# Patient Record
Sex: Female | Born: 1950 | Race: White | Marital: Single
Health system: Northeastern US, Academic
[De-identification: ages and names within clinical notes are randomized; demographics above are authoritative.]

## PROBLEM LIST (undated history)

## (undated) DIAGNOSIS — M419 Scoliosis, unspecified: Secondary | ICD-10-CM

## (undated) DIAGNOSIS — G629 Polyneuropathy, unspecified: Secondary | ICD-10-CM

## (undated) DIAGNOSIS — J45909 Unspecified asthma, uncomplicated: Secondary | ICD-10-CM

## (undated) DIAGNOSIS — J439 Emphysema, unspecified: Secondary | ICD-10-CM

## (undated) DIAGNOSIS — J449 Chronic obstructive pulmonary disease, unspecified: Secondary | ICD-10-CM

## (undated) DIAGNOSIS — F319 Bipolar disorder, unspecified: Secondary | ICD-10-CM

## (undated) HISTORY — DX: Chronic obstructive pulmonary disease, unspecified: J44.9

## (undated) HISTORY — PX: GALLBLADDER SURGERY: SHX652

## (undated) HISTORY — DX: Scoliosis, unspecified: M41.9

## (undated) HISTORY — PX: ABDOMINAL HYSTERECTOMY: SHX81

## (undated) HISTORY — PX: BACK SURGERY: SHX140

## (undated) HISTORY — DX: Polyneuropathy, unspecified: G62.9

## (undated) HISTORY — DX: Unspecified asthma, uncomplicated: J45.909

## (undated) HISTORY — DX: Bipolar disorder, unspecified: F31.9

## (undated) HISTORY — PX: GASTRIC BYPASS: SHX52

## (undated) HISTORY — PX: OTHER SURGICAL HISTORY: SHX169

## (undated) HISTORY — PX: BREAST REDUCTION SURGERY: SHX8

## (undated) HISTORY — DX: Emphysema, unspecified: J43.9

---

## 2015-10-16 NOTE — Progress Notes (Signed)
* * *        **Leah [name]**    ------    82 Y old Female, DOB: 11-30-1950    Account Number: [id number]    [address] [place], [id number]    Home: [phone]    Guarantor: Leah [name] Insurance: [facility] Complete-[employer]    Appointment Facility: Nolon Bussing DO        * * *    10/16/2015    Progress Notes: Cherre Robins, D.O.     ------    ---        Reason for Appointment     ---      1. Rash Upper and Lowerr extremities    ---      History of Present Illness    ---    [facility] Pre-Visit Prep_ :    Questions for Pre-visit Prep:    Hospitalized or ER visit since last appt? _No_    Other physicians seen since last appt? _No_    _Constitutional_ :    Leah [name] presents today for a follow up. She had blood work done for today's  visit. Patient complains of itching all over body. She believes that it has  been occurring since she has been on Jordan. She states that the itching is  all over her body, but mostly her arms though. Patient has no other concerns  or complaints today.      Current Medications    ---    Taking     * Abilify 15 MG Tablet 1 TAB Orally daily     ---    * Furosemide 80 MG Tablet 1 tablet Orally on Monday and Friday     ---    * Vitamin D 1000 UNIT Tablet TAKE 1 TABLET BY MOUTH EVERY DAY     ---    * Atenolol 50 MG Tablet TAKE 1 TABLET BY MOUTH TWICE A DAY     ---    * Amlodipine Besylate 10 MG Tablet TAKE 1 TABLET BY MOUTH DAILY     ---    * Tizanidine HCl 4 MG Tablet TAKE 1 TABLET BY MOUTH 3-4 TIMES DAILY     ---    * Klonopin 1 MG Tablet 2.5 tablet Orally Once a day     ---    * Ferrous Gluconate 325 (36 Fe) MG Tablet 1 tablet Orally Twice a day     ---    * Amitriptyline HCl 100 MG Tablet TAKE 2 TABLETS BY MOUTH AT BEDTIME     ---    * Clobetasol Propionate 0.05 % Cream 1 application to affected area Externally Twice a day, stop date 12/04/2015     ---    * Tizanidine HCl 4 MG Tablet 1 tablet as needed Orally Three to four  times a day, stop date 01/01/2016     ---    * Clonazepam 1 MG Tablet TAKE 1 TABLET BY MOUTH THREE TIMES A DAY FOR 14 DAYS     ---    * Lisinopril 40 MG Tablet TAKE 1 TABLET DAILY ORAL     ---    * Furosemide 40 MG Tablet 1 tablet Orally five days per week     ---    * Cymbalta 60 MG Capsule Delayed Release Particles TAKE ONE CAPSULE BY MOUTH EVERY DAY     ---    * HydrOXYzine HCl 25 MG Tablet 1  tablet as needed Orally every 8 hrs     ---    * Breo Ellipta 100-25 MCG/INH Aerosol Powder Breath Activated 1 puff Inhalation Once a day, stop date 01/30/2016     ---    * Oxycodone HCl 10 MG Tablet 1 tablet Orally Twice a day, stop date 11/05/2015     ---    * Latuda 40 MG Tablet 1 tablet with food Orally Once a day     ---    Not-Taking    * Pravastatin Sodium 40MG  1 TAB ORAL daily     ---    * Omeprazole 20MG  Tablet Delayed Release 1 TAB ORAL twice daily     ---    * Albuterol Sulfate (2.5 MG/3ML) 0.083% Nebulization Solution 3 ml Inhalation Three times a day     ---    * Advair Diskus 500-50 MCG/DOSE Aerosol Powder Breath Activated INHALE 1 PUFF TWICE A DAY     ---    * Abilify 10mg  tablet TAKE 1 TABLET BY MOUTH ONCE A DAY     ---    * Spiriva HandiHaler 18 MCG 1 CAP Inhalation daily     ---    * Aripiprazole 15 MG Tablet TAKE 1 TABLET BY MOUTH EVERY DAY     ---    * Zanaflex 4MG  1 TAB ORAL 2-3 times daily     ---    Discontinued    * Combivent 103-18 1INH INHALE four times daily     ---    Unknown    * Klonopin 1 MG Tablet 1 tablet Orally Three a day     ---    * Medication List reviewed and reconciled with the patient     ---      Past Medical History    ---      COPD.        ---    Bipolar.        ---    Substance abuse.        ---    Encephalopthy.        ---    Nondependent tobacco use disorder.        ---    Other and unspecified hyperlipidemia.        ---    Essential hypertension, benign.        ---    Lumbago.        ---    Esophageal reflux.        ---    Dietary surveillance and counseling.        ---     Exercise counseling.        ---    Other counseling, not elsewhere classified.        ---    Overweight.        ---    Hypertension.        ---    Hyperlipidemia.        ---    Other abnormal glucose.        ---    Fatigue.        ---    Vitamin D deficiency.        ---    Personal history of tobacco use, presenting hazards to health.        ---    COPD-Stable.        ---    Cough.        ---  Cough.        ---      Surgical History    ---      No Surgical History documented.    ---      Family History    ---      Father: deceased, lung cancer    ---    Mother: deceased, bone cancer    ---    1 brother(s) , 1 sister(s) - healthy.    ---    husband passed 10/02/14-heart attack 53yo.    ---      Social History    ---    _Quality 2017:_    Tobacco Use/Smoking Are you a: current smoker , How often do you smoke  cigarettes?: some days, but not every day, How many cigarettes a day do you  smoke?: 11-20, How soon after you wake up do you smoke your first cigarette?:  within 5 minutes, Are you interested in quitting?: Thinking about quitting.    Depression Screening (PHQ-2) Little interest or pleasure in doing things: Yes,  Feeling down, depressed, or hopeless: Yes.    Depression Evaluation (PHQ-9) Little interest or pleasure in doing things:  Nearly every day , Feeling down, depressed, or hopeless: Nearly every day,  Trouble falling or staying asleep, or sleeping too much: Nearly every day,  Feeling tired or having little energy: Nearly every day, Poor appetite or  overeating: Several days, Feeling bad about yourself, or that you are a  failure, or have let yourself or your family down: Not at all, Trouble  concentrating on things, such as reading the newspaper or watching television:  Nearly every day, Moving or speaking so slowly that other people could have  noticed. Or the opposite, being so fidgety or restless that you are moving  around a lot more than usual : Not at all, Thoughts that you would be  better  off dead, or of hurting yourself in some way: Not at all, Total Score:: 16,  Interpretation:: Moderately severe depression.    _Miscellaneous:_    Caffeine: yes, frequency:, more than 4 cups per day.    no Do you smoke Marijuana?.    Children: yes.    Community involvements: yes.    no Exercise.    Housing: renting.    Natural support system: relies on family.    Occupational exposure: none.    Others at home: none.    Pets: none.    no Sexually active.    no Travel outside of the [place].      Allergies    ---      N.K.D.A.    ---      Hospitalization/Major Diagnostic Procedure    ---      Lurlean Nanny - fall, altered mental status injury of head 09/17/2014    ---      Review of Systems    ---    _General/Constitutional_ :    Patient denies  change in appetite  ,  chills  ,  fever  ,  headache  ,  lightheadedness  ,  weight loss  . Patient complaining of  fatigue  ,  sleep  disturbance  ,  weight gain  .    _Ophthalmologic_ :    Patient denies  blurred vision  ,  diminished visual acuity  ,  discharge  ,  dry eye  ,  eye pain  ,  flashes of light in the visual field  ,  floaters in  the visual field  ,  itching and redness  ,  red eye  .    _ENT_ :    Patient denies  blocked ear(s)  ,  decreased hearing  ,  decreased sense of  smell  ,  difficulty swallowing  ,  dry mouth  ,  ear pain, snoring,  sore  throat  ,  swollen glands  .    _Endocrine_ :    Patient denies  acne  ,  cold intolerance  ,  difficulty sleeping  ,  dizziness  ,  excessive sweating  ,  excessive thirst  ,  frequent urination  ,  hair loss  ,  heat intolerance  ,  weakness  ,  weight loss  .    _Respiratory_ :    Patient denies  asthma, breathing problems  ,  chest pain  ,  cough  ,  hemoptysis  ,  pain with inspiration  ,  shortness of breath  ,  shortness of  breath at rest  ,  shortness of breath with exertion  ,  sputum production  ,  wheezing  .    _Cardiovascular_ :    Patient denies  chest pain at rest  ,  chest pain  with exertion  ,  claudication  ,  cyanosis  ,  difficulty laying flat  ,  dizziness  ,  dyspnea  on exertion  ,  fluid accumulation in the legs  ,  irregular heartbeat  ,  orthopnea  ,  palpitations  ,  rheumatic fever  ,  swelling in hands/feet  ,  weakness  ,  weight gain  .    _Gastrointestinal_ :    Patient denies  abdominal pain  ,  blood in stool  ,  change in bowel habits  ,  constipation  ,  decreased appetite  ,  diarrhea  ,  difficulty swallowing  ,  heartburn  ,  nausea  ,  rectal bleeding  ,  vomiting  ,  weight loss  .    _Genitourinary_ :    Patient denies  abdominal pain/swelling  ,  blood in the urine  ,  difficulty  urinating  ,  frequent urination  ,  pain in lower back  ,  painful urination  .    _Skin_ :    Patient denies  acne  ,  eczema  ,  excessive sun exposure  ,  hair changes  ,  masses  ,  mole(s)  ,  nail changes  ,  nodule(s)  ,  photosensitivity  ,  rash  ,  scaly lesions of skin/scalp  ,  skin lesion(s)  ,  skin oozing  .  Patient complaining of  blistering of skin  ,  discoloration  ,  dry skin  ,  hives  ,  itching  .    _Neurologic_ :    Patient denies  difficulty speaking  ,  dizziness  ,  fainting  ,  gait  abnormality  ,  headache  ,  irritability  ,  loss of strength  ,  loss of use  of extremity  ,  low back pain  ,  memory loss  ,  pain  ,  paralysis  ,  seizures  ,  stroke  ,  tics  ,  transient loss of vision  ,  tremor  .  Patient complaining  of  balance difficulty  ,  tingling/numbness  .    _Psychiatric_ :    Patient denies  difficulty sleeping  ,  loss of appetite  ,  stressors  ,  substance abuse  ,  suicidal thoughts  . Patient complaining of  anxiety  ,  depressed mood  .          Vital Signs    ---    Ht 61 in, Temp  100.4 F  , BP 102/58 mm Hg, HR  59 /min  .      Examination    ---    Denton Meek Examination_ :    NECK/THYROID:  normal  ,  carotid pulse normal  ,  neck supple, full range of  motion  ,  no carotid bruit  ,  no cervical lymphadenopathy  ,  no  jugular  venous distention  ,  no lymphadenopathy  ,  no thyroid nodules  ,  no  thyromegaly  ,  normal carotid upstroke  ,  normal jugular venous distention  ,  thyroid nontender  ,  thyroid normal  ,  trachea midline  .    SKIN:  Itchiness over body, but cellulitis occured on left arm, laterally,  from scratching  .    HEART:  normal  ,  no clicks  ,  no jugular venous distention  ,  no murmurs,  rubs, gallops  ,  point of maximal impulse normal  ,  regular rate and rhythm  .    LUNGS:  normal  ,  clear anteriorly and posteriorly  ,  clear to auscultation  bilaterally  ,  good air movement  ,  no wheezes, rales, rhonchi  .    CHEST:  normal  ,  axillary nodes normal  ,  clear to auscultation and  percussion  ,  no costochondral tenderness  ,  no gross rib deformity  ,  normal anteroposterior (AP) diameter  ,  normal shape and expansion  .    EXTREMITIES:  no edema  ,  normal. right upper lateral leg pain and numbness  in the back. pain down bilateral legs to the ankles posteriorly.  .    On exam, the blood pressure was 94/60 on the right arm.          Assessments    ---    1. Hypertension - I10 (Primary)    ---    2. Hyperlipidemia - E78.5    ---    3. Fe deficiency anemia - D50.9    ---    4. B12 deficiency - E53.8    ---    5. Hyperglycemia - R73.9    ---    6. Cellulitis, unspecified cellulitis site - L03.90    ---    7. Lumbago with sciatica, left side - M54.42    ---    8. Lumbago with sciatica, right side - M54.41    ---    9. Pain in right lower leg - M79.661    ---    10. Pain in left lower leg - M79.662    ---    11. History of tobacco use - Z87.891    ---    12. Screening for colon cancer - Z12.11    ---    13. History of back surgery - Z98.890    ---    14. Hx: bad fall - Z91.81    ---  15. Mood disorder - F39    ---      Treatment    ---      **1. Hypertension**    _LAB: B12_    _LAB: CBC_    _LAB: COMPREHENSIVE METABOLIC PANEL_    _LAB: FE_    _LAB: HEMOGLOBIN A1C_    _LAB: LIPID PROFILE_     _LAB: TSH_    _LAB: VITAMIN D (25 HYDROXY)_    Notes: Blood pressure is low today and has been on the lower end for several  visits. I have advised patient to cut her Lisinopril dose in half. We will  recheck blood pressure in one month.    The CBC is stable. The WBC is good. The platelets are good. No anemia.    The Kidney functions, Electrolytes, and Liver functions are good.    ---        **2. Hyperlipidemia**    _LAB: B12_    _LAB: CBC_    _LAB: COMPREHENSIVE METABOLIC PANEL_    _LAB: FE_    _LAB: HEMOGLOBIN A1C_    _LAB: LIPID PROFILE_    _LAB: TSH_    _LAB: VITAMIN D (25 HYDROXY)_    Notes: The Total Cholesterol is high at 211 (should be &lt;200), the  Triglycerides are high at 284 (should be &lt;150), the HDL is good at 48  (should be &gt;40), and the LDL is good at 106 (should be &lt;130).  Patient had stopped taking the Pravastatin, which is possibly why the  cholesterol levels are out of range. She will restart taking the Pravastatin.  She has been advised to have low fat/cholesterol in diet and do regular  exercise in order to improve the cholesterol.        **3. Fe deficiency anemia**    _LAB: B12_    _LAB: CBC_    _LAB: COMPREHENSIVE METABOLIC PANEL_    _LAB: FE_    _LAB: HEMOGLOBIN A1C_    _LAB: LIPID PROFILE_    _LAB: TSH_    _LAB: VITAMIN D (25 HYDROXY)_    Notes: The Fe is good at 108 (should be 50-170). She is taking Ferrous  Gluconate to help with the Fe deficiency anemia.        **4. B12 deficiency**    _LAB: B12_    _LAB: CBC_    _LAB: COMPREHENSIVE METABOLIC PANEL_    _LAB: FE_    _LAB: HEMOGLOBIN A1C_    _LAB: LIPID PROFILE_    _LAB: TSH_    _LAB: VITAMIN D (25 HYDROXY)_    Notes: The B12 level is a good at 707 (should be 213-816).        **5. Hyperglycemia**    _LAB: B12_    _LAB: CBC_    _LAB: COMPREHENSIVE METABOLIC PANEL_    _LAB: FE_    _LAB: HEMOGLOBIN A1C_    _LAB: LIPID PROFILE_    _LAB: TSH_    _LAB: VITAMIN D (25 HYDROXY)_    Notes: The fasting glucose is 113 and the A1c is 5.1%.  Patient has been  advised to continue watching sugar/carbohydrate in diet and to do regular  exercise to keep the blood glucose levels under control in order to prevent  the onset of Diabetes.        **6. Cellulitis, unspecified cellulitis site**    Start Keflex Capsule, 250 MG, 1 capsule, Orally, four times a day, 07 days,  28, Refills 0    Start Bactroban Ointment, 2 %, 1 application to affected  area, Externally,  Three times a day, 30 days, 30, Refills 1    Notes: Due to the itching all over her body, especially on her arms, since  taking the Latuda, patient seems to have some cellulitis on her left arm. She  will start taking Keflex 250mg  QID for 7 days to clear the infection. She has  also been given the bactroban ointment to apply externally to the area.        **7. Lumbago with sciatica, left side**    _IMAGING: MRI -LUMBAR SPINE [facility]_    _IMAGING: EMG Bilateral Legs_    Notes: Patient will get an MRI of Lumbar Spine and EMG of bilateral legs in  order to assess the low back pain radiating in to both legs.        **8. Lumbago with sciatica, right side**    _IMAGING: MRI -LUMBAR SPINE [facility]_    _IMAGING: EMG Bilateral Legs_    Notes: Patient will get an MRI of Lumbar Spine and EMG of bilateral legs in  order to assess the low back pain radiating in to both legs.        **9. Pain in right lower leg**    _IMAGING: MRI -LUMBAR SPINE [facility]_    _IMAGING: EMG Bilateral Legs_        **10. Pain in left lower leg**    _IMAGING: MRI -LUMBAR SPINE [facility]_    _IMAGING: EMG Bilateral Legs_        **11. History of tobacco use**    _IMAGING: CT -LUNG SCREENING LOW DOSE_    Notes: Patient will get a screening CT of Lungs due to history of smoking.        **12. Screening for colon cancer**    _IMAGING: Colonoscopy_    Notes: Patient is due to get a colonoscopy.        **13. History of back surgery**    _IMAGING: MRI -LUMBAR SPINE [facility]_    _IMAGING: EMG Bilateral Legs_        **14. Hx: bad fall**    _IMAGING: MRI -LUMBAR SPINE [facility]_     _IMAGING: EMG Bilateral Legs_        **15. Mood disorder**    Notes: If it is in fact the Latuda causing the itchiness, we will consider  switching her to Lamictal or Trileptal.        **16. Others**    Notes: The TSH is good at 2.9 (should be 0.35-4.94)    The Vitamin D level is low at 23.8 (should be 32-100). Patient is already  taking 1000IU of Vitamin D, but she has been advised to increase it to 3000IU  daily.    No changes have been made to patients current medications    Patient has been given a lab slip to get blood work done before her next visit  in one month.      Follow Up    ---    4 Weeks    Electronically signed by Nolon Bussing , DO on 02/20/2016 at 09:08 PM  CDT    Sign off status: Completed        * * *        Nolon Bussing DO    [address]    [place], [place] [id number]    Tel: [phone]    Fax: [phone]              * * *  Patient: Leah [name], Leah [name] DOB: 03/20/1951 Progress Note: Cherre Robins, D.O. 10/16/2015    ---    Note generated by eClinicalWorks EMR/PM Software ([email or web address])

## 2015-12-11 ENCOUNTER — Ambulatory Visit

## 2015-12-11 LAB — HX PSC EKG LAB: CASE NUMBER: 2017159000852

## 2016-01-02 NOTE — Progress Notes (Signed)
* * *        **Leah [name]**    ------    65 Y old Female, DOB: 04-05-1951    Account Number: [id number]    [address] [place], [id number]    Home: [phone]    Guarantor: Leah [name] Insurance: [facility] Complete-[employer]    Appointment Facility: Nolon Bussing DO        * * *    01/02/2016    Progress Notes: Cherre Robins, D.O.     ------    ---        Reason for Appointment     ---      1. 1 month f/u    ---      History of Present Illness    ---    _Constitutional_ :    Leah [name] presents today for a follow up. She states that her mood has been  better. She is on Latuda 60mg  QD and on 50mg  of Amitriptyline, and is off the  Abilify. She also mentions that she has no vision in the right eye still from  her fall. Patient has no other concerns or complaints today.      Current Medications    ---    Taking     * Furosemide 80 MG Tablet 1 tablet Orally on Monday and Friday     ---    * Vitamin D 1000 UNIT Tablet TAKE 1 TABLET BY MOUTH EVERY DAY     ---    * Amlodipine Besylate 10 MG Tablet TAKE 1 TABLET BY MOUTH DAILY     ---    * Klonopin 1 MG Tablet 2.5 tablet Orally Once a day     ---    * Clonazepam 1 MG Tablet TAKE 1 TABLET BY MOUTH THREE TIMES A DAY FOR 14 DAYS     ---    * Lisinopril 40 MG Tablet TAKE 1 TABLET DAILY ORAL     ---    * Cymbalta 60 MG Capsule Delayed Release Particles TAKE ONE CAPSULE BY MOUTH EVERY DAY     ---    * Breo Ellipta 100-25 MCG/INH Aerosol Powder Breath Activated 1 puff Inhalation Once a day, stop date 01/30/2016     ---    * Latuda 60 MG Tablet 2 tablets with food 1 40 mg and 1 20 mg = 60 mg Orally Once a day     ---    * Ferrous Gluconate 325 (36 Fe) MG Tablet 1 tablet Orally Twice a day     ---    * Amitriptyline HCl 50 MG Tablet TAKE 2 TABLETS BY MOUTH AT BEDTIME Orally     ---    * Atenolol 50 MG Tablet TAKE 1 TABLET BY MOUTH TWICE A DAY     ---    * HydrOXYzine HCl 25 MG Tablet 1 tablet as needed Orally every 8  hrs     ---    * Furosemide 40 MG Tablet TAKE 1 TABLET BY MOUTH DAILY     ---    * Omeprazole 20 MG Capsule Delayed Release TAKE ONE CAPSULE BY MOUTH TWICE A DAY     ---    * Oxycodone HCl 10 MG Tablet 1 tablet Orally Twice a day, stop date 01/03/2016     ---    * Tizanidine HCl 4 MG Tablet 1 tablet as needed Orally Three to four times a  day     ---    Not-Taking    * Abilify 15 MG Tablet 1 TAB Orally daily     ---    * Pravastatin Sodium 40MG  1 TAB ORAL daily     ---    * Omeprazole 20MG  Tablet Delayed Release 1 TAB ORAL twice daily     ---    * Albuterol Sulfate (2.5 MG/3ML) 0.083% Nebulization Solution 3 ml Inhalation Three times a day     ---    * Advair Diskus 500-50 MCG/DOSE Aerosol Powder Breath Activated INHALE 1 PUFF TWICE A DAY     ---    * Abilify 10mg  tablet TAKE 1 TABLET BY MOUTH ONCE A DAY     ---    * Spiriva HandiHaler 18 MCG 1 CAP Inhalation daily     ---    * Aripiprazole 15 MG Tablet TAKE 1 TABLET BY MOUTH EVERY DAY     ---    * Zanaflex 4MG  1 TAB ORAL 2-3 times daily     ---    Unknown    * Klonopin 1 MG Tablet 1 tablet Orally Three a day     ---    * Medication List reviewed and reconciled with the patient     ---      Past Medical History    ---      COPD.        ---    Bipolar.        ---    Substance abuse.        ---    Encephalopthy.        ---    Nondependent tobacco use disorder.        ---    Other and unspecified hyperlipidemia.        ---    Essential hypertension, benign.        ---    Lumbago.        ---    Esophageal reflux.        ---    Dietary surveillance and counseling.        ---    Exercise counseling.        ---    Other counseling, not elsewhere classified.        ---    Overweight.        ---    Hypertension.        ---    Hyperlipidemia.        ---    Other abnormal glucose.        ---    Fatigue.        ---    Vitamin D deficiency.        ---    Personal history of tobacco use, presenting hazards to health.        ---    COPD-Stable.        ---    Cough.        ---     Cough.        ---      Surgical History    ---      No Surgical History documented.    ---      Family History    ---      Father: deceased, lung cancer    ---    Mother: deceased, bone cancer    ---    1 brother(s) , 1 sister(s) - healthy.    ---  husband passed 10/02/14-heart attack 53yo.    ---      Social History    ---    _Quality 2017:_    Tobacco Use/Smoking Are you a: current smoker , How often do you smoke  cigarettes?: some days, but not every day, How many cigarettes a day do you  smoke?: 11-20, How soon after you wake up do you smoke your first cigarette?:  within 5 minutes, Are you interested in quitting?: Thinking about quitting.    Depression Screening (PHQ-2) Little interest or pleasure in doing things: Yes,  Feeling down, depressed, or hopeless: Yes.    Depression Evaluation (PHQ-9) Little interest or pleasure in doing things:  Nearly every day , Feeling down, depressed, or hopeless: Nearly every day,  Trouble falling or staying asleep, or sleeping too much: Nearly every day,  Feeling tired or having little energy: Nearly every day, Poor appetite or  overeating: Several days, Feeling bad about yourself, or that you are a  failure, or have let yourself or your family down: Not at all, Trouble  concentrating on things, such as reading the newspaper or watching television:  Nearly every day, Moving or speaking so slowly that other people could have  noticed. Or the opposite, being so fidgety or restless that you are moving  around a lot more than usual : Not at all, Thoughts that you would be better  off dead, or of hurting yourself in some way: Not at all, Total Score:: 16,  Interpretation:: Moderately severe depression.      Allergies    ---      N.K.D.A.    ---      Hospitalization/Major Diagnostic Procedure    ---      Lurlean Nanny - fall, altered mental status injury of head 09/17/2014    ---      Review of Systems    ---    _General/Constitutional_ :    Patient denies  change in  appetite  ,  chills  ,  fatigue  ,  fever  ,  headache  ,  lightheadedness  ,  sleep disturbance  ,  weight gain  ,  weight  loss  .    _Ophthalmologic_ :    Patient denies  blurred vision  ,  diminished visual acuity  ,  discharge  ,  dry eye  ,  eye pain  ,  flashes of light in the visual field  ,  floaters in  the visual field  ,  itching and redness  ,  red eye  .    _ENT_ :    Patient denies  blocked ear(s)  ,  decreased hearing  ,  decreased sense of  smell  ,  difficulty swallowing  ,  dry mouth  ,  ear pain, snoring,  sore  throat  ,  swollen glands  .    _Endocrine_ :    Patient denies  acne  ,  cold intolerance  ,  difficulty sleeping  ,  dizziness  ,  excessive sweating  ,  excessive thirst  ,  frequent urination  ,  hair loss  ,  heat intolerance  ,  weakness  ,  weight loss  .    _Respiratory_ :    Patient denies  asthma, breathing problems  ,  chest pain  ,  cough  ,  hemoptysis  ,  pain with inspiration  ,  shortness of breath  ,  shortness of  breath at rest  ,  shortness of breath with exertion  ,  sputum production  ,  wheezing  .    _Cardiovascular_ :    Patient denies  chest pain at rest  ,  chest pain with exertion  ,  claudication  ,  cyanosis  ,  difficulty laying flat  ,  dizziness  ,  dyspnea  on exertion  ,  fluid accumulation in the legs  ,  irregular heartbeat  ,  orthopnea  ,  palpitations  ,  rheumatic fever  ,  swelling in hands/feet  ,  weakness  ,  weight gain  .    _Gastrointestinal_ :    Patient denies  abdominal pain  ,  blood in stool  ,  change in bowel habits  ,  constipation  ,  decreased appetite  ,  diarrhea  ,  difficulty swallowing  ,  heartburn  ,  nausea  ,  rectal bleeding  ,  vomiting  ,  weight loss  .    _Genitourinary_ :    Patient denies  abdominal pain/swelling  ,  blood in the urine  ,  difficulty  urinating  ,  frequent urination  ,  pain in lower back  ,  painful urination  .    _Skin_ :    Patient denies  acne  ,  blistering of skin  ,  discoloration  ,  dry  skin  ,  eczema  ,  excessive sun exposure  ,  hair changes  ,  hives  ,  itching  ,  masses  ,  mole(s)  ,  nail changes  ,  nodule(s)  ,  photosensitivity  ,  rash  ,  scaly lesions of skin/scalp  ,  skin lesion(s)  ,  skin oozing  .    _Neurologic_ :    Patient denies  balance difficulty  ,  difficulty speaking  ,  dizziness  ,  fainting  ,  gait abnormality  ,  headache  ,  irritability  ,  loss of  strength  ,  loss of use of extremity  ,  low back pain  ,  memory loss  ,  pain  ,  paralysis  ,  seizures  ,  stroke  ,  tics  ,  tingling/numbness  ,  transient loss of vision  ,  tremor  .    _Psychiatric_ :    Patient denies  anxiety  ,  depressed mood  ,  difficulty sleeping  ,  loss of  appetite  ,  stressors  ,  substance abuse  ,  suicidal thoughts  .          Vital Signs    ---    Ht 61 in, Temp 97.9 F, BP 104/58 mm Hg, HR 67 /min, Oxygen sat % 98 %.      Examination    ---    _General Examination_ :    NECK/THYROID:  normal  ,  carotid pulse normal  ,  neck supple, full range of  motion  ,  no carotid bruit  ,  no cervical lymphadenopathy  ,  no jugular  venous distention  ,  no lymphadenopathy  ,  no thyroid nodules  ,  no  thyromegaly  ,  normal carotid upstroke  ,  normal jugular venous distention  ,  thyroid nontender  ,  thyroid normal  ,  trachea midline  .    HEART:  normal other than 1-2/6 murmur  ,  no clicks  ,  no jugular venous  distention  ,  no rubs, gallops  ,  point of maximal impulse normal  ,  regular rate and rhythm  .    LUNGS:  normal  ,  clear anteriorly and posteriorly  ,  clear to auscultation  bilaterally  ,  good air movement  ,  no wheezes, rales, rhonchi  .    CHEST:  normal  ,  axillary nodes normal  ,  clear to auscultation and  percussion  ,  no costochondral tenderness  ,  no gross rib deformity  ,  normal anteroposterior (AP) diameter  ,  normal shape and expansion  .    EXTREMITIES:  no edema  ,  normal  .    On exam, the blood pressure was 118/66 on the right arm.           Assessments    ---    1. Hypertension - I10 (Primary)    ---    2. Vision loss, right eye - H54.61    ---    3. Mood disorder - F39    ---      Treatment    ---      **1. Hypertension**    Notes: Blood pressure is good and within normal range.    ---        **2. Vision loss, right eye**    _IMAGING: Echocardiogram_    _IMAGING: VASCULAR -BILATERAL CAROTID WITH S/A_    _IMAGING: EKG_    Notes: Patient had an appointment with ophthalmology. They believe it could be  her retinal nerve has severed when she fell. Patient will get a carotid  ultrasound, echocardiogram, and EKG to assess the vision loss.        **3. Mood disorder**    Notes: Patients mood has been good on the Latuda and Amitriptyline. She is off  of the Abilify.        **4. Others**    Start Oxycodone HCl Tablet, 10 MG, 1 tablet, Orally, Twice a day, 30 days, 60,  Refills 0      Follow Up    ---    4 Weeks    Electronically signed by Nolon Bussing , DO on 02/20/2016 at 09:18 PM  CDT    Sign off status: Completed        * * *        Nolon Bussing DO    [address]    [place], [place] [id number]    Tel: [phone]    Fax: [phone]              * * *          Patient: Leah [name], Leah [name] DOB: [birth year] Progress Note: Cherre Robins, D.O. 01/02/2016    ---    Note generated by eClinicalWorks EMR/PM Software ([email or web address])

## 2016-02-13 NOTE — Progress Notes (Signed)
* * *        **Leah [name]**    ------    65 Y old Female, DOB: 10-Oct-1950    Account Number: [id number]    [address] [place], [id number]    Home: [phone]    Guarantor: Leah [name] Insurance: Susa Simmonds Medicare Complete-[employer]    Appointment Facility: Nolon Bussing DO        * * *    02/13/2016    Progress Notes: Cherre Robins, D.O.     ------    ---        **Reason for Appointment**    ---      1. 1 month f/u    ---      **History of Present Illness**    ---    _Constitutional_ :    Leah [name] presents today for a one-month follow up. She was not able to have any  blood work done for today's visit. Patient states that her mood has been  better on the [place]. She would like to slightly increase the dose though.  Other than that, she complains of feeling fatigued. Patient also complains of  having bilateral foot pain. She has no other concerns or complaints today.      **Current Medications**    ---    Taking     * Furosemide 80 MG Tablet 1 tablet Orally on Monday and Friday     ---    * Vitamin D 1000 UNIT Tablet TAKE 1 TABLET BY MOUTH EVERY DAY     ---    * Amlodipine Besylate 10 MG Tablet TAKE 1 TABLET BY MOUTH DAILY     ---    * Klonopin 1 MG Tablet 1 tab in AM, 0.5 tab in Noon, and 1 tab in PM Orally daily     ---    * Latuda 80 MG Tablet 1 tablet Orally Once a day     ---    * Ferrous Gluconate 325 (36 Fe) MG Tablet 1 tablet Orally Twice a day     ---    * Amitriptyline HCl 50 MG Tablet 1 tablet Orally     ---    * Omeprazole 20 MG Capsule Delayed Release TAKE ONE CAPSULE BY MOUTH TWICE A DAY     ---    * Tizanidine HCl 4 MG Tablet 1 tablet as needed Orally Three to four times a day     ---    * HydrOXYzine HCl 25 MG Tablet 1 tablet as needed Orally every 8 hrs     ---    * Atenolol 50 MG Tablet 1 tablet Orally Twice a day     ---    * Lisinopril 40 MG Tablet TAKE 1 TABLET DAILY ORAL     ---    * Cymbalta 60 MG Capsule Delayed Release Particles  TAKE ONE CAPSULE BY MOUTH EVERY DAY     ---    * Oxycodone HCl 10 MG Tablet 1 tablet Orally Twice a day, stop date 02/28/2016     ---    Not-Taking    * Abilify 15 MG Tablet 1 TAB Orally daily     ---    * Pravastatin Sodium 40MG  1 TAB ORAL daily     ---    * Omeprazole 20MG  Tablet Delayed Release 1 TAB ORAL twice daily     ---    * Albuterol Sulfate (2.5 MG/3ML)  0.083% Nebulization Solution 3 ml Inhalation Three times a day     ---    * Advair Diskus 500-50 MCG/DOSE Aerosol Powder Breath Activated INHALE 1 PUFF TWICE A DAY     ---    * Abilify 10mg  tablet TAKE 1 TABLET BY MOUTH ONCE A DAY     ---    * Spiriva HandiHaler 18 MCG 1 CAP Inhalation daily     ---    * Aripiprazole 15 MG Tablet TAKE 1 TABLET BY MOUTH EVERY DAY     ---    * Zanaflex 4MG  1 TAB ORAL 2-3 times daily     ---    Discontinued    * Breo Ellipta 100-25 MCG/INH Aerosol Powder Breath Activated 1 puff Inhalation Once a day     ---    Unknown    * Klonopin 1 MG Tablet 1 tablet Orally Three a day     ---    * Medication List reviewed and reconciled with the patient     ---      **Past Medical History**    ---      COPD.        ---    Bipolar.        ---    Substance abuse.        ---    Encephalopthy.        ---    Nondependent tobacco use disorder.        ---    Other and unspecified hyperlipidemia.        ---    Essential hypertension, benign.        ---    Lumbago.        ---    Esophageal reflux.        ---    Dietary surveillance and counseling.        ---    Exercise counseling.        ---    Other counseling, not elsewhere classified.        ---    Overweight.        ---    Hypertension.        ---    Hyperlipidemia.        ---    Other abnormal glucose.        ---    Fatigue.        ---    Vitamin D deficiency.        ---    Personal history of tobacco use, presenting hazards to health.        ---    COPD-Stable.        ---    Cough.        ---    Cough.        ---      **Surgical History**    ---      No Surgical History documented.    ---       **Family History**    ---      Father: deceased, lung cancer    ---    Mother: deceased, bone cancer    ---    1 brother(s) , 1 sister(s) - healthy.    ---    husband passed 10/02/14-heart attack 53yo.    ---      **Social History**    ---    _Quality 2017:_    Tobacco Use/Smoking    Are you a  _current smoker_    How often do you smoke cigarettes? _some days, but not every day_    How many cigarettes a day do you smoke? _11-20_    How soon after you wake up do you smoke your first cigarette? _within 5  minutes_    Are you interested in quitting? _Thinking about quitting_    Depression Evaluation (PHQ-9)    Little interest or pleasure in doing things _Nearly every day_    Feeling down, depressed, or hopeless _Nearly every day_    Trouble falling or staying asleep, or sleeping too much _Nearly every day_    Feeling tired or having little energy _Nearly every day_    Poor appetite or overeating _Several days_    Feeling bad about yourself, or that you are a failure, or have let yourself or  your family down _Not at all_    Trouble concentrating on things, such as reading the newspaper or watching  television _Nearly every day_    Moving or speaking so slowly that other people could have noticed. Or the  opposite, being so fidgety or restless that you are moving around a lot more  than usual _Not at all_    Thoughts that you would be better off dead, or of hurting yourself in some way  _Not at all_    Total Score: _16_    Interpretation: _Moderately severe depression_    Depression Screening (PHQ-2)    Little interest or pleasure in doing things _Yes_    Feeling down, depressed, or hopeless _Yes_      **Allergies**    ---      N.K.D.A.    ---      **Hospitalization/Major Diagnostic Procedure**    ---      [facility] - fall, altered mental status injury of head 09/17/2014    ---      **Review of Systems**    ---    _General/Constitutional_ :    Patient denies  change in appetite  ,  chills  ,  fatigue  ,   fever  ,  headache  ,  lightheadedness  ,  sleep disturbance  ,  weight gain  ,  weight  loss  .    _Ophthalmologic_ :    Patient denies  blurred vision  ,  diminished visual acuity  ,  discharge  ,  dry eye  ,  eye pain  ,  flashes of light in the visual field  ,  floaters in  the visual field  ,  itching and redness  ,  red eye  .    _ENT_ :    Patient denies  blocked ear(s)  ,  decreased hearing  ,  decreased sense of  smell  ,  difficulty swallowing  ,  dry mouth  ,  ear pain, snoring,  sore  throat  ,  swollen glands  .    _Endocrine_ :    Patient denies  acne  ,  cold intolerance  ,  difficulty sleeping  ,  dizziness  ,  excessive sweating  ,  excessive thirst  ,  frequent urination  ,  hair loss  ,  heat intolerance  ,  weakness  ,  weight loss  .    _Respiratory_ :    Patient denies  asthma, breathing problems  ,  chest pain  ,  cough  ,  hemoptysis  ,  pain with inspiration  ,  shortness of breath  ,  shortness of  breath at rest  ,  shortness of breath with exertion  ,  sputum production  ,  wheezing  .    _Cardiovascular_ :    Patient denies  chest pain at rest  ,  chest pain with exertion  ,  claudication  ,  cyanosis  ,  difficulty laying flat  ,  dizziness  ,  dyspnea  on exertion  ,  fluid accumulation in the legs  ,  irregular heartbeat  ,  orthopnea  ,  palpitations  ,  rheumatic fever  ,  swelling in hands/feet  ,  weakness  ,  weight gain  .    _Gastrointestinal_ :    Patient denies  abdominal pain  ,  blood in stool  ,  change in bowel habits  ,  constipation  ,  decreased appetite  ,  diarrhea  ,  difficulty swallowing  ,  heartburn  ,  nausea  ,  rectal bleeding  ,  vomiting  ,  weight loss  .    _Genitourinary_ :    Patient denies  abdominal pain/swelling  ,  blood in the urine  ,  difficulty  urinating  ,  frequent urination  ,  pain in lower back  ,  painful urination  .    _Skin_ :    Patient denies  acne  ,  blistering of skin  ,  discoloration  ,  dry skin  ,  eczema  ,  excessive sun  exposure  ,  hair changes  ,  hives  ,  itching  ,  masses  ,  mole(s)  ,  nail changes  ,  nodule(s)  ,  photosensitivity  ,  rash  ,  scaly lesions of skin/scalp  ,  skin lesion(s)  ,  skin oozing  .    _Neurologic_ :    Patient denies  balance difficulty  ,  difficulty speaking  ,  dizziness  ,  fainting  ,  gait abnormality  ,  headache  ,  irritability  ,  loss of  strength  ,  loss of use of extremity  ,  low back pain  ,  memory loss  ,  pain  ,  paralysis  ,  seizures  ,  stroke  ,  tics  ,  tingling/numbness  ,  transient loss of vision  ,  tremor  .    _Psychiatric_ :    Patient denies  anxiety  ,  depressed mood  ,  difficulty sleeping  ,  loss of  appetite  ,  stressors  ,  substance abuse  ,  suicidal thoughts  .          **Vital Signs**    ---    Ht 61 in, BP 128/82 mm Hg, HR 73 /min, Oxygen sat % 98 %, Ht-cm 154.94 cm.      **Examination**    ---    _General Examination_ :    NECK/THYROID:  normal  ,  carotid pulse normal  ,  neck supple, full range of  motion  ,  no carotid bruit  ,  no cervical lymphadenopathy  ,  no jugular  venous distention  ,  no lymphadenopathy  ,  no thyroid nodules  ,  no  thyromegaly  ,  normal carotid upstroke  ,  normal jugular venous distention  ,  thyroid nontender  ,  thyroid normal  ,  trachea midline  .    SKIN:  black and blue areas from bruising all over arm  .    HEART:  normal other than 1-2/6 murmur  ,  no clicks  ,  no jugular venous  distention  ,  no rubs, gallops  ,  point of maximal impulse normal  ,  regular rate and rhythm  .    LUNGS:  normal  ,  clear anteriorly and posteriorly  ,  clear to auscultation  bilaterally  ,  good air movement  ,  no wheezes, rales, rhonchi  .    CHEST:  normal  ,  axillary nodes normal  ,  clear to auscultation and  percussion  ,  no costochondral tenderness  ,  no gross rib deformity  ,  normal anteroposterior (AP) diameter  ,  normal shape and expansion  .    EXTREMITIES:  no edema  ,  normal  .          **Assessments**     ---    1. Hypertension - I10 (Primary)    ---    2. Hyperlipidemia - E78.5    ---    3. Fe deficiency anemia - D50.9    ---    4. B12 deficiency - E53.8    ---    5. Hyperglycemia - R73.9    ---    6. Vitamin D deficiency, unspecified - E55.9    ---    7. Fatigue, unspecified type - R53.83    ---    8. Snoring - R06.83    ---    9. Easy bruising - R23.8    ---    10. Transaminasemia - R74.0    ---    11. Mood disorder - F39    ---      **Treatment**    ---      **1. Hypertension**    _LAB: Prothrombin Time with INR (PT/INR)_    _LAB: B12_    _LAB: CBC_    _LAB: COMPREHENSIVE METABOLIC PANEL_    _LAB: FE_    _LAB: HEPATITIS B SURF ANTIGEN_    _LAB: HEPATITIS C_    _LAB: HEMOGLOBIN A1C_    _LAB: LIPID PROFILE_    _LAB: TSH_    _LAB: VITAMIN D (25 HYDROXY)_    Notes:  Blood pressure is good and within normal range.    ---        **2. Hyperlipidemia**    _LAB: Prothrombin Time with INR (PT/INR)_    _LAB: B12_    _LAB: CBC_    _LAB: COMPREHENSIVE METABOLIC PANEL_    _LAB: FE_    _LAB: HEPATITIS B SURF ANTIGEN_    _LAB: HEPATITIS C_    _LAB: HEMOGLOBIN A1C_    _LAB: LIPID PROFILE_    _LAB: TSH_    _LAB: VITAMIN D (25 HYDROXY)_        **3. Fe deficiency anemia**    _LAB: Prothrombin Time with INR (PT/INR)_    _LAB: B12_    _LAB: CBC_    _LAB: COMPREHENSIVE METABOLIC PANEL_    _LAB: FE_    _LAB: HEPATITIS B SURF ANTIGEN_    _LAB: HEPATITIS C_    _LAB: HEMOGLOBIN A1C_    _LAB: LIPID PROFILE_    _LAB: TSH_    _LAB: VITAMIN D (25 HYDROXY)_        **4. B12 deficiency**    _LAB:  Prothrombin Time with INR (PT/INR)_    _LAB: B12_    _LAB: CBC_    _LAB: COMPREHENSIVE METABOLIC PANEL_    _LAB: FE_    _LAB: HEPATITIS B SURF ANTIGEN_    _LAB: HEPATITIS C_    _LAB: HEMOGLOBIN A1C_    _LAB: LIPID PROFILE_    _LAB: TSH_    _LAB: VITAMIN D (25 HYDROXY)_        **5. Hyperglycemia**    _LAB: Prothrombin Time with INR (PT/INR)_    _LAB: B12_    _LAB: CBC_    _LAB: COMPREHENSIVE METABOLIC PANEL_    _LAB: FE_    _LAB: HEPATITIS B SURF  ANTIGEN_    _LAB: HEPATITIS C_    _LAB: HEMOGLOBIN A1C_    _LAB: LIPID PROFILE_    _LAB: TSH_    _LAB: VITAMIN D (25 HYDROXY)_    Notes:  Patient has been advised to continue watching sugar/carbohydrate in  diet and to do regular exercise to keep the blood glucose levels under  control.  .        **6. Vitamin D deficiency, unspecified**    _LAB: Prothrombin Time with INR (PT/INR)_    _LAB: B12_    _LAB: CBC_    _LAB: COMPREHENSIVE METABOLIC PANEL_    _LAB: FE_    _LAB: HEPATITIS B SURF ANTIGEN_    _LAB: HEPATITIS C_    _LAB: HEMOGLOBIN A1C_    _LAB: LIPID PROFILE_    _LAB: TSH_    _LAB: VITAMIN D (25 HYDROXY)_        **7. Fatigue, unspecified type**    _LAB: Prothrombin Time with INR (PT/INR)_    _LAB: B12_    _LAB: CBC_    _LAB: COMPREHENSIVE METABOLIC PANEL_    _LAB: FE_    _LAB: HEPATITIS B SURF ANTIGEN_    _LAB: HEPATITIS C_    _LAB: HEMOGLOBIN A1C_    _LAB: LIPID PROFILE_    _LAB: TSH_    _LAB: VITAMIN D (25 HYDROXY)_    _IMAGING: Sleep Study_    Notes:  Patient has been given a lab slip to get some blood work done to  assess her fatigue. She will also get a sleep study to assess this too.  .        **8. Snoring**    _IMAGING: Sleep Study_    Notes:  Patient will get a sleep study to assess her snoring and r/o possible  sleep apnea  .        **9. Easy bruising**    _LAB: Prothrombin Time with INR (PT/INR)_    _LAB: B12_    _LAB: CBC_    _LAB: COMPREHENSIVE METABOLIC PANEL_    _LAB: FE_    _LAB: HEPATITIS B SURF ANTIGEN_    _LAB: HEPATITIS C_    _LAB: HEMOGLOBIN A1C_    _LAB: LIPID PROFILE_    _LAB: TSH_    _LAB: VITAMIN D (25 HYDROXY)_    _IMAGING: Ultrasound : Abdomen_    Notes:  Patient has a lot of bruising on both of her arms. She has been given  a lab slip to get some blood work done to assess this. In addition, she will  get an abdominal ultrasound to assess this.  .        **10. Transaminasemia**    _IMAGING: Ultrasound : Abdomen_    Notes:  Patient will be getting an abdominal ultrasound to assess her  easy  bruising. This will also look at her liver to assess her  transaminasema.  .        **11. Mood disorder**    Notes:  Patient's mood has been good. The Kasandra Knudsen is working for her. However,  she would like to increase the dose slightly for a better mood. Therefore, she  will increase the dose from 80mg  to 100mg .  .        **12. 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 We  will continue to monitor this.        Patient has been given a lab slip to get routine blood work done    .      **Follow Up**    ---    4 Weeks    Electronically signed by Nolon Bussing , DO on 01/08/2018 at 12:39 PM  CDT    Sign off status: Completed        * * *        Nolon Bussing DO    [address]    [place], [place] [id number]    Tel: [phone]    Fax: [phone]              * * *          Patient: [place], [place] DOB: December 16, 1950 Progress Note: Cherre Robins, D.O. 02/13/2016    ---    Note generated by eClinicalWorks EMR/PM Software ([email or web address])

## 2016-06-15 ENCOUNTER — Ambulatory Visit: Admitting: Emergency Medicine

## 2016-06-15 ENCOUNTER — Inpatient Hospital Stay
Admit: 2016-06-15 | Disposition: A | Discharge status: Home Health | Source: Home / Self Care | Attending: Internal Medicine | Admitting: Internal Medicine

## 2016-06-15 LAB — HX COMPREHENSIVE METABOLIC PANEL
CASE NUMBER: 2017346001301
HX ALBUMIN LVL: 2.2 g/dL — ABNORMAL LOW (ref 3.5–5.0)
HX ALKALINE PHOSPHATASE: 234 U/L — ABNORMAL HIGH (ref 45.0–117.0)
HX ALT: 41 U/L — NL (ref 6.0–78.0)
HX ANION GAP: 10 — NL (ref 3.0–11.0)
HX AST: 34 U/L — NL (ref 6.0–40.0)
HX BILIRUBIN TOTAL: 1.1 mg/dL — NL (ref 0.2–1.2)
HX BUN: 32 mg/dL — ABNORMAL HIGH (ref 7.0–18.0)
HX CALCIUM LVL: 7.8 mg/dL — ABNORMAL LOW (ref 8.5–10.1)
HX CHLORIDE: 96 mmol/L — ABNORMAL LOW (ref 98.0–110.0)
HX CO2: 32 mmol/L — NL (ref 21.0–32.0)
HX CREATININE: 2.32 mg/dL — ABNORMAL HIGH (ref 0.55–1.3)
HX GLUCOSE LVL: 123 mg/dL — ABNORMAL HIGH (ref 65.0–110.0)
HX POTASSIUM LVL: 4.3 mmol/L — NL (ref 3.6–5.2)
HX SODIUM LVL: 138 mmol/L — NL (ref 136.0–145.0)
HX TOTAL PROTEIN: 5.5 g/dL — ABNORMAL LOW (ref 6.0–8.0)

## 2016-06-15 LAB — HX CBC W/ DIFF
CASE NUMBER: 2017346001301
HX HCT: 22.8 % — ABNORMAL LOW (ref 36.0–47.0)
HX HGB: 7.6 g/dL — ABNORMAL LOW (ref 11.8–15.8)
HX MCH: 37 pg — ABNORMAL HIGH (ref 27.0–31.0)
HX MCHC: 33.3 g/dL — NL (ref 32.0–36.0)
HX MCV: 111 fL — ABNORMAL HIGH (ref 81.0–99.0)
HX MPV: 9.2 fL — NL (ref 7.4–11.5)
HX NRBC PERCENT: 0 % — NL
HX PLATELET: 263 10*3/uL — NL (ref 150.0–400.0)
HX RBC: 2.06 10*6/uL — ABNORMAL LOW (ref 3.6–5.0)
HX RDW: 14.9 % — ABNORMAL HIGH (ref 11.5–14.5)
HX WBC: 6.8 10*3/uL — NL (ref 3.7–11.2)

## 2016-06-15 LAB — HX GLOMERULAR FILTRATION RATE (ESTIMATED)
CASE NUMBER: 2017346001301
HX AFN AMER GLOMERULAR FILTRATION RATE: 25 mL/min/{1.73_m2}
HX NON-AFN AMER GLOMERULAR FILTRATION RATE: 22 mL/min/{1.73_m2}

## 2016-06-15 LAB — HX C-REACTIVE PROTEIN (CRP)
CASE NUMBER: 2017346001301
HX C-REACTIVE PROTEIN: 3.86 mg/dL — ABNORMAL HIGH (ref 0.0–0.8)

## 2016-06-15 LAB — HX ZZANTIBODY SCREEN
CASE NUMBER: 2017346002789
HX ANTIBODY SCREEN: NEGATIVE — NL
HX G1: 0 — NL
HX G2: 0 — NL
HX G3: 0 — NL

## 2016-06-15 LAB — HX MAGNESIUM LEVEL
CASE NUMBER: 2017346001301
HX MAGNESIUM LVL: 2.6 mg/dL — ABNORMAL HIGH (ref 1.8–2.4)

## 2016-06-15 LAB — HX .AUTOMATED DIFF
CASE NUMBER: 2017346001301
HX ABSOLUTE NEUTRO COUNT: 4500 /mm3
HX BASOPHILS: 0 % — NL (ref 0.0–1.0)
HX EOSINOPHILS: 1 % — NL (ref 0.0–3.0)
HX IMMATURE GRANULOCYTES: 1 % — NL (ref 0.0–2.0)
HX LYMPHOCYTES: 19 % — ABNORMAL LOW (ref 22.0–40.0)
HX MONOCYTES: 13 % — ABNORMAL HIGH (ref 0.0–11.0)
HX NEU CT MEASURED: 4.5
HX NEUTROPHILS: 66 % — NL (ref 40.0–71.0)

## 2016-06-15 LAB — HX URINE DIPSTICK W/REFLEX
CASE NUMBER: 2017346001302
HX UA BILIRUBIN: NEGATIVE — NL
HX UA BLOOD: NEGATIVE — NL
HX UA GLUCOSE: NEGATIVE — NL
HX UA KETONES: NEGATIVE — NL
HX UA LEUKOCYTE ESTERASE: NEGATIVE — NL
HX UA NITRITE: NEGATIVE — NL
HX UA PH: 8 — NL (ref 5.0–8.0)
HX UA PROTEIN: NEGATIVE — NL
HX UA RBC: <1 — NL (ref 0.0–3.0)
HX UA SPECIFIC GRAVITY: 1.005 — NL (ref 1.005–1.03)
HX UA SQUAMOUS EPITHELIAL: 2 /HPF — NL (ref 0.0–5.0)
HX UA UROBILINOGEN: 4 — AB
HX UA WBC: <1 — NL (ref 0.0–5.0)

## 2016-06-15 LAB — HX .ABO/RH TYPE TUBE
CASE NUMBER: 2017346002789
HX A1: 0 — NL
HX ABORH: A POS — NL
HX ANTI-B: 0 — NL

## 2016-06-15 LAB — HX IRON PROFILE
CASE NUMBER: 2017346001301
HX % IRON BOUND: 92 % — ABNORMAL HIGH (ref 10.0–50.0)
HX FERRITIN LVL: 673 ng/mL — ABNORMAL HIGH (ref 8.0–252.0)
HX IRON: 129 ug/dL — NL (ref 50.0–170.0)
HX TIBC: 140 ug/dL — ABNORMAL LOW (ref 250.0–450.0)

## 2016-06-15 LAB — HX LACTIC ACID
CASE NUMBER: 2017346001301
CASE NUMBER: 2017346001545
HX LACTIC ACID LVL: 3.4 mmol/L — ABNORMAL HIGH (ref 0.4–2.0)
HX LACTIC ACID LVL: 2.1 mmol/L — ABNORMAL HIGH (ref 0.4–2.0)

## 2016-06-15 LAB — HX PT
CASE NUMBER: 2017346001301
HX INR: 1
HX PT: 11.1 s — NL (ref 9.3–11.6)

## 2016-06-15 NOTE — Progress Notes (Signed)
Progress Note        Patient seen and examined at bedside.    Labs , imaging, and notes reviewed.    Discussed case with RN        vomited after eating food yesterday. does not vomit with liquids.        12 systems examined. Pertinent positives per history of present illness. All  rest are negative            Temperature Oral: 98.8 DegF (06/17/16 08:00:00)    Temperature Oral: 98.8 DegF (06/17/16 04:13:00)    Temperature Oral: 98.2 DegF (06/16/16 23:35:00)    Temperature Oral: 99.2 DegF (06/16/16 19:55:00)    Peripheral Pulse Rate: 84 bpm (06/16/16 08:04:00)    Peripheral Pulse Rate: 76 bpm (06/15/16 19:33:00)    Peripheral Pulse Rate: 63 bpm (06/15/16 11:23:00)    Heart Rate Monitored: 86 bpm (06/17/16 10:00:00)    Heart Rate Monitored: 81 bpm (06/17/16 09:00:00)    Heart Rate Monitored: 82 bpm (06/17/16 08:00:00)    Heart Rate Monitored: 80 bpm (06/17/16 07:00:00)    Respiratory Rate: 20 br/min (06/17/16 10:00:00)    Respiratory Rate: 17 br/min (06/17/16 09:12:00)    Respiratory Rate: 14 br/min (06/17/16 09:10:00)    Respiratory Rate: 23 br/min (06/17/16 08:00:00)    Systolic Blood Pressure: 139 mmHg (06/17/16 09:00:00)    Systolic Blood Pressure: 115 mmHg (06/17/16 04:00:00)    Systolic Blood Pressure: 105 mmHg (06/16/16 23:35:00)    Systolic Blood Pressure: 126 mmHg (06/16/16 20:00:00)    Diastolic Blood Pressure: 88 mmHg (06/17/16 09:00:00)    Diastolic Blood Pressure: 80 mmHg (06/17/16 04:00:00)    Diastolic Blood Pressure: 69 mmHg (06/16/16 23:35:00)    Diastolic Blood Pressure: 59 mmHg (06/16/16 20:00:00)                    Gen: AOx3, in no distress    HEENT: PERLA, EOMI    Resp: normal breath sounds bilaterally    CV: normal S1, S2, no M/R/G    Abd: soft, ND, +BS, no tenderness, no guarding, no rebound tenderness    Extr: no edema    Neuro exam: non focal exam        Diagnostic Data    Labs    Labs (Last four charted values)    WBC 5.0 (DEC 14) 6.6 (DEC 13) 6.8 (DEC 12)    Hgb L 7.3 (DEC 14) L 7.9 (DEC  13) L 7.6 (DEC 12)    Hct L 22.3 (DEC 14) L 24.3 (DEC 13) L 22.8 (DEC 12)    Plt 219 (DEC 14) 244 (DEC 13) 263 (DEC 12)    Na 145 (DEC 14) 144 (DEC 13) 138 (DEC 12)    K 3.7 (DEC 14) 3.6 (DEC 13) 4.3 (DEC 12)    CO2 24 (DEC 14) 27 (DEC 13) 32 (DEC 12)    Cl H 112 (DEC 14) 108 (DEC 13) L 96 (DEC 12)    Cr 1.010 (DEC 14) H 1.620 (DEC 13) H 2.320 (DEC 12)    BUN 12 (DEC 14) H 24 (DEC 13) H 32 (DEC 12)    Glucose Random 97 (DEC 14) 103 (DEC 13) H 123 (DEC 12)    Mg 2.2 (DEC 14) H 2.5 (DEC 13) H 2.6 (DEC 12)    Phos 2.5 (DEC 14) 3.1 (DEC 13)    Ca L 7.6 (DEC 14) L 7.7 (DEC 13) L 7.8 (DEC 12)    PT 11.1 (  DEC 14) 10.7 (DEC 13) 11.1 (DEC 12)    INR 1.0 (DEC 14) 1.0 (DEC 13) 1.0 (DEC 12)    PTT 25.7 (DEC 14) 23.8 (DEC 13)            Creatinine 2.3    Magnesium 2.6    Lactic acid 3.4 down to 2.1    CRP 3.8        Hematology panel no white count    Hemoglobin 7.9        Imaging        CT head negative    Chest x-ray negative    Blood cultures pending        Microbiology        Vital Signs Trend        Afebrile, low-grade temperature noted MAXIMUM TEMPERATURE 99.7    Heartrate 83    121/95% on room air                Home Medications (17) Active    amitriptyline 50 mg, PO, HS    amLODIPine 10 mg, PO, Daily    atenolol 50 mg, PO, BID    Breo Ellipta 100 mcg-25 mcg/inh inhalation powder 1 puff(s), INHAL, Daily    clonazePAM 1 mg oral tablet 1 mg, PO, BID    Cymbalta 60 mg oral delayed release capsule 60 mg = 1 cap(s), PO, Daily    ferrous gluconate 325 mg (36 mg elemental iron) oral tablet 325 mg = 1 tab(s),  PO, Daily    furosemide 40 mg oral tablet See Instructions    furosemide 40 mg oral tablet 40 mg = 1 tab(s), PO, Daily    hydrOXYzine hydrochloride 25 mg oral tablet 25 mg = 1 tab(s), PRN, PO, TID    Latuda 120 mg, PO, Daily    lisinopril 40 mg, PO, Daily    omeprazole 20 mg, PO, BID    oxyCODONE 10 mg oral tablet 10 mg = 1 tab(s), PO, BID    Spiriva 18 mcg inhalation capsule 18 mcg = 1 cap(s), INHAL, Daily    Vitamin B12  250 mcg oral tablet 250 mcg = 1 tab(s), PO, Daily    Vitamin D3 1,000 Unit(s), PO, Daily            Medications (23) Active    Scheduled: (16)    amitriptyline 50 mg tab 50 mg 1 tab(s), PO, HS    budesonide 0.5 mg/2 ml neb 0.5 mg 2 mL, NEB, BID    clonazePAM 1 mg tab 1 mg 1 tab(s), PO, BID    cyanocobalamin 100 mcg tab 250 mcg 2.5 tab(s), PO, Daily    doxycycline 100 mg tab 100 mg 1 tab(s), PO, BIDAC    DULoxetine 30 mg cap 60 mg 2 cap(s), PO, Daily    ferrous sulfate 325mg  ec tab 325 mg 1 tab(s), PO, Daily    formoterol 20 mcg/2 mL neb 20 mcg 2 mL, NEB, BID    lactobacillus rhamnosus cap 2 cap(s), PO, Daily    lurasidone 20 mg Tab 120 mg 6 tab(s), PO, Daily    metoprolol tartrate 50 mg tab 50 mg 1 tab(s), PO, BID    oxyCODONE IR 5 mg tab 10 mg 2 tab(s), PO, BID    pantoprazole 40 mg tablet 40 mg 1 tab(s), PO, BID    sennosides 8.6 mg tab 17.2 mg 2 tab(s), PO, HS    tiotropium 18 mcg cap 18 mcg 1 cap(s), INHAL, Daily  vitamin D 1000 unit(s) 1,000 Unit(s) 1 tab(s), PO, Daily    Continuous: (1)    sodium chloride 0.9% 1,000 mL 1,000 mL, IV, 125 ml/hr    PRN: (6)    acetaminophen 325 mg tab 650 mg 2 tab(s), PO, q4hr    bisacodyl 10 mg suppository 10 mg 1 supp, PR, Daily    docusate 100 mg cap 100 mg 1 cap(s), PO, BID    hydrOXYzine hcl 25 mg tab 25 mg 1 tab(s), PO, TID    ondansetron 2mg /ml vial 2ml 4 mg 2 mL, IV Push, q8hr    traZODone 50 mg tab 25 mg 0.5 tab(s), PO, HS                                Impression :    This is a 65 year old female with history of bypass surgery and bipolar  disorder who presents with nausea vomiting found to be in acute renal failure  in the setting of low blood pressure.                Vomiting    Patient came in with a blood pressure in the systolic of 60s    In the setting of vomiting only with solid foods    This does not apply to liquid foods    Suspicion for gastroparesis or gastric outlet obstruction    Will call for GI consultation    Nuclear medicine gastric emptying study has  been ordered            Acute renal failure without anuria    Likely secondary to prerenal azotemia    Resolved        Electrolites imbalance    Secondary to acute renal failure        Anemia:    will need outpatient f/u    no sign of active bleed    will guaic stool.        Hypotension    and lactic acidosis    concern for sepisis cxr, ua contaminated. bcx pending.    will repeat lactate to resolution, improving overall.    Will check a urine toxicology in light of her medical history    patient on cefepime.    will repeat ua, concern for poss. gram neg. sepsis.            Fall-    - unclear etiology (blind right eye)    - polypharmacy mentioned in h&P    - falls have been more frequent over the last few years.    - pt /ot no sign sof focal weakness on exam.        hepatic steatosis    - incidental finding on kidney ultrasound- f/u outpatient.            pmhx:        1\. Bipolar disorder.    2\. COPD.    3\. Right visual field defect.    4\. History of falls.    5\. 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    6\. Recent admission with acute systolic congestive heart failure.    7\. Acute renal failure.    8\. Toxic metabolic encephalopathy.    9\. Acute-on-chronic anemia status post red blood cell transfusion.  Anemia    4\. Likely chronic disease. Will check stool for OB and iron profile. Will  keep on BIB PPI. no active GIB suspected. She has elevated MCV.                Cardiac Solid Diet (CCU Solid Diet) - Ordered    -- 06/15/16 21:00:00 EST, Room Service, Scheduled / PRN            Code Status - Ordered    -- 06/15/16 20:02:00 EST, Do Not Resuscitate, NO Intubation/Ventilation,  Constant Order        unable to discuss advance directives at this time.    SIGNATURE LINE Electronically signed by Hoy Register MD, Teyonna Plaisted on 06/17/2016 at  13:20:20 EST

## 2016-06-15 NOTE — Progress Notes (Signed)
Progress Note        Patient seen and examined at bedside.    Labs , imaging, and notes reviewed.    Discussed case with RN        doing well. eating her lunch at bedside.        12 systems examined. Pertinent positives per history of present illness. All  rest are negative            Temperature Oral: 98.5 DegF (06/16/16 08:00:00)    Temperature Oral: 99 DegF (06/16/16 04:35:00)    Temperature Oral: 98.8 DegF (06/16/16 00:00:00)    Temperature Oral: 99.7 DegF High (06/15/16 20:17:00)    Peripheral Pulse Rate: 76 bpm (06/15/16 19:33:00)    Peripheral Pulse Rate: 63 bpm (06/15/16 11:23:00)    Heart Rate Monitored: 79 bpm (06/16/16 06:00:00)    Heart Rate Monitored: 83 bpm (06/16/16 04:00:00)    Heart Rate Monitored: 78 bpm (06/16/16 02:00:00)    Heart Rate Monitored: 78 bpm (06/16/16 00:00:00)    Respiratory Rate: 22 br/min (06/16/16 04:00:00)    Respiratory Rate: 20 br/min (06/16/16 00:00:00)    Respiratory Rate: 16 br/min (06/15/16 20:40:00)    Respiratory Rate: 20 br/min (06/15/16 18:00:00)    Systolic Blood Pressure: 121 mmHg (06/16/16 04:00:00)    Systolic Blood Pressure: 115 mmHg (06/16/16 00:00:00)    Systolic Blood Pressure: 131 mmHg (06/15/16 19:33:00)    Systolic Blood Pressure: 136 mmHg (06/15/16 18:00:00)    Diastolic Blood Pressure: 71 mmHg (06/16/16 04:00:00)    Diastolic Blood Pressure: 80 mmHg (06/16/16 00:00:00)    Diastolic Blood Pressure: 95 mmHg High (06/15/16 19:33:00)    Diastolic Blood Pressure: 103 mmHg High (06/15/16 18:00:00)            Gen: AOx3, in no distress    HEENT: PERLA, EOMI    Resp: normal breath sounds bilaterally    CV: normal S1, S2, no M/R/G    Abd: soft, ND, +BS, no tenderness, no guarding, no rebound tenderness    Extr: no edema    Neuro exam: non focal exam        Diagnostic Data    Labs    Labs (Last four charted values)    WBC 6.6 (DEC 13) 6.8 (DEC 12)    Hgb L 7.9 (DEC 13) L 7.6 (DEC 12)    Hct L 24.3 (DEC 13) L 22.8 (DEC 12)    Plt 263 (DEC 12)    Na 138 (DEC 12)     K 4.3 (DEC 12)    CO2 32 (DEC 12)    Cl L 96 (DEC 12)    Cr H 2.320 (DEC 12)    BUN H 32 (DEC 12)    Glucose Random H 123 (DEC 12)    Mg H 2.6 (DEC 12)    Ca L 7.8 (DEC 12)    PT 10.7 (DEC 13) 11.1 (DEC 12)    INR 1.0 (DEC 13) 1.0 (DEC 12)    PTT 23.8 (DEC 13)            Creatinine 2.3    Magnesium 2.6    Lactic acid 3.4 down to 2.1    CRP 3.8        Hematology panel no white count    Hemoglobin 7.9        Imaging        CT head negative    Chest x-ray negative    Blood cultures pending  Microbiology        Vital Signs Trend        Afebrile, low-grade temperature noted MAXIMUM TEMPERATURE 99.7    Heartrate 83    121/95% on room air                Home Medications (17) Active    amitriptyline 50 mg, PO, HS    amLODIPine 10 mg, PO, Daily    atenolol 50 mg, PO, BID    Breo Ellipta 100 mcg-25 mcg/inh inhalation powder 1 puff(s), INHAL, Daily    clonazePAM 1 mg oral tablet 1 mg, PO, BID    Cymbalta 60 mg oral delayed release capsule 60 mg = 1 cap(s), PO, Daily    ferrous gluconate 325 mg (36 mg elemental iron) oral tablet 325 mg = 1 tab(s),  PO, Daily    furosemide 40 mg oral tablet See Instructions    furosemide 40 mg oral tablet 40 mg = 1 tab(s), PO, Daily    hydrOXYzine hydrochloride 25 mg oral tablet 25 mg = 1 tab(s), PRN, PO, TID    Latuda 120 mg, PO, Daily    lisinopril 40 mg, PO, Daily    omeprazole 20 mg, PO, BID    oxyCODONE 10 mg oral tablet 10 mg = 1 tab(s), PO, BID    Spiriva 18 mcg inhalation capsule 18 mcg = 1 cap(s), INHAL, Daily    Vitamin B12 250 mcg oral tablet 250 mcg = 1 tab(s), PO, Daily    Vitamin D3 1,000 Unit(s), PO, Daily            Medications (24) Active    Scheduled: (17)    amitriptyline 50 mg tab 50 mg 1 tab(s), PO, HS    budesonide 0.5 mg/2 ml neb 0.5 mg 2 mL, NEB, BID    cefepime in D5W duplex 1 gm/50 ml 1 Gm 50 mL, IV Piggyback, q12hr    clonazePAM 1 mg tab 1 mg 1 tab(s), PO, BID    cyanocobalamin 100 mcg tab 250 mcg 2.5 tab(s), PO, Daily    doxycycline 100 mg tab 100 mg 1 tab(s),  PO, BIDAC    DULoxetine 30 mg cap 60 mg 2 cap(s), PO, Daily    ferrous sulfate 325mg  ec tab 325 mg 1 tab(s), PO, Daily    formoterol 20 mcg/2 mL neb 20 mcg 2 mL, NEB, BID    lactobacillus rhamnosus cap 2 cap(s), PO, Daily    lurasidone 20 mg Tab 120 mg 6 tab(s), PO, Daily    metoprolol tartrate 50 mg tab 50 mg 1 tab(s), PO, BID    oxyCODONE IR 5 mg tab 10 mg 2 tab(s), PO, BID    pantoprazole 40 mg tablet 40 mg 1 tab(s), PO, BID    sennosides 8.6 mg tab 17.2 mg 2 tab(s), PO, HS    tiotropium 18 mcg cap 18 mcg 1 cap(s), INHAL, Daily    vitamin D 1000 unit(s) 1,000 Unit(s) 1 tab(s), PO, Daily    Continuous: (1)    sodium chloride 0.9% 1,000 mL 1,000 mL, IV, 125 ml/hr    PRN: (6)    acetaminophen 325 mg tab 650 mg 2 tab(s), PO, q4hr    bisacodyl 10 mg suppository 10 mg 1 supp, PR, Daily    docusate 100 mg cap 100 mg 1 cap(s), PO, BID    hydrOXYzine hcl 25 mg tab 25 mg 1 tab(s), PO, TID    ondansetron 2mg /ml vial 2ml 4 mg 2 mL, IV  Push, q8hr    traZODone 50 mg tab 25 mg 0.5 tab(s), PO, HS             This visit (24 hour periods starting at 07:00)    ------     06/16/16 *  06/15/16  06/14/16    ------------    Total Summary     ------    Intake mL  \--  4,020  \--    ------------    Output mL  \--  500  \--    ------------    Fluid Balance  \--  3,520  \--    ------------    Intake (6)     ------    Dextrose 5% in Water, piperacillin-tazobactam mL  \--  100  \--    ------------    Oral Intake mL  \--  120  \--    ------------    Pre-Arrival Fluid mL  \--  250  \--    ------------    Sodium Chloride 0.9% 1,000 mL mL  \--  3,000  \--    ------------    Sodium Chloride 0.9%, vancomycin mL  \--  500  \--    ------------    cefepime mL  \--  50  \--    ------------    Total  \--  4,020  \--    ------------    Output (1)     ------    Urine Voided mL  \--  500  \--    ------------    Total  \--  500  \--    ------------    Counts (4)      ------    Oral Intake mL  \--  120  \--    ------------    Pre-Arrival Fluid mL  \--  250  \--    ------------    Urine Count  \--  1  \--    ------------    Urine Voided mL  \--  500  \--    ------------        * This column has not completed the indicated time period.                 Impression :                Acute renal failure without anuria    Strict I's and O's    Patient is without anuria    Will check urine sodium and electrolytes    Will check hepatitis C    LDH    RPR    UA    urine protein and creatinine ratio    In addition will rule out obstruction with renal ultrasound    Hold lisinopril    Triage at this point unclear will attempt to rule out renal and postrenal and  prerenal causes    Than that the patient had relatively low blood pressures, prerenal etiology is  most likely        Please discontinue all medications that may be renal cleared        Hold nephrology consult if creatinine improving        Electrolites imbalance    Secondary to acute renal failure            Hypotension    and lactic acidosis    concern for sepisis cxr, ua contaminated. bcx pending.    will repeat lactate to resolution, improving overall.    Will check a  urine toxicology in light of her medical history    patient on cefepime.    will repeat ua, concern for poss. gram neg. sepsis.            Fall-    - unclear etiology,    - polypharmacy mentioned in h&P    - falls have been more frequent over the last few years.    - pt /ot no sign sof focal weakness on exam.        hepatic steatosis    - incidental finding on kidney ultrasound- f/u outpatient.            pmhx:        1\. Bipolar disorder.    2\. COPD.    3\. Right visual field defect.    4\. History of falls.    5\. 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    6\. Recent admission with acute systolic congestive heart failure.    7\. Acute renal failure.    8\. Toxic metabolic encephalopathy.    9\. Acute-on-chronic anemia  status post red blood cell transfusion.                                Anemia    4\. Likely chronic disease. Will check stool for OB and iron profile. Will  keep on BIB PPI. no active GIB suspected. She has elevated MCV.                Cardiac Solid Diet (CCU Solid Diet) - Ordered    -- 06/15/16 21:00:00 EST, Room Service, Scheduled / PRN            Code Status - Ordered    -- 06/15/16 20:02:00 EST, Do Not Resuscitate, NO Intubation/Ventilation,  Constant Order            declines discussion of advance directives at this time.      SIGNATURE LINE Electronically signed by Hoy Register MD, Mayer Vondrak on 06/16/2016 at  13:16:41 EST

## 2016-06-15 NOTE — Discharge Summary (Signed)
Date of Admission    06/15/2016    Date of Discharge    06/18/2016    Admission History    please see H&P for details:    65 year old with history of falls who reports falling at home yesterday. She  thinks her falls have been due to medications. She was able to call 911 using  her alert system.    She claims she does not walk with a walker or cane. Her falls have been going  on for the past 2 years. She did hit her head but without any noted fracture.  She had a negative CT head.    She does not have a fever and denies chills. She has a chronic cough and this  has not changed in character. [1]    Code Status    Code Status - Ordered    -- 06/15/16 20:02:00 EST, Do Not Resuscitate, NO Intubation/Ventilation,  Constant Order          Allergies    NKA    Social History    _Alcohol_    Current, Liquor, 1-2 times per week    _Tobacco_    Current every day smoker, Cigarettes    Hospital Course    65 year old female with history of bypass surgery and bipolar disorder who  presents with nausea vomiting found to be in acute renal failure in the  setting of low blood pressure and lactic acidosis. no acute infection found.  likely related with dehydration while on BP meds. BP improving, renal function  back to normal. lactic acidosis resolved. GI consulted. NM gastric emptying  study: Normal gastric emptying. pt felt abd pain improving, able to tolerated  PO. wanted to go home. suggested small meals and zofran prn for symptoms.      Procedures and Treatment Provided    CXR:    No focal airspace disease.    [2]        CT head:    No evidence of acute intracranial injury.    [3]        renal u/s:    No evidence of hydronephrosis. Elevated PVR.        Incidentally noted echogenic hepatic parenchyma suggesting steatosis.    [4]        NM gastric emptying study:    Normal gastric emptying.    [5]    Physical Exam    Vitals & Measurements    **T:** 98.8 F (Oral) **TMIN:** 98.6 F (Oral) **TMAX:** 99.5 F (Oral)  **HR:** 99  **RR:** 18 **BP:** 126/70 **SpO2:** 95% **O2 Method (L/min):** Room  air        Respiratory: Lungs are clear to auscultation, Breath sounds are equal.    Cardiovascular: Regular rate and rhythm.    Gastrointestinal: Soft, Non-tender, Non-distended    Neurologic: awake    Psychiatric: Cooperative      Lab Results    Glucose Lvl: 102 mg/dL (16/10/96 04:54:09)    BUN: 7 mg/dL (81/19/14 78:29:56)    Creatinine: 0.809 mg/dL (21/30/86 57:84:69)    Afn Amer Glomerular Filtration Rate: 89 ml/min/1.58m2 (06/18/16 10:04:12)    Non-Afn Amer Glomerular Filtration Rate: 77 ml/min/1.28m2 (06/18/16 10:04:14)    Sodium Lvl: 142 mmol/L (06/18/16 10:04:09)    Potassium Lvl: 3.5 mmol/L Low (06/18/16 10:04:09)    Chloride: 109 mmol/L (06/18/16 10:04:09)    CO2: 24 mmol/L (06/18/16 10:04:09)    Anion Gap: 9 (06/18/16 10:04:09)    Total Protein: 6.4 Gm/dL (62/95/28 41:32:44)  Albumin Lvl: 2.4 Gm/dL Low (16/10/96 04:54:09)    Calcium Lvl: 8 mg/dL Low (81/19/14 78:29:56)    Phosphorus: 1.8 mg/dL Low (21/30/86 57:84:69)    Magnesium Lvl: 2 mg/dL (62/95/28 41:32:44)    Bilirubin Total: 0.7 mg/dL (07/06/70 53:66:44)    Bilirubin Direct: 0.4 mg/dL High (03/47/42 59:56:38)    Alkaline Phosphatase: 198 Units/L High (06/18/16 10:04:11)    AST: 27 Units/L (06/18/16 10:04:11)    ALT: 31 Units/L (06/18/16 10:04:11)    NT-ProBNP: 20826 pGm/ml High (06/18/16 10:04:16)    WBC: 6.6 thous/mm3 (06/18/16 09:17:45)    RBC: 2.1 Mil/mm3 Low (06/18/16 09:17:45)    Hgb: 7.6 Gm/dL Low (75/64/33 29:51:88)    Hct: 23.6 % Low (06/18/16 09:17:45)    Platelet: 258 thous/mm3 (06/18/16 09:17:45)    MCV: 112 fL High (06/18/16 09:17:45)    MCH: 36 pGm High (06/18/16 09:17:45)    MCHC: 32.2 Gm/dL (41/66/06 30:16:01)    RDW: 15.1 % High (06/18/16 09:17:45)    MPV: 9 fL (06/18/16 09:17:45)    NRBC Percent: 0 % (06/18/16 09:17:45)    PT: 10.9 sec (06/18/16 09:25:09)    INR: 1 (06/18/16 09:25:09)    PTT: 25.5 sec (06/18/16 09:25:10)          Discharge  Diagnoses    Acidosis    Acute on chronic renal failure    Anemia    Hypotension    Ordered:    nausea and vomit    Discharge Medications    _Discharge_    amitriptyline, 50 mg, PO, HS    amLODIPine, 10 mg, PO, Daily    atenolol, 50 mg, PO, BID    Breo Ellipta 100 mcg-25 mcg/inh inhalation powder, 1 puff(s), INHAL, Daily    clonazePAM 1 mg oral tablet, 1 mg, PO, BID    Cymbalta 60 mg oral delayed release capsule, 60 mg= 1 cap(s), PO, Daily    ferrous gluconate 325 mg (36 mg elemental iron) oral tablet, 325 mg= 1 tab(s),  PO, Daily    furosemide 40 mg oral tablet, See Instructions    furosemide 40 mg oral tablet, 40 mg= 1 tab(s), PO, Daily    hydrOXYzine hydrochloride 25 mg oral tablet, 25 mg= 1 tab(s), PO, TID, PRN    Latuda, 120 mg, PO, Daily    lisinopril, 40 mg, PO, Daily    omeprazole, 20 mg, PO, BID    oxyCODONE 10 mg oral tablet, 10 mg= 1 tab(s), PO, BID    Spiriva 18 mcg inhalation capsule, 18 mcg= 1 cap(s), INHAL, Daily    Vitamin B12 250 mcg oral tablet, 250 mcg= 1 tab(s), PO, Daily    Vitamin D3, 1000 Unit(s), PO, Daily    Zofran 4 mg oral tablet, 4 mg= 1 tab(s), PO, q8hr, PRN    Discharge Instructions    DISCHARGE CONDITION: hemodynamically stable        DISPOSITION: Home        FOLLOW UP:    1\. f/u with PCP in 1-2 weeks    2\. f/u with GI as needed        DISCHARGE INSTRUCTION:        diet as tolerated, keep small meals. keep oral hydration        updated patient about hospital course and medication adjustment. showed  understanding        Total face to face contact during this discharge is more than 30 minutes        Please cc the discharge summary to  PCP        Face to Face Encounter: Based on my findings, the following services are  medically necessary home health services.    I had a face-to-face encounter with this patient during which a medical  condition was addressed which is the primary reason for home health care on:  06/18/2016    Based on my findings, the following services are medically  necessary home  health services: Nursing, Physical Therapy, Occupational Therapy, Speech  Therapy.        Skilled nursing service which includes assessment, teaching and intervention:  Wound Care, IV Therapy, Medication Management and Education, Disease  Management, Symptom Control, Ostomy Care and Teaching, Post-Surgical Care,  Catheter Care, Enteral Therapy Teaching, Injectable Medication.        Skilled Soil scientist which includes assessment, teaching and  intervention -PT/OT/ST: Therapeutic Exercise, Gait Training, ROM, Home  Exercise Program, Home Safety Evaluation, Assessment of DME and Adaptive  Equipment, Modalities [e.g., E Stim, Ultrasound], Pain and Symptom Management,  Orthopedic Rehabilitation Following Surgery, Speech and Swallowing Disorders,  Dysphagia Treatment, Fall Risk, Restore Patient Function.        Homebound (i.e. absences from home require considerable and taxing effort and  are for medical reason or religious services or infrequently or of short  duration when for other reasons): Unsafe Ambulation, Gait Disorder, Dyspnea  with Minimal Exertion, Low Vision, Uncontrolled Pain Interfering with  Activity, Frequent Falls, Unable to [employer], Use of Supportive Device  [Cane, Walker], Complex Wound, Psychiatric Symptoms, Cognitive Deficits,  Memory Impairments.        Based on the above findings, I certify that this patient is confined to the  home and needs intermittent skilled nursing care, physical therapy and /or  speech therapy or continues to need occupational therapy: The patient is under  my care, and I have initiated the establishment of the plan of care. This  patient will be followed by a physician who will periodically review the plan  of care.      [1] Admission H & P; Ngomba MD-HOSP, Evaristo Bury 06/15/2016 19:45 EST    [2] XR Chest 1 View Frontal; Routhier MD, Jill Alexanders 06/15/2016 11:32 EST    [3] CT Head or Brain C-; Hildred Laser MD, Joni Reining 06/15/2016 14:00 EST    [4] [place]  Retroperitoneal Complete; Kathaleen Bury MD, Kristopher 06/16/2016 10:42 EST    [5] NM Gastric Emptying Study; Margo Aye MD, Bruce 06/17/2016 14:25 EST    SIGNATURE LINE Electronically signed by Loel Dubonnet MD, Charlene Brooke on 06/18/2016 at  12:34:23 EST

## 2016-06-15 NOTE — H&P (Signed)
Chief Complaint    Fall, weakness    History of Present Illness    65 year old with history of falls who reports falling at home yesterday. She  thinks her falls have been due to medications. She was able to call 911 using  her alert system.    She claims she does not walk with a walker or cane. Her falls have been going  on for the past 2 years. She did hit her head but without any noted fracture.  She had a negative CT head.    She does not have a fever and denies chills. She has a chronic cough and this  has not changed in character.    Review of Systems    12 point review of system negative, except as in history of present illness  and past medical history.    Code Status    DNR/DNI    Physical Exam    Vitals & Measurements    **T:** 98.8 F (Oral) **TMIN:** 98.1 F (Oral) **TMAX:** 98.8 F (Oral)  **HR:** 80 **RR:** 20 **BP:** 131/95 **SpO2:** 97% **O2 Method (L/min):** Room  air **WT:** 90.71 Kg    General: Alert and oriented, no acute distress    Eye: PERRL, Normal Conjunctiva    HEENT: Normocephalic, Atraumatic, PERRLA, CN 2 to 12 Normal, ear canals  patent, no sinus tenderness    Neck: supple, nontender, carotid pulse WNL, no carotid bruit, no JVD, no  lymphadenopathy, no thyromegaly, full ROM    Respiratory: non-labored, BS equal and regular, symmetrical chest expansion,  no chest wall tenderness    Cardiovascular: Normal Rate, normal rhythm,  beats/minute, no gallop, good  pulses equal in all extremities, normal peripheral perfusion    Gastrointestinal: Abdomen soft, non tender, non-distended, normal bowel sounds  all four quadrants, no organomegaly    Genitourinary: No CVA tenderness    Musculoskeletal: normal ROM, normal strength, no tenderness, no swelling, no  deformity, normal gait    Integumentary: Skin intact, warm, pink, dry. No pallor, no rash.    Neurologic: Alert, Oriented,al balance    Psychiatric: Cooperative, appropriate mood & affect, normal judgment, non-  suicidal    Impression and  Plan    Acute on chronic renal failure    1\. She has hypotension and hypovolemia and took antihypertensive meds  including lisinopril which will be held.    fall    2\. may be medication SE. She has polypharmacy.    Hypotension    3\. resolved with fluid. We cannot rule out infection. BCx were sent and  emperic Antibiotic given. Her lactic acidosis is a concern, but this may be  due to hypovolemia. Will Give NS overnight. Hold Lasix and all BP meds except  B-Blocker which will be given with parameters. I doubt sepsis. No fever or  leukocytosis. will send a UA    Anemia    4\. Likely chronic disease. Will check stool for OB and iron profile. Will  keep on BIB PPI. no active GIB suspected. She has elevated MCV.    CMS Two-Midnight Rule    I certify that hospital inpatient services are reasonable and medically  necessary. They are appropriately provided as inpatient services in accordance  with the two midnight benchmark under 42CFR 412 3(e), or the services are  specified as inpatient only procedure under 42 CFR 419 22(n).          Problem List/Past Medical History    Falls    Anxiety disorder.  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    Social History    _Alcohol_    Current, Liquor, 1-2 times per week    _Tobacco_    Current every day smoker, Cigarettes    Family History    Family history is negative    Allergies    NKA    Medications    _Inpatient_    bolus NS 1,000 mL, 1000 mL, IV    bolus NS 1,000 mL, 1000 mL, IV    _Home_    amitriptyline, 50 mg, PO, HS    amLODIPine, 10 mg, PO, Daily    atenolol, 50 mg, PO, BID    Breo Ellipta 100 mcg-25 mcg/inh inhalation powder, 1 puff(s), INHAL, Daily    clonazePAM 1 mg oral tablet, 1 mg, PO, BID    Cymbalta 60 mg oral delayed release capsule, 60 mg= 1 cap(s), PO, Daily    ferrous gluconate 325 mg (36 mg elemental iron) oral tablet, 325 mg= 1 tab(s),  PO, Daily    furosemide 40 mg oral tablet, See Instructions    furosemide 40 mg  oral tablet, 40 mg= 1 tab(s), PO, Daily    hydrOXYzine hydrochloride 25 mg oral tablet, 25 mg= 1 tab(s), PO, TID, PRN    Latuda, 120 mg, PO, Daily    lisinopril, 40 mg, PO, Daily    omeprazole, 20 mg, PO, BID    oxyCODONE 10 mg oral tablet, 10 mg= 1 tab(s), PO, BID    Spiriva 18 mcg inhalation capsule, 18 mcg= 1 cap(s), INHAL, Daily    Vitamin B12 250 mcg oral tablet, 250 mcg= 1 tab(s), PO, Daily    Vitamin D3, 1000 Unit(s), PO, Daily    Diet    No qualifying data available.      Lab Results    Glucose Lvl: 123 mg/dL High (16/10/96 04:54:09)    BUN: 32 mg/dL High (81/19/14 78:29:56)    Creatinine: 2.32 mg/dL High (21/30/86 57:84:69)    Afn Amer Glomerular Filtration Rate: 25 ml/min/1.15m2 (06/15/16 12:20:22)    Non-Afn Amer Glomerular Filtration Rate: 22 ml/min/1.28m2 (06/15/16 12:20:24)    Sodium Lvl: 138 mmol/L (06/15/16 12:20:20)    Potassium Lvl: 4.3 mmol/L (06/15/16 12:20:20)    Chloride: 96 mmol/L Low (06/15/16 12:20:20)    CO2: 32 mmol/L (06/15/16 12:20:20)    Anion Gap: 10 (06/15/16 12:20:20)    Total Protein: 5.5 Gm/dL Low (62/95/28 41:32:44)    Albumin Lvl: 2.2 Gm/dL Low (07/06/70 53:66:44)    Calcium Lvl: 7.8 mg/dL Low (03/47/42 59:56:38)    Magnesium Lvl: 2.6 mg/dL High (75/64/33 29:51:88)    Bilirubin Total: 1.1 mg/dL (41/66/06 30:16:01)    Alkaline Phosphatase: 234 Units/L High (06/15/16 12:20:20)    AST: 34 Units/L (06/15/16 12:20:20)    ALT: 41 Units/L (06/15/16 12:20:20)    Lactic Acid Lvl: 2.1 mmol/L High (06/15/16 17:33:14)    C-Reactive Protein: 3.86 mg/dL High (09/32/35 57:32:20)    WBC: 6.8 thous/mm3 (06/15/16 11:49:21)    RBC: 2.06 Mil/mm3 Low (06/15/16 11:49:21)    Hgb: 7.6 Gm/dL Low (25/42/70 62:37:62)    Hct: 22.8 % Low (06/15/16 11:49:21)    Platelet: 263 thous/mm3 (06/15/16 11:49:21)    MCV: 111 fL High (06/15/16 11:49:21)    MCH: 37 pGm High (06/15/16 11:49:21)    MCHC: 33.3 Gm/dL (83/15/17 61:60:73)    RDW: 14.9 % High (06/15/16 11:49:21)    MPV: 9.2 fL (06/15/16 11:49:21)     Neutrophils: 66 % (  06/15/16 11:49:22)    Lymphocytes: 19 % Low (06/15/16 11:49:22)    Monocytes: 13 % High (06/15/16 11:49:22)    Eosinophils: 1 % (06/15/16 11:49:22)    Basophils: 0 % (06/15/16 11:49:22)    Immature Granulocytes: 1 % (06/15/16 11:49:22)    NRBC Percent: 0 % (06/15/16 11:49:21)    Absolute Neutro Count: 4500 /mm3 (06/15/16 11:49:22)    PT: 11.1 sec (06/15/16 11:52:51)    INR: 1 (06/15/16 11:52:51)    UA Color: Yellow1 (06/15/16 14:29:26)    UA Clarity: Clear1 (06/15/16 14:29:26)    UA Specific Gravity: 1.005 (06/15/16 14:29:26)    UA pH: 8 (06/15/16 14:29:26)    UA Protein: Negative1 (06/15/16 14:29:26)    UA Glucose: Negative1 (06/15/16 14:29:26)    UA Ketones: Negative1 (06/15/16 14:29:26)    UA Bilirubin: Negative1 (06/15/16 14:29:26)    UA Blood: Negative1 (06/15/16 14:29:26)    UA Urobilinogen: 4.0 Abnormal (06/15/16 14:29:26)    UA Nitrite: Negative1 (06/15/16 14:29:26)    UA Leukocyte Esterase: Negative1 (06/15/16 14:29:26)    UA RBC: <1 (06/15/16 14:29:26)    UA WBC: <1 (06/15/16 14:29:26)    UA Squamous Epithelial: 2 /HPF (06/15/16 14:29:26)    UA Bacteria: 3+ Abnormal (06/15/16 14:29:26)          Diagnostic Results    CXR:No infiltrate seen.        ------        SIGNATURE LINE Electronically signed by Sandrea Matte MD-HOSP, Evaristo Bury on 06/15/2016  at 20:46:31 EST

## 2016-06-16 LAB — HX URINE DIPSTICK W/REFLEX
CASE NUMBER: 2017347000604
HX UA BILIRUBIN: NEGATIVE — NL
HX UA BLOOD: NEGATIVE — NL
HX UA KETONES: NEGATIVE — NL
HX UA LEUKOCYTE ESTERASE: NEGATIVE — NL
HX UA NITRITE: NEGATIVE — NL
HX UA PH: 6 — NL (ref 5.0–8.0)
HX UA PROTEIN: NEGATIVE — NL
HX UA RBC: <1 — NL (ref 0.0–3.0)
HX UA SPECIFIC GRAVITY: 1.014 — NL (ref 1.005–1.03)
HX UA SQUAMOUS EPITHELIAL: 2 /HPF — NL (ref 0.0–5.0)
HX UA UROBILINOGEN: 4 — AB
HX UA WBC: <1 — NL (ref 0.0–5.0)

## 2016-06-16 LAB — HX DRUGS OF ABUSE URINE 9
CASE NUMBER: 2017347000593
HX U AMPHETAMINES SCRN: NEGATIVE — NL
HX U BARBITURATE SCRN: NEGATIVE — NL
HX U BENZODIAZEPINE SCRN: NEGATIVE — NL
HX U CANNABINOID SCRN: NEGATIVE — NL
HX U COCAINE SCRN: NEGATIVE — NL
HX U ETHANOL INTERP: NEGATIVE — NL
HX U ETHANOL: <3 — NL (ref 0.0–49.0)
HX U METHADONE SCRN: NEGATIVE — NL
HX U OPIATE SCRN: NEGATIVE — NL
HX U PH FOR DAU: 6 — NL (ref 4.5–8.0)
HX U PHENCYCLIDINE SCRN: NEGATIVE — NL

## 2016-06-16 LAB — HX HEPATITIS B SURFACE ANTIGEN
CASE NUMBER: 2017347000616
HX HBSAG MEASURED: <0.1 — NL (ref 0.0–0.99)
HX HEP B SURFACE AG: NEGATIVE
HX HEP BS AG INST: NONREACTIVE

## 2016-06-16 LAB — HX C-REACTIVE PROTEIN (CRP)
CASE NUMBER: 15949128
HX C-REACTIVE PROTEIN: 3.87 mg/dL — ABNORMAL HIGH (ref 0.0–0.8)

## 2016-06-16 LAB — HX BASIC METABOLIC PANEL
CASE NUMBER: 2017347000201
HX ANION GAP: 9 — NL (ref 3.0–11.0)
HX BUN: 24 mg/dL — ABNORMAL HIGH (ref 7.0–18.0)
HX CALCIUM LVL: 7.7 mg/dL — ABNORMAL LOW (ref 8.5–10.1)
HX CHLORIDE: 108 mmol/L — NL (ref 98.0–110.0)
HX CO2: 27 mmol/L — NL (ref 21.0–32.0)
HX CREATININE: 1.62 mg/dL — ABNORMAL HIGH (ref 0.55–1.3)
HX GLUCOSE LVL: 103 mg/dL — NL (ref 65.0–110.0)
HX POTASSIUM LVL: 3.6 mmol/L — NL (ref 3.6–5.2)
HX SODIUM LVL: 144 mmol/L — NL (ref 136.0–145.0)

## 2016-06-16 LAB — HX .AUTOMATED DIFF
CASE NUMBER: 2017347000201
HX ABSOLUTE NEUTRO COUNT: 4660 /mm3
HX BASOPHILS: 0 % — NL (ref 0.0–1.0)
HX EOSINOPHILS: 1 % — NL (ref 0.0–3.0)
HX IMMATURE GRANULOCYTES: 0 % — NL (ref 0.0–2.0)
HX LYMPHOCYTES: 17 % — ABNORMAL LOW (ref 22.0–40.0)
HX MONOCYTES: 11 % — NL (ref 0.0–11.0)
HX NEU CT MEASURED: 4.66
HX NEUTROPHILS: 70 % — NL (ref 40.0–71.0)

## 2016-06-16 LAB — HX LACTIC ACID
CASE NUMBER: 2017347000594
HX LACTIC ACID LVL: 1.5 mmol/L — NL (ref 0.4–2.0)

## 2016-06-16 LAB — HX GLOMERULAR FILTRATION RATE (ESTIMATED)
CASE NUMBER: 2017347000201
HX AFN AMER GLOMERULAR FILTRATION RATE: 38 mL/min/{1.73_m2}
HX NON-AFN AMER GLOMERULAR FILTRATION RATE: 33 mL/min/{1.73_m2}

## 2016-06-16 LAB — HX MAGNESIUM LEVEL
CASE NUMBER: 2017347000201
HX MAGNESIUM LVL: 2.5 mg/dL — ABNORMAL HIGH (ref 1.8–2.4)

## 2016-06-16 LAB — HX SODIUM LEVEL URINE
CASE NUMBER: 2017347000574
HX U SODIUM: 41 mmol/L

## 2016-06-16 LAB — HX CBC W/ DIFF
CASE NUMBER: 2017347000201
HX HCT: 24.3 % — ABNORMAL LOW (ref 36.0–47.0)
HX HGB: 7.9 g/dL — ABNORMAL LOW (ref 11.8–15.8)
HX MCH: 37 pg — ABNORMAL HIGH (ref 27.0–31.0)
HX MCHC: 32.5 g/dL — NL (ref 32.0–36.0)
HX MCV: 113 fL — ABNORMAL HIGH (ref 81.0–99.0)
HX MPV: UNDETERMINED — NL (ref 7.4–11.5)
HX NRBC PERCENT: 0 % — NL
HX PLATELET: 244 10*3/uL — NL (ref 150.0–400.0)
HX RBC: 2.15 10*6/uL — ABNORMAL LOW (ref 3.6–5.0)
HX RDW: 15 % — ABNORMAL HIGH (ref 11.5–14.5)
HX WBC: 6.6 10*3/uL — NL (ref 3.7–11.2)

## 2016-06-16 LAB — HX PTT
CASE NUMBER: 2017347000201
HX PTT: 23.8 s — NL (ref 23.4–32.1)

## 2016-06-16 LAB — HX CREATININE URINE
CASE NUMBER: 2017347000574
HX U CREAT: 90 mg/dL

## 2016-06-16 LAB — HX HEPATIC FUNCTION PANEL
CASE NUMBER: 2017347000201
HX ALBUMIN LVL: 2.4 g/dL — ABNORMAL LOW (ref 3.5–5.0)
HX ALKALINE PHOSPHATASE: 232 U/L — ABNORMAL HIGH (ref 45.0–117.0)
HX ALT: 37 U/L — NL (ref 6.0–78.0)
HX AST: 32 U/L — NL (ref 6.0–40.0)
HX BILIRUBIN DIRECT: 0.5 mg/dL — ABNORMAL HIGH (ref 0.0–0.3)
HX BILIRUBIN TOTAL: 0.9 mg/dL — NL (ref 0.2–1.2)
HX TOTAL PROTEIN: 6.2 g/dL — NL (ref 6.0–8.0)

## 2016-06-16 LAB — HX NT-PROBNP
CASE NUMBER: 2017347000201
HX NT-PROBNP: 7432 pg/mL — ABNORMAL HIGH (ref 0.0–900.0)

## 2016-06-16 LAB — HX HEPATITIS C ANTIBODY
CASE NUMBER: 2017347000614
HX HEP C AB INST: NONREACTIVE
HX HEP C AB MEASURED: 0.1 — NL (ref 0.0–0.8)
HX HEP C AB: NONREACTIVE

## 2016-06-16 LAB — HX LACTATE DEHYDROGENASE
CASE NUMBER: 15949879
HX LDH: 241 U/L — NL (ref 84.0–246.0)

## 2016-06-16 LAB — HX HEPATITIS B SURFACE ANTIBODY
CASE NUMBER: 2017347000614
HX HEP B SURFACE AB CONCENTRATION: <3
HX HEP B SURFACE AB: NONREACTIVE

## 2016-06-16 LAB — HX CHLORIDE LEVEL URINE
CASE NUMBER: 2017347000574
HX U CHLORIDE: 23 mmol/L

## 2016-06-16 LAB — HX PT
CASE NUMBER: 2017347000201
HX INR: 1
HX PT: 10.7 s — NL (ref 9.3–11.6)

## 2016-06-16 LAB — HX HEPATITIS B CORE ANTIBODY
CASE NUMBER: 2017347000614
HX HEP B CORE AB: NONREACTIVE

## 2016-06-16 LAB — HX PHOSPHORUS LEVEL
CASE NUMBER: 2017347000201
HX PHOSPHORUS: 3.1 mg/dL — NL (ref 2.4–4.9)

## 2016-06-16 LAB — HX PROTEIN URINE
CASE NUMBER: 2017347000574
HX U PROTEIN: 28 mg/dL

## 2016-06-17 LAB — HX BASIC METABOLIC PANEL
CASE NUMBER: 2017348000167
HX ANION GAP: 9 — NL (ref 3.0–11.0)
HX BUN: 12 mg/dL — NL (ref 7.0–18.0)
HX CALCIUM LVL: 7.6 mg/dL — ABNORMAL LOW (ref 8.5–10.1)
HX CHLORIDE: 112 mmol/L — ABNORMAL HIGH (ref 98.0–110.0)
HX CO2: 24 mmol/L — NL (ref 21.0–32.0)
HX CREATININE: 1.01 mg/dL — NL (ref 0.55–1.3)
HX GLUCOSE LVL: 97 mg/dL — NL (ref 65.0–110.0)
HX POTASSIUM LVL: 3.7 mmol/L — NL (ref 3.6–5.2)
HX SODIUM LVL: 145 mmol/L — NL (ref 136.0–145.0)

## 2016-06-17 LAB — HX CBC W/ INDICES
CASE NUMBER: 2017348000167
HX HCT: 22.3 % — ABNORMAL LOW (ref 36.0–47.0)
HX HGB: 7.3 g/dL — ABNORMAL LOW (ref 11.8–15.8)
HX MCH: 37 pg — ABNORMAL HIGH (ref 27.0–31.0)
HX MCHC: 32.7 g/dL — NL (ref 32.0–36.0)
HX MCV: 114 fL — ABNORMAL HIGH (ref 81.0–99.0)
HX MPV: 9 fL — NL (ref 7.4–11.5)
HX NRBC PERCENT: 0 % — NL
HX PLATELET: 219 10*3/uL — NL (ref 150.0–400.0)
HX RBC: 1.96 10*6/uL — ABNORMAL LOW (ref 3.6–5.0)
HX RDW: 14.9 % — ABNORMAL HIGH (ref 11.5–14.5)
HX WBC: 5 10*3/uL — NL (ref 3.7–11.2)

## 2016-06-17 LAB — HX NT-PROBNP
CASE NUMBER: 2017348000167
HX NT-PROBNP: 9527 pg/mL — ABNORMAL HIGH (ref 0.0–900.0)

## 2016-06-17 LAB — HX PT
CASE NUMBER: 2017348000167
HX INR: 1
HX PT: 11.1 s — NL (ref 9.3–11.6)

## 2016-06-17 LAB — HX HEPATIC FUNCTION PANEL
CASE NUMBER: 2017348000167
HX ALBUMIN LVL: 2.2 g/dL — ABNORMAL LOW (ref 3.5–5.0)
HX ALKALINE PHOSPHATASE: 211 U/L — ABNORMAL HIGH (ref 45.0–117.0)
HX ALT: 37 U/L — NL (ref 6.0–78.0)
HX AST: 38 U/L — NL (ref 6.0–40.0)
HX BILIRUBIN DIRECT: 0.4 mg/dL — ABNORMAL HIGH (ref 0.0–0.3)
HX BILIRUBIN TOTAL: 0.7 mg/dL — NL (ref 0.2–1.2)
HX TOTAL PROTEIN: 5.6 g/dL — ABNORMAL LOW (ref 6.0–8.0)

## 2016-06-17 LAB — HX PHOSPHORUS LEVEL
CASE NUMBER: 2017348000167
HX PHOSPHORUS: 2.5 mg/dL — NL (ref 2.4–4.9)

## 2016-06-17 LAB — HX RPR
CASE NUMBER: 2017347000614
HX RPR QUAL: NONREACTIVE — NL

## 2016-06-17 LAB — HX URINE CULTURE
CASE NUMBER: 2017346001303
HX F: 40000
HX P: 20000

## 2016-06-17 LAB — HX GLOMERULAR FILTRATION RATE (ESTIMATED)
CASE NUMBER: 2017348000167
HX AFN AMER GLOMERULAR FILTRATION RATE: 68 mL/min/{1.73_m2}
HX NON-AFN AMER GLOMERULAR FILTRATION RATE: 59 mL/min/{1.73_m2}

## 2016-06-17 LAB — HX MAGNESIUM LEVEL
CASE NUMBER: 2017348000167
HX MAGNESIUM LVL: 2.2 mg/dL — NL (ref 1.8–2.4)

## 2016-06-17 LAB — HX PTT
CASE NUMBER: 2017348000167
HX PTT: 25.7 s — NL (ref 23.4–32.1)

## 2016-06-18 LAB — HX BASIC METABOLIC PANEL
CASE NUMBER: 2017349000113
HX ANION GAP: 9 — NL (ref 3.0–11.0)
HX BUN: 7 mg/dL — NL (ref 7.0–18.0)
HX CALCIUM LVL: 8 mg/dL — ABNORMAL LOW (ref 8.5–10.1)
HX CHLORIDE: 109 mmol/L — NL (ref 98.0–110.0)
HX CO2: 24 mmol/L — NL (ref 21.0–32.0)
HX CREATININE: 0.809 mg/dL — NL (ref 0.55–1.3)
HX GLUCOSE LVL: 102 mg/dL — NL (ref 65.0–110.0)
HX POTASSIUM LVL: 3.5 mmol/L — ABNORMAL LOW (ref 3.6–5.2)
HX SODIUM LVL: 142 mmol/L — NL (ref 136.0–145.0)

## 2016-06-18 LAB — HX HEPATIC FUNCTION PANEL
CASE NUMBER: 2017349000113
HX ALBUMIN LVL: 2.4 g/dL — ABNORMAL LOW (ref 3.5–5.0)
HX ALKALINE PHOSPHATASE: 198 U/L — ABNORMAL HIGH (ref 45.0–117.0)
HX ALT: 31 U/L — NL (ref 6.0–78.0)
HX AST: 27 U/L — NL (ref 6.0–40.0)
HX BILIRUBIN DIRECT: 0.4 mg/dL — ABNORMAL HIGH (ref 0.0–0.3)
HX BILIRUBIN TOTAL: 0.7 mg/dL — NL (ref 0.2–1.2)
HX TOTAL PROTEIN: 6.4 g/dL — NL (ref 6.0–8.0)

## 2016-06-18 LAB — HX PT
CASE NUMBER: 2017349000113
HX INR: 1
HX PT: 10.9 s — NL (ref 9.3–11.6)

## 2016-06-18 LAB — HX PHOSPHORUS LEVEL
CASE NUMBER: 2017349000113
HX PHOSPHORUS: 1.8 mg/dL — ABNORMAL LOW (ref 2.4–4.9)

## 2016-06-18 LAB — HX CBC W/ INDICES
CASE NUMBER: 2017349000113
HX HCT: 23.6 % — ABNORMAL LOW (ref 36.0–47.0)
HX HGB: 7.6 g/dL — ABNORMAL LOW (ref 11.8–15.8)
HX MCH: 36 pg — ABNORMAL HIGH (ref 27.0–31.0)
HX MCHC: 32.2 g/dL — NL (ref 32.0–36.0)
HX MCV: 112 fL — ABNORMAL HIGH (ref 81.0–99.0)
HX MPV: 9 fL — NL (ref 7.4–11.5)
HX NRBC PERCENT: 0 % — NL
HX PLATELET: 258 10*3/uL — NL (ref 150.0–400.0)
HX RBC: 2.1 10*6/uL — ABNORMAL LOW (ref 3.6–5.0)
HX RDW: 15.1 % — ABNORMAL HIGH (ref 11.5–14.5)
HX WBC: 6.6 10*3/uL — NL (ref 3.7–11.2)

## 2016-06-18 LAB — HX GLOMERULAR FILTRATION RATE (ESTIMATED)
CASE NUMBER: 2017349000113
HX AFN AMER GLOMERULAR FILTRATION RATE: 89 mL/min/{1.73_m2}
HX NON-AFN AMER GLOMERULAR FILTRATION RATE: 77 mL/min/{1.73_m2}

## 2016-06-18 LAB — HX MAGNESIUM LEVEL
CASE NUMBER: 2017349000113
HX MAGNESIUM LVL: 2 mg/dL — NL (ref 1.8–2.4)

## 2016-06-18 LAB — HX PTT
CASE NUMBER: 2017349000113
HX PTT: 25.5 s — NL (ref 23.4–32.1)

## 2016-06-18 LAB — HX NT-PROBNP
CASE NUMBER: 2017349000113
HX NT-PROBNP: 20826 pg/mL — ABNORMAL HIGH (ref 0.0–900.0)

## 2016-06-19 LAB — HX BLOOD CULTURE
CASE NUMBER: 2017346001304
CASE NUMBER: 2017346001305
HX F: NO GROWTH
HX F: NO GROWTH

## 2016-07-21 ENCOUNTER — Inpatient Hospital Stay
Admit: 2016-07-21 | Disposition: A | Discharge status: Home Health | Source: Home / Self Care | Attending: Internal Medicine | Admitting: Internal Medicine

## 2016-07-21 LAB — HX COMPREHENSIVE METABOLIC PANEL
CASE NUMBER: 2018017002188
HX ALBUMIN LVL: 2.1 g/dL — ABNORMAL LOW (ref 3.5–5.0)
HX ALKALINE PHOSPHATASE: 514 U/L — ABNORMAL HIGH (ref 45.0–117.0)
HX ALT: 129 U/L — ABNORMAL HIGH (ref 6.0–78.0)
HX ANION GAP: 13 — ABNORMAL HIGH (ref 3.0–11.0)
HX AST: 116 U/L — ABNORMAL HIGH (ref 6.0–40.0)
HX BILIRUBIN TOTAL: 2.2 mg/dL — ABNORMAL HIGH (ref 0.2–1.2)
HX BUN: 22 mg/dL — ABNORMAL HIGH (ref 7.0–18.0)
HX CALCIUM LVL: 7.5 mg/dL — ABNORMAL LOW (ref 8.5–10.1)
HX CHLORIDE: 102 mmol/L — NL (ref 98.0–110.0)
HX CO2: 22 mmol/L — NL (ref 21.0–32.0)
HX CREATININE: 2.95 mg/dL — ABNORMAL HIGH (ref 0.55–1.3)
HX GLUCOSE LVL: 135 mg/dL — ABNORMAL HIGH (ref 65.0–110.0)
HX POTASSIUM LVL: 2.4 mmol/L — CR (ref 3.6–5.2)
HX SODIUM LVL: 137 mmol/L — NL (ref 136.0–145.0)
HX TOTAL PROTEIN: 6.1 g/dL — NL (ref 6.0–8.0)

## 2016-07-21 LAB — HX CBC W/ DIFF
CASE NUMBER: 2018017002188
HX HCT: 24.5 % — ABNORMAL LOW (ref 36.0–47.0)
HX HGB: 8.2 g/dL — ABNORMAL LOW (ref 11.8–15.8)
HX MCH: 36 pg — ABNORMAL HIGH (ref 27.0–31.0)
HX MCHC: 33.5 g/dL — NL (ref 32.0–36.0)
HX MCV: 107 fL — ABNORMAL HIGH (ref 81.0–99.0)
HX MPV: 10 fL — NL (ref 7.4–11.5)
HX NRBC PERCENT: 0 % — NL
HX PLATELET: 289 10*3/uL — NL (ref 150.0–400.0)
HX RBC: 2.29 10*6/uL — ABNORMAL LOW (ref 3.6–5.0)
HX RDW: 13.6 % — NL (ref 11.5–14.5)
HX WBC: 10.4 10*3/uL — NL (ref 3.7–11.2)

## 2016-07-21 LAB — HX .AUTOMATED DIFF
CASE NUMBER: 2018017002188
HX ABSOLUTE NEUTRO COUNT: 8120 /mm3
HX BASOPHILS: 0 % — NL (ref 0.0–1.0)
HX EOSINOPHILS: 1 % — NL (ref 0.0–3.0)
HX IMMATURE GRANULOCYTES: 1 % — NL (ref 0.0–2.0)
HX LYMPHOCYTES: 11 % — ABNORMAL LOW (ref 22.0–40.0)
HX MONOCYTES: 9 % — NL (ref 0.0–11.0)
HX NEU CT MEASURED: 8.12
HX NEUTROPHILS: 78 % — ABNORMAL HIGH (ref 40.0–71.0)

## 2016-07-21 LAB — HX GLOMERULAR FILTRATION RATE (ESTIMATED)
CASE NUMBER: 2018017002188
HX AFN AMER GLOMERULAR FILTRATION RATE: 19 mL/min/{1.73_m2}
HX NON-AFN AMER GLOMERULAR FILTRATION RATE: 16 mL/min/{1.73_m2}

## 2016-07-21 LAB — HX BLUE TOP TO HOLD: CASE NUMBER: 2018017002188

## 2016-07-21 LAB — HX SST GOLD TUBE TO HOLD: CASE NUMBER: 2018017002188

## 2016-07-21 LAB — HX TROPONIN I
CASE NUMBER: 2018017002188
HX TROPONIN I: <0.015 — NL (ref 0.015–0.045)

## 2016-07-21 NOTE — Progress Notes (Signed)
Subjective    No acute issues overnight. Now abdominal pain is much better.    Review of Systems    Objective    Vitals & Measurements    **T:** 97.5 F (Oral) **TMIN:** 97.3 F (Oral) **TMAX:** 98.3 F (Oral)  **HR:** 82 **RR:** 18 **BP:** 105/68 **SpO2:** 100% **O2 Rate:** 2 **O2 Method  (L/min):** Room air    Physical Exam    General: Alert and oriented, no acute distress    Eye: PERRL, Normal Conjunctiva    HEENT: Normocephalic, no damage to dentition, moist oral mucosa, no pharyngeal  erythema, no sinus tenderness    Neck: supple, nontender, carotid pulse WNL, no carotid bruit, no JVD, no  lymphadenopathy, no thyromegaly, full ROM    Respiratory: Lungs clear to auscultation, respirations non-labored, breath  sounds equal and regular, symmetrical chest expansion, no chest wall  tenderness    Cardiovascular: Normal Rate, normal rhythm, _ beats/minute, no gallop, good  pulses equal in all extremities, normal peripheral perfusion, no edema.    Gastrointestinal: Abdomen soft, non tender, non-distended, normal bowel sounds  all four quadrants, no organomegaly, Drain noted.    Musculoskeletal: normal ROM, normal strength, no tenderness, no swelling, no  deformity, normal gait    Integumentary: Skin intact, warm, pink, dry. No pallor, no rash.    Neurologic: Alert, Oriented, normal sensory, normal motor, no focal deficits.      Medications    _Inpatient_    albuterol 2.5 mg/3 mL (0.083%) inhalation solution, 2.5 mg= 3 mL, INHAL, q4hr,  PRN    amitriptyline, 50 mg= 1 tab(s), PO, HS    budesonide 0.5 mg/2 mL inhalation suspension, 0.5 mg= 2 mL, NEB, BID    clonazePAM, 1 mg= 1 tab(s), PO, BID    Cymbalta, 60 mg= 2 cap(s), PO, Daily    Dextrose 50% For Protocol, per protocol, IV Push, ud, PRN    formoterol 20 mcg/2 mL inhalation solution, 20 mcg= 2 mL, NEB, BID    glucagon, 1 mg= 1 EA, IM, ud, PRN    glucose 40% oral gel, 37.5 Gm= 1 EA, PO, ud, PRN    HumuLIN R Low, 0-12 Unit(s), sc, QIDACHS    Latuda, 120 mg= 6  tab(s), PO, HS    Metoprolol Tartrate (Lopressor), 50 mg= 1 tab(s), PO, BID    MiraLax oral powder for reconstitution, 17 Gm= 1 EA, PO, Daily, PRN    ondansetron, 4 mg= 2 mL, IV Push, q8hr, PRN    oxyCODONE, 10 mg= 2 tab(s), PO, TID, PRN    Protonix, 40 mg= 1 tab(s), PO, ACB    Reglan, 10 mg= 2 mL, IV Push, q6hr, PRN    Spiriva, 18 mcg= 1 cap(s), INHAL, Daily    Zosyn    _Home_    amitriptyline, 50 mg, PO, HS    amLODIPine, 10 mg, PO, Daily    atenolol, 50 mg, PO, BID    Breo Ellipta 100 mcg-25 mcg/inh inhalation powder, 1 puff(s), INHAL, Daily    clonazePAM 1 mg oral tablet, 1 mg, PO, BID    Cymbalta 60 mg oral delayed release capsule, 60 mg= 1 cap(s), PO, Daily    ferrous gluconate 325 mg (36 mg elemental iron) oral tablet, 325 mg= 1 tab(s),  PO, Daily    furosemide 40 mg oral tablet, See Instructions    furosemide 40 mg oral tablet, 40 mg= 1 tab(s), PO, Daily    Latuda, 120 mg, PO, HS    lisinopril, 40 mg, PO, Daily  omeprazole, 20 mg, PO, BID    oxyCODONE 10 mg oral tablet, 10 mg= 1 tab(s), PO, BID    Spiriva 18 mcg inhalation capsule, 18 mcg= 1 cap(s), INHAL, Daily    tiZANidine 4 mg oral tablet, 8 mg= 2 tab(s), PO, Daily    tiZANidine 4 mg oral tablet, 4 mg= 1 tab(s), PO, BID    Vitamin B12 250 mcg oral tablet, 250 mcg= 1 tab(s), PO, Daily    Vitamin D3, 1000 Unit(s), PO, Daily    Lab Results    Blood Glucose POC: 101 mg/dL (16/10/96 04:54:09)    Blood Glucose Testing Reason: Routine capillary blood sugar (08/07/16  11:48:44)    Glucose Lvl: 73 mg/dL (81/19/14 78:29:56)    BUN: 4 mg/dL Low (21/30/86 57:84:69)    Creatinine: 0.719 mg/dL (62/95/28 41:32:44)    Afn Amer Glomerular Filtration Rate: >90 (08/07/16 06:58:38)    Non-Afn Amer Glomerular Filtration Rate: 89 ml/min/1.49m2 (08/07/16 06:58:36)    Sodium Lvl: 145 mmol/L (08/07/16 06:58:34)    Potassium Lvl: 4 mmol/L (08/07/16 06:58:34)    Chloride: 113 mmol/L High (08/07/16 06:58:34)    CO2: 25 mmol/L (08/07/16 06:58:34)    Anion Gap: 7 (08/07/16  06:58:34)    Total Protein: 5 Gm/dL Low (07/06/70 53:66:44)    Albumin Lvl: 1.8 Gm/dL Low (03/47/42 59:56:38)    Calcium Lvl: 7.5 mg/dL Low (75/64/33 29:51:88)    Magnesium Lvl: 2.1 mg/dL (41/66/06 30:16:01)    Bilirubin Total: 1.1 mg/dL (09/32/35 57:32:20)    Bilirubin Direct: 0.9 mg/dL High (25/42/70 62:37:62)    Alkaline Phosphatase: 194 Units/L High (08/07/16 06:58:35)    AST: 18 Units/L (08/07/16 06:58:35)    ALT: 13 Units/L (08/07/16 06:58:35)    WBC: 8.4 thous/mm3 (08/07/16 07:52:04)    RBC: 2.5 Mil/mm3 Low (08/07/16 07:52:04)    Hgb: 8.5 Gm/dL Low (83/15/17 61:60:73)    Hct: 26.7 % Low (08/07/16 07:52:04)    Platelet: 420 thous/mm3 High (08/07/16 07:52:04)    MCV: 107 fL High (08/07/16 07:52:04)    MCH: 34 pGm High (08/07/16 07:52:04)    MCHC: 31.8 Gm/dL Low (71/06/26 94:85:46)    RDW: 17.2 % High (08/07/16 07:52:04)    MPV: 9.1 fL (08/07/16 07:52:04)    NRBC Percent: 0 % (08/07/16 07:52:04)          Diagnostic Results    Impression and Plan    66 year female with history of            COPD    Hypertension    Bipolar    Recurrent falls        presented to the ER with complaints of dizziness and weakness . Patient  has  been complaining of worsening nausea and had few episodes of    non-bloody vomiting. When the EMS arrived, they found her with blood pressure  of 70/40.  On arrival to the Emergency Department, she was hypotensive with  blood pressure 75/41.    with weakness and hypotension found to have AKI.            Acute Renal Injury/Acute Liver Failure secondary to Hypotension and Shock with  underlying Sepsis/hypoperfusion - Resolved        Sepsis due to cholangitis / choledocholithiasis 72m -Resolved. Follow up  cholangiogram 2/1- obstruction of the Ampulla, dilated and obstruction  resolved.    - 2/02 had leaking - IR upsized and stitched with no further leaking.Patient  was noted to have bacteremia due to klebsiella. Continue IV antibiotics  Bipolar disorder: Stable.        COPD -stable.  Continue bronchodilators        Dispo: rehab versus home with services.    SIGNATURE LINE Electronically signed by Zane Herald MD-HOSP, Shamikia Linskey on 08/07/2016 at  16:36:21 EST

## 2016-07-21 NOTE — Progress Notes (Signed)
Subjective    "I feel so much better since the procedure" Had leaking from chole T-tube  drain around insertion site. IR procedure today and bigger diameter tube  placed. Lying in bed, eating some food, encouraged to eat sitting up. IR  waiting for bilirubin to decrease and drainage output to decrease before  internalizing the tube.    Patient examined, chart reviewed, and discussed plan of care with healthcare  team.    Review of Systems    All systems have been reviewed and are negative unless identified in the HPI.    Objective    Vitals & Measurements    **T:** 98.3 F (Oral) **TMIN:** 97.3 F (Oral) **TMAX:** 98.9 F (Oral)  **HR:** 82 **RR:** 18 **BP:** 150/93 **SpO2:** 98% **O2 Rate:** 2 **O2 Method  (L/min):** Room air    Physical Exam    General: Mild distress, asking appropriate questions, lying in bed, less pain  and no nausea.    Neuro: A & O X 3. No focal deficits. Calm, following commands    Pulmonary: CTAB, no cough or wheeze. Non-labored respirations on Room Air.    Cardiac: Reg rate. No chest pain or shortness of breath. Bil LE edema    GI/GU: +BS, abd soft, Tender at tube insertion site. Distension. BM 1/01, soft  solid. U/O: Voiding in BR    Skin: warm and dry. No rash    Musculoskeletal: MAE, Independent ambulation , working with PT    Medications    _Inpatient_    albuterol 2.5 mg/3 mL (0.083%) inhalation solution, 2.5 mg= 3 mL, INHAL, q4hr,  PRN    amitriptyline, 50 mg= 1 tab(s), PO, HS    budesonide 0.5 mg/2 mL inhalation suspension, 0.5 mg= 2 mL, NEB, BID    clonazePAM, 1 mg= 1 tab(s), PO, BID    Cymbalta, 60 mg= 2 cap(s), PO, Daily    Dextrose 50% For Protocol, per protocol, IV Push, ud, PRN    formoterol 20 mcg/2 mL inhalation solution, 20 mcg= 2 mL, NEB, BID    glucagon, 1 mg= 1 EA, IM, ud, PRN    glucose 40% oral gel, 37.5 Gm= 1 EA, PO, ud, PRN    HumuLIN R Low, 0-12 Unit(s), sc, QIDACHS    Latuda, 120 mg= 6 tab(s), PO, HS    Metoprolol Tartrate (Lopressor), 50 mg= 1 tab(s), PO,  BID    MiraLax oral powder for reconstitution, 17 Gm= 1 EA, PO, Daily, PRN    ondansetron, 4 mg= 2 mL, IV Push, q8hr, PRN    oxyCODONE, 10 mg= 2 tab(s), PO, TID, PRN    Protonix, 40 mg= 1 tab(s), PO, ACB    Reglan, 10 mg= 2 mL, IV Push, q6hr, PRN    Spiriva, 18 mcg= 1 cap(s), INHAL, Daily    Zosyn    _Home_    amitriptyline, 50 mg, PO, HS    amLODIPine, 10 mg, PO, Daily    atenolol, 50 mg, PO, BID    Breo Ellipta 100 mcg-25 mcg/inh inhalation powder, 1 puff(s), INHAL, Daily    clonazePAM 1 mg oral tablet, 1 mg, PO, BID    Cymbalta 60 mg oral delayed release capsule, 60 mg= 1 cap(s), PO, Daily    ferrous gluconate 325 mg (36 mg elemental iron) oral tablet, 325 mg= 1 tab(s),  PO, Daily    furosemide 40 mg oral tablet, See Instructions    furosemide 40 mg oral tablet, 40 mg= 1 tab(s), PO, Daily    Latuda, 120  mg, PO, HS    lisinopril, 40 mg, PO, Daily    omeprazole, 20 mg, PO, BID    oxyCODONE 10 mg oral tablet, 10 mg= 1 tab(s), PO, BID    Spiriva 18 mcg inhalation capsule, 18 mcg= 1 cap(s), INHAL, Daily    tiZANidine 4 mg oral tablet, 8 mg= 2 tab(s), PO, Daily    tiZANidine 4 mg oral tablet, 4 mg= 1 tab(s), PO, BID    Vitamin B12 250 mcg oral tablet, 250 mcg= 1 tab(s), PO, Daily    Vitamin D3, 1000 Unit(s), PO, Daily    Lab Results    Blood Glucose POC: 129 mg/dL High (16/10/96 04:54:09)    Blood Glucose Testing Reason: Routine capillary blood sugar (08/06/16  21:42:16)    Glucose Lvl: 92 mg/dL (81/19/14 78:29:56)    BUN: 4 mg/dL Low (21/30/86 57:84:69)    Creatinine: 0.714 mg/dL (62/95/28 41:32:44)    Afn Amer Glomerular Filtration Rate: >90 (08/06/16 07:58:42)    Non-Afn Amer Glomerular Filtration Rate: 90 ml/min/1.46m2 (08/06/16 07:58:41)    Sodium Lvl: 143 mmol/L (08/06/16 07:58:38)    Potassium Lvl: 3.5 mmol/L Low (08/06/16 07:58:38)    Chloride: 110 mmol/L (08/06/16 07:58:38)    CO2: 25 mmol/L (08/06/16 07:58:38)    Anion Gap: 8 (08/06/16 07:58:38)    Total Protein: 5.7 Gm/dL Low (07/06/70 53:66:44)    Albumin  Lvl: 1.9 Gm/dL Low (03/47/42 59:56:38)    Calcium Lvl: 7.7 mg/dL Low (75/64/33 29:51:88)    Magnesium Lvl: 2.2 mg/dL (41/66/06 30:16:01)    Bilirubin Total: 1.2 mg/dL (09/32/35 57:32:20)    Bilirubin Direct: 0.9 mg/dL High (25/42/70 62:37:62)    Alkaline Phosphatase: 213 Units/L High (08/06/16 07:58:39)    AST: 17 Units/L (08/06/16 07:58:39)    ALT: 16 Units/L (08/06/16 07:58:39)    WBC: 11.9 thous/mm3 High (08/06/16 07:33:40)    RBC: 2.65 Mil/mm3 Low (08/06/16 07:33:40)    Hgb: 9 Gm/dL Low (83/15/17 61:60:73)    Hct: 27.9 % Low (08/06/16 07:33:40)    Platelet: 417 thous/mm3 High (08/06/16 07:33:40)    MCV: 105 fL High (08/06/16 07:33:40)    MCH: 34 pGm High (08/06/16 07:33:40)    MCHC: 32.3 Gm/dL (71/06/26 94:85:46)    RDW: 17 % High (08/06/16 07:33:40)    MPV: 9 fL (08/06/16 07:33:40)    NRBC Percent: 0 % (08/06/16 07:33:40)    Diagnostic Results    AB CT - 08/03/2016    FINDINGS:        BONES: There is scoliosis noted along with multilevel lumbar degenerative  change    including severe facet degeneration with grade 2 anterolisthesis at L4-5.  There    is fusion of the vertebral bodies at T9-10 and there is mild anterior wedge    vertebral compression deformity at T12, unchanged. There are multiple healed  rib    fractures on the RIGHT. There are NO suspicious osseous findings.        LOWER CHEST: There is mild dependent atelectasis. There is a small RIGHT  pleural    effusion.        PERITONEUM: There is no pneumatosis, mesenteric venous gas or free air. There  is    no free fluid or organized extraluminal fluid collection. There is no    significant stranding in the peritoneal fat.        GI TRACT: Colonic diverticula are noted with no evidence of diverticulitis.    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 There is no abnormal bowel  distention or wall thickening. The appendix is not visualized.        HEPATOBILIARY: There is a percutaneous biliary drainage catheter entering from    the RIGHT. The liver  measures only 17 Hounsfield units on this postcontrast    examination, compatible with severe hepatic steatosis. The gallbladder is    surgically absent. The hepatobiliary structures appear otherwise unremarkable    for technique.        PANCREAS: The pancreas appears within normal limits for technique.        SPLEEN: The spleen is enlarged measuring 15.5 cm in length. No focal splenic    lesions are seen.        ADRENALS: The adrenal glands appear within normal limits.        GU: The kidneys enhance symmetrically and appear within normal limits for    technique with no contour deforming solid masses or hydronephrosis.        VASCULAR: There is mild scattered atherosclerotic disease. There is no  abdominal    aortic aneurysm. The portal vein, splenic vein and main mesenteric veins are    patent.        LYMPH NODES: There are scattered shotty lymph nodes with no adenopathy by size    criteria.        PELVIS: Prior total abdominal hysterectomy with BILATERAL salpingo-  oophorectomy.        ____________________________________________        IMPRESSION:        1\. Indwelling percutaneous biliary drainage catheter. Severe hepatic  steatosis.    No bile duct dilatation appreciated. Prior cholecystectomy.        2\. Mild splenomegaly.        3\. Small but enlarging RIGHT pleural effusion with thin pleural enhancement.    Please correlate clinically.        4\. Colonic diverticulosis and evidence of prior gastric bypass surgery.        5\. There is otherwise NO evidence of abscess.    ____________________________________________        IR Cholangiogram Injection - 08/05/2016        INDINGS: Contrast opacifies a decompressed intrahepatic biliary system as the    catheter has pulled back into a right lobe radical. There is obstruction at  the    level of the ampulla.        INTERVENTION: Over a 0.035 inch guidewire, the indwelling tube was exchanged  for    a 7 [place] long vascular sheath which was advanced over a 0.035 inch  guidewire    into the distal common duct. Contrast injection was performed and again  revealed    obstruction at the level of the ampulla. No fixed filling defect is identified    to suggest residual stone.        Through the 7 French sheath, under direct fluoroscopic guidance, I advanced an    endoscopic brush and took brushings of the ampulla. The brush was removed        Contrast injection confirms tube patency. The catheter    was sutured into place and an occlusive dressing applied. It was replaced to    gravity drainage.        IMPRESSION:    1\. Biliary drain injection confirms malpositioning of the drain having been    pulled back into a right hepatic biliary radicle. 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    2\. Brush  biopsy of ampulla.    3\. Replacement of 8 French internal/external biliary drain as described.        [1]        IMPRESSION and PLAN:    Chronic back pain        Falls        Hypokalemia        Hypotension        Weakness            66 y/o with PMH of COPD, hypertension, HFrEF, Bipolar, recurrent falls, and  concern for substance abuse in the past who presents to ED    with weakness and hypotension found to have AKI.            #Acute Renal Injury/Acute Liver Failure secondary to Hypotension and Shock  with underlying Sepsis/hypoperfusion - Resolved    - Continue to monitor BMP        #Sepsis due to cholangitis / choledocholithiasis 52m -Resolved    -with bacteremia due to klebsiella, biliary course    -appreciate ID consult, given nausea Re-start IV zosyn - DC Cipro and Flagyl    -patient had MRCP with successful stone removal with IR    -drain in place, internal drainage scheduled for when output decreases and bilirubin levels are normal    -follow up cholangiogram 2/1- obstruction of the Ampulla, dilated and obstruction resolved.     - 2/02 had leaking - IR upsized and stitched with no further leaking    - PO Oxycodone TID, PRN        #Vaginitis: Having increased  discomfort in peri area and labia areas - No  further discomfort    - PO Fluconazole given x 1 150mg  for possible yeast infection on 1/30        #Acute electrolyte derangement - Potassium 3.6    - Replete as needed    -ctm        #Anemia - no apparent blood loss. H & H= 9.9/31.7    -acute on chronic    -Transfused 2 units of prbc's on 1/24    -Continue to monitor H & H        #Bipolar - Stable    -On Cymbalta and Amitriptyline    -Qtc on 1/17 = 511, Repeat ECG Qtc 455 on 2/01    - Psych input appreciated - No changes in medications per patient request        #Recurrent falls    -will work with PT for discharging planning    -she is deferring rehab        #COPD -stable    -Continue bronchodilators    -Wears Oxygen PRN        PPX: DVT: Sequential boots when resting in bed, Early ambulation    GI: Pantoprazole    Dispo: Ongoing treatment of Bacteremia and cholangitis with IV Zosyn and  cholangio tube replaced 2/01. Not able to have tube internalized until  bilirubin normalizes - not sure if will be this admission. Cholangiogram  revealed obstruction of the Ampulla, dilated and obstruction relieved. Less  pain and nausea. To IR on 2/02 for T-tube upsizing to eliminate leaking.  Refusing rehab, will continue to assess discharge needs. COC involved.    [1]    [1] Progress/SOAP Note; Johnathan Hausen NP, Claris Che 08/05/2016 13:34 EST    SIGNATURE LINE Electronically signed by Brynda Peon MD, [place] on 08/07/2016 at  41:66:06 EST

## 2016-07-21 NOTE — Progress Notes (Signed)
Subjective    Pt seen and examined and chart reviewed.    Sitting up in chair, answers questions appropriately.    No events overnight.    Objective    Vitals & Measurements    **T:** 98.7 F (Oral) **TMIN:** 98.0 F (Oral) **TMAX:** 98.7 F (Oral)  **HR:** 91 **RR:** 27 **BP:** 129/87 **SpO2:** 100% **O2 Method (L/min):**  Room air **WT:** 92.1 Kg    Physical Exam    Gen: Sitting in bedside chair in no distress.    HEENT: NC.    Neck: Supple.    CV: RRR, S1/S2.    Lungs: CTA bilaterally, no wheezing, rhonchi or rales.    Abd: Round, soft, NT/ND.    Extrems/Integ: WWP, trace edema.    Neuro: AAO x 3.      Medications    _Inpatient_    albuterol 2.5 mg/3 mL (0.083%) inhalation solution, 2.5 mg= 3 mL, INHAL, q4hr,  PRN    amitriptyline, 50 mg= 1 tab(s), PO, HS    budesonide 0.5 mg/2 mL inhalation suspension, 0.5 mg= 2 mL, NEB, BID    clonazePAM, 1 mg= 1 tab(s), PO, BID    Cymbalta, 60 mg= 2 cap(s), PO, Daily    Dextrose 50% For Protocol, per protocol, IV Push, ud, PRN    formoterol 20 mcg/2 mL inhalation solution, 20 mcg= 2 mL, NEB, BID    glucagon, 1 mg= 1 EA, IM, ud, PRN    glucose 40% oral gel, 37.5 Gm= 1 EA, PO, ud, PRN    heparin, 5000 Unit(s)= 1 mL, sc, q8hr    HumuLIN R Low, 0-12 Unit(s), sc, QIDACHS    Latuda, 120 mg= 6 tab(s), PO, HS    Neutra-Phos, 250 mg= 1 EA, PO, BID    ondansetron, 4 mg= 2 mL, IV Push, q8hr, PRN    oxyCODONE, 5 mg= 1 tab(s), PO, q6hr, PRN    Protonix, 40 mg= 1 tab(s), PO, ACB    Spiriva, 18 mcg= 1 cap(s), INHAL, Daily    Tylenol 325 mg oral tablet, 650 mg= 2 tab(s), PO, q4hr, PRN    _Home_    amitriptyline, 50 mg, PO, HS    amLODIPine, 10 mg, PO, Daily    atenolol, 50 mg, PO, BID    Breo Ellipta 100 mcg-25 mcg/inh inhalation powder, 1 puff(s), INHAL, Daily    clonazePAM 1 mg oral tablet, 1 mg, PO, BID    Cymbalta 60 mg oral delayed release capsule, 60 mg= 1 cap(s), PO, Daily    ferrous gluconate 325 mg (36 mg elemental iron) oral tablet, 325 mg= 1 tab(s),  PO, Daily    furosemide  40 mg oral tablet, See Instructions    furosemide 40 mg oral tablet, 40 mg= 1 tab(s), PO, Daily    Latuda, 120 mg, PO, HS    lisinopril, 40 mg, PO, Daily    omeprazole, 20 mg, PO, BID    oxyCODONE 10 mg oral tablet, 10 mg= 1 tab(s), PO, BID    Spiriva 18 mcg inhalation capsule, 18 mcg= 1 cap(s), INHAL, Daily    tiZANidine 4 mg oral tablet, 8 mg= 2 tab(s), PO, Daily    tiZANidine 4 mg oral tablet, 4 mg= 1 tab(s), PO, BID    Vitamin B12 250 mcg oral tablet, 250 mcg= 1 tab(s), PO, Daily    Vitamin D3, 1000 Unit(s), PO, Daily    Lab Results    Blood Glucose POC: 243 mg/dL High (24/40/10 27:25:36)    Glucose Lvl: 90 mg/dL (  07/23/16 07:24:53)    BUN: 13 mg/dL (16/10/96 04:54:09)    Creatinine: 1.16 mg/dL (81/19/14 78:29:56)    Afn Amer Glomerular Filtration Rate: 58 ml/min/1.54m2 (07/23/16 07:24:56)    Non-Afn Amer Glomerular Filtration Rate: 50 ml/min/1.26m2 (07/23/16 07:24:57)    Sodium Lvl: 147 mmol/L High (07/23/16 07:24:53)    Potassium Lvl: 3.5 mmol/L Low (07/23/16 07:24:53)    Chloride: 119 mmol/L High (07/23/16 07:24:53)    CO2: 17 mmol/L Low (07/23/16 07:24:53)    Anion Gap: 11 (07/23/16 07:24:53)    Total Protein: 5.6 Gm/dL Low (21/30/86 57:84:69)    Albumin Lvl: 1.8 Gm/dL Low (62/95/28 41:32:44)    Calcium Lvl: 7.2 mg/dL Low (07/06/70 53:66:44)    Phosphorus: 1.9 mg/dL Low (03/47/42 59:56:38)    Magnesium Lvl: 2 mg/dL (75/64/33 29:51:88)    Bilirubin Total: 1.1 mg/dL (41/66/06 30:16:01)    Bilirubin Direct: 0.8 mg/dL High (09/32/35 57:32:20)    Alkaline Phosphatase: 419 Units/L High (07/23/16 07:24:54)    AST: 55 Units/L High (07/23/16 07:24:54)    ALT: 83 Units/L High (07/23/16 07:24:54)    WBC: 5.2 thous/mm3 (07/23/16 07:08:55)    RBC: 2.21 Mil/mm3 Low (07/23/16 07:08:55)    Hgb: 7.8 Gm/dL Low (25/42/70 62:37:62)    Hct: 24.5 % Low (07/23/16 07:08:55)    Platelet: 271 thous/mm3 (07/23/16 07:08:55)    MCV: 111 fL High (07/23/16 07:08:55)    MCH: 35 pGm High (07/23/16 07:08:55)    MCHC: 31.8 Gm/dL Low (83/15/17  61:60:73)    RDW: 13.6 % (07/23/16 07:08:55)    MPV: 9.6 fL (07/23/16 07:08:55)    NRBC Percent: 0 % (07/23/16 07:08:55)          Impression and Plan    66 y/o with PMH of COPD, hypertension, HFrEF, Bipolar, recurrent falls, and  concern for substance abuse in the past who presents to ED with weakness and  hypotension found to have AKI.        Problems:    1\. Hypotension - ?medication related.    2\. AKI likely pre-renal 2/2 hypotension.    3\. Elevated transaminases/bilirubins.    4\. Hypokalemia/hypophosphatemia.    5\. Anemia.    6\. Bipolar.    7\. Recurrent falls.    8\. COPD not in exacerbation.        Plan:    - CT head negative.    - Has h/o recurrent falls and presentations for hypotension, ?if is med  related. PT eval.    - Is on large doses of tizanidine which are associated with weakness and  dizziness; continue very low dose of this to prevent withdrawal but would  favor weaning vs only using low doses.    - Continue psych meds - cymbalta, latuda, amitriptylline.    - Per social work who spoke with elder services and [facility], pt's meds are in  lock box and distributed to pt daily making abuse/misuse unlikely.    - Psych consult pending.    - Social work consult pending.    - Hypotension fluid responsive, never required vasopressors.    - As outpatient is on lisinopril 40mg  daily, atenolol 50 mg BID, and  amlodipine 10mg ; Currently off of all her antihypertensives, may need to re-  initiate slowly. ? if she needs all.    - Has h/o CAD, ECHO 12/17 with WMA, LV EF 50-55%.    - TNI negative.    - AST/ALT and TBili elevation likely shock phenomena. Abd U/S with fatty  liver, otherwise normal. Continue  with supportive care. Pt tolerates diet  well.        C/w tele today.      SIGNATURE LINE Electronically signed by Felipa Furnace MD (HOSP), Kemontae Dunklee on  07/23/2016 at 14:45:42 EST

## 2016-07-21 NOTE — Progress Notes (Signed)
Subjective    History and chart reviewed. lab and images reviewed    pt seen and examined    discussed with nursing staff    sitting on chair, denies dizziness    no chest pain or sob    Objective    Vitals & Measurements    **T:** 98.0 F (Oral) **TMIN:** 98.0 F (Oral) **TMAX:** 98.7 F (Oral)  **HR:** 85 **RR:** 16 **BP:** 120/98 **SpO2:** 97% **O2 Method (L/min):** Room  air **WT:** 91 Kg    Physical Exam        Respiratory: Lungs are clear to auscultation, Breath sounds are equal.    Cardiovascular: Regular rate and rhythm.    Gastrointestinal: Soft, Non-tender, Non-distended    Neurologic: awake, alert    Psychiatric: Cooperative      Medications    _Inpatient_    albuterol 2.5 mg/3 mL (0.083%) inhalation solution, 2.5 mg= 3 mL, INHAL, q4hr,  PRN    amitriptyline, 50 mg= 1 tab(s), PO, HS    budesonide 0.5 mg/2 mL inhalation suspension, 0.5 mg= 2 mL, NEB, BID    clonazePAM, 1 mg= 1 tab(s), PO, BID    Cymbalta, 60 mg= 2 cap(s), PO, Daily    Dextrose 50% For Protocol, per protocol, IV Push, ud, PRN    formoterol 20 mcg/2 mL inhalation solution, 20 mcg= 2 mL, NEB, BID    glucagon, 1 mg= 1 EA, IM, ud, PRN    glucose 40% oral gel, 37.5 Gm= 1 EA, PO, ud, PRN    heparin, 5000 Unit(s)= 1 mL, sc, q8hr    HumuLIN R Low, 0-12 Unit(s), sc, QIDACHS    Latuda, 120 mg= 6 tab(s), PO, HS    ondansetron, 4 mg= 2 mL, IV Push, q8hr, PRN    oxyCODONE, 5 mg= 1 tab(s), PO, q6hr, PRN    Protonix, 40 mg= 1 tab(s), PO, ACB    Spiriva, 18 mcg= 1 cap(s), INHAL, Daily    Tylenol 325 mg oral tablet, 650 mg= 2 tab(s), PO, q4hr, PRN    _Home_    amitriptyline, 50 mg, PO, HS    amLODIPine, 10 mg, PO, Daily    atenolol, 50 mg, PO, BID    Breo Ellipta 100 mcg-25 mcg/inh inhalation powder, 1 puff(s), INHAL, Daily    clonazePAM 1 mg oral tablet, 1 mg, PO, BID    Cymbalta 60 mg oral delayed release capsule, 60 mg= 1 cap(s), PO, Daily    ferrous gluconate 325 mg (36 mg elemental iron) oral tablet, 325 mg= 1 tab(s),  PO, Daily    furosemide 40  mg oral tablet, See Instructions    furosemide 40 mg oral tablet, 40 mg= 1 tab(s), PO, Daily    Latuda, 120 mg, PO, HS    lisinopril, 40 mg, PO, Daily    omeprazole, 20 mg, PO, BID    oxyCODONE 10 mg oral tablet, 10 mg= 1 tab(s), PO, BID    Spiriva 18 mcg inhalation capsule, 18 mcg= 1 cap(s), INHAL, Daily    tiZANidine 4 mg oral tablet, 8 mg= 2 tab(s), PO, Daily    tiZANidine 4 mg oral tablet, 4 mg= 1 tab(s), PO, BID    Vitamin B12 250 mcg oral tablet, 250 mcg= 1 tab(s), PO, Daily    Vitamin D3, 1000 Unit(s), PO, Daily    Lab Results    Blood Glucose POC: 92 mg/dL (16/10/96 04:54:09)    Glucose Lvl: 77 mg/dL (81/19/14 78:29:56)    BUN: 10 mg/dL (21/30/86 57:84:69)  Creatinine: 0.806 mg/dL (16/10/96 04:54:09)    Afn Amer Glomerular Filtration Rate: 89 ml/min/1.77m2 (07/24/16 07:12:52)    Non-Afn Amer Glomerular Filtration Rate: 77 ml/min/1.66m2 (07/24/16 07:12:53)    Sodium Lvl: 148 mmol/L High (07/24/16 07:12:49)    Potassium Lvl: 4.1 mmol/L (07/24/16 07:12:49)    Chloride: 119 mmol/L High (07/24/16 07:12:49)    CO2: 18 mmol/L Low (07/24/16 07:12:49)    Anion Gap: 11 (07/24/16 07:12:49)    Total Protein: 5 Gm/dL Low (81/19/14 78:29:56)    Albumin Lvl: 1.8 Gm/dL Low (21/30/86 57:84:69)    Calcium Lvl: 7.2 mg/dL Low (62/95/28 41:32:44)    Magnesium Lvl: 1.9 mg/dL (07/06/70 53:66:44)    Bilirubin Total: 0.9 mg/dL (03/47/42 59:56:38)    Bilirubin Direct: 0.6 mg/dL High (75/64/33 29:51:88)    Alkaline Phosphatase: 378 Units/L High (07/24/16 07:12:51)    AST: 44 Units/L High (07/24/16 07:12:51)    ALT: 67 Units/L (07/24/16 07:12:51)    WBC: 4.4 thous/mm3 (07/24/16 06:39:38)    RBC: 2.05 Mil/mm3 Low (07/24/16 06:39:38)    Hgb: 7.2 Gm/dL Low (41/66/06 30:16:01)    Hct: 23.4 % Low (07/24/16 06:39:38)    Platelet: 223 thous/mm3 (07/24/16 06:39:38)    MCV: 114 fL High (07/24/16 06:39:38)    MCH: 35 pGm High (07/24/16 06:39:38)    MCHC: 30.8 Gm/dL Low (09/32/35 57:32:20)    RDW: 13.5 % (07/24/16 06:39:38)    MPV: 9.6 fL  (07/24/16 06:39:38)    NRBC Percent: 0 % (07/24/16 06:39:38)          Diagnostic Results    CT head:    No acute intracranial abnormality.    [1]        CT c-spine:    No acute fracture. Degenerative changes.    [2]        [place] abd:    Echogenic liver consistent with underlying parenchymal disease, most commonly    fatty infiltration.    Splenomegaly.    [3]        CXR:    Low lung volumes without focal infiltrate.        [4]    Impression and Plan    66 y/o with PMH of COPD, hypertension, HFrEF, Bipolar, recurrent falls, and  concern for substance abuse in the past who presents to ED with weakness and  hypotension found to have AKI.        Problems:    1\. Hypotension - ?medication related.    2\. AKI likely pre-renal 2/2 hypotension.    3\. Elevated transaminases/bilirubins.    4\. hypernatremia    5\. Anemia.    6\. Bipolar.    7\. Recurrent falls.    8\. COPD not in exacerbation.            Plan:    - CT head and c-spine negative.    - Has h/o recurrent falls and presentations for hypotension, possible med  related. Hypotension fluid responsive, never required vasopressors.    As outpatient is on lisinopril 40mg  daily, atenolol 50 mg BID, and amlodipine  10mg ; Currently off of all her antihypertensives, may need to re-initiate  slowly. ? if she needs all. monitor BP    Per social work who spoke with elder services and [facility], pt's meds are in lock  box and distributed to pt daily making abuse/misuse unlikely.    PT eval.    was on large doses of tizanidine which are associated with weakness and  dizziness; hold since admission    -  Continue psych meds - cymbalta, latuda, amitriptylline. psych consult  appreciated.    - AKI resolved    - hypernatremia Na 148. encourage oral free water intake, discussed with pt    - elevated LFTs likely related with hypotension. Abd U/S with fatty liver,  otherwise normal. Continue with supportive care.        prophylaxis: heparin sc        discussed with pt at bedside, all questions  addressed      [1] CT Head or Brain C-; Curley Spice MD, Arlys John 07/21/2016 21:16 EST    [2] CT Spine Cervical C-; Curley Spice MD, Arlys John 07/21/2016 21:16 EST    [3] US Abdomen Complete; Edyth Gunnels MD, Lillia Abed 07/22/2016 07:07 EST    [4] XR Chest Single View; Brochu MD, Arlys John 07/21/2016 20:56 EST    SIGNATURE LINE Electronically signed by Loel Dubonnet MD, Charlene Brooke on 07/24/2016 at  15:24:22 EST

## 2016-07-21 NOTE — Consults (Signed)
__________________________________________________________________________    CONSULTATION  DATE:  07/26/2016    HISTORY OF PRESENT ILLNESS:  The patient is a 66 year old woman for whom  Gastroenterology consultation is requested because of abnormal liver chemistries  and gram negative rods with fever.  The patient has had recent falls which she  attributes to hypotension.  These may also be related to substance abuse, as  noted in the Infectious Disease note.  She felt lightheaded, weak on standing  and collapsed at home and was found to be hypotensive when attended by EMTs and  was given IV fluids but no pressors in the Emergency Room because of a blood  pressure of 60/37.  Shortly after admission, she had a temperature to 102.8.   Blood cultures are now reporting gram negative rods in one of four bottles.  It  is noted that liver chemistries are normal.  She denies right upper quadrant  pain.  She is post cholecystectomy decades ago.  She denies chills.  Recent CT  suggested fatty infiltration of the liver, question diffuse central biliary  ductal prominence and extrahepatic biliary ductal dilatation post  cholecystectomy.    PAST MEDICAL HISTORY:  1.  Chronic back pain.    2.  Peripheral neuropathy.    3.  Scoliosis.    4.  Falls.    5.  Bipolar disorder.    6.  Coronary artery disease status post bypass.    7.  Anxiety.    8.  Tobacco abuse.    9.  Hypertension.    10.  Chronic obstructive pulmonary disease.    11.  Alcohol abuse.    12.  Chronic anemia.    ADMISSION MEDICATIONS:  A dozen in number and are listed in the admission  History and Physical.    PHYSICAL EXAMINATION:  VITAL SIGNS:  She is awake, alert and moderately  overweight.  ABDOMEN:  Obese but not tender.  No palpable liver edge or  tenderness on the right upper quadrant or epigastrium.  The remainder of her  abdomen is soft and without a mass.    LABORATORY DATA:  Includes normal white count since admission, hemoglobin low at  7.5 with  hematocrit of 24 and MCV of 112.  Liver chemistries are abnormal with a  bilirubin of 3.6, alkaline phosphatase 448 and AST 223.  Review of earlier  chemistries shows that in 06/2016, her bilirubin was normal with an alkaline  phosphatase of 232 and transaminases were not remarkable.  The first abnormal  chemistries were seen on this admission.    IMPRESSION:    1.  FALLS AT HOME:  Likely due to substance abuse and medications.  2.  ABNORMAL LIVER CHEMISTRIES:  She was hypotensive and therefore it is  possible that these are related to hypotension or shock liver.  There may be  significant alcohol excess contributing to these chemistries.  Biliary  obstruction is a consideration, but not likely without ductal dilatation post  cholecystectomy.  There remains a small chance that she has a retained common  bile duct stone with transient bile duct obstruction or a low-grade obstruction.    SUGGESTIONS:  Would follow her liver chemistries.  If the pattern is suggestive  of choledocholithiasis, consider MRCP.  Would further inquire about her alcohol  intake.    Dictated by:  Almon Register, M.D.    D:  07/26/2016 15:56:34  T:  07/26/2016 16:24:09  E:  07/26/2016 16:24:09  MBR/jf  Job# [id number]   SIGNATURE  LINE    Electronically signed by Su Hilt MD, Charlaine Dalton on 07/27/2016 at 12:43:09 EST

## 2016-07-21 NOTE — Progress Notes (Signed)
Subjective    Patient reports no symptoms whatsoever and she clinically appears to be stable    Review of Systems    Objective    Vitals & Measurements    **T:** 98.6 F (Oral) **TMIN:** 98.0 F (Oral) **TMAX:** 101.2 F (Oral)  **RR:** 20 **BP:** 113/65 **SpO2:** 98% **O2 Rate:** 2 **O2 Method (L/min):**  Room air **WT:** 92.9 Kg    Physical Exam    General exam patient is morbidly obese who is clinically appears to be  comfortable    Respiratory: Lungs are clear to auscultation, Breath sounds are equal.    Cardiovascular: Regular rate and rhythm.    Gastrointestinal: Soft, Non-tender, Non-distended    Neurologic: awake, alert    Psychiatric: Cooperative [1]    Medications    _Inpatient_    albuterol 2.5 mg/3 mL (0.083%) inhalation solution, 2.5 mg= 3 mL, INHAL, q4hr,  PRN    amitriptyline, 50 mg= 1 tab(s), PO, HS    budesonide 0.5 mg/2 mL inhalation suspension, 0.5 mg= 2 mL, NEB, BID    clonazePAM, 1 mg= 1 tab(s), PO, BID    Cymbalta, 60 mg= 2 cap(s), PO, Daily    Dextrose 50% For Protocol, per protocol, IV Push, ud, PRN    formoterol 20 mcg/2 mL inhalation solution, 20 mcg= 2 mL, NEB, BID    glucagon, 1 mg= 1 EA, IM, ud, PRN    glucose 40% oral gel, 37.5 Gm= 1 EA, PO, ud, PRN    heparin, 5000 Unit(s)= 1 mL, sc, q8hr    HumuLIN R Low, 0-12 Unit(s), sc, QIDACHS    Latuda, 120 mg= 6 tab(s), PO, HS    ondansetron, 4 mg= 2 mL, IV Push, q8hr, PRN    oxyCODONE, 5 mg= 1 tab(s), PO, q6hr, PRN    Protonix, 40 mg= 1 tab(s), PO, ACB    Spiriva, 18 mcg= 1 cap(s), INHAL, Daily    Zosyn    _Home_    amitriptyline, 50 mg, PO, HS    amLODIPine, 10 mg, PO, Daily    atenolol, 50 mg, PO, BID    Breo Ellipta 100 mcg-25 mcg/inh inhalation powder, 1 puff(s), INHAL, Daily    clonazePAM 1 mg oral tablet, 1 mg, PO, BID    Cymbalta 60 mg oral delayed release capsule, 60 mg= 1 cap(s), PO, Daily    ferrous gluconate 325 mg (36 mg elemental iron) oral tablet, 325 mg= 1 tab(s),  PO, Daily    furosemide 40 mg oral tablet, See  Instructions    furosemide 40 mg oral tablet, 40 mg= 1 tab(s), PO, Daily    Latuda, 120 mg, PO, HS    lisinopril, 40 mg, PO, Daily    omeprazole, 20 mg, PO, BID    oxyCODONE 10 mg oral tablet, 10 mg= 1 tab(s), PO, BID    Spiriva 18 mcg inhalation capsule, 18 mcg= 1 cap(s), INHAL, Daily    tiZANidine 4 mg oral tablet, 8 mg= 2 tab(s), PO, Daily    tiZANidine 4 mg oral tablet, 4 mg= 1 tab(s), PO, BID    Vitamin B12 250 mcg oral tablet, 250 mcg= 1 tab(s), PO, Daily    Vitamin D3, 1000 Unit(s), PO, Daily    Lab Results    Blood Glucose POC: 99 mg/dL (16/10/96 04:54:09)    Blood Glucose Testing Reason: Routine capillary blood sugar (07/26/16  07:10:19)    Glucose Lvl: 105 mg/dL (81/19/14 78:29:56)    BUN: 10 mg/dL (21/30/86 57:84:69)    Creatinine: 0.969 mg/dL (  07/26/16 10:02:31)    Afn Amer Glomerular Filtration Rate: 71 ml/min/1.3m2 (07/26/16 10:02:33)    Non-Afn Amer Glomerular Filtration Rate: 62 ml/min/1.40m2 (07/26/16 10:02:35)    Sodium Lvl: 142 mmol/L (07/26/16 10:02:31)    Potassium Lvl: 3.6 mmol/L (07/26/16 10:02:31)    Chloride: 113 mmol/L High (07/26/16 10:02:31)    CO2: 18 mmol/L Low (07/26/16 10:02:31)    Anion Gap: 11 (07/26/16 10:02:31)    Total Protein: 5.4 Gm/dL Low (16/10/96 04:54:09)    Albumin Lvl: 1.7 Gm/dL Low (81/19/14 78:29:56)    Calcium Lvl: 7.6 mg/dL Low (21/30/86 57:84:69)    Magnesium Lvl: 1.8 mg/dL (62/95/28 41:32:44)    Bilirubin Total: 3.6 mg/dL High (07/06/70 53:66:44)    Bilirubin Direct: 3 mg/dL High (03/47/42 59:56:38)    Alkaline Phosphatase: 448 Units/L High (07/26/16 10:02:32)    AST: 223 Units/L High (07/26/16 10:02:32)    ALT: 176 Units/L High (07/26/16 10:02:32)    WBC: 6.4 thous/mm3 (07/26/16 10:20:00)    RBC: 2.09 Mil/mm3 Low (07/26/16 10:20:00)    Hgb: 7.5 Gm/dL Low (75/64/33 29:51:88)    Hct: 23.5 % Low (07/26/16 10:20:00)    Platelet: 243 thous/mm3 (07/26/16 10:20:00)    MCV: 112 fL High (07/26/16 10:20:00)    MCH: 36 pGm High (07/26/16 10:20:00)    MCHC: 31.9 Gm/dL Low  (41/66/06 30:16:01)    RDW: 13.9 % (07/26/16 10:20:00)    MPV: 9.5 fL (07/26/16 10:20:00)    NRBC Percent: 0 % (07/26/16 10:20:00)          Diagnostic Results    Impression and Plan    66 y/o with PMH of COPD, hypertension, HFrEF, Bipolar, recurrent falls, and  concern for substance abuse in the past who presents to ED with weakness and  hypotension found to have AKI.        Problems:    1\. Hypotension -Shock which resulted in acute renal failure as well as shock  liver resolved possibly related to medication induced versus hypoperfusion of  hypertension        2\. AKI likely pre-renal 2/2 hypotension.Resolved her creatinine is back to  normal        3\. Cholestatic jaundice versus obstructive jaundice, abdominal ultrasound did  not show any evidence of common bile duct obstruction    With a gram-negative bacteremia, it is reasonable to do a CT scan of the  abdomen, And will consult infectious disease    Appreciate GI input. And if there is no evidence of common bile duct  obstruction that possibly a GI source him to be the likely explanation for the  patient's gram-negative bacteremia    And her abnormal liver function tests could well be due to sepsis however,  bile duct obstruction seemed to be my concern.        4\. hypernatremia: To hypovolemia resolved        5\. Anemia.Due to sepsis as well as anemia of chronic disease        6\. Bipolar.: Possibly contributing to the patient's clinical condition will  continue to monitor that        7\. Recurrent falls.: In the setting of hypotension and hypoperfusion  anticipate resolve with correction of the above        8\. COPD not in exacerbation.                    prophylaxis: heparin sc [2]    [1] Progress/SOAP Note; Loel Dubonnet MD, Charlene Brooke 07/24/2016 11:22 EST    [  2] Progress/SOAP Note; Loel Dubonnet MD, Charlene Brooke 07/24/2016 11:22 EST    SIGNATURE LINE Electronically signed by Irving Burton MD-HOSP, Cheyrl Buley on 07/26/2016  at 13:36:40 EST

## 2016-07-21 NOTE — Progress Notes (Signed)
Subjective    pt seen and examined. nad noted. vss tmax 99.6    Review of Systems    All systems have been reviewed and are negative other than what was stated in  the HPI.    Objective    Vitals & Measurements    **T:** 97.9 F (Temporal Artery) **TMIN:** 97.2 F (Oral) **TMAX:** 99.6 F  (Oral) **HR:** 84 **RR:** 22 **BP:** 162/88 **SpO2:** 93% **O2 Rate:** 2 **O2  Method (L/min):** Nasal cannula **WT:** 97.3 Kg    Physical Exam    GEN: alert, oriented x 3, NAD, obese, jaundiced in appearance    SKIN: no diaphoresis, clean, dry and intact.    HEENT: NC, atraumatic, PERRLA, MM moist, no pharyngeal edema, tongue midline.    NECK: supple, no JVD    CV: RRR, no murmurs,rubs or gallops    CHEST: ls cta, non labored resp, equal air movement    ABD: soft, nt, protuberant, +bsx4    GU: no cva tenderness.    EXT: Warm, well perfused, no clubbing, no cyanosis, no edema    NEURO: alert, oriented x 3, nonfocal exam    PSYCH: normal mood.    Medications    _Inpatient_    albuterol 2.5 mg/3 mL (0.083%) inhalation solution, 2.5 mg= 3 mL, INHAL, q4hr,  PRN    amitriptyline, 50 mg= 1 tab(s), PO, HS    budesonide 0.5 mg/2 mL inhalation suspension, 0.5 mg= 2 mL, NEB, BID    clonazePAM, 1 mg= 1 tab(s), PO, BID    Cymbalta, 60 mg= 2 cap(s), PO, Daily    Dextrose 50% For Protocol, per protocol, IV Push, ud, PRN    formoterol 20 mcg/2 mL inhalation solution, 20 mcg= 2 mL, NEB, BID    glucagon, 1 mg= 1 EA, IM, ud, PRN    glucose 40% oral gel, 37.5 Gm= 1 EA, PO, ud, PRN    HumuLIN R Low, 0-12 Unit(s), sc, QIDACHS    indomethacin, 100 mg= 2 supp, PR, Once    Latuda, 120 mg= 6 tab(s), PO, HS    ondansetron, 4 mg= 2 mL, IV Push, q8hr, PRN    oxyCODONE, 5 mg= 1 tab(s), PO, q6hr, PRN    Protonix, 40 mg= 1 tab(s), PO, ACB    Sodium Chloride 0.9% 250 mL, 250 mL, IV    Spiriva, 18 mcg= 1 cap(s), INHAL, Daily    Zosyn    _Home_    amitriptyline, 50 mg, PO, HS    amLODIPine, 10 mg, PO, Daily    atenolol, 50 mg, PO, BID    Breo Ellipta 100  mcg-25 mcg/inh inhalation powder, 1 puff(s), INHAL, Daily    clonazePAM 1 mg oral tablet, 1 mg, PO, BID    Cymbalta 60 mg oral delayed release capsule, 60 mg= 1 cap(s), PO, Daily    ferrous gluconate 325 mg (36 mg elemental iron) oral tablet, 325 mg= 1 tab(s),  PO, Daily    furosemide 40 mg oral tablet, See Instructions    furosemide 40 mg oral tablet, 40 mg= 1 tab(s), PO, Daily    Latuda, 120 mg, PO, HS    lisinopril, 40 mg, PO, Daily    omeprazole, 20 mg, PO, BID    oxyCODONE 10 mg oral tablet, 10 mg= 1 tab(s), PO, BID    Spiriva 18 mcg inhalation capsule, 18 mcg= 1 cap(s), INHAL, Daily    tiZANidine 4 mg oral tablet, 8 mg= 2 tab(s), PO, Daily    tiZANidine 4 mg oral  tablet, 4 mg= 1 tab(s), PO, BID    Vitamin B12 250 mcg oral tablet, 250 mcg= 1 tab(s), PO, Daily    Vitamin D3, 1000 Unit(s), PO, Daily    Lab Results    Blood Glucose POC: 106 mg/dL (65/78/46 96:29:52)    Blood Glucose Testing Reason: Routine capillary blood sugar (07/29/16  11:30:31)    Glucose Lvl: 76 mg/dL (84/13/24 40:10:27)    BUN: 3 mg/dL Low (25/36/64 40:34:74)    Creatinine: 0.651 mg/dL (25/95/63 87:56:43)    Afn Amer Glomerular Filtration Rate: >90 (07/29/16 08:42:16)    Non-Afn Amer Glomerular Filtration Rate: >90 (07/29/16 08:42:18)    Sodium Lvl: 144 mmol/L (07/29/16 08:42:14)    Potassium Lvl: 3.9 mmol/L (07/29/16 08:42:14)    Chloride: 115 mmol/L High (07/29/16 08:42:14)    CO2: 21 mmol/L (07/29/16 08:42:14)    Anion Gap: 8 (07/29/16 08:42:14)    Total Protein: 5.5 Gm/dL Low (32/95/18 84:16:60)    Albumin Lvl: 1.6 Gm/dL Low (63/01/60 10:93:23)    Calcium Lvl: 7.8 mg/dL Low (55/73/22 02:54:27)    Magnesium Lvl: 2 mg/dL (12/26/74 28:31:51)    Bilirubin Total: 2.5 mg/dL High (76/16/07 37:10:62)    Bilirubin Direct: 1.9 mg/dL High (69/48/54 62:70:35)    Alkaline Phosphatase: 392 Units/L High (07/29/16 08:42:15)    AST: 76 Units/L High (07/29/16 08:42:15)    ALT: 93 Units/L High (07/29/16 08:42:15)    WBC: 5.4 thous/mm3 (07/29/16 08:30:42)     RBC: 2.8 Mil/mm3 Low (07/29/16 08:30:42)    Hgb: 9.2 Gm/dL Low (00/93/81 82:99:37)    Hct: 28.1 % Low (07/29/16 08:30:42)    Platelet: 254 thous/mm3 (07/29/16 08:30:42)    MCV: 100 fL High (07/29/16 08:30:42)    MCH: 33 pGm High (07/29/16 08:30:42)    MCHC: 32.7 Gm/dL (16/96/78 93:81:01)    RDW: 19.3 % High (07/29/16 08:30:42)    MPV: 9.5 fL (07/29/16 08:30:42)    NRBC Percent: 0 % (07/29/16 08:30:42)    TRANSFUSED: TRANSFUSED (07/29/16 04:00:40)          Diagnostic Results    Impression and Plan    Chronic back pain    66 y/o with PMH of COPD, hypertension, HFrEF, Bipolar, recurrent falls, and  concern for substance abuse in the past who presents to ED    with weakness and hypotension found to have AKI.        Problems:    1\. Hypotension -Shock which resulted in acute renal failure as well as shock  liver resolved possibly related to medication induced versus hypoperfusion of  hypertension    ID following: 1\. Sepsis due to cholangitis / choledocholithiasis 34m, 2. Gram  negative rod sepsis due to klebsiella, biliary source    continue zosyn for now to better cover anaerobes and mixed flora that would be  expected in cholangitis though the blood shows a more sensitive klebsiella (so  alternatively could narrow to IV ceftriaxone+flagyl PO) -would treat for 14  days since source control (expected as of 1/25)    -MRCP: showing IMPRESSION:    1\. Moderate-severe intra- and extra-hepatic biliary ductal dilatation, with  13    mm filling defect in the distal CBD consistent with choledocholithiasis and  Significant hepatic steatosis.    **topday had:** U/S and fluoro guided right sided internal / external biliary  drain.    s/p succesful stone removal in IR,    2\. AKI likely pre-renal 2/2 hypotension. ResolvedPt should leave drain to  gravity drainage x 24 hrs. Internal drainage  tomorrow.    Return to IR next week for follow up cholangiogram.    cont to trend LFTs        3\. Cholestatic jaundice versus obstructive  jaundice,    abdominal ultrasound did not show any evidence of common bile duct obstruction    With a gram-negative bacteremia, it is reasonable to do a CT scan of the  abdomen, And will consult infectious disease    Appreciate GI input:sec to persistent elevated LFTS and elevated bili, rec  MRCP showing per above    npo until after ERCP        4\. hypernatremia: To hypovolemia -resolved    follow sodium levels        5\. Anemia.Due to sepsis as well as anemia of chronic disease    s/p 2 units of prbc's    await GI final eval:unable to complete ercp secondary to hx of gastric bypase        6\. Bipolar.: Possibly contributing to the patient's clinical condition will  continue to monitor that        7\. Recurrent falls.: In the setting of hypotension and hypoperfusion  anticipate resolve with correction of the above    PT eval        8\. COPD not in exacerbation.        prophylaxis: scd,ppi  [1]                    [2]    Fall    Fall    Hypokalemia    Hypotension    Hypotension    Weakness    Weakness    [1] Progress/SOAP Note; Yanely Mast NP, Gredmarie Delange 07/28/2016 16:59 EST    [2] Brief IR Procedure Note; Lynnell Chad MD, Sharlot Gowda 07/29/2016 16:44 EST    SIGNATURE LINE Electronically signed by Brynda Peon MD, Seema on 08/21/2016 at  12:48:48 EST

## 2016-07-21 NOTE — Progress Notes (Signed)
Subjective    Patient was seen and assessed in the am. Patient reports feeling okay and  wants to be discharged tomorrow.        IV ABX has been changed to PO.    Review of Systems    Objective    Vitals & Measurements    **T:** 97.7 F (Oral) **TMIN:** 97.0 F (Oral) **TMAX:** 97.9 F (Temporal  Artery) **HR:** 84 **RR:** 20 **BP:** 164/95 **SpO2:** 98% **O2 Rate:** 2 **O2  Method (L/min):** Room air **WT:** 94.5 Kg    Physical Exam    General: NAD    Respiratory: Lungs clear to auscultation, respirations non-labored    Cardiovascular: Normal Rate, normal rhythm    Gastrointestinal: Abdomen soft, non tender    Musculoskeletal: normal ROM, normal strength    Integumentary: drain in place    Neurologic: Alert, Oriented    Psychiatric: Cooperative, appropriate mood & affect      Medications    _Inpatient_    albuterol 2.5 mg/3 mL (0.083%) inhalation solution, 2.5 mg= 3 mL, INHAL, q4hr,  PRN    amitriptyline, 50 mg= 1 tab(s), PO, HS    budesonide 0.5 mg/2 mL inhalation suspension, 0.5 mg= 2 mL, NEB, BID    Cipro, 500 mg= 2 tab(s), PO, BIDAC    clonazePAM, 1 mg= 1 tab(s), PO, BID    Cymbalta, 60 mg= 2 cap(s), PO, Daily    Dextrose 50% For Protocol, per protocol, IV Push, ud, PRN    Flagyl, 500 mg= 2 tab(s), PO, q8hr    formoterol 20 mcg/2 mL inhalation solution, 20 mcg= 2 mL, NEB, BID    glucagon, 1 mg= 1 EA, IM, ud, PRN    glucose 40% oral gel, 37.5 Gm= 1 EA, PO, ud, PRN    HumuLIN R Low, 0-12 Unit(s), sc, QIDACHS    indomethacin, 100 mg= 2 supp, PR, Once    Latuda, 120 mg= 6 tab(s), PO, HS    ondansetron, 4 mg= 2 mL, IV Push, q8hr, PRN    oxyCODONE, 10 mg= 2 tab(s), PO, TID, PRN    Protonix, 40 mg= 1 tab(s), PO, ACB    Spiriva, 18 mcg= 1 cap(s), INHAL, Daily    _Home_    amitriptyline, 50 mg, PO, HS    amLODIPine, 10 mg, PO, Daily    atenolol, 50 mg, PO, BID    Breo Ellipta 100 mcg-25 mcg/inh inhalation powder, 1 puff(s), INHAL, Daily    clonazePAM 1 mg oral tablet, 1 mg, PO, BID    Cymbalta 60 mg oral delayed  release capsule, 60 mg= 1 cap(s), PO, Daily    ferrous gluconate 325 mg (36 mg elemental iron) oral tablet, 325 mg= 1 tab(s),  PO, Daily    furosemide 40 mg oral tablet, See Instructions    furosemide 40 mg oral tablet, 40 mg= 1 tab(s), PO, Daily    Latuda, 120 mg, PO, HS    lisinopril, 40 mg, PO, Daily    omeprazole, 20 mg, PO, BID    oxyCODONE 10 mg oral tablet, 10 mg= 1 tab(s), PO, BID    Spiriva 18 mcg inhalation capsule, 18 mcg= 1 cap(s), INHAL, Daily    tiZANidine 4 mg oral tablet, 8 mg= 2 tab(s), PO, Daily    tiZANidine 4 mg oral tablet, 4 mg= 1 tab(s), PO, BID    Vitamin B12 250 mcg oral tablet, 250 mcg= 1 tab(s), PO, Daily    Vitamin D3, 1000 Unit(s), PO, Daily    Lab Results  Blood Glucose POC: 100 mg/dL (40/98/11 91:47:82)    Blood Glucose Testing Reason: Routine capillary blood sugar (07/30/16  11:15:00)    Glucose Lvl: 73 mg/dL (95/62/13 08:65:78)    BUN: 4 mg/dL Low (46/96/29 52:84:13)    Creatinine: 0.612 mg/dL (24/40/10 27:25:36)    Afn Amer Glomerular Filtration Rate: >90 (07/30/16 08:53:16)    Non-Afn Amer Glomerular Filtration Rate: >90 (07/30/16 08:53:18)    Sodium Lvl: 143 mmol/L (07/30/16 08:54:09)    Potassium Lvl: 3.6 mmol/L (07/30/16 08:54:09)    Chloride: 113 mmol/L High (07/30/16 08:54:09)    CO2: 22 mmol/L (07/30/16 08:54:09)    Anion Gap: 8 (07/30/16 08:54:09)    Total Protein: 5.5 Gm/dL Low (64/40/34 74:25:95)    Albumin Lvl: 1.7 Gm/dL Low (63/87/56 43:32:95)    Calcium Lvl: 7.9 mg/dL Low (18/84/16 60:63:01)    Magnesium Lvl: 2 mg/dL (60/10/93 23:55:73)    Bilirubin Total: 2.4 mg/dL High (22/02/54 27:06:23)    Bilirubin Direct: 1.7 mg/dL High (76/28/31 51:76:16)    Alkaline Phosphatase: 360 Units/L High (07/30/16 08:54:10)    AST: 46 Units/L High (07/30/16 08:54:10)    ALT: 73 Units/L (07/30/16 08:54:10)    WBC: 6.6 thous/mm3 (07/30/16 08:31:21)    RBC: 2.67 Mil/mm3 Low (07/30/16 08:31:21)    Hgb: 8.7 Gm/dL Low (07/37/10 62:69:48)    Hct: 27.6 % Low (07/30/16 08:31:21)    Platelet:  249 thous/mm3 (07/30/16 08:31:21)    MCV: 103 fL High (07/30/16 08:31:21)    MCH: 33 pGm High (07/30/16 08:31:21)    MCHC: 31.5 Gm/dL Low (54/62/70 35:00:93)    RDW: 18.8 % High (07/30/16 08:31:21)    MPV: 9.4 fL (07/30/16 08:31:21)    NRBC Percent: 0 % (07/30/16 08:31:21)          Diagnostic Results    Impression and Plan    Chronic back pain    Fall    Hypokalemia    Hypotension    Weakness    66 y/o with PMH of COPD, hypertension, HFrEF, Bipolar, recurrent falls, and  concern for substance abuse in the past who presents to ED    with weakness and hypotension found to have AKI.            #Hypotension    -secondary to shock which resulted in acute renal failure as well as shock liver resolved possibly related to medication induced versus hypoperfusion of hypertension        #Sepsis due to cholangitis / choledocholithiasis 6m    -with bacteremia due to klebsiella, billary course    -appreciate ID consult, patient will be on PO ABX x 14 days cipro and flagyk     -patient had MRCP with successful stone removal with IR    -drain in place, internal drainage scheduled for today    -patient will need follow up cholangiogram next week            #Acute electrolyte derangment    -ctm        #Anemia    -acute on chronic    -patient required 2 units of prbc's        #Bipolar    -continue with home meds        #Recurrent falls    -will work with PT for discharging planning    -she is deferring rehab            #COPD    -stable    -CTM     SIGNATURE LINE Electronically signed by [employer]  MD, Seema on 08/21/2016 at  12:48:48 EST

## 2016-07-21 NOTE — Progress Notes (Signed)
Subjective    Reviewed chart,    Discussed during medicine rounds,    remains afebrile    tolerating drain    Review of Systems    Objective    Vitals & Measurements    **T:** 98 F (Oral) **TMIN:** 97.0 F (Oral) **TMAX:** 98 F (Oral) **RR:** 18  **BP:** 146/89 **SpO2:** 96% **O2 Method (L/min):** Room air **WT:** 85.8 Kg    Physical Exam    hemodynamically stable    no acute distress    s1s2    chest clear    soft abdomen no guarding    drain in place    Medications    _Inpatient_    albuterol 2.5 mg/3 mL (0.083%) inhalation solution, 2.5 mg= 3 mL, INHAL, q4hr,  PRN    amitriptyline, 50 mg= 1 tab(s), PO, HS    budesonide 0.5 mg/2 mL inhalation suspension, 0.5 mg= 2 mL, NEB, BID    clonazePAM, 1 mg= 1 tab(s), PO, BID    Cymbalta, 60 mg= 2 cap(s), PO, Daily    Dextrose 50% For Protocol, per protocol, IV Push, ud, PRN    formoterol 20 mcg/2 mL inhalation solution, 20 mcg= 2 mL, NEB, BID    glucagon, 1 mg= 1 EA, IM, ud, PRN    glucose 40% oral gel, 37.5 Gm= 1 EA, PO, ud, PRN    HumuLIN R Low, 0-12 Unit(s), sc, QIDACHS    lactobacillus rhamnosus GG, 1 cap(s), PO, Daily    Latuda, 120 mg= 6 tab(s), PO, HS    Metoprolol Tartrate (Lopressor), 50 mg= 1 tab(s), PO, BID    MiraLax oral powder for reconstitution, 17 Gm= 1 EA, PO, Daily, PRN    ondansetron, 4 mg= 2 mL, IV Push, q8hr, PRN    oxyCODONE, 10 mg= 2 tab(s), PO, TID, PRN    Protonix, 40 mg= 1 tab(s), PO, ACB    Reglan, 10 mg= 2 mL, IV Push, q6hr, PRN    Spiriva, 18 mcg= 1 cap(s), INHAL, Daily    Zosyn    _Home_    amitriptyline, 50 mg, PO, HS    amLODIPine, 10 mg, PO, Daily    atenolol, 50 mg, PO, BID    Breo Ellipta 100 mcg-25 mcg/inh inhalation powder, 1 puff(s), INHAL, Daily    clonazePAM 1 mg oral tablet, 1 mg, PO, BID    Cymbalta 60 mg oral delayed release capsule, 60 mg= 1 cap(s), PO, Daily    ferrous gluconate 325 mg (36 mg elemental iron) oral tablet, 325 mg= 1 tab(s),  PO, Daily    furosemide 40 mg oral tablet, See Instructions    furosemide 40 mg  oral tablet, 40 mg= 1 tab(s), PO, Daily    Latuda, 120 mg, PO, HS    lisinopril, 40 mg, PO, Daily    omeprazole, 20 mg, PO, BID    oxyCODONE 10 mg oral tablet, 10 mg= 1 tab(s), PO, BID    Spiriva 18 mcg inhalation capsule, 18 mcg= 1 cap(s), INHAL, Daily    tiZANidine 4 mg oral tablet, 8 mg= 2 tab(s), PO, Daily    tiZANidine 4 mg oral tablet, 4 mg= 1 tab(s), PO, BID    Vitamin B12 250 mcg oral tablet, 250 mcg= 1 tab(s), PO, Daily    Vitamin D3, 1000 Unit(s), PO, Daily    Lab Results    Blood Glucose POC: 113 mg/dL High (16/10/96 04:54:09)    Blood Glucose Testing Reason: Routine capillary blood sugar (08/09/16  20:56:02)    Glucose  Lvl: 81 mg/dL (16/10/96 04:54:09)    BUN: 6 mg/dL Low (81/19/14 78:29:56)    Creatinine: 0.824 mg/dL (21/30/86 57:84:69)    Afn Amer Glomerular Filtration Rate: 87 ml/min/1.4m2 (08/09/16 11:07:23)    Non-Afn Amer Glomerular Filtration Rate: 75 ml/min/1.66m2 (08/09/16 11:07:25)    Sodium Lvl: 141 mmol/L (08/09/16 11:07:21)    Potassium Lvl: 4.1 mmol/L (08/09/16 11:07:21)    Chloride: 112 mmol/L High (08/09/16 11:07:21)    CO2: 22 mmol/L (08/09/16 11:07:21)    Anion Gap: 7 (08/09/16 11:07:21)    Total Protein: 5.6 Gm/dL Low (62/95/28 41:32:44)    Albumin Lvl: 2.1 Gm/dL Low (07/06/70 53:66:44)    Calcium Lvl: 8 mg/dL Low (03/47/42 59:56:38)    Magnesium Lvl: 2.2 mg/dL (75/64/33 29:51:88)    Bilirubin Total: 1 mg/dL (41/66/06 30:16:01)    Bilirubin Direct: 0.8 mg/dL High (09/32/35 57:32:20)    Alkaline Phosphatase: 193 Units/L High (08/09/16 11:07:22)    AST: 23 Units/L (08/09/16 11:07:22)    ALT: 17 Units/L (08/09/16 11:07:22)    WBC: 8.5 thous/mm3 (08/09/16 10:37:50)    RBC: 2.84 Mil/mm3 Low (08/09/16 10:37:50)    Hgb: 9.5 Gm/dL Low (25/42/70 62:37:62)    Hct: 29.4 % Low (08/09/16 10:37:50)    Platelet: 450 thous/mm3 High (08/09/16 10:37:50)    MCV: 104 fL High (08/09/16 10:37:50)    MCH: 34 pGm High (08/09/16 10:37:50)    MCHC: 32.3 Gm/dL (83/15/17 61:60:73)    RDW: 17 % High (08/09/16  10:37:50)    MPV: 9 fL (08/09/16 10:37:50)    NRBC Percent: 0 % (08/09/16 10:37:50)          Diagnostic Results            Impression and Plan:        PAST MEDICAL HISTORY:    1\. Chronic back pain.    2\. Peripheral neuropathy.    3\. Scoliosis.    4\. Falls.    5\. Bipolar disorder.    6\. Coronary artery disease, status post bypass.    7\. Anxiety.    8\. Tobacco abuse disorder.    9\. Hypertension.    10\. COPD.    11\. Alcohol abuse disorder.    12\. chronic anemia.            Admitted on 1/17 to ICU transfer to hospital medicine 1/19 with history of  fall and weakness.    Fall work up unrevealing for fracture or intracranial abnormality    During hospitalization patient was treated for Sepsis due to Parker  bacteremia for completed Zosyn now abx day#12/14,    transition to Levofloxacin at discharge. Repeat Blood culture negative,  hemodynamically stable            Transaminitis initially thought as Shock liver due to hypotension, imaging US  abdomen unrevealing underwent CT abdomen pelvis    and MRI, found to have choledocholithiasis under Ultrasound fluoro guided  stone retrieval now s/p internal external drain    which had been putting out externally. Now cap trial today 2/5. IR recommends,  close external drain and allow internal drain    monitor for signs and symptoms of infection.        Acute renal failure resolved        Acute transaminase elevation.        Electrolyte abnormality replaced        Activity encouraged            Disposition: monitor cap trial anticipate discharge next 24-48hrs  patient is ambulating safely per staff therefore disposition home with [facility]      SIGNATURE LINE Electronically signed by San Jetty, Kacia Halley on 08/15/2016  at 16:55:23 EST

## 2016-07-21 NOTE — Discharge Summary (Signed)
Date of Admission    07/21/16    Date of Discharge    08/11/16    Code Status    Full code.      Hospital Course    This is an addendum to prior discharge note dictated by Dr. Maren Reamer.        Pt tolerates capping of the RUQ drainage well without issues.    She was evaluated by IR again today.    if develops new abd pain, n/v, f/c, to call MD office right away and return  catheter to gravity drainage to bag. Pt states understanding. Otherwise would  have pt return in 2-4 weeks for repeat cholangiogram and possible tube  removal.    She remais afebrile and HD stable, tolerates full diet.        She is stable for discharge to home with [facility] for catheter care. She should  continue on the levaquin today and tomorrow and then stop. C/w probiotic for  the next 5 day and may also stop.    Her antihypertensives have been on hold during this admission due to the  bacteremia/sepsis. Now that she is recovering and she is stable, may restart  with PCP f/up.    Physical Exam    Vitals & Measurements    **T:** 97.9 F (Oral) **TMIN:** 97.3 F (Oral) **TMAX:** 97.9 F (Oral)  **RR:** 18 **BP:** 119/67 **SpO2:** 99% **O2 Method (L/min):** Room air    General: Alert and oriented, No acute distress.    Respiratory: Lungs are clear to auscultation, Respirations are non-labored.    Cardiovascular: Normal rate, Regular rhythm.    Gastrointestinal: Soft, Non-tender, Non-distended, Normal bowel sounds.    Integumentary: Warm, Dry.    Neurologic: Alert, Oriented.    Psychiatric: Cooperative, Appropriate mood & affect.    Lab Results    Blood Glucose POC: 76 mg/dL (25/42/70 62:37:62)    Blood Glucose Testing Reason: Routine capillary blood sugar (08/11/16  07:57:32)    Glucose Lvl: 72 mg/dL (83/15/17 61:60:73)    BUN: 8 mg/dL (71/06/26 94:85:46)    Creatinine: 0.775 mg/dL (27/03/50 09:38:18)    Afn Amer Glomerular Filtration Rate: >90 (08/11/16 07:49:16)    Non-Afn Amer Glomerular Filtration Rate: 81 ml/min/1.65m2 (08/11/16 07:49:14)     Sodium Lvl: 144 mmol/L (08/11/16 07:49:12)    Potassium Lvl: 3.9 mmol/L (08/11/16 07:49:12)    Chloride: 113 mmol/L High (08/11/16 07:49:12)    CO2: 23 mmol/L (08/11/16 07:49:12)    Anion Gap: 8 (08/11/16 07:49:12)    Total Protein: 5.4 Gm/dL Low (29/93/71 69:67:89)    Albumin Lvl: 1.8 Gm/dL Low (38/10/17 51:02:58)    Calcium Lvl: 7.7 mg/dL Low (52/77/82 42:35:36)    Magnesium Lvl: 2.2 mg/dL (14/43/15 40:08:67)    Bilirubin Total: 0.9 mg/dL (61/95/09 32:67:12)    Bilirubin Direct: 0.5 mg/dL High (45/80/99 83:38:25)    Alkaline Phosphatase: 156 Units/L High (08/11/16 07:49:13)    AST: 23 Units/L (08/11/16 07:49:13)    ALT: 16 Units/L (08/11/16 07:49:13)    WBC: 7.4 thous/mm3 (08/11/16 07:34:36)    RBC: 2.54 Mil/mm3 Low (08/11/16 07:34:36)    Hgb: 8.6 Gm/dL Low (05/39/76 73:41:93)    Hct: 26.8 % Low (08/11/16 07:34:36)    Platelet: 374 thous/mm3 (08/11/16 07:34:36)    MCV: 106 fL High (08/11/16 07:34:36)    MCH: 34 pGm High (08/11/16 07:34:36)    MCHC: 32.1 Gm/dL (79/02/40 97:35:32)    RDW: 17 % High (08/11/16 07:34:36)    MPV: 9.2 fL (08/11/16 07:34:36)  NRBC Percent: 0 % (08/11/16 07:34:36)          Discharge Medications    _Discharge_    amitriptyline, 50 mg, PO, HS    amLODIPine, 10 mg, PO, Daily    atenolol, 50 mg, PO, BID    Breo Ellipta 100 mcg-25 mcg/inh inhalation powder, 1 puff(s), INHAL, Daily    clonazePAM 1 mg oral tablet, 1 mg, PO, BID    Cymbalta 60 mg oral delayed release capsule, 60 mg= 1 cap(s), PO, Daily    ferrous gluconate 325 mg (36 mg elemental iron) oral tablet, 325 mg= 1 tab(s),  PO, Daily    furosemide 40 mg oral tablet, See Instructions    furosemide 40 mg oral tablet, 40 mg= 1 tab(s), PO, Daily    lactobacillus rhamnosus GG oral capsule, 1 cap(s), PO, Daily    Latuda, 120 mg, PO, HS    Levaquin 750 mg oral tablet, 750 mg= 1 tab(s), PO, q24hr    lisinopril, 40 mg, PO, Daily    omeprazole, 20 mg, PO, BID    oxyCODONE 10 mg oral tablet, 10 mg= 1 tab(s), PO, BID    Spiriva 18 mcg inhalation  capsule, 18 mcg= 1 cap(s), INHAL, Daily    tiZANidine 4 mg oral tablet, 8 mg= 2 tab(s), PO, Daily    tiZANidine 4 mg oral tablet, 4 mg= 1 tab(s), PO, BID    Vitamin B12 250 mcg oral tablet, 250 mcg= 1 tab(s), PO, Daily    Vitamin D3, 1000 Unit(s), PO, Daily    Discharge Instructions    F/up with PCP in 1 week.    F/up with IR as above, sooner if she develops symptoms. Pt knows well when to  call.    Face to Face    >25min direct care upon discharge.    SIGNATURE LINE Electronically signed by Quanisha Drewry MD (HOSP), Adler Chartrand on  08/11/2016 at 17:14:01 EST

## 2016-07-21 NOTE — Progress Notes (Signed)
Subjective    "I have to get up and move around, I can't just sit here" Pain 6-7/10 in R  flank area near cholangio tube. Drain output moderate to large with 300 over  night and since 7am. IR contacted for schedule of internalizing tube and  will occur most likely middle of next week when output slows down.    Patient examined, chart reviewed, and discussed plan of care with healthcare  team.    Review of Systems    All systems have been reviewed and are negative unless identified in the HPI.    Objective    Vitals & Measurements    **T:** 99 F (Oral) **TMIN:** 97.6 F (Oral) **TMAX:** 99.3 F (Oral) **RR:**  20 **BP:** 126/77 **SpO2:** 96% **O2 Method (L/min):** Room air **WT:** 93 Kg    Physical Exam    General: NAD, asking appropriate questions, states she gets nervous when she  can't breath.    Neuro: A & O X 3. No focal deficits. Calm, following commands    Pulmonary: CTAB, no cough or wheeze. Non-labored respirations on Room Air.    Cardiac: Reg rate. No chest pain or shortness of breath. Bil LE edema    GI/GU: +BS, abd soft, Tender at tube insertion site. Non-distended. BM x 1  U/O: Voiding in BR    Skin: warm and dry. No rash    Musculoskeletal: MAE, Independent ambulation    Medications    _Inpatient_    albuterol 2.5 mg/3 mL (0.083%) inhalation solution, 2.5 mg= 3 mL, INHAL, q4hr,  PRN    amitriptyline, 50 mg= 1 tab(s), PO, HS    budesonide 0.5 mg/2 mL inhalation suspension, 0.5 mg= 2 mL, NEB, BID    Cipro, 500 mg= 2 tab(s), PO, BIDAC    clonazePAM, 1 mg= 1 tab(s), PO, BID    Cymbalta, 60 mg= 2 cap(s), PO, Daily    Dextrose 50% For Protocol, per protocol, IV Push, ud, PRN    Flagyl, 500 mg= 2 tab(s), PO, q8hr    formoterol 20 mcg/2 mL inhalation solution, 20 mcg= 2 mL, NEB, BID    glucagon, 1 mg= 1 EA, IM, ud, PRN    glucose 40% oral gel, 37.5 Gm= 1 EA, PO, ud, PRN    HumuLIN R Low, 0-12 Unit(s), sc, QIDACHS    indomethacin, 100 mg= 2 supp, PR, Once    Latuda, 120 mg= 6 tab(s), PO,  HS    Metoprolol Tartrate (Lopressor), 25 mg= 1 tab(s), PO, BID    ondansetron, 4 mg= 2 mL, IV Push, q8hr, PRN    oxyCODONE, 10 mg= 2 tab(s), PO, TID, PRN    Protonix, 40 mg= 1 tab(s), PO, ACB    Reglan, 10 mg= 2 mL, IV Push, q6hr, PRN    Spiriva, 18 mcg= 1 cap(s), INHAL, Daily    _Home_    amitriptyline, 50 mg, PO, HS    amLODIPine, 10 mg, PO, Daily    atenolol, 50 mg, PO, BID    Breo Ellipta 100 mcg-25 mcg/inh inhalation powder, 1 puff(s), INHAL, Daily    clonazePAM 1 mg oral tablet, 1 mg, PO, BID    Cymbalta 60 mg oral delayed release capsule, 60 mg= 1 cap(s), PO, Daily    ferrous gluconate 325 mg (36 mg elemental iron) oral tablet, 325 mg= 1 tab(s),  PO, Daily    furosemide 40 mg oral tablet, See Instructions    furosemide 40 mg oral tablet, 40 mg= 1 tab(s), PO, Daily  Latuda, 120 mg, PO, HS    lisinopril, 40 mg, PO, Daily    omeprazole, 20 mg, PO, BID    oxyCODONE 10 mg oral tablet, 10 mg= 1 tab(s), PO, BID    Spiriva 18 mcg inhalation capsule, 18 mcg= 1 cap(s), INHAL, Daily    tiZANidine 4 mg oral tablet, 8 mg= 2 tab(s), PO, Daily    tiZANidine 4 mg oral tablet, 4 mg= 1 tab(s), PO, BID    Vitamin B12 250 mcg oral tablet, 250 mcg= 1 tab(s), PO, Daily    Vitamin D3, 1000 Unit(s), PO, Daily    Lab Results    Blood Glucose POC: 142 mg/dL High (57/84/69 62:95:28)    Blood Glucose Testing Reason: Routine capillary blood sugar (07/31/16  20:51:04)    Glucose Lvl: 91 mg/dL (41/32/44 01:02:72)    BUN: 3 mg/dL Low (53/66/44 03:47:42)    Creatinine: 0.567 mg/dL (59/56/38 75:64:33)    Afn Amer Glomerular Filtration Rate: >90 (07/31/16 08:06:14)    Non-Afn Amer Glomerular Filtration Rate: >90 (07/31/16 08:06:15)    Sodium Lvl: 144 mmol/L (07/31/16 08:06:11)    Potassium Lvl: 3.4 mmol/L Low (07/31/16 08:06:11)    Chloride: 112 mmol/L High (07/31/16 08:06:11)    CO2: 23 mmol/L (07/31/16 08:06:11)    Anion Gap: 9 (07/31/16 08:06:11)    Total Protein: 5 Gm/dL Low (29/51/88 41:66:06)    Albumin Lvl: 1.7 Gm/dL Low (30/16/01  09:32:35)    Calcium Lvl: 7.7 mg/dL Low (57/32/20 25:42:70)    Magnesium Lvl: 1.8 mg/dL (62/37/62 83:15:17)    Bilirubin Total: 2.1 mg/dL High (61/60/73 71:06:26)    Bilirubin Direct: 1.6 mg/dL High (94/85/46 27:03:50)    Alkaline Phosphatase: 319 Units/L High (07/31/16 08:06:12)    AST: 31 Units/L (07/31/16 08:06:12)    ALT: 54 Units/L (07/31/16 08:06:12)    WBC: 6.7 thous/mm3 (07/31/16 07:33:30)    RBC: 2.52 Mil/mm3 Low (07/31/16 07:33:30)    Hgb: 8.5 Gm/dL Low (09/38/18 29:93:71)    Hct: 26 % Low (07/31/16 07:33:30)    Platelet: 243 thous/mm3 (07/31/16 07:33:30)    MCV: 103 fL High (07/31/16 07:33:30)    MCH: 34 pGm High (07/31/16 07:33:30)    MCHC: 32.7 Gm/dL (69/67/89 38:10:17)    RDW: 17.7 % High (07/31/16 07:33:30)    MPV: 9.3 fL (07/31/16 07:33:30)    NRBC Percent: 0 % (07/31/16 07:33:30)        Impression and Plan    Chronic back pain        Falls        Hypokalemia        Hypotension        Weakness            66 y/o with PMH of COPD, hypertension, HFrEF, Bipolar, recurrent falls, and  concern for substance abuse in the past who presents to ED    with weakness and hypotension found to have AKI.            #Acute Renal Injury/Acute Liver Failure secondary to Hypotension and Shock  with underlying Sepsis/hypoperfusion - Resolved    - Continue to monitor BMP        #Sepsis due to cholangitis / choledocholithiasis 63m -Resolved    -with bacteremia due to klebsiella, billary course    -appreciate ID consult, patient will be on PO ABX x 14 days cipro and flagyl    -patient had MRCP with successful stone removal with IR    -drain in place, internal drainage scheduled for when output  decreases and bilirubin levels are normal    -patient will need follow up cholangiogram next week        #Acute electrolyte derangement - Potassium 3.4    - Replete as needed    -ctm        #Anemia - no apparent blood loss    -acute on chronic    -patient required 2 units of prbc's    -Continue to monitor H & H        #Bipolar -  Stable    -continue with home meds        #Recurrent falls    -will work with PT for discharging planning    -she is deferring rehab        #COPD -stable    -Continue bronchodilators    -Wears Oxygen PRN        PPX: DVT: Sequential boots when resting in bed, Early ambulation    GI: Pantoprazole    Dispo: Ongoing treatment of Bacteremia and cholangitis with PO antibiotics and  cholangio tube draining large amount of bile. Not able to have tube  internalized until bilirubin normalizes - most likely middle of next week.  Refusing rehab, will continue to assess discharge needs. COC involved.    [1] Progress/SOAP Note; Luberta Mutter, Anh 07/30/2016 15:05 EST    SIGNATURE LINE Electronically signed by Brynda Peon MD, Seema on 08/01/2016 at  13:00:35 EST

## 2016-07-21 NOTE — Progress Notes (Signed)
Subjective    "I am feeling really bad, I don't want to eat but I have been going to the BR"  Refused breakfast and have slight decrease in output from the cholangio tube.  AB CT was unremarkable - No abscesses or increased fluid collections.  Abdominal pain subsided.    Patient examined, chart reviewed, and discussed plan of care with healthcare  team.    Review of Systems    All systems have been reviewed and are negative unless identified in the HPI.    Objective    Vitals & Measurements    **T:** 97.4 F (Oral) **TMIN:** 97.0 F (Oral) **TMAX:** 98.5 F (Oral)  **RR:** 16 **BP:** 147/94 **SpO2:** 97% **O2 Method (L/min):** Room air  **WT:** 98.3 Kg    Physical Exam    General: Mild distress, asking appropriate questions, lying in bed, not  wanting to answer questions.    Neuro: A & O X 3. No focal deficits. Calm, following commands    Pulmonary: CTAB, no cough or wheeze. Non-labored respirations on Room Air.    Cardiac: Reg rate. No chest pain or shortness of breath. Bil LE edema    GI/GU: +BS, abd soft, Tender at tube insertion site. Slight distension. BM x 2  U/O: Voiding in BR    Skin: warm and dry. No rash    Musculoskeletal: MAE, Independent ambulation    Medications    _Inpatient_    albuterol 2.5 mg/3 mL (0.083%) inhalation solution, 2.5 mg= 3 mL, INHAL, q4hr,  PRN    amitriptyline, 50 mg= 1 tab(s), PO, HS    budesonide 0.5 mg/2 mL inhalation suspension, 0.5 mg= 2 mL, NEB, BID    Cipro, 500 mg= 2 tab(s), PO, BIDAC    clonazePAM, 1 mg= 1 tab(s), PO, BID    Cymbalta, 60 mg= 2 cap(s), PO, Daily    Dextrose 50% For Protocol, per protocol, IV Push, ud, PRN    Flagyl, 500 mg= 2 tab(s), PO, q8hr    formoterol 20 mcg/2 mL inhalation solution, 20 mcg= 2 mL, NEB, BID    glucagon, 1 mg= 1 EA, IM, ud, PRN    glucose 40% oral gel, 37.5 Gm= 1 EA, PO, ud, PRN    HumuLIN R Low, 0-12 Unit(s), sc, QIDACHS    indomethacin, 100 mg= 2 supp, PR, Once    Latuda, 120 mg= 6 tab(s), PO, HS    Metoprolol Tartrate  (Lopressor), 25 mg= 1 tab(s), PO, BID    MiraLax oral powder for reconstitution, 17 Gm= 1 EA, PO, Daily, PRN    ondansetron, 4 mg= 2 mL, IV Push, q8hr, PRN    oxyCODONE, 10 mg= 2 tab(s), PO, TID, PRN    Protonix, 40 mg= 1 tab(s), PO, ACB    Reglan, 10 mg= 2 mL, IV Push, q6hr, PRN    Spiriva, 18 mcg= 1 cap(s), INHAL, Daily    _Home_    amitriptyline, 50 mg, PO, HS    amLODIPine, 10 mg, PO, Daily    atenolol, 50 mg, PO, BID    Breo Ellipta 100 mcg-25 mcg/inh inhalation powder, 1 puff(s), INHAL, Daily    clonazePAM 1 mg oral tablet, 1 mg, PO, BID    Cymbalta 60 mg oral delayed release capsule, 60 mg= 1 cap(s), PO, Daily    ferrous gluconate 325 mg (36 mg elemental iron) oral tablet, 325 mg= 1 tab(s),  PO, Daily    furosemide 40 mg oral tablet, See Instructions    furosemide 40 mg oral  tablet, 40 mg= 1 tab(s), PO, Daily    Latuda, 120 mg, PO, HS    lisinopril, 40 mg, PO, Daily    omeprazole, 20 mg, PO, BID    oxyCODONE 10 mg oral tablet, 10 mg= 1 tab(s), PO, BID    Spiriva 18 mcg inhalation capsule, 18 mcg= 1 cap(s), INHAL, Daily    tiZANidine 4 mg oral tablet, 8 mg= 2 tab(s), PO, Daily    tiZANidine 4 mg oral tablet, 4 mg= 1 tab(s), PO, BID    Vitamin B12 250 mcg oral tablet, 250 mcg= 1 tab(s), PO, Daily    Vitamin D3, 1000 Unit(s), PO, Daily    Lab Results    Blood Glucose POC: 95 mg/dL (18/84/16 60:63:01)    Blood Glucose Testing Reason: Routine capillary blood sugar (08/03/16  21:04:44)    Glucose Lvl: 109 mg/dL (60/10/93 23:55:73)    BUN: 3 mg/dL Low (22/02/54 27:06:23)    Creatinine: 0.752 mg/dL (76/28/31 51:76:16)    Afn Amer Glomerular Filtration Rate: >90 (08/03/16 10:48:00)    Non-Afn Amer Glomerular Filtration Rate: 84 ml/min/1.58m2 (08/03/16 10:47:59)    Sodium Lvl: 143 mmol/L (08/03/16 10:47:56)    Potassium Lvl: 3.3 mmol/L Low (08/03/16 10:47:56)    Chloride: 110 mmol/L (08/03/16 10:47:56)    CO2: 24 mmol/L (08/03/16 10:47:56)    Anion Gap: 9 (08/03/16 10:47:56)    Total Protein: 5.5 Gm/dL Low (07/37/10  62:69:48)    Albumin Lvl: 2.1 Gm/dL Low (54/62/70 35:00:93)    Calcium Lvl: 8.1 mg/dL Low (81/82/99 37:16:96)    Magnesium Lvl: 1.9 mg/dL (78/93/81 01:75:10)    Bilirubin Total: 1.6 mg/dL High (25/85/27 78:24:23)    Bilirubin Direct: 1.3 mg/dL High (53/61/44 31:54:00)    Alkaline Phosphatase: 286 Units/L High (08/03/16 10:48:52)    AST: 24 Units/L (08/03/16 10:48:52)    ALT: 31 Units/L (08/03/16 10:48:52)    WBC: 7.5 thous/mm3 (08/03/16 10:11:45)    RBC: 2.84 Mil/mm3 Low (08/03/16 10:11:45)    Hgb: 9.3 Gm/dL Low (86/76/19 50:93:26)    Hct: 29.2 % Low (08/03/16 10:11:45)    Platelet: 417 thous/mm3 High (08/03/16 10:11:45)    MCV: 103 fL High (08/03/16 10:11:45)    MCH: 33 pGm High (08/03/16 10:11:45)    MCHC: 31.8 Gm/dL Low (71/24/58 09:98:33)    RDW: 17.2 % High (08/03/16 10:11:45)    MPV: 9.2 fL (08/03/16 10:11:45)    NRBC Percent: 0 % (08/03/16 10:11:45)    Diagnostic Results    AB CT - 08/03/2016    FINDINGS:        BONES: There is scoliosis noted along with multilevel lumbar degenerative  change    including severe facet degeneration with grade 2 anterolisthesis at L4-5.  There    is fusion of the vertebral bodies at T9-10 and there is mild anterior wedge    vertebral compression deformity at T12, unchanged. There are multiple healed  rib    fractures on the RIGHT. There are NO suspicious osseous findings.        LOWER CHEST: There is mild dependent atelectasis. There is a small RIGHT  pleural    effusion.        PERITONEUM: There is no pneumatosis, mesenteric venous gas or free air. There  is    no free fluid or organized extraluminal fluid collection. There is no    significant stranding in the peritoneal fat.        GI TRACT: Colonic diverticula are noted with no evidence of diverticulitis.  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 There is no abnormal bowel    distention or wall thickening. The appendix is not visualized.        HEPATOBILIARY: There is a percutaneous biliary drainage catheter entering  from    the RIGHT. The liver measures only 17 Hounsfield units on this postcontrast    examination, compatible with severe hepatic steatosis. The gallbladder is    surgically absent. The hepatobiliary structures appear otherwise unremarkable    for technique.        PANCREAS: The pancreas appears within normal limits for technique.        SPLEEN: The spleen is enlarged measuring 15.5 cm in length. No focal splenic    lesions are seen.        ADRENALS: The adrenal glands appear within normal limits.        GU: The kidneys enhance symmetrically and appear within normal limits for    technique with no contour deforming solid masses or hydronephrosis.        VASCULAR: There is mild scattered atherosclerotic disease. There is no  abdominal    aortic aneurysm. The portal vein, splenic vein and main mesenteric veins are    patent.        LYMPH NODES: There are scattered shotty lymph nodes with no adenopathy by size    criteria.        PELVIS: Prior total abdominal hysterectomy with BILATERAL salpingo-  oophorectomy.        ____________________________________________        IMPRESSION:        1\. Indwelling percutaneous biliary drainage catheter. Severe hepatic  steatosis.    No bile duct dilatation appreciated. Prior cholecystectomy.        2\. Mild splenomegaly.        3\. Small but enlarging RIGHT pleural effusion with thin pleural enhancement.    Please correlate clinically.        4\. Colonic diverticulosis and evidence of prior gastric bypass surgery.        5\. There is otherwise NO evidence of abscess.    ____________________________________________            IMPRESSION and PLAN:    Chronic back pain        Falls        Hypokalemia        Hypotension        Weakness            66 y/o with PMH of COPD, hypertension, HFrEF, Bipolar, recurrent falls, and  concern for substance abuse in the past who presents to ED    with weakness and hypotension found to have AKI.            #Acute Renal Injury/Acute Liver Failure  secondary to Hypotension and Shock  with underlying Sepsis/hypoperfusion - Resolved    - Continue to monitor BMP        #Sepsis due to cholangitis / choledocholithiasis 25m -Resolved    -with bacteremia due to klebsiella, billary course    -appreciate ID consult, patient will be on PO ABX cipro and flagyl x 14 days (Until 08/11/2016)    -patient had MRCP with successful stone removal with IR    -drain in place, internal drainage scheduled for when output decreases and bilirubin levels are normal    -patient will need follow up cholangiogram next week        #Acute electrolyte derangement - Potassium 3.3    -  Replete as needed    -ctm        #Anemia - no apparent blood loss. H & H= 9.3/29.2    -acute on chronic    -patient required 2 units of prbc's    -Continue to monitor H & H        #Bipolar - Stable    -continue with home meds        #Recurrent falls    -will work with PT for discharging planning    -she is deferring rehab        #COPD -stable    -Continue bronchodilators    -Wears Oxygen PRN        PPX: DVT: Sequential boots when resting in bed, Early ambulation    GI: Pantoprazole    Dispo: Ongoing treatment of Bacteremia and cholangitis with PO antibiotics and  cholangio tube draining slightly less today. Not able to have tube  internalized until bilirubin normalizes - most likely middle of next week.  Refusing rehab, will continue to assess discharge needs. COC involved.    [1] Progress/SOAP Note; Leah Hausen NP, Leah [name] 08/02/2016 15:45 EST    SIGNATURE LINE Electronically signed by Brynda Peon MD, Seema on 08/04/2016 at  08:23:20 EST

## 2016-07-21 NOTE — Progress Notes (Signed)
Subjective    "I would like to get home" No pain. Large amount of drainage from chole bag.  Drained last 24 hrs. More awake and "feels better"    Patient examined, chart reviewed, and discussed plan of care with healthcare  team.      Review of Systems    All systems have been reviewed and are negative unless identified in the HPI.    Objective    Vitals & Measurements    **T:** 96.8 F (Oral) **TMIN:** 96.8 F (Oral) **TMAX:** 98.1 F (Oral)  **RR:** 20 **BP:** 115/76 **SpO2:** 97% **O2 Method (L/min):** Room air  **WT:** 97 Kg    Physical Exam    General: NAD, asking appropriate questions, sitting up on commode  independently.    Neuro: A & O X 3. No focal deficits. Calm, following commands    Pulmonary: CTAB, no cough or wheeze. Non-labored respirations on Room Air.    Cardiac: Reg rate. No chest pain or shortness of breath. Bil LE edema    GI/GU: +BS, abd soft, Tender at tube insertion site. Non-distended. BM x 2  U/O: Voiding in BR    Skin: warm and dry. No rash    Musculoskeletal: MAE, Independent ambulation    Medications    _Inpatient_    albuterol 2.5 mg/3 mL (0.083%) inhalation solution, 2.5 mg= 3 mL, INHAL, q4hr,  PRN    amitriptyline, 50 mg= 1 tab(s), PO, HS    budesonide 0.5 mg/2 mL inhalation suspension, 0.5 mg= 2 mL, NEB, BID    Cipro, 500 mg= 2 tab(s), PO, BIDAC    clonazePAM, 1 mg= 1 tab(s), PO, BID    Cymbalta, 60 mg= 2 cap(s), PO, Daily    Dextrose 50% For Protocol, per protocol, IV Push, ud, PRN    Flagyl, 500 mg= 2 tab(s), PO, q8hr    formoterol 20 mcg/2 mL inhalation solution, 20 mcg= 2 mL, NEB, BID    glucagon, 1 mg= 1 EA, IM, ud, PRN    glucose 40% oral gel, 37.5 Gm= 1 EA, PO, ud, PRN    HumuLIN R Low, 0-12 Unit(s), sc, QIDACHS    indomethacin, 100 mg= 2 supp, PR, Once    Latuda, 120 mg= 6 tab(s), PO, HS    Metoprolol Tartrate (Lopressor), 25 mg= 1 tab(s), PO, BID    MiraLax oral powder for reconstitution, 17 Gm= 1 EA, PO, Daily, PRN    ondansetron, 4 mg= 2 mL, IV Push, q8hr,  PRN    oxyCODONE, 10 mg= 2 tab(s), PO, TID, PRN    Protonix, 40 mg= 1 tab(s), PO, ACB    Reglan, 10 mg= 2 mL, IV Push, q6hr, PRN    Spiriva, 18 mcg= 1 cap(s), INHAL, Daily    _Home_    amitriptyline, 50 mg, PO, HS    amLODIPine, 10 mg, PO, Daily    atenolol, 50 mg, PO, BID    Breo Ellipta 100 mcg-25 mcg/inh inhalation powder, 1 puff(s), INHAL, Daily    clonazePAM 1 mg oral tablet, 1 mg, PO, BID    Cymbalta 60 mg oral delayed release capsule, 60 mg= 1 cap(s), PO, Daily    ferrous gluconate 325 mg (36 mg elemental iron) oral tablet, 325 mg= 1 tab(s),  PO, Daily    furosemide 40 mg oral tablet, See Instructions    furosemide 40 mg oral tablet, 40 mg= 1 tab(s), PO, Daily    Latuda, 120 mg, PO, HS    lisinopril, 40 mg, PO, Daily  omeprazole, 20 mg, PO, BID    oxyCODONE 10 mg oral tablet, 10 mg= 1 tab(s), PO, BID    Spiriva 18 mcg inhalation capsule, 18 mcg= 1 cap(s), INHAL, Daily    tiZANidine 4 mg oral tablet, 8 mg= 2 tab(s), PO, Daily    tiZANidine 4 mg oral tablet, 4 mg= 1 tab(s), PO, BID    Vitamin B12 250 mcg oral tablet, 250 mcg= 1 tab(s), PO, Daily    Vitamin D3, 1000 Unit(s), PO, Daily    Lab Results    Blood Glucose POC: 105 mg/dL (36/64/40 34:74:25)    Blood Glucose Testing Reason: Routine capillary blood sugar (08/02/16  11:50:11)    Glucose Lvl: 89 mg/dL (95/63/87 56:43:32)    BUN: 3 mg/dL Low (95/18/84 16:60:63)    Creatinine: 0.591 mg/dL (01/60/10 93:23:55)    Afn Amer Glomerular Filtration Rate: >90 (08/02/16 08:12:04)    Non-Afn Amer Glomerular Filtration Rate: >90 (08/02/16 08:12:06)    Sodium Lvl: 143 mmol/L (08/02/16 08:12:01)    Potassium Lvl: 3.4 mmol/L Low (08/02/16 08:12:01)    Chloride: 111 mmol/L High (08/02/16 08:12:01)    CO2: 24 mmol/L (08/02/16 08:12:01)    Anion Gap: 8 (08/02/16 08:12:01)    Total Protein: 5.3 Gm/dL Low (73/22/02 54:27:06)    Albumin Lvl: 1.6 Gm/dL Low (23/76/28 31:51:76)    Calcium Lvl: 7.9 mg/dL Low (16/07/37 10:62:69)    Magnesium Lvl: 1.8 mg/dL (48/54/62 70:35:00)     Bilirubin Total: 1.6 mg/dL High (93/81/82 99:37:16)    Bilirubin Direct: 1.1 mg/dL High (96/78/93 81:01:75)    Alkaline Phosphatase: 260 Units/L High (08/02/16 08:12:03)    AST: 22 Units/L (08/02/16 08:12:03)    ALT: 33 Units/L (08/02/16 08:12:03)    WBC: 6.2 thous/mm3 (08/02/16 07:33:19)    RBC: 2.48 Mil/mm3 Low (08/02/16 07:33:19)    Hgb: 8.2 Gm/dL Low (04/28/84 27:78:24)    Hct: 26.1 % Low (08/02/16 07:33:19)    Platelet: 287 thous/mm3 (08/02/16 07:33:19)    MCV: 105 fL High (08/02/16 07:33:19)    MCH: 33 pGm High (08/02/16 07:33:19)    MCHC: 31.4 Gm/dL Low (23/53/61 44:31:54)    RDW: 17.2 % High (08/02/16 07:33:19)    MPV: 9.5 fL (08/02/16 07:33:19)    NRBC Percent: 0 % (08/02/16 07:33:19)        IMPRESSION and PLAN:    Chronic back pain        Falls        Hypokalemia        Hypotension        Weakness            66 y/o with PMH of COPD, hypertension, HFrEF, Bipolar, recurrent falls, and  concern for substance abuse in the past who presents to ED    with weakness and hypotension found to have AKI.            #Acute Renal Injury/Acute Liver Failure secondary to Hypotension and Shock  with underlying Sepsis/hypoperfusion - Resolved    - Continue to monitor BMP        #Sepsis due to cholangitis / choledocholithiasis 65m -Resolved    -with bacteremia due to klebsiella, billary course    -appreciate ID consult, patient will be on PO ABX cipro and flagyl x 14 days (Until 08/11/2016)     -patient had MRCP with successful stone removal with IR    -drain in place, internal drainage scheduled for when output decreases and bilirubin levels are normal    -patient will need follow  up cholangiogram next week        #Acute electrolyte derangement - Potassium 3.4    - Replete as needed    -ctm        #Anemia - no apparent blood loss. H & H= 8.2/26.1    -acute on chronic    -patient required 2 units of prbc's    -Continue to monitor H & H        #Bipolar - Stable    -continue with home meds        #Recurrent falls    -will work  with PT for discharging planning    -she is deferring rehab        #COPD -stable    -Continue bronchodilators    -Wears Oxygen PRN        PPX: DVT: Sequential boots when resting in bed, Early ambulation    GI: Pantoprazole    Dispo: Ongoing treatment of Bacteremia and cholangitis with PO antibiotics and  cholangio tube draining large amount of bile. Not able to have tube  internalized until bilirubin normalizes - most likely middle of next week.  Refusing rehab, will continue to assess discharge needs. COC involved.    SIGNATURE LINE Electronically signed by Brynda Peon MD, Seema on 08/03/2016 at  08:54:15 EST

## 2016-07-21 NOTE — Progress Notes (Signed)
Subjective    "I still have nausea and the pain is getting worse" Pain 8/10. Decreased  appetite. Decreased output from choleangio bag. GI consulted and recommending  cholangiogram in AM.    Patient examined, chart reviewed, and discussed plan of care with healthcare  team.    Review of Systems    All systems have been reviewed and are negative unless identified in the HPI.    Objective    Vitals & Measurements    **T:** 98.9 F (Oral) **TMIN:** 97.4 F (Oral) **TMAX:** 98.9 F (Oral)  **RR:** 22 **BP:** 170/90 **SpO2:** 96% **O2 Method (L/min):** Room air    Physical Exam    General: Mild distress, asking appropriate questions, lying in bed, having  increased pain, and nausea. Not moving around as much today    Neuro: A & O X 3. No focal deficits. Calm, following commands    Pulmonary: CTAB, no cough or wheeze. Non-labored respirations on Room Air.    Cardiac: Reg rate. No chest pain or shortness of breath. Bil LE edema    GI/GU: +BS, abd soft, Tender at tube insertion site. Distension increased from  1/30. BM x 2, soft solid. U/O: Voiding in BR    Skin: warm and dry. No rash    Musculoskeletal: MAE, Independent ambulation , working with PT    Medications    _Inpatient_    albuterol 2.5 mg/3 mL (0.083%) inhalation solution, 2.5 mg= 3 mL, INHAL, q4hr,  PRN    amitriptyline, 50 mg= 1 tab(s), PO, HS    budesonide 0.5 mg/2 mL inhalation suspension, 0.5 mg= 2 mL, NEB, BID    clonazePAM, 1 mg= 1 tab(s), PO, BID    Cymbalta, 60 mg= 2 cap(s), PO, Daily    Dextrose 50% For Protocol, per protocol, IV Push, ud, PRN    formoterol 20 mcg/2 mL inhalation solution, 20 mcg= 2 mL, NEB, BID    glucagon, 1 mg= 1 EA, IM, ud, PRN    glucose 40% oral gel, 37.5 Gm= 1 EA, PO, ud, PRN    HumuLIN R Low, 0-12 Unit(s), sc, QIDACHS    indomethacin, 100 mg= 2 supp, PR, Once    Latuda, 120 mg= 6 tab(s), PO, HS    Metoprolol Tartrate (Lopressor), 50 mg= 1 tab(s), PO, BID    MiraLax oral powder for reconstitution, 17 Gm= 1 EA, PO, Daily,  PRN    ondansetron, 4 mg= 2 mL, IV Push, q8hr, PRN    oxyCODONE, 10 mg= 2 tab(s), PO, TID, PRN    Protonix, 40 mg= 1 tab(s), PO, ACB    Reglan, 10 mg= 2 mL, IV Push, q6hr, PRN    Spiriva, 18 mcg= 1 cap(s), INHAL, Daily    Zosyn    _Home_    amitriptyline, 50 mg, PO, HS    amLODIPine, 10 mg, PO, Daily    atenolol, 50 mg, PO, BID    Breo Ellipta 100 mcg-25 mcg/inh inhalation powder, 1 puff(s), INHAL, Daily    clonazePAM 1 mg oral tablet, 1 mg, PO, BID    Cymbalta 60 mg oral delayed release capsule, 60 mg= 1 cap(s), PO, Daily    ferrous gluconate 325 mg (36 mg elemental iron) oral tablet, 325 mg= 1 tab(s),  PO, Daily    furosemide 40 mg oral tablet, See Instructions    furosemide 40 mg oral tablet, 40 mg= 1 tab(s), PO, Daily    Latuda, 120 mg, PO, HS    lisinopril, 40 mg, PO, Daily  omeprazole, 20 mg, PO, BID    oxyCODONE 10 mg oral tablet, 10 mg= 1 tab(s), PO, BID    Spiriva 18 mcg inhalation capsule, 18 mcg= 1 cap(s), INHAL, Daily    tiZANidine 4 mg oral tablet, 8 mg= 2 tab(s), PO, Daily    tiZANidine 4 mg oral tablet, 4 mg= 1 tab(s), PO, BID    Vitamin B12 250 mcg oral tablet, 250 mcg= 1 tab(s), PO, Daily    Vitamin D3, 1000 Unit(s), PO, Daily    Lab Results    Blood Glucose POC: 112 mg/dL High (81/19/14 78:29:56)    Blood Glucose Testing Reason: Routine capillary blood sugar (08/04/16  16:05:17)    Glucose Lvl: 100 mg/dL (21/30/86 57:84:69)    BUN: 2 mg/dL Low (62/95/28 41:32:44)    Creatinine: 0.712 mg/dL (07/06/70 53:66:44)    Afn Amer Glomerular Filtration Rate: >90 (08/04/16 07:41:50)    Non-Afn Amer Glomerular Filtration Rate: 90 ml/min/1.71m2 (08/04/16 07:41:48)    Sodium Lvl: 141 mmol/L (08/04/16 07:41:46)    Potassium Lvl: 3.9 mmol/L (08/04/16 07:41:46)    Chloride: 109 mmol/L (08/04/16 07:41:46)    CO2: 25 mmol/L (08/04/16 07:41:46)    Anion Gap: 7 (08/04/16 07:41:46)    Total Protein: 5.5 Gm/dL Low (03/47/42 59:56:38)    Albumin Lvl: 1.9 Gm/dL Low (75/64/33 29:51:88)    Calcium Lvl: 7.9 mg/dL Low  (41/66/06 30:16:01)    Magnesium Lvl: 2 mg/dL (09/32/35 57:32:20)    Bilirubin Total: 1.5 mg/dL High (25/42/70 62:37:62)    Bilirubin Direct: 1.1 mg/dL High (83/15/17 61:60:73)    Alkaline Phosphatase: 238 Units/L High (08/04/16 07:41:47)    AST: 19 Units/L (08/04/16 07:41:47)    ALT: 23 Units/L (08/04/16 07:41:47)    WBC: 8 thous/mm3 (08/04/16 07:09:42)    RBC: 2.74 Mil/mm3 Low (08/04/16 07:09:42)    Hgb: 9 Gm/dL Low (71/06/26 94:85:46)    Hct: 28.3 % Low (08/04/16 07:09:42)    Platelet: 375 thous/mm3 (08/04/16 07:09:42)    MCV: 103 fL High (08/04/16 07:09:42)    MCH: 33 pGm High (08/04/16 07:09:42)    MCHC: 31.8 Gm/dL Low (27/03/50 09:38:18)    RDW: 17.4 % High (08/04/16 07:09:42)    MPV: 9 fL (08/04/16 07:09:42)    NRBC Percent: 0 % (08/04/16 07:09:42)    Diagnostic Results    AB CT - 08/03/2016    FINDINGS:        BONES: There is scoliosis noted along with multilevel lumbar degenerative  change    including severe facet degeneration with grade 2 anterolisthesis at L4-5.  There    is fusion of the vertebral bodies at T9-10 and there is mild anterior wedge    vertebral compression deformity at T12, unchanged. There are multiple healed  rib    fractures on the RIGHT. There are NO suspicious osseous findings.        LOWER CHEST: There is mild dependent atelectasis. There is a small RIGHT  pleural    effusion.        PERITONEUM: There is no pneumatosis, mesenteric venous gas or free air. There  is    no free fluid or organized extraluminal fluid collection. There is no    significant stranding in the peritoneal fat.        GI TRACT: Colonic diverticula are noted with no evidence of diverticulitis.    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 There is no abnormal bowel    distention or wall thickening. The appendix is not visualized.  HEPATOBILIARY: There is a percutaneous biliary drainage catheter entering from    the RIGHT. The liver measures only 17 Hounsfield units on this postcontrast    examination,  compatible with severe hepatic steatosis. The gallbladder is    surgically absent. The hepatobiliary structures appear otherwise unremarkable    for technique.        PANCREAS: The pancreas appears within normal limits for technique.        SPLEEN: The spleen is enlarged measuring 15.5 cm in length. No focal splenic    lesions are seen.        ADRENALS: The adrenal glands appear within normal limits.        GU: The kidneys enhance symmetrically and appear within normal limits for    technique with no contour deforming solid masses or hydronephrosis.        VASCULAR: There is mild scattered atherosclerotic disease. There is no  abdominal    aortic aneurysm. The portal vein, splenic vein and main mesenteric veins are    patent.        LYMPH NODES: There are scattered shotty lymph nodes with no adenopathy by size    criteria.        PELVIS: Prior total abdominal hysterectomy with BILATERAL salpingo-  oophorectomy.        ____________________________________________        IMPRESSION:        1\. Indwelling percutaneous biliary drainage catheter. Severe hepatic  steatosis.    No bile duct dilatation appreciated. Prior cholecystectomy.        2\. Mild splenomegaly.        3\. Small but enlarging RIGHT pleural effusion with thin pleural enhancement.    Please correlate clinically.        4\. Colonic diverticulosis and evidence of prior gastric bypass surgery.        5\. There is otherwise NO evidence of abscess.    ____________________________________________            IMPRESSION and PLAN:    Chronic back pain        Falls        Hypokalemia        Hypotension        Weakness            66 y/o with PMH of COPD, hypertension, HFrEF, Bipolar, recurrent falls, and  concern for substance abuse in the past who presents to ED    with weakness and hypotension found to have AKI.            #Acute Renal Injury/Acute Liver Failure secondary to Hypotension and Shock  with underlying Sepsis/hypoperfusion - Resolved    - Continue to  monitor BMP        #Sepsis due to cholangitis / choledocholithiasis 45m -Resolved    -with bacteremia due to klebsiella, biliary course    -appreciate ID consult, given nausea Re-start IV zosyn - DC Cipro and Flagyl    -patient had MRCP with successful stone removal with IR    -drain in place, internal drainage scheduled for when output decreases and bilirubin levels are normal    -patient to have follow up cholangiogram tomorrow. 2/1    -NPO after midnight    -PRN IV dilaudid 0.5mg  for short term relief of pain and PO Oxycodone TID, PRN        #Vaginitis: Having increased discomfort in peri area and labia areas    - PO Fluconazole given x 1 150mg   for possible yeast infection on 1/30        #Acute electrolyte derangement - Potassium 3.9    - Replete as needed    -ctm        #Anemia - no apparent blood loss. H & H= 9.0/28.3    -acute on chronic    -patient required 2 units of prbc's    -Continue to monitor H & H        #Bipolar - Stable    -On Cymbalta and Amitriptyline    -Qtc on 1/17 = 511, Will obtain an ECG for follow up    - May need to decrease Cymbalta dose    - Psych consult to review the meds and determine if her pain and nausea could  be from the combination of meds at high doses        #Recurrent falls    -will work with PT for discharging planning    -she is deferring rehab        #COPD -stable    -Continue bronchodilators    -Wears Oxygen PRN        PPX: DVT: Sequential boots when resting in bed, Early ambulation    GI: Pantoprazole    Dispo: Ongoing treatment of Bacteremia and cholangitis with IV Zosyn re-  started and cholangio tube draining less today. Not able to have tube  internalized until bilirubin normalizes - most likely this week. Cholangiogram  tomorrow. Having increased pain and nausea possibly from medication  interactions. Psychiatry consulted for tomorrow. Refusing rehab, will continue  to assess discharge needs. COC involved.    [1] Progress/SOAP Note; Johnathan Hausen NP, Claris Che 08/03/2016  11:45 EST    SIGNATURE LINE Electronically signed by Brynda Peon MD, Seema on 08/05/2016 at  08:49:34 EST

## 2016-07-21 NOTE — Progress Notes (Signed)
Date of Admission    07/21/2016    Date of Discharge    pending discharge    Admission History        CHIEF COMPLAINT: Status post fall and weakness.        HISTORY OF PRESENT ILLNESS: The patient is a 66 year old female with past    medical history of bipolar disorder, recurrent falls, anxiety and  hypertension,    who presented to the ED with history of feeling dizzy for the last couple of    days associated with weakness. Patient was admitting that she fell early    yesterday due to increasing weakness and stayed on the floor a couple hours as    she lives alone and could not get any help and was unable to get up. She has    chronic back pain and has chronic gait disorder with problems ambulating. She    was stating that she was walking and then fell and was more dizzy and for this    reason, she fell, but did not hit her head. She had similar episode in    December and was admitted to this hospital and was discharged with diagnosis    of acute kidney injury due to the dehydration. Patient, as well, has been    complaining of worsening nausea and yesterday morning had a couple episodes of    non-bloody vomiting , although she has been drinking plenty of fluids. When  the    EMS arrived, they found her with blood pressure of 70/40 and for this reason,    was IV fluid resuscitated and brought to the ER. On arrival to the Emergency    Department, she was hypotensive with blood pressure 75/41. O2 saturation 96%    on room air. Heart rate 64. Respiration rate 18. She was maintaining well.    She was brought for head and CS spine CT which both of them were unremarkable.    The patient did receive in total 4 liters of crystalloids. Labs were  remarkable    for creatinine of 2.7, potassium 2.4, anion gap 13, normal LFTs, hemoglobin  8.2.    EKG showed normal sinus rhythm without acute ST changes. Due to persistent    hypotension and concern for sepsis, patient was transferred to ICU for    further evaluation.             [1]    Code Status    Code Status - Ordered    -- 07/21/16 23:39:00 EST, Full Resuscitation, Constant Order          Allergies    NKA    Social History    _Alcohol_    Past    _Tobacco_    Current every day smoker, Cigarettes    Hospital Course    PAST MEDICAL HISTORY:    1\. Chronic back pain.    2\. Peripheral neuropathy.    3\. Scoliosis.    4\. Falls.    5\. Bipolar disorder.    6\. Coronary artery disease, status post bypass.    7\. Anxiety.    8\. Tobacco abuse disorder.    9\. Hypertension.    10\. COPD.    11\. Alcohol abuse disorder.    12\. chronic anemia.            Admitted on 1/17 to ICU transfer to hospital medicine 1/19 with history of  fall and weakness.    Fall work up unrevealing for fracture  or intracranial abnormality    During hospitalization patient was treated for Sepsis due to [facility]  bacteremia for completed Zosyn now abx day#12/14,    transition to Levofloxacin at discharge. Repeat Blood culture negative,  hemodynamically stable            Transaminitis initially thought as Shock liver due to hypotension, imaging US  abdomen unrevealing underwent CT abdomen pelvis    and MRI, found to have cholidocholitiasis under Ultrasound fluoro guided stone  retrieval now s/p internal external drain    which had been putting out externally. Now cap trial. IR recommends, close  external drain and allow internal drain    monitor for signs and symptoms of infection.        Acute renal failure resolved        Acute transaminase elevation.        Electrolyte abnormality replaced        DVT prophylaxis. Activity has tolerates    [2]    Procedures and Treatment Provided    CT head:/CT spine no acute process    US abdomen 07/22/2016:    IMPRESSION:    Echogenic liver consistent with underlying parenchymal disease, most commonly    fatty infiltration.    Splenomegaly.    [3]        MRI Cholangio 07/27/2016:    FINDINGS:    Patient motion artifact mildly degrades several sequences. Within this  confine:         In the included lower thorax, there appears to be right middle lobe airspace    disease.        There is marked diffuse loss of hepatic signal on the out of phase images,    consistent with hepatic steatosis. No suspicious hepatic lesions are seen.    Multiple scattered tiny hepatic cysts are noted. There is moderate to severe    intrahepatic biliary ductal dilatation. The CBD is dilated measuring up to 14    mm. There is a 13 x 9 mm hypointense filling defect in the distal CBD  consistent    with choledocholithiasis (series 3, image 12 and series 4, image 10).        The gallbladder is surgically absent.        The pancreatic duct is not dilated. The pancreas, adrenal glands and spleen  are    unremarkable. There is no hydronephrosis. The kidneys appear normal.        There is no ascites. No dilated loops of bowel are seen. There is no    retroperitoneal lymphadenopathy. The abdominal aorta is not dilated.        Degenerative changes of the visualized spine are noted.        IMPRESSION:    1\. Moderate-severe intra- and extra-hepatic biliary ductal dilatation, with  13    mm filling defect in the distal CBD consistent with choledocholithiasis.    2\. Significant hepatic steatosis.    3\. Right middle lobe airspace disease.        [4]            CT abdomen pevis 08/03/2016:            IMPRESSION:        1\. Indwelling percutaneous biliary drainage catheter. Severe hepatic  steatosis.    No bile duct dilatation appreciated. Prior cholecystectomy.        2\. Mild splenomegaly.        3\. Small but enlarging RIGHT pleural effusion with  thin pleural enhancement.    Please correlate clinically.        4\. Colonic diverticulosis and evidence of prior gastric bypass surgery.        5\. There is otherwise NO evidence of abscess.    [5]    Physical Exam    Vitals & Measurements    **T:** 97.3 F (Oral) **TMIN:** 97 F (Oral) **TMAX:** 97.6 F (Oral) **RR:**  16 **BP:** 144/78 **SpO2:** 100% **O2 Method (L/min):** Room  air **WT:** 86.6  Kg    NAD    s1s2    chest clear    soft abdomen    Lab Results    Blood Glucose POC: 94 mg/dL (16/10/96 04:54:09)    Blood Glucose Testing Reason: Routine capillary blood sugar (08/10/16  22:48:40)    Glucose Lvl: 75 mg/dL (81/19/14 78:29:56)    BUN: 5 mg/dL Low (21/30/86 57:84:69)    Creatinine: 0.702 mg/dL (62/95/28 41:32:44)    Afn Amer Glomerular Filtration Rate: >90 (08/10/16 07:41:20)    Non-Afn Amer Glomerular Filtration Rate: >90 (08/10/16 07:41:22)    Sodium Lvl: 145 mmol/L (08/10/16 07:41:18)    Potassium Lvl: 3.7 mmol/L (08/10/16 07:41:18)    Chloride: 114 mmol/L High (08/10/16 07:41:18)    CO2: 22 mmol/L (08/10/16 07:41:18)    Anion Gap: 9 (08/10/16 07:41:18)    Total Protein: 5.5 Gm/dL Low (07/06/70 53:66:44)    Albumin Lvl: 1.7 Gm/dL Low (03/47/42 59:56:38)    Calcium Lvl: 7.8 mg/dL Low (75/64/33 29:51:88)    Magnesium Lvl: 2.2 mg/dL (41/66/06 30:16:01)    Bilirubin Total: 0.9 mg/dL (09/32/35 57:32:20)    Bilirubin Direct: 0.5 mg/dL High (25/42/70 62:37:62)    Alkaline Phosphatase: 170 Units/L High (08/10/16 07:41:19)    AST: 21 Units/L (08/10/16 07:41:19)    ALT: 15 Units/L (08/10/16 07:41:19)    WBC: 6.6 thous/mm3 (08/10/16 07:21:04)    RBC: 2.62 Mil/mm3 Low (08/10/16 07:21:04)    Hgb: 8.8 Gm/dL Low (83/15/17 61:60:73)    Hct: 27.4 % Low (08/10/16 07:21:04)    Platelet: 395 thous/mm3 (08/10/16 07:21:04)    MCV: 105 fL High (08/10/16 07:21:04)    MCH: 34 pGm High (08/10/16 07:21:04)    MCHC: 32.1 Gm/dL (71/06/26 94:85:46)    RDW: 17.2 % High (08/10/16 07:21:04)    MPV: 9.2 fL (08/10/16 07:21:04)    NRBC Percent: 0 % (08/10/16 07:21:04)          Discharge Diagnoses    Chronic back pain    Fall    Fall    Hypokalemia    Hypotension    Hypotension    Weakness    Weakness    Sepsis due to Kliebsiella    Transaminitis    Acute renal failure    Discharge Medications    _Discharge_    amitriptyline, 50 mg, PO, HS    amLODIPine, 10 mg, PO, Daily    atenolol, 50 mg, PO, BID    Breo Ellipta  100 mcg-25 mcg/inh inhalation powder, 1 puff(s), INHAL, Daily    clonazePAM 1 mg oral tablet, 1 mg, PO, BID    Cymbalta 60 mg oral delayed release capsule, 60 mg= 1 cap(s), PO, Daily    ferrous gluconate 325 mg (36 mg elemental iron) oral tablet, 325 mg= 1 tab(s),  PO, Daily    furosemide 40 mg oral tablet, See Instructions    furosemide 40 mg oral tablet, 40 mg= 1 tab(s), PO, Daily    Latuda, 120 mg, PO, HS    lisinopril, 40  mg, PO, Daily    omeprazole, 20 mg, PO, BID    oxyCODONE 10 mg oral tablet, 10 mg= 1 tab(s), PO, BID    Spiriva 18 mcg inhalation capsule, 18 mcg= 1 cap(s), INHAL, Daily    tiZANidine 4 mg oral tablet, 8 mg= 2 tab(s), PO, Daily    tiZANidine 4 mg oral tablet, 4 mg= 1 tab(s), PO, BID    Vitamin B12 250 mcg oral tablet, 250 mcg= 1 tab(s), PO, Daily    Vitamin D3, 1000 Unit(s), PO, Daily    Discharge Instructions    HOme with vna      Counseling    Face to Face    [1] History and Physical; Sherre Poot MD, Regan Rakers 07/21/2016 00:00 EST    [2] Hospitalist Progress Note; Abigail Miyamoto MD, Beri 07/25/2016 09:27 EST    [3] US Abdomen Complete; Edyth Gunnels MD, Lillia Abed 07/22/2016 07:07 EST    [4] MRI Cholangiopancreatography; Domenica Fail MD, [place]ham 07/27/2016 17:22 EST    [5] CT Abdomen and Pelvis C+; Gery Pray MD, Judie Bonus 08/03/2016 15:05 EST    SIGNATURE LINE Electronically signed by San Jetty, Ichiro Chesnut on 08/10/2016  at 23:57:17 EST

## 2016-07-21 NOTE — Progress Notes (Signed)
Subjective    Review of Systems    Objective    Vitals & Measurements    **T:** 99.2 F (Oral) **TMIN:** 98.0 F (Oral) **TMAX:** 100.5 F (Oral)  **RR:** 18 **BP:** 133/74 **SpO2:** 99% **O2 Rate:** 1 **O2 Method (L/min):**  Nasal cannula    Physical Exam    Medications    _Inpatient_    albuterol 2.5 mg/3 mL (0.083%) inhalation solution, 2.5 mg= 3 mL, INHAL, q4hr,  PRN    amitriptyline, 50 mg= 1 tab(s), PO, HS    budesonide 0.5 mg/2 mL inhalation suspension, 0.5 mg= 2 mL, NEB, BID    clonazePAM, 1 mg= 1 tab(s), PO, BID    Cymbalta, 60 mg= 2 cap(s), PO, Daily    Dextrose 50% For Protocol, per protocol, IV Push, ud, PRN    formoterol 20 mcg/2 mL inhalation solution, 20 mcg= 2 mL, NEB, BID    glucagon, 1 mg= 1 EA, IM, ud, PRN    glucose 40% oral gel, 37.5 Gm= 1 EA, PO, ud, PRN    heparin, 5000 Unit(s)= 1 mL, sc, q8hr    HumuLIN R Low, 0-12 Unit(s), sc, QIDACHS    Latuda, 120 mg= 6 tab(s), PO, HS    ondansetron, 4 mg= 2 mL, IV Push, q8hr, PRN    oxyCODONE, 5 mg= 1 tab(s), PO, q6hr, PRN    Potassium Chloride Oral, 40 mEq= 30 mL, PO, Once    Protonix, 40 mg= 1 tab(s), PO, ACB    Spiriva, 18 mcg= 1 cap(s), INHAL, Daily    Zosyn    _Home_    amitriptyline, 50 mg, PO, HS    amLODIPine, 10 mg, PO, Daily    atenolol, 50 mg, PO, BID    Breo Ellipta 100 mcg-25 mcg/inh inhalation powder, 1 puff(s), INHAL, Daily    clonazePAM 1 mg oral tablet, 1 mg, PO, BID    Cymbalta 60 mg oral delayed release capsule, 60 mg= 1 cap(s), PO, Daily    ferrous gluconate 325 mg (36 mg elemental iron) oral tablet, 325 mg= 1 tab(s),  PO, Daily    furosemide 40 mg oral tablet, See Instructions    furosemide 40 mg oral tablet, 40 mg= 1 tab(s), PO, Daily    Latuda, 120 mg, PO, HS    lisinopril, 40 mg, PO, Daily    omeprazole, 20 mg, PO, BID    oxyCODONE 10 mg oral tablet, 10 mg= 1 tab(s), PO, BID    Spiriva 18 mcg inhalation capsule, 18 mcg= 1 cap(s), INHAL, Daily    tiZANidine 4 mg oral tablet, 8 mg= 2 tab(s), PO, Daily    tiZANidine 4 mg oral  tablet, 4 mg= 1 tab(s), PO, BID    Vitamin B12 250 mcg oral tablet, 250 mcg= 1 tab(s), PO, Daily    Vitamin D3, 1000 Unit(s), PO, Daily    Lab Results    Blood Glucose POC: 109 mg/dL (16/10/96 04:54:09)    Blood Glucose Testing Reason: Routine capillary blood sugar (07/27/16  11:47:00)    Glucose Lvl: 92 mg/dL (81/19/14 78:29:56)    BUN: 7 mg/dL (21/30/86 57:84:69)    Creatinine: 0.777 mg/dL (62/95/28 41:32:44)    Afn Amer Glomerular Filtration Rate: >90 (07/27/16 08:25:31)    Non-Afn Amer Glomerular Filtration Rate: 81 ml/min/1.36m2 (07/27/16 08:25:29)    Sodium Lvl: 141 mmol/L (07/27/16 08:25:28)    Potassium Lvl: 3.5 mmol/L Low (07/27/16 08:25:28)    Chloride: 112 mmol/L High (07/27/16 08:25:28)    CO2: 20 mmol/L Low (07/27/16 08:25:28)  Anion Gap: 9 (07/27/16 08:25:28)    Total Protein: 5.5 Gm/dL Low (54/09/81 19:14:78)    Albumin Lvl: 1.7 Gm/dL Low (29/56/21 30:86:57)    Calcium Lvl: 7.6 mg/dL Low (84/69/62 95:28:41)    Magnesium Lvl: 1.9 mg/dL (32/44/01 02:72:53)    Bilirubin Total: 4 mg/dL High (66/44/03 47:42:59)    Bilirubin Direct: 3.1 mg/dL High (56/38/75 64:33:29)    Alkaline Phosphatase: 441 Units/L High (07/27/16 08:25:28)    AST: 150 Units/L High (07/27/16 08:25:28)    ALT: 150 Units/L High (07/27/16 08:25:28)    WBC: 7.1 thous/mm3 (07/27/16 08:02:25)    RBC: 2.09 Mil/mm3 Low (07/27/16 08:02:25)    Hgb: 7.3 Gm/dL Low (51/88/41 66:06:30)    Hct: 23.1 % Low (07/27/16 08:02:25)    Platelet: 266 thous/mm3 (07/27/16 08:02:25)    MCV: 110 fL High (07/27/16 08:02:25)    MCH: 35 pGm High (07/27/16 08:02:25)    MCHC: 31.6 Gm/dL Low (16/01/09 32:35:57)    RDW: 13.7 % (07/27/16 08:02:25)    MPV: 9.6 fL (07/27/16 08:02:25)    Neutrophils: 78 % High (07/27/16 08:02:26)    Lymphocytes: 12 % Low (07/27/16 08:02:26)    Monocytes: 8 % (07/27/16 08:02:26)    Eosinophils: 1 % (07/27/16 08:02:26)    Basophils: 0 % (07/27/16 08:02:26)    Immature Granulocytes: 1 % (07/27/16 08:02:26)    NRBC Percent: 0 % (07/27/16  08:02:25)    Absolute Neutro Count: 5500 /mm3 (07/27/16 08:02:26)    PT: 12.1 sec High (07/27/16 08:09:43)    INR: 1.1 (07/27/16 08:09:43)          Diagnostic Results    * Final Report *        Reason For Exam    Abdominal Pain Generalized        FINAL REPORT    REPORT: SITE READ:6            Procedure: CT Abdomen and Pelvis C+ 07/26/2016 11:52 AM    Indications: Abdominal Pain Generalized    Comparison: Chest radiograph 07/24/2016, gastric emptying study 06/17/2016,  chest    CT 12/03/2014, abdomen ultrasound 07/22/2016        TECHNIQUE:    CT of the abdomen and pelvis with intravenous contrast.        FINDINGS:    LOWER THORAX: The imaged lung bases demonstrate reticular and groundglass    opacities, incompletely imaged and therefore accurate comparison with prior    study is somewhat difficult. The consolidation seen in the lower lobes appears    improved although the groundglass opacification in the upper lobes could    possibly be increased in extent when compared to prior. Trace right pleural    effusion, decreased when compared to prior study        HEPATOBILIARY: Markedly decreased hepatic attenuation, compatible with fatty    infiltration. This decreases sensitivity for detection of focal hypodense    lesions. Within this limitation, there are no suspicious focal hepatic  lesions.    Mild diffuse central biliary ductal prominence and extrahepatic biliary ductal    dilation, favored to represent reservoir function status post cholecystectomy.        SPLEEN: The spleen is enlarged measuring 16.1 cm in AP diameter        PANCREAS: There is no pancreatic ductal dilatation or peripancreatic    inflammation.        ADRENALS: There is no adrenal nodule.        KIDNEYS/URETERS: No hydronephrosis, stones, or solid mass  lesions.        PELVIC ORGANS/BLADDER: The uterus is nonvisualized, likely surgically absent.Marland Kitchen        PERITONEUM / RETROPERITONEUM: There is no free intraperitoneal gas or fluid.        LYMPH NODES: There  are no enlarged lymph nodes in the abdomen or pelvis.        VESSELS: Moderate atherosclerotic vascular calcification without evidence of    aneurysm.        GI TRACT: There are postsurgical changes status post gastric bypass procedure    with tiny hiatal hernia. No evidence of obstruction or wall thickening.  Segments    of the colon are collapsed and suboptimally assessed. Scattered diverticulosis    without evidence of diverticulitis. Focal narrowing of the mid transverse  colon,    favored to be transient finding.        BONES AND SOFT TISSUES: Heterogeneous osseous demineralization. Multilevel    degenerative changes of the imaged thoracolumbar spine. Postsurgical changes  of    the lower lumbar spine. Degenerative changes of the hips bilaterally.    Nonspecific edema within the superficial soft tissues of the back. Small    fat-containing periumbilical hernia. Healed right posterior eighth and ninth  rib    fractures, unchanged. Rate 1 anterolisthesis of L4 on L5.        IMPRESSION:        1\. No acute findings within the abdomen or pelvis.        2\. Marked fatty liver.        3\. Splenomegaly.        4\. Partially imaged lung bases demonstrate interval improvement in bilateral    lower lobe consolidation with increased upper lobe groundglass opacities.    Dedicated chest CT should be considered for further evaluation.        Julius Bowels MD 07/26/2016 1:39 PM        Signature Line    ***** Final Report*****    Signed by: Hildred Laser MD, Joni Reining 07/26/16 1:39 pm            CT Abdomen and Pelvis C+    This document has an image        [1]    Impression and Plan    Chronic back pain    67 y/o with PMH of COPD, hypertension, HFrEF, Bipolar, recurrent falls, and  concern for substance abuse in the past who presents to ED    with weakness and hypotension found to have AKI.        Problems:    1\. Hypotension -Shock which resulted in acute renal failure as well as shock  liver resolved possibly related to medication  induced versus hypoperfusion of  hypertension    ID following: 1\. Gram negative rod bacteremia and possible sepsis 2\.  Hypotension - resolved; unclear if related to polypharmacy vs. gram negative  rod sepsis    -continue zosyn for now -await blood culture speciation    -slow growth may indicate only small amounts of bacteria which could support translocation during her episode of hypotension, rather than a deep seated focus of    infection with heavy burden of organisms. Coupled with her lack of symptoms,  this is reassuring. She may be able to have a shortened course of therapy    (7-10 days rather than the usual 14 for GNR bacteremia).    -trending LFTs and await MRCP to investigate cause of these abnormalities  2\. AKI likely pre-renal 2/2 hypotension.Resolved        3\. Cholestatic jaundice versus obstructive jaundice,    abdominal ultrasound did not show any evidence of common bile duct obstruction    With a gram-negative bacteremia, it is reasonable to do a CT scan of the  abdomen, And will consult infectious disease    Appreciate GI input:sec to persistent elevated LFTS and elevated bili, rec  MRCP today to r/o bile duct obstruction    And if there is no evidence of common bile duct obstruction that possibly a GI  source him to be the likely explanation for the patient's gram-negative  bacteremia        4\. hypernatremia: To hypovolemia -resolved    follow sodium levels        5\. Anemia.Due to sepsis as well as anemia of chronic disease    follow cbc        6\. Bipolar.: Possibly contributing to the patient's clinical condition will  continue to monitor that        7\. Recurrent falls.: In the setting of hypotension and hypoperfusion  anticipate resolve with correction of the above    PT eval        8\. COPD not in exacerbation.        prophylaxis: heparin sc [2]    Fall    Fall    Hypokalemia    Hypotension    Hypotension    Weakness    Weakness    [1] CT Abdomen and Pelvis C+; Hildred Laser MD, Joni Reining  07/26/2016 11:53 EST    [2] Progress/SOAP Note; Irving Burton MD-HOSP, Mohammed 07/26/2016 11:32 EST    SIGNATURE LINE Electronically signed by Brynda Peon MD, Seema on 08/21/2016 at  12:48:48 EST

## 2016-07-21 NOTE — Progress Notes (Signed)
Subjective    "That Dilaudid did nothing" Pain 8/10. S/P Cholangiogram, had new tube placed.  Later in day had PO Oxycodone and was sleeping no appearance of discomfort.  Dilaudid DC'd. Psych NP input appreciated, patient refused to decrease any of  her medications, "I have been taking for over 15 yrs."    Patient examined, chart reviewed, and discussed plan of care with healthcare  team.    Review of Systems    All systems have been reviewed and are negative unless identified in the HPI.    Objective    Vitals & Measurements    **T:** 97.8 F (Oral) **TMIN:** 97.3 F (Oral) **TMAX:** 99.5 F (Oral)  **HR:** 69 **RR:** 16 **BP:** 126/77 **SpO2:** 96% **O2 Rate:** 2 **O2 Method  (L/min):** Room air    Physical Exam    General: Mild distress, asking appropriate questions, lying in bed, less pain  and no nausea.    Neuro: A & O X 3. No focal deficits. Calm, following commands    Pulmonary: CTAB, no cough or wheeze. Non-labored respirations on Room Air.    Cardiac: Reg rate. No chest pain or shortness of breath. Bil LE edema    GI/GU: +BS, abd soft, Tender at tube insertion site. Distension. BM 1/31, soft  solid. U/O: Voiding in BR    Skin: warm and dry. No rash    Musculoskeletal: MAE, Independent ambulation , working with PT    Medications    _Inpatient_    albuterol 2.5 mg/3 mL (0.083%) inhalation solution, 2.5 mg= 3 mL, INHAL, q4hr,  PRN    amitriptyline, 50 mg= 1 tab(s), PO, HS    budesonide 0.5 mg/2 mL inhalation suspension, 0.5 mg= 2 mL, NEB, BID    clonazePAM, 1 mg= 1 tab(s), PO, BID    Cymbalta, 60 mg= 2 cap(s), PO, Daily    Dextrose 50% For Protocol, per protocol, IV Push, ud, PRN    Dilaudid, 0.5 mg= 0.5 mL, IV Push, q6hr, PRN    formoterol 20 mcg/2 mL inhalation solution, 20 mcg= 2 mL, NEB, BID    glucagon, 1 mg= 1 EA, IM, ud, PRN    glucose 40% oral gel, 37.5 Gm= 1 EA, PO, ud, PRN    HumuLIN R Low, 0-12 Unit(s), sc, QIDACHS    Latuda, 120 mg= 6 tab(s), PO, HS    Metoprolol Tartrate (Lopressor), 50  mg= 1 tab(s), PO, BID    MiraLax oral powder for reconstitution, 17 Gm= 1 EA, PO, Daily, PRN    ondansetron, 4 mg= 2 mL, IV Push, q8hr, PRN    oxyCODONE, 10 mg= 2 tab(s), PO, TID, PRN    Protonix, 40 mg= 1 tab(s), PO, ACB    Reglan, 10 mg= 2 mL, IV Push, q6hr, PRN    Spiriva, 18 mcg= 1 cap(s), INHAL, Daily    Zosyn    _Home_    amitriptyline, 50 mg, PO, HS    amLODIPine, 10 mg, PO, Daily    atenolol, 50 mg, PO, BID    Breo Ellipta 100 mcg-25 mcg/inh inhalation powder, 1 puff(s), INHAL, Daily    clonazePAM 1 mg oral tablet, 1 mg, PO, BID    Cymbalta 60 mg oral delayed release capsule, 60 mg= 1 cap(s), PO, Daily    ferrous gluconate 325 mg (36 mg elemental iron) oral tablet, 325 mg= 1 tab(s),  PO, Daily    furosemide 40 mg oral tablet, See Instructions    furosemide 40 mg oral tablet, 40 mg= 1 tab(s),  PO, Daily    Latuda, 120 mg, PO, HS    lisinopril, 40 mg, PO, Daily    omeprazole, 20 mg, PO, BID    oxyCODONE 10 mg oral tablet, 10 mg= 1 tab(s), PO, BID    Spiriva 18 mcg inhalation capsule, 18 mcg= 1 cap(s), INHAL, Daily    tiZANidine 4 mg oral tablet, 8 mg= 2 tab(s), PO, Daily    tiZANidine 4 mg oral tablet, 4 mg= 1 tab(s), PO, BID    Vitamin B12 250 mcg oral tablet, 250 mcg= 1 tab(s), PO, Daily    Vitamin D3, 1000 Unit(s), PO, Daily    Lab Results    Blood Glucose POC: 106 mg/dL (09/32/35 57:32:20)    Blood Glucose Testing Reason: Routine capillary blood sugar (08/05/16  11:36:25)    Glucose Lvl: 88 mg/dL (25/42/70 62:37:62)    BUN: 3 mg/dL Low (83/15/17 61:60:73)    Creatinine: 0.779 mg/dL (71/06/26 94:85:46)    Afn Amer Glomerular Filtration Rate: >90 (08/05/16 08:02:45)    Non-Afn Amer Glomerular Filtration Rate: 81 ml/min/1.43m2 (08/05/16 08:02:43)    Sodium Lvl: 139 mmol/L (08/05/16 08:02:41)    Potassium Lvl: 3.6 mmol/L (08/05/16 08:02:41)    Chloride: 108 mmol/L (08/05/16 08:02:41)    CO2: 26 mmol/L (08/05/16 08:02:41)    Anion Gap: 5 (08/05/16 08:02:41)    Total Protein: 6.2 Gm/dL (27/03/50 09:38:18)     Albumin Lvl: 2 Gm/dL Low (29/93/71 69:67:89)    Calcium Lvl: 8 mg/dL Low (38/10/17 51:02:58)    Magnesium Lvl: 2.1 mg/dL (52/77/82 42:35:36)    Bilirubin Total: 1.5 mg/dL High (14/43/15 40:08:67)    Bilirubin Direct: 1.1 mg/dL High (61/95/09 32:67:12)    Alkaline Phosphatase: 248 Units/L High (08/05/16 08:02:42)    AST: 21 Units/L (08/05/16 08:02:42)    ALT: 21 Units/L (08/05/16 08:02:42)    WBC: 9.2 thous/mm3 (08/05/16 07:53:29)    RBC: 2.97 Mil/mm3 Low (08/05/16 07:53:29)    Hgb: 9.9 Gm/dL Low (45/80/99 83:38:25)    Hct: 31.7 % Low (08/05/16 07:53:29)    Platelet: 493 thous/mm3 High (08/05/16 07:53:29)    MCV: 107 fL High (08/05/16 07:53:29)    MCH: 33 pGm High (08/05/16 07:53:29)    MCHC: 31.2 Gm/dL Low (05/39/76 73:41:93)    RDW: 17.2 % High (08/05/16 07:53:29)    MPV: 9.1 fL (08/05/16 07:53:29)    NRBC Percent: 0 % (08/05/16 07:53:29)    Diagnostic Results    AB CT - 08/03/2016    FINDINGS:        BONES: There is scoliosis noted along with multilevel lumbar degenerative  change    including severe facet degeneration with grade 2 anterolisthesis at L4-5.  There    is fusion of the vertebral bodies at T9-10 and there is mild anterior wedge    vertebral compression deformity at T12, unchanged. There are multiple healed  rib    fractures on the RIGHT. There are NO suspicious osseous findings.        LOWER CHEST: There is mild dependent atelectasis. There is a small RIGHT  pleural    effusion.        PERITONEUM: There is no pneumatosis, mesenteric venous gas or free air. There  is    no free fluid or organized extraluminal fluid collection. There is no    significant stranding in the peritoneal fat.        GI TRACT: Colonic diverticula are noted with no evidence of diverticulitis.    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  There is no abnormal bowel    distention or wall thickening. The appendix is not visualized.        HEPATOBILIARY: There is a percutaneous biliary drainage catheter entering from    the  RIGHT. The liver measures only 17 Hounsfield units on this postcontrast    examination, compatible with severe hepatic steatosis. The gallbladder is    surgically absent. The hepatobiliary structures appear otherwise unremarkable    for technique.        PANCREAS: The pancreas appears within normal limits for technique.        SPLEEN: The spleen is enlarged measuring 15.5 cm in length. No focal splenic    lesions are seen.        ADRENALS: The adrenal glands appear within normal limits.        GU: The kidneys enhance symmetrically and appear within normal limits for    technique with no contour deforming solid masses or hydronephrosis.        VASCULAR: There is mild scattered atherosclerotic disease. There is no  abdominal    aortic aneurysm. The portal vein, splenic vein and main mesenteric veins are    patent.        LYMPH NODES: There are scattered shotty lymph nodes with no adenopathy by size    criteria.        PELVIS: Prior total abdominal hysterectomy with BILATERAL salpingo-  oophorectomy.        ____________________________________________        IMPRESSION:        1\. Indwelling percutaneous biliary drainage catheter. Severe hepatic  steatosis.    No bile duct dilatation appreciated. Prior cholecystectomy.        2\. Mild splenomegaly.        3\. Small but enlarging RIGHT pleural effusion with thin pleural enhancement.    Please correlate clinically.        4\. Colonic diverticulosis and evidence of prior gastric bypass surgery.        5\. There is otherwise NO evidence of abscess.    ____________________________________________        IR Cholangiogram Injection - 08/05/2016        INDINGS: Contrast opacifies a decompressed intrahepatic biliary system as the    catheter has pulled back into a right lobe radical. There is obstruction at  the    level of the ampulla.        INTERVENTION: Over a 0.035 inch guidewire, the indwelling tube was exchanged  for    a 7 [place] long vascular sheath which was advanced  over a 0.035 inch guidewire    into the distal common duct. Contrast injection was performed and again  revealed    obstruction at the level of the ampulla. No fixed filling defect is identified    to suggest residual stone.        Through the 7 French sheath, under direct fluoroscopic guidance, I advanced an    endoscopic brush and took brushings of the ampulla. The brush was removed        Contrast injection confirms tube patency. The catheter    was sutured into place and an occlusive dressing applied. It was replaced to    gravity drainage.        IMPRESSION:    1\. Biliary drain injection confirms malpositioning of the drain having been    pulled back into a right hepatic biliary radicle. 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    2\. Brush biopsy of ampulla.    3\. Replacement of 8 French internal/external biliary drain as described.        [1]        IMPRESSION and PLAN:    Chronic back pain        Falls        Hypokalemia        Hypotension        Weakness            66 y/o with PMH of COPD, hypertension, HFrEF, Bipolar, recurrent falls, and  concern for substance abuse in the past who presents to ED    with weakness and hypotension found to have AKI.            #Acute Renal Injury/Acute Liver Failure secondary to Hypotension and Shock  with underlying Sepsis/hypoperfusion - Resolved    - Continue to monitor BMP        #Sepsis due to cholangitis / choledocholithiasis 14m -Resolved    -with bacteremia due to klebsiella, biliary course    -appreciate ID consult, given nausea Re-start IV zosyn - DC Cipro and Flagyl    -patient had MRCP with successful stone removal with IR    -drain in place, internal drainage scheduled for when output decreases and bilirubin levels are normal    -follow up cholangiogram 2/1- obstruction of the Ampulla, dilated and obstruction resoved.     - PO Oxycodone TID, PRN        #Vaginitis: Having increased discomfort in peri area and labia areas - No  further  discomfort    - PO Fluconazole given x 1 150mg  for possible yeast infection on 1/30        #Acute electrolyte derangement - Potassium 3.6    - Replete as needed    -ctm        #Anemia - no apparent blood loss. H & H= 9.9/31.7    -acute on chronic    -Transfused 2 units of prbc's on 1/24    -Continue to monitor H & H        #Bipolar - Stable    -On Cymbalta and Amitriptyline    -Qtc on 1/17 = 511, Repeat ECG Qtc 455 on 2/01    - Psych input appreciated - No changes in medications per patient request        #Recurrent falls    -will work with PT for discharging planning    -she is deferring rehab        #COPD -stable    -Continue bronchodilators    -Wears Oxygen PRN        PPX: DVT: Sequential boots when resting in bed, Early ambulation    GI: Pantoprazole    Dispo: Ongoing treatment of Bacteremia and cholangitis with IV Zosyn and  cholangio tube replaced today. Not able to have tube internalized until  bilirubin normalizes - not sure if will be this admission. Cholangiogram  revealed obstruction of the Ampulla, dilated and obstruction relieved. Less  pain and nausea. Refusing rehab, will continue to assess discharge needs. COC  involved.    SIGNATURE LINE Electronically signed by Brynda Peon MD, Seema on 08/06/2016 at  06:32:59 EST

## 2016-07-21 NOTE — Progress Notes (Signed)
Subjective    pt seen and examined. nad noted awaiting for ERCP today. denies pain,  consented to blood transfusion    Review of Systems    All systems have been reviewed and are negative other than what was stated in  the HPI.    Objective    Vitals & Measurements    **T:** 98.9 F (Oral) **TMIN:** 97.4 F (Oral) **TMAX:** 99.5 F (Oral)  **RR:** 18 **BP:** 136/81 **SpO2:** 100% **O2 Rate:** 1 **O2 Method (L/min):**  Nasal cannula    Physical Exam    GEN: alert, oriented x 3, NAD, obese, jaundiced in appearance    SKIN: no diaphoresis, clean, dry and intact.    HEENT: NC, atraumatic, PERRLA, MM moist, no pharyngeal edema, tongue midline.    NECK: supple, no JVD    CV: RRR, no murmurs,rubs or gallops    CHEST: ls cta, non labored resp, equal air movement    ABD: soft, nt, protuberant, +bsx4    GU: no cva tenderness.    EXT: Warm, well perfused, no clubbing, no cyanosis, no edema    NEURO: alert, oriented x 3, nonfocal exam    PSYCH: normal mood.    Medications    _Inpatient_    albuterol 2.5 mg/3 mL (0.083%) inhalation solution, 2.5 mg= 3 mL, INHAL, q4hr,  PRN    amitriptyline, 50 mg= 1 tab(s), PO, HS    budesonide 0.5 mg/2 mL inhalation suspension, 0.5 mg= 2 mL, NEB, BID    clonazePAM, 1 mg= 1 tab(s), PO, BID    Cymbalta, 60 mg= 2 cap(s), PO, Daily    Dextrose 50% For Protocol, per protocol, IV Push, ud, PRN    formoterol 20 mcg/2 mL inhalation solution, 20 mcg= 2 mL, NEB, BID    glucagon, 1 mg= 1 EA, IM, ud, PRN    glucose 40% oral gel, 37.5 Gm= 1 EA, PO, ud, PRN    HumuLIN R Low, 0-12 Unit(s), sc, QIDACHS    indomethacin, 100 mg= 2 supp, PR, Once    Latuda, 120 mg= 6 tab(s), PO, HS    ondansetron, 4 mg= 2 mL, IV Push, q8hr, PRN    oxyCODONE, 5 mg= 1 tab(s), PO, q6hr, PRN    Protonix, 40 mg= 1 tab(s), PO, ACB    Sodium Chloride 0.9% 250 mL, 250 mL, IV    Spiriva, 18 mcg= 1 cap(s), INHAL, Daily    Zosyn    _Home_    amitriptyline, 50 mg, PO, HS    amLODIPine, 10 mg, PO, Daily    atenolol, 50 mg, PO,  BID    Breo Ellipta 100 mcg-25 mcg/inh inhalation powder, 1 puff(s), INHAL, Daily    clonazePAM 1 mg oral tablet, 1 mg, PO, BID    Cymbalta 60 mg oral delayed release capsule, 60 mg= 1 cap(s), PO, Daily    ferrous gluconate 325 mg (36 mg elemental iron) oral tablet, 325 mg= 1 tab(s),  PO, Daily    furosemide 40 mg oral tablet, See Instructions    furosemide 40 mg oral tablet, 40 mg= 1 tab(s), PO, Daily    Latuda, 120 mg, PO, HS    lisinopril, 40 mg, PO, Daily    omeprazole, 20 mg, PO, BID    oxyCODONE 10 mg oral tablet, 10 mg= 1 tab(s), PO, BID    Spiriva 18 mcg inhalation capsule, 18 mcg= 1 cap(s), INHAL, Daily    tiZANidine 4 mg oral tablet, 8 mg= 2 tab(s), PO, Daily    tiZANidine 4  mg oral tablet, 4 mg= 1 tab(s), PO, BID    Vitamin B12 250 mcg oral tablet, 250 mcg= 1 tab(s), PO, Daily    Vitamin D3, 1000 Unit(s), PO, Daily    Lab Results    Blood Glucose POC: 97 mg/dL (44/01/02 72:53:66)    Blood Glucose Testing Reason: Routine capillary blood sugar (07/28/16  11:50:05)    Glucose Lvl: 73 mg/dL (44/03/47 42:59:56)    BUN: 5 mg/dL Low (38/75/64 33:29:51)    Creatinine: 0.666 mg/dL (88/41/66 06:30:16)    Afn Amer Glomerular Filtration Rate: >90 (07/28/16 08:14:26)    Non-Afn Amer Glomerular Filtration Rate: >90 (07/28/16 08:14:28)    Sodium Lvl: 145 mmol/L (07/28/16 08:14:24)    Potassium Lvl: 3.6 mmol/L (07/28/16 08:14:24)    Chloride: 116 mmol/L High (07/28/16 08:14:24)    CO2: 21 mmol/L (07/28/16 08:14:24)    Anion Gap: 8 (07/28/16 08:14:24)    Total Protein: 5 Gm/dL Low (07/13/30 35:57:32)    Albumin Lvl: 1.5 Gm/dL Low (20/25/42 70:62:37)    Calcium Lvl: 7.6 mg/dL Low (62/83/15 17:61:60)    Magnesium Lvl: 2 mg/dL (73/71/06 26:94:85)    Bilirubin Total: 2.6 mg/dL High (46/27/03 50:09:38)    Bilirubin Direct: 2 mg/dL High (18/29/93 71:69:67)    Alkaline Phosphatase: 388 Units/L High (07/28/16 08:14:25)    AST: 90 Units/L High (07/28/16 08:14:25)    ALT: 106 Units/L High (07/28/16 08:14:25)    WBC: 4.5 thous/mm3  (07/28/16 07:44:36)    RBC: 1.86 Mil/mm3 Low (07/28/16 07:44:36)    Hgb: 6.4 Gm/dL Critical (89/38/10 17:51:02)    Hct: 20.3 % Critical (07/28/16 07:44:36)    Platelet: 222 thous/mm3 (07/28/16 07:44:36)    MCV: 109 fL High (07/28/16 07:44:36)    MCH: 34 pGm High (07/28/16 07:44:36)    MCHC: 31.5 Gm/dL Low (58/52/77 82:42:35)    RDW: 13.5 % (07/28/16 07:44:36)    MPV: 9.8 fL (07/28/16 07:44:36)    NRBC Percent: 0 % (07/28/16 07:44:36)    ABORh: A POS (07/28/16 09:35:32)    Antibody Screen: Negative (07/28/16 10:01:10)    Electronic Crossmatch: Computer XM OK (07/28/16 10:03:09)    Electronic Crossmatch: Computer XM OK (07/28/16 10:03:09)        xam    abnormal lfts, bilirubin;Jaundice        FINAL REPORT    REPORT: SITE READ:5        Procedure: MRI Cholangiopancreatography 07/27/2016 5:08 PM    Indications: Jaundice - abnormal LFTs, bilirubin    Comparison: Abdominal CT dated 07/26/2016        Technique: Multiplanar multisequence MRI of the abdomen obtained without IV    administration of contrast. MRCP images were obtained.        FINDINGS:    Patient motion artifact mildly degrades several sequences. Within this  confine:        In the included lower thorax, there appears to be right middle lobe airspace    disease.        There is marked diffuse loss of hepatic signal on the out of phase images,    consistent with hepatic steatosis. No suspicious hepatic lesions are seen.    Multiple scattered tiny hepatic cysts are noted. There is moderate to severe    intrahepatic biliary ductal dilatation. The CBD is dilated measuring up to 14    mm. There is a 13 x 9 mm hypointense filling defect in the distal CBD  consistent    with choledocholithiasis (series 3, image 12 and series 4,  image 10).        The gallbladder is surgically absent.        The pancreatic duct is not dilated. The pancreas, adrenal glands and spleen  are    unremarkable. There is no hydronephrosis. The kidneys appear normal.        There is no ascites. No  dilated loops of bowel are seen. There is no    retroperitoneal lymphadenopathy. The abdominal aorta is not dilated.        Degenerative changes of the visualized spine are noted.        IMPRESSION:    1\. Moderate-severe intra- and extra-hepatic biliary ductal dilatation, with  13    mm filling defect in the distal CBD consistent with choledocholithiasis.    2\. Significant hepatic steatosis.    3\. Right middle lobe airspace disease.        Leanord Asal MD 07/27/2016 6:49 PM        Signature Line    ***** Final Report*****    Signed by: Domenica Fail MD, [place] 07/27/16 6:49 pm            MRI Cholangiopancreatography    This document has an image        [1]    Diagnostic Results    Impression and Plan    Chronic back pain    66 y/o with PMH of COPD, hypertension, HFrEF, Bipolar, recurrent falls, and  concern for substance abuse in the past who presents to ED    with weakness and hypotension found to have AKI.        Problems:    1\. Hypotension -Shock which resulted in acute renal failure as well as shock  liver resolved possibly related to medication induced versus hypoperfusion of  hypertension    ID following: 1\. Gram negative rod bacteremia and possible sepsis 2.  Hypotension - resolved; unclear if related to polypharmacy vs. gram negative  rod sepsis    -continue zosyn for now -await blood culture negx2    (7-10 days rather than the usual 14 for GNR bacteremia).    -trend LFTs    -MRCP: showing IMPRESSION:    1\. Moderate-severe intra- and extra-hepatic biliary ductal dilatation, with  13    mm filling defect in the distal CBD consistent with choledocholithiasis and  Significant hepatic steatosis.    plan for ERCP today        2\. AKI likely pre-renal 2/2 hypotension.Resolved        3\. Cholestatic jaundice versus obstructive jaundice,    abdominal ultrasound did not show any evidence of common bile duct obstruction    With a gram-negative bacteremia, it is reasonable to do a CT scan of the  abdomen, And will  consult infectious disease    Appreciate GI input:sec to persistent elevated LFTS and elevated bili, rec  MRCP showing per above    npo until after ERCP        4\. hypernatremia: To hypovolemia -resolved    follow sodium levels        5\. Anemia.Due to sepsis as well as anemia of chronic disease    2 units of prbc's    await GI final eval for worsening anemia        6\. Bipolar.: Possibly contributing to the patient's clinical condition will  continue to monitor that        7\. Recurrent falls.: In the setting of hypotension and hypoperfusion  anticipate resolve with correction of the  above    PT eval        8\. COPD not in exacerbation.        prophylaxis: heparin sc [2]    Fall    Fall    Hypokalemia    Hypotension    Hypotension    Weakness    Weakness    [1] MRI Cholangiopancreatography; Domenica Fail MD, [place] 07/27/2016 17:22 EST    [2] Progress/SOAP Note; Lorin Picket NP, Xane Amsden 07/27/2016 14:49 EST    SIGNATURE LINE Electronically signed by Brynda Peon MD, [place] on 08/21/2016 at  12:48:48 EST

## 2016-07-21 NOTE — Progress Notes (Signed)
Subjective    examined this morning    awaiting IR    interm discharge summary completed....        discharge in am    Review of Systems    Objective    Vitals & Measurements    **T:** 97.3 F (Oral) **TMIN:** 97 F (Oral) **TMAX:** 97.6 F (Oral) **RR:**  16 **BP:** 144/78 **SpO2:** 100% **O2 Method (L/min):** Room air **WT:** 86.6  Kg    Physical Exam    Medications    _Inpatient_    albuterol 2.5 mg/3 mL (0.083%) inhalation solution, 2.5 mg= 3 mL, INHAL, q4hr,  PRN    amitriptyline, 50 mg= 1 tab(s), PO, HS    budesonide 0.5 mg/2 mL inhalation suspension, 0.5 mg= 2 mL, NEB, BID    clonazePAM, 1 mg= 1 tab(s), PO, BID    Cymbalta, 60 mg= 2 cap(s), PO, Daily    Dextrose 50% For Protocol, per protocol, IV Push, ud, PRN    formoterol 20 mcg/2 mL inhalation solution, 20 mcg= 2 mL, NEB, BID    glucagon, 1 mg= 1 EA, IM, ud, PRN    glucose 40% oral gel, 37.5 Gm= 1 EA, PO, ud, PRN    HumuLIN R Low, 0-12 Unit(s), sc, QIDACHS    lactobacillus rhamnosus GG, 1 cap(s), PO, Daily    Latuda, 120 mg= 6 tab(s), PO, HS    Metoprolol Tartrate (Lopressor), 50 mg= 1 tab(s), PO, BID    MiraLax oral powder for reconstitution, 17 Gm= 1 EA, PO, Daily, PRN    ondansetron, 4 mg= 2 mL, IV Push, q8hr, PRN    oxyCODONE, 10 mg= 2 tab(s), PO, TID, PRN    Protonix, 40 mg= 1 tab(s), PO, ACB    Reglan, 10 mg= 2 mL, IV Push, q6hr, PRN    Spiriva, 18 mcg= 1 cap(s), INHAL, Daily    Zosyn    _Home_    amitriptyline, 50 mg, PO, HS    amLODIPine, 10 mg, PO, Daily    atenolol, 50 mg, PO, BID    Breo Ellipta 100 mcg-25 mcg/inh inhalation powder, 1 puff(s), INHAL, Daily    clonazePAM 1 mg oral tablet, 1 mg, PO, BID    Cymbalta 60 mg oral delayed release capsule, 60 mg= 1 cap(s), PO, Daily    ferrous gluconate 325 mg (36 mg elemental iron) oral tablet, 325 mg= 1 tab(s),  PO, Daily    furosemide 40 mg oral tablet, See Instructions    furosemide 40 mg oral tablet, 40 mg= 1 tab(s), PO, Daily    Latuda, 120 mg, PO, HS    lisinopril, 40 mg, PO,  Daily    omeprazole, 20 mg, PO, BID    oxyCODONE 10 mg oral tablet, 10 mg= 1 tab(s), PO, BID    Spiriva 18 mcg inhalation capsule, 18 mcg= 1 cap(s), INHAL, Daily    tiZANidine 4 mg oral tablet, 8 mg= 2 tab(s), PO, Daily    tiZANidine 4 mg oral tablet, 4 mg= 1 tab(s), PO, BID    Vitamin B12 250 mcg oral tablet, 250 mcg= 1 tab(s), PO, Daily    Vitamin D3, 1000 Unit(s), PO, Daily    Lab Results    Blood Glucose POC: 94 mg/dL (16/10/96 04:54:09)    Blood Glucose Testing Reason: Routine capillary blood sugar (08/10/16  22:48:40)    Glucose Lvl: 75 mg/dL (81/19/14 78:29:56)    BUN: 5 mg/dL Low (21/30/86 57:84:69)    Creatinine: 0.702 mg/dL (62/95/28 41:32:44)    Afn Denyse Dago  Glomerular Filtration Rate: >90 (08/10/16 07:41:20)    Non-Afn Amer Glomerular Filtration Rate: >90 (08/10/16 07:41:22)    Sodium Lvl: 145 mmol/L (08/10/16 07:41:18)    Potassium Lvl: 3.7 mmol/L (08/10/16 07:41:18)    Chloride: 114 mmol/L High (08/10/16 07:41:18)    CO2: 22 mmol/L (08/10/16 07:41:18)    Anion Gap: 9 (08/10/16 07:41:18)    Total Protein: 5.5 Gm/dL Low (27/25/36 64:40:34)    Albumin Lvl: 1.7 Gm/dL Low (74/25/95 63:87:56)    Calcium Lvl: 7.8 mg/dL Low (43/32/95 18:84:16)    Magnesium Lvl: 2.2 mg/dL (60/63/01 60:10:93)    Bilirubin Total: 0.9 mg/dL (23/55/73 22:02:54)    Bilirubin Direct: 0.5 mg/dL High (27/06/23 76:28:31)    Alkaline Phosphatase: 170 Units/L High (08/10/16 07:41:19)    AST: 21 Units/L (08/10/16 07:41:19)    ALT: 15 Units/L (08/10/16 07:41:19)    WBC: 6.6 thous/mm3 (08/10/16 07:21:04)    RBC: 2.62 Mil/mm3 Low (08/10/16 07:21:04)    Hgb: 8.8 Gm/dL Low (51/76/16 07:37:10)    Hct: 27.4 % Low (08/10/16 07:21:04)    Platelet: 395 thous/mm3 (08/10/16 07:21:04)    MCV: 105 fL High (08/10/16 07:21:04)    MCH: 34 pGm High (08/10/16 07:21:04)    MCHC: 32.1 Gm/dL (62/69/48 54:62:70)    RDW: 17.2 % High (08/10/16 07:21:04)    MPV: 9.2 fL (08/10/16 07:21:04)    NRBC Percent: 0 % (08/10/16 07:21:04)          Diagnostic  Results    Impression and Plan    Chronic back pain    Fall    Fall    Hypokalemia    Hypotension    Hypotension    Weakness    Weakness    SIGNATURE LINE Electronically signed by San Jetty, Altamese Deguire on 08/10/2016  at 23:57:37 EST

## 2016-07-21 NOTE — Progress Notes (Signed)
Subjective    I didn't sleep well last night." Pain 8/10. Sleeping most of morning    Patient examined, chart reviewed, and discussed plan of care with healthcare  team.    Review of Systems    All systems have been reviewed and are negative unless identified in the HPI.    Objective    Vitals & Measurements    **T:** 97.9 F (Oral) **TMIN:** 97.9 F (Oral) **TMAX:** 99.3 F (Oral)  **RR:** 18 **BP:** 140/80 **SpO2:** 95% **O2 Method (L/min):** Room air  **WT:** 96.5 Kg    Physical Exam    General: NAD, asking appropriate questions, states she gets nervous when she  can't breath.    Neuro: A & O X 3. No focal deficits. Calm, following commands    Pulmonary: CTAB, no cough or wheeze. Non-labored respirations on Room Air.    Cardiac: Reg rate. No chest pain or shortness of breath. Bil LE edema    GI/GU: +BS, abd soft, Tender at tube insertion site. Non-distended. BM x 1  U/O: Voiding in BR    Skin: warm and dry. No rash    Musculoskeletal: MAE, Independent ambulation  [1]    Medications    _Inpatient_    albuterol 2.5 mg/3 mL (0.083%) inhalation solution, 2.5 mg= 3 mL, INHAL, q4hr,  PRN    amitriptyline, 50 mg= 1 tab(s), PO, HS    budesonide 0.5 mg/2 mL inhalation suspension, 0.5 mg= 2 mL, NEB, BID    Cipro, 500 mg= 2 tab(s), PO, BIDAC    clonazePAM, 1 mg= 1 tab(s), PO, BID    Cymbalta, 60 mg= 2 cap(s), PO, Daily    Dextrose 50% For Protocol, per protocol, IV Push, ud, PRN    Flagyl, 500 mg= 2 tab(s), PO, q8hr    formoterol 20 mcg/2 mL inhalation solution, 20 mcg= 2 mL, NEB, BID    glucagon, 1 mg= 1 EA, IM, ud, PRN    glucose 40% oral gel, 37.5 Gm= 1 EA, PO, ud, PRN    HumuLIN R Low, 0-12 Unit(s), sc, QIDACHS    indomethacin, 100 mg= 2 supp, PR, Once    Latuda, 120 mg= 6 tab(s), PO, HS    Metoprolol Tartrate (Lopressor), 25 mg= 1 tab(s), PO, BID    ondansetron, 4 mg= 2 mL, IV Push, q8hr, PRN    oxyCODONE, 10 mg= 2 tab(s), PO, TID, PRN    Protonix, 40 mg= 1 tab(s), PO, ACB    Reglan, 10 mg= 2 mL, IV Push, q6hr,  PRN    Spiriva, 18 mcg= 1 cap(s), INHAL, Daily    _Home_    amitriptyline, 50 mg, PO, HS    amLODIPine, 10 mg, PO, Daily    atenolol, 50 mg, PO, BID    Breo Ellipta 100 mcg-25 mcg/inh inhalation powder, 1 puff(s), INHAL, Daily    clonazePAM 1 mg oral tablet, 1 mg, PO, BID    Cymbalta 60 mg oral delayed release capsule, 60 mg= 1 cap(s), PO, Daily    ferrous gluconate 325 mg (36 mg elemental iron) oral tablet, 325 mg= 1 tab(s),  PO, Daily    furosemide 40 mg oral tablet, See Instructions    furosemide 40 mg oral tablet, 40 mg= 1 tab(s), PO, Daily    Latuda, 120 mg, PO, HS    lisinopril, 40 mg, PO, Daily    omeprazole, 20 mg, PO, BID    oxyCODONE 10 mg oral tablet, 10 mg= 1 tab(s), PO, BID    Spiriva 18  mcg inhalation capsule, 18 mcg= 1 cap(s), INHAL, Daily    tiZANidine 4 mg oral tablet, 8 mg= 2 tab(s), PO, Daily    tiZANidine 4 mg oral tablet, 4 mg= 1 tab(s), PO, BID    Vitamin B12 250 mcg oral tablet, 250 mcg= 1 tab(s), PO, Daily    Vitamin D3, 1000 Unit(s), PO, Daily    Lab Results    Blood Glucose POC: 105 mg/dL (16/10/96 04:54:09)    Blood Glucose Testing Reason: Routine capillary blood sugar (08/01/16  11:33:07)    Glucose Lvl: 93 mg/dL (81/19/14 78:29:56)    BUN: 3 mg/dL Low (21/30/86 57:84:69)    Creatinine: 0.595 mg/dL (62/95/28 41:32:44)    Afn Amer Glomerular Filtration Rate: >90 (08/01/16 08:37:08)    Non-Afn Amer Glomerular Filtration Rate: >90 (08/01/16 08:37:10)    Sodium Lvl: 144 mmol/L (08/01/16 08:37:06)    Potassium Lvl: 3.7 mmol/L (08/01/16 08:37:06)    Chloride: 112 mmol/L High (08/01/16 08:37:06)    CO2: 24 mmol/L (08/01/16 08:37:06)    Anion Gap: 8 (08/01/16 08:37:06)    Total Protein: 5.1 Gm/dL Low (07/06/70 53:66:44)    Albumin Lvl: 1.8 Gm/dL Low (03/47/42 59:56:38)    Calcium Lvl: 7.7 mg/dL Low (75/64/33 29:51:88)    Magnesium Lvl: 1.9 mg/dL (41/66/06 30:16:01)    Bilirubin Total: 1.9 mg/dL High (09/32/35 57:32:20)    Bilirubin Direct: 1.5 mg/dL High (25/42/70 62:37:62)    Alkaline  Phosphatase: 317 Units/L High (08/01/16 08:37:07)    AST: 29 Units/L (08/01/16 08:37:07)    ALT: 44 Units/L (08/01/16 08:37:07)    WBC: 6.7 thous/mm3 (08/01/16 08:03:00)    RBC: 2.63 Mil/mm3 Low (08/01/16 08:03:00)    Hgb: 8.6 Gm/dL Low (83/15/17 61:60:73)    Hct: 26.7 % Low (08/01/16 08:03:00)    Platelet: 269 thous/mm3 (08/01/16 08:03:00)    MCV: 102 fL High (08/01/16 08:03:00)    MCH: 33 pGm High (08/01/16 08:03:00)    MCHC: 32.2 Gm/dL (71/06/26 94:85:46)    RDW: 17.3 % High (08/01/16 08:03:00)    MPV: 9.4 fL (08/01/16 08:03:00)    NRBC Percent: 0 % (08/01/16 08:03:00)        Impression and Plan    Chronic back pain        Falls        Hypokalemia        Hypotension        Weakness            66 y/o with PMH of COPD, hypertension, HFrEF, Bipolar, recurrent falls, and  concern for substance abuse in the past who presents to ED    with weakness and hypotension found to have AKI.            #Acute Renal Injury/Acute Liver Failure secondary to Hypotension and Shock  with underlying Sepsis/hypoperfusion - Resolved    - Continue to monitor BMP        #Sepsis due to cholangitis / choledocholithiasis 76m -Resolved    -with bacteremia due to klebsiella, billary course    -appreciate ID consult, patient will be on PO ABX x 14 days cipro and flagyl    -patient had MRCP with successful stone removal with IR    -drain in place, internal drainage scheduled for when output decreases and bilirubin levels are normal    -patient will need follow up cholangiogram next week        #Acute electrolyte derangement - Potassium 3.7    - Replete as needed    -ctm        #  Anemia - no apparent blood loss. H & H= 8.6/26.7    -acute on chronic    -patient required 2 units of prbc's    -Continue to monitor H & H        #Bipolar - Stable    -continue with home meds        #Recurrent falls    -will work with PT for discharging planning    -she is deferring rehab        #COPD -stable    -Continue bronchodilators    -Wears Oxygen PRN        PPX:  DVT: Sequential boots when resting in bed, Early ambulation    GI: Pantoprazole    Dispo: Ongoing treatment of Bacteremia and cholangitis with PO antibiotics and  cholangio tube draining large amount of bile. Not able to have tube  internalized until bilirubin normalizes - most likely middle of next week.  Refusing rehab, will continue to assess discharge needs. COC involved.    [1] Progress/SOAP Note; Johnathan Hausen NPClaris Che 07/31/2016 14:23 EST    [2] Progress/SOAP Note; Johnathan Hausen NP, Claris Che 07/31/2016 14:23 EST    SIGNATURE LINE Electronically signed by Brynda Peon MD, [place] on 08/02/2016 at  11:14:22 EST

## 2016-07-21 NOTE — Progress Notes (Signed)
Subjective    No acute issues overnight. Denies any pain or nausea.    Review of Systems    Objective    Vitals & Measurements    **T:** 97.4 F (Oral) **TMIN:** 97.4 F (Oral) **TMAX:** 98.6 F (Oral)  **RR:** 18 **BP:** 118/63 **SpO2:** 97% **O2 Method (L/min):** Room air    Physical Exam            General: Alert and oriented, no acute distress    Eye: PERRL, Normal Conjunctiva    HEENT: Normocephalic, no damage to dentition, moist oral mucosa, no pharyngeal  erythema, no sinus tenderness    Neck: supple, nontender, carotid pulse WNL, no carotid bruit, no JVD, no  lymphadenopathy, no thyromegaly, full ROM    Respiratory: Lungs clear to auscultation, respirations non-labored, breath  sounds equal and regular, symmetrical chest expansion, no chest wall  tenderness    Cardiovascular: Normal Rate, normal rhythm, _ beats/minute, no gallop, good  pulses equal in all extremities, normal peripheral perfusion, no edema.    Gastrointestinal: Abdomen soft, non tender, non-distended, normal bowel sounds  all four quadrants, no organomegaly, Drain noted.    Musculoskeletal: normal ROM, normal strength, no tenderness, no swelling, no  deformity, normal gait    Integumentary: Skin intact, warm, pink, dry. No pallor, no rash.    Neurologic: Alert, Oriented, normal sensory, normal motor, no focal deficits.  [1]    Medications    _Inpatient_    albuterol 2.5 mg/3 mL (0.083%) inhalation solution, 2.5 mg= 3 mL, INHAL, q4hr,  PRN    amitriptyline, 50 mg= 1 tab(s), PO, HS    budesonide 0.5 mg/2 mL inhalation suspension, 0.5 mg= 2 mL, NEB, BID    clonazePAM, 1 mg= 1 tab(s), PO, BID    Cymbalta, 60 mg= 2 cap(s), PO, Daily    Dextrose 50% For Protocol, per protocol, IV Push, ud, PRN    formoterol 20 mcg/2 mL inhalation solution, 20 mcg= 2 mL, NEB, BID    glucagon, 1 mg= 1 EA, IM, ud, PRN    glucose 40% oral gel, 37.5 Gm= 1 EA, PO, ud, PRN    HumuLIN R Low, 0-12 Unit(s), sc, QIDACHS    Latuda, 120 mg= 6 tab(s), PO, HS    Metoprolol  Tartrate (Lopressor), 50 mg= 1 tab(s), PO, BID    MiraLax oral powder for reconstitution, 17 Gm= 1 EA, PO, Daily, PRN    ondansetron, 4 mg= 2 mL, IV Push, q8hr, PRN    oxyCODONE, 10 mg= 2 tab(s), PO, TID, PRN    Protonix, 40 mg= 1 tab(s), PO, ACB    Reglan, 10 mg= 2 mL, IV Push, q6hr, PRN    Spiriva, 18 mcg= 1 cap(s), INHAL, Daily    Zosyn    _Home_    amitriptyline, 50 mg, PO, HS    amLODIPine, 10 mg, PO, Daily    atenolol, 50 mg, PO, BID    Breo Ellipta 100 mcg-25 mcg/inh inhalation powder, 1 puff(s), INHAL, Daily    clonazePAM 1 mg oral tablet, 1 mg, PO, BID    Cymbalta 60 mg oral delayed release capsule, 60 mg= 1 cap(s), PO, Daily    ferrous gluconate 325 mg (36 mg elemental iron) oral tablet, 325 mg= 1 tab(s),  PO, Daily    furosemide 40 mg oral tablet, See Instructions    furosemide 40 mg oral tablet, 40 mg= 1 tab(s), PO, Daily    Latuda, 120 mg, PO, HS    lisinopril, 40 mg, PO, Daily  omeprazole, 20 mg, PO, BID    oxyCODONE 10 mg oral tablet, 10 mg= 1 tab(s), PO, BID    Spiriva 18 mcg inhalation capsule, 18 mcg= 1 cap(s), INHAL, Daily    tiZANidine 4 mg oral tablet, 8 mg= 2 tab(s), PO, Daily    tiZANidine 4 mg oral tablet, 4 mg= 1 tab(s), PO, BID    Vitamin B12 250 mcg oral tablet, 250 mcg= 1 tab(s), PO, Daily    Vitamin D3, 1000 Unit(s), PO, Daily    Lab Results    Blood Glucose POC: 92 mg/dL (16/10/96 04:54:09)    Blood Glucose Testing Reason: Routine capillary blood sugar (08/08/16  07:33:02)    Glucose Lvl: 79 mg/dL (81/19/14 78:29:56)    BUN: 5 mg/dL Low (21/30/86 57:84:69)    Creatinine: 0.753 mg/dL (62/95/28 41:32:44)    Afn Amer Glomerular Filtration Rate: >90 (08/08/16 07:38:31)    Non-Afn Amer Glomerular Filtration Rate: 84 ml/min/1.42m2 (08/08/16 07:38:29)    Sodium Lvl: 143 mmol/L (08/08/16 07:38:26)    Potassium Lvl: 3.7 mmol/L (08/08/16 07:38:26)    Chloride: 113 mmol/L High (08/08/16 07:38:26)    CO2: 24 mmol/L (08/08/16 07:38:26)    Anion Gap: 6 (08/08/16 07:38:26)    Total Protein: 5.2  Gm/dL Low (07/06/70 53:66:44)    Albumin Lvl: 1.8 Gm/dL Low (03/47/42 59:56:38)    Calcium Lvl: 7.7 mg/dL Low (75/64/33 29:51:88)    Magnesium Lvl: 2.1 mg/dL (41/66/06 30:16:01)    Bilirubin Total: 0.9 mg/dL (09/32/35 57:32:20)    Bilirubin Direct: 0.7 mg/dL High (25/42/70 62:37:62)    Alkaline Phosphatase: 170 Units/L High (08/08/16 07:38:28)    AST: 17 Units/L (08/08/16 07:38:28)    ALT: 14 Units/L (08/08/16 07:38:28)    WBC: 6.6 thous/mm3 (08/08/16 07:01:26)    RBC: 2.52 Mil/mm3 Low (08/08/16 07:01:26)    Hgb: 8.4 Gm/dL Low (83/15/17 61:60:73)    Hct: 26.5 % Low (08/08/16 07:01:26)    Platelet: 360 thous/mm3 (08/08/16 07:01:26)    MCV: 105 fL High (08/08/16 07:01:26)    MCH: 33 pGm High (08/08/16 07:01:26)    MCHC: 31.7 Gm/dL Low (71/06/26 94:85:46)    RDW: 17.3 % High (08/08/16 07:01:26)    MPV: 8.7 fL (08/08/16 07:01:26)    NRBC Percent: 0 % (08/08/16 07:01:26)          Diagnostic Results    Impression and Plan    66 year female with history of            COPD    Hypertension    Bipolar    Recurrent falls        presented to the ER with complaints of dizziness and weakness . Patient has  been complaining of worsening nausea and had few episodes of    non-bloody vomiting. When the EMS arrived, they found her with blood pressure  of 70/40. On arrival to the Emergency Department, she was hypotensive with  blood pressure 75/41.    with weakness and hypotension found to have AKI.            Acute Renal Injury/Acute Liver Failure secondary to Hypotension and Shock with  underlying Sepsis/hypoperfusion - Resolved        Sepsis due to cholangitis / choledocholithiasis 35m -Resolved. Follow up  cholangiogram 2/1- obstruction of the Ampulla, dilated and obstruction  resolved.    - 2/02 had leaking - IR upsized and stitched with no further leaking.Patient  was noted to have bacteremia due to klebsiella. Continue IV antibiotics( needs  14 days  in total of zosyn)            Bipolar disorder: Stable.        COPD -stable.  Continue bronchodilators        Disposition : rehab versus home with services.    [1] Progress/SOAP Note; Omair Dettmer MD-HOSP, Willeen Cass 08/07/2016 13:15 EST    SIGNATURE LINE Electronically signed by Zane Herald MD-HOSP, Pinkie Manger on 08/08/2016 at  10:36:30 EST

## 2016-07-22 LAB — HX NT-PROBNP
CASE NUMBER: 2018018000205
HX NT-PROBNP: 1076 pg/mL — ABNORMAL HIGH (ref 0.0–900.0)

## 2016-07-22 LAB — HX HEPATIC FUNCTION PANEL
CASE NUMBER: 2018018000577
HX ALBUMIN LVL: 1.7 g/dL — ABNORMAL LOW (ref 3.5–5.0)
HX ALKALINE PHOSPHATASE: 464 U/L — ABNORMAL HIGH (ref 45.0–117.0)
HX ALT: 100 U/L — ABNORMAL HIGH (ref 6.0–78.0)
HX AST: 81 U/L — ABNORMAL HIGH (ref 6.0–40.0)
HX BILIRUBIN DIRECT: 1.3 mg/dL — ABNORMAL HIGH (ref 0.0–0.3)
HX BILIRUBIN TOTAL: 1.7 mg/dL — ABNORMAL HIGH (ref 0.2–1.2)
HX TOTAL PROTEIN: 4.9 g/dL — ABNORMAL LOW (ref 6.0–8.0)

## 2016-07-22 LAB — HX PT
CASE NUMBER: 2018018000205
HX INR: 1.1
HX PT: 12 s — ABNORMAL HIGH (ref 9.3–11.6)

## 2016-07-22 LAB — HX CBC W/ INDICES
CASE NUMBER: 2018018000577
HX HCT: 23.4 % — ABNORMAL LOW (ref 36.0–47.0)
HX HGB: 7.7 g/dL — ABNORMAL LOW (ref 11.8–15.8)
HX MCH: 36 pg — ABNORMAL HIGH (ref 27.0–31.0)
HX MCHC: 32.9 g/dL — NL (ref 32.0–36.0)
HX MCV: 109 fL — ABNORMAL HIGH (ref 81.0–99.0)
HX MPV: 9.2 fL — NL (ref 7.4–11.5)
HX NRBC PERCENT: 0 % — NL
HX PLATELET: 242 10*3/uL — NL (ref 150.0–400.0)
HX RBC: 2.14 10*6/uL — ABNORMAL LOW (ref 3.6–5.0)
HX RDW: 13.5 % — NL (ref 11.5–14.5)
HX WBC: 6.5 10*3/uL — NL (ref 3.7–11.2)

## 2016-07-22 LAB — HX BASIC METABOLIC PANEL
CASE NUMBER: 2018018000577
HX ANION GAP: 11 — NL (ref 3.0–11.0)
HX BUN: 17 mg/dL — NL (ref 7.0–18.0)
HX CALCIUM LVL: 6.6 mg/dL — CR (ref 8.5–10.1)
HX CHLORIDE: 114 mmol/L — ABNORMAL HIGH (ref 98.0–110.0)
HX CO2: 20 mmol/L — ABNORMAL LOW (ref 21.0–32.0)
HX CREATININE: 1.84 mg/dL — ABNORMAL HIGH (ref 0.55–1.3)
HX GLUCOSE LVL: 88 mg/dL — NL (ref 65.0–110.0)
HX POTASSIUM LVL: 3.1 mmol/L — ABNORMAL LOW (ref 3.6–5.2)
HX SODIUM LVL: 145 mmol/L — NL (ref 136.0–145.0)

## 2016-07-22 LAB — HX DRUGS OF ABUSE URINE 9
CASE NUMBER: 2018018000250
HX U AMPHETAMINES SCRN: NEGATIVE — NL
HX U BARBITURATE SCRN: NEGATIVE — NL
HX U BENZODIAZEPINE SCRN: NEGATIVE — NL
HX U CANNABINOID SCRN: NEGATIVE — NL
HX U COCAINE SCRN: NEGATIVE — NL
HX U ETHANOL INTERP: NEGATIVE — NL
HX U ETHANOL: <3 — NL (ref 0.0–49.0)
HX U METHADONE SCRN: NEGATIVE — NL
HX U OPIATE SCRN: NEGATIVE — NL
HX U PH FOR DAU: 5 — NL (ref 4.5–8.0)
HX U PHENCYCLIDINE SCRN: NEGATIVE — NL

## 2016-07-22 LAB — HX URINALYSIS (COMPLETE)
CASE NUMBER: 2018018000206
HX UA BILIRUBIN: NEGATIVE — NL
HX UA BLOOD: NEGATIVE — NL
HX UA GLUCOSE: NEGATIVE — NL
HX UA KETONES: NEGATIVE — NL
HX UA LEUKOCYTE ESTERASE: NEGATIVE — NL
HX UA NITRITE: NEGATIVE — NL
HX UA PH: 5 — NL (ref 5.0–8.0)
HX UA PROTEIN: NEGATIVE — NL
HX UA RBC: <1 — NL (ref 0.0–3.0)
HX UA SPECIFIC GRAVITY: 1.005 — NL (ref 1.005–1.03)
HX UA SQUAMOUS EPITHELIAL: 4 /HPF — NL (ref 0.0–5.0)
HX UA UROBILINOGEN: 4 — AB
HX UA WBC: <1 — NL (ref 0.0–5.0)

## 2016-07-22 LAB — HX POTASSIUM LEVEL
CASE NUMBER: 2018018002527
HX POTASSIUM LVL: 3.6 mmol/L — NL (ref 3.6–5.2)

## 2016-07-22 LAB — HX PHOSPHORUS LEVEL
CASE NUMBER: 2018018000577
CASE NUMBER: 2018018002527
HX PHOSPHORUS: 2.2 mg/dL — ABNORMAL LOW (ref 2.4–4.9)
HX PHOSPHORUS: 1.9 mg/dL — ABNORMAL LOW (ref 2.4–4.9)

## 2016-07-22 LAB — HX ACETAMINOPHEN LEVEL
CASE NUMBER: 2018018000214
CASE NUMBER: 2018018000584
HX ACETAMINOPH LVL: 6 ug/mL — ABNORMAL LOW (ref 10.0–30.0)
HX ACETAMINOPH LVL: 3.4 ug/mL — ABNORMAL LOW (ref 10.0–30.0)

## 2016-07-22 LAB — HX HEPATITIS B SURFACE ANTIGEN
CASE NUMBER: 2018018000365
HX HBSAG MEASURED: <0.1 — NL (ref 0.0–0.99)
HX HEP B SURFACE AG: NEGATIVE
HX HEP BS AG INST: NONREACTIVE

## 2016-07-22 LAB — HX SALICYLATE LEVEL
CASE NUMBER: 2018018000214
HX SALICYLATE LEVEL: 3.7 mg/dL — NL (ref 2.8–20.0)

## 2016-07-22 LAB — HX HEPATITIS B CORE ANTIBODY IGM
CASE NUMBER: 2018018000365
HX HEP B CORE IGM: NONREACTIVE

## 2016-07-22 LAB — HX GLOMERULAR FILTRATION RATE (ESTIMATED)
CASE NUMBER: 2018018000577
HX AFN AMER GLOMERULAR FILTRATION RATE: 33 mL/min/{1.73_m2}
HX NON-AFN AMER GLOMERULAR FILTRATION RATE: 29 mL/min/{1.73_m2}

## 2016-07-22 LAB — HX LACTIC ACID
CASE NUMBER: 2018018000207
CASE NUMBER: 2018018000208
HX LACTIC ACID LVL: 0.3 mmol/L — ABNORMAL LOW (ref 0.4–2.0)
HX LACTIC ACID LVL: 0.9 mmol/L — NL (ref 0.4–2.0)

## 2016-07-22 LAB — HX CREATINE KINASE
CASE NUMBER: 2018018000205
HX TOTAL CK: 161 U/L — NL (ref 39.0–308.0)

## 2016-07-22 LAB — HX HEPATITIS C ANTIBODY
CASE NUMBER: 2018018000365
HX HEP C AB INST: NONREACTIVE
HX HEP C AB MEASURED: 0.1 — NL (ref 0.0–0.8)
HX HEP C AB: NONREACTIVE

## 2016-07-22 LAB — HX INFLUENZA A&B BY PCR
CASE NUMBER: 2018018000210
HX INFLUENZA A BY PCR: NOT DETECTED
HX INFLUENZA B BY PCR: NOT DETECTED

## 2016-07-22 LAB — HX HEPATITIS A ANTIBODY IGM
CASE NUMBER: 2018018000365
HX HEP A IGM: NONREACTIVE

## 2016-07-22 LAB — HX MAGNESIUM LEVEL
CASE NUMBER: 2018018000205
CASE NUMBER: 2018018000577
HX MAGNESIUM LVL: 1.7 mg/dL — ABNORMAL LOW (ref 1.8–2.4)
HX MAGNESIUM LVL: 2 mg/dL — NL (ref 1.8–2.4)

## 2016-07-23 LAB — HX BASIC METABOLIC PANEL
CASE NUMBER: 2018019000098
HX ANION GAP: 11 — NL (ref 3.0–11.0)
HX BUN: 13 mg/dL — NL (ref 7.0–18.0)
HX CALCIUM LVL: 7.2 mg/dL — ABNORMAL LOW (ref 8.5–10.1)
HX CHLORIDE: 119 mmol/L — ABNORMAL HIGH (ref 98.0–110.0)
HX CO2: 17 mmol/L — ABNORMAL LOW (ref 21.0–32.0)
HX CREATININE: 1.16 mg/dL — NL (ref 0.55–1.3)
HX GLUCOSE LVL: 90 mg/dL — NL (ref 65.0–110.0)
HX POTASSIUM LVL: 3.5 mmol/L — ABNORMAL LOW (ref 3.6–5.2)
HX SODIUM LVL: 147 mmol/L — ABNORMAL HIGH (ref 136.0–145.0)

## 2016-07-23 LAB — HX CBC W/ INDICES
CASE NUMBER: 2018019000098
HX HCT: 24.5 % — ABNORMAL LOW (ref 36.0–47.0)
HX HGB: 7.8 g/dL — ABNORMAL LOW (ref 11.8–15.8)
HX MCH: 35 pg — ABNORMAL HIGH (ref 27.0–31.0)
HX MCHC: 31.8 g/dL — ABNORMAL LOW (ref 32.0–36.0)
HX MCV: 111 fL — ABNORMAL HIGH (ref 81.0–99.0)
HX MPV: 9.6 fL — NL (ref 7.4–11.5)
HX NRBC PERCENT: 0 % — NL
HX PLATELET: 271 10*3/uL — NL (ref 150.0–400.0)
HX RBC: 2.21 10*6/uL — ABNORMAL LOW (ref 3.6–5.0)
HX RDW: 13.6 % — NL (ref 11.5–14.5)
HX WBC: 5.2 10*3/uL — NL (ref 3.7–11.2)

## 2016-07-23 LAB — HX HEPATIC FUNCTION PANEL
CASE NUMBER: 2018019000098
HX ALBUMIN LVL: 1.8 g/dL — ABNORMAL LOW (ref 3.5–5.0)
HX ALKALINE PHOSPHATASE: 419 U/L — ABNORMAL HIGH (ref 45.0–117.0)
HX ALT: 83 U/L — ABNORMAL HIGH (ref 6.0–78.0)
HX AST: 55 U/L — ABNORMAL HIGH (ref 6.0–40.0)
HX BILIRUBIN DIRECT: 0.8 mg/dL — ABNORMAL HIGH (ref 0.0–0.3)
HX BILIRUBIN TOTAL: 1.1 mg/dL — NL (ref 0.2–1.2)
HX TOTAL PROTEIN: 5.6 g/dL — ABNORMAL LOW (ref 6.0–8.0)

## 2016-07-23 LAB — HX GLOMERULAR FILTRATION RATE (ESTIMATED)
CASE NUMBER: 2018019000098
HX AFN AMER GLOMERULAR FILTRATION RATE: 58 mL/min/{1.73_m2}
HX NON-AFN AMER GLOMERULAR FILTRATION RATE: 50 mL/min/{1.73_m2}

## 2016-07-23 LAB — HX MAGNESIUM LEVEL
CASE NUMBER: 2018019000098
HX MAGNESIUM LVL: 2 mg/dL — NL (ref 1.8–2.4)

## 2016-07-24 LAB — HX BASIC METABOLIC PANEL
CASE NUMBER: 2018020000087
HX ANION GAP: 11 — NL (ref 3.0–11.0)
HX BUN: 10 mg/dL — NL (ref 7.0–18.0)
HX CALCIUM LVL: 7.2 mg/dL — ABNORMAL LOW (ref 8.5–10.1)
HX CHLORIDE: 119 mmol/L — ABNORMAL HIGH (ref 98.0–110.0)
HX CO2: 18 mmol/L — ABNORMAL LOW (ref 21.0–32.0)
HX CREATININE: 0.806 mg/dL — NL (ref 0.55–1.3)
HX GLUCOSE LVL: 77 mg/dL — NL (ref 65.0–110.0)
HX POTASSIUM LVL: 4.1 mmol/L — NL (ref 3.6–5.2)
HX SODIUM LVL: 148 mmol/L — ABNORMAL HIGH (ref 136.0–145.0)

## 2016-07-24 LAB — HX CBC W/ INDICES
CASE NUMBER: 2018020000087
HX HCT: 23.4 % — ABNORMAL LOW (ref 36.0–47.0)
HX HGB: 7.2 g/dL — ABNORMAL LOW (ref 11.8–15.8)
HX MCH: 35 pg — ABNORMAL HIGH (ref 27.0–31.0)
HX MCHC: 30.8 g/dL — ABNORMAL LOW (ref 32.0–36.0)
HX MCV: 114 fL — ABNORMAL HIGH (ref 81.0–99.0)
HX MPV: 9.6 fL — NL (ref 7.4–11.5)
HX NRBC PERCENT: 0 % — NL
HX PLATELET: 223 10*3/uL — NL (ref 150.0–400.0)
HX RBC: 2.05 10*6/uL — ABNORMAL LOW (ref 3.6–5.0)
HX RDW: 13.5 % — NL (ref 11.5–14.5)
HX WBC: 4.4 10*3/uL — NL (ref 3.7–11.2)

## 2016-07-24 LAB — HX INFLUENZA A&B BY PCR
CASE NUMBER: 2018020001596
HX INFLUENZA A BY PCR: NOT DETECTED
HX INFLUENZA B BY PCR: NOT DETECTED

## 2016-07-24 LAB — HX HEPATIC FUNCTION PANEL
CASE NUMBER: 2018020000087
HX ALBUMIN LVL: 1.8 g/dL — ABNORMAL LOW (ref 3.5–5.0)
HX ALKALINE PHOSPHATASE: 378 U/L — ABNORMAL HIGH (ref 45.0–117.0)
HX ALT: 67 U/L — NL (ref 6.0–78.0)
HX AST: 44 U/L — ABNORMAL HIGH (ref 6.0–40.0)
HX BILIRUBIN DIRECT: 0.6 mg/dL — ABNORMAL HIGH (ref 0.0–0.3)
HX BILIRUBIN TOTAL: 0.9 mg/dL — NL (ref 0.2–1.2)
HX TOTAL PROTEIN: 5 g/dL — ABNORMAL LOW (ref 6.0–8.0)

## 2016-07-24 LAB — HX MAGNESIUM LEVEL
CASE NUMBER: 2018020000087
HX MAGNESIUM LVL: 1.9 mg/dL — NL (ref 1.8–2.4)

## 2016-07-24 LAB — HX GLOMERULAR FILTRATION RATE (ESTIMATED)
CASE NUMBER: 2018020000087
HX AFN AMER GLOMERULAR FILTRATION RATE: 89 mL/min/{1.73_m2}
HX NON-AFN AMER GLOMERULAR FILTRATION RATE: 77 mL/min/{1.73_m2}

## 2016-07-25 LAB — HX CBC W/ INDICES
CASE NUMBER: 2018021000102
HX HCT: 24.1 % — ABNORMAL LOW (ref 36.0–47.0)
HX HGB: 7.5 g/dL — ABNORMAL LOW (ref 11.8–15.8)
HX MCH: 35 pg — ABNORMAL HIGH (ref 27.0–31.0)
HX MCHC: 31.1 g/dL — ABNORMAL LOW (ref 32.0–36.0)
HX MCV: 112 fL — ABNORMAL HIGH (ref 81.0–99.0)
HX MPV: 9.4 fL — NL (ref 7.4–11.5)
HX NRBC PERCENT: 0 % — NL
HX PLATELET: 244 10*3/uL — NL (ref 150.0–400.0)
HX RBC: 2.16 10*6/uL — ABNORMAL LOW (ref 3.6–5.0)
HX RDW: 13.7 % — NL (ref 11.5–14.5)
HX WBC: 6.4 10*3/uL — NL (ref 3.7–11.2)

## 2016-07-25 LAB — HX HEPATIC FUNCTION PANEL
CASE NUMBER: 2018021000102
HX ALBUMIN LVL: 1.7 g/dL — ABNORMAL LOW (ref 3.5–5.0)
HX ALKALINE PHOSPHATASE: 459 U/L — ABNORMAL HIGH (ref 45.0–117.0)
HX ALT: 211 U/L — ABNORMAL HIGH (ref 6.0–78.0)
HX AST: 488 U/L — ABNORMAL HIGH (ref 6.0–40.0)
HX BILIRUBIN DIRECT: 3.5 mg/dL — ABNORMAL HIGH (ref 0.0–0.3)
HX BILIRUBIN TOTAL: 4 mg/dL — ABNORMAL HIGH (ref 0.2–1.2)
HX TOTAL PROTEIN: 5.1 g/dL — ABNORMAL LOW (ref 6.0–8.0)

## 2016-07-25 LAB — HX BASIC METABOLIC PANEL
CASE NUMBER: 2018021000102
HX ANION GAP: 11 — NL (ref 3.0–11.0)
HX BUN: 11 mg/dL — NL (ref 7.0–18.0)
HX CALCIUM LVL: 7.7 mg/dL — ABNORMAL LOW (ref 8.5–10.1)
HX CHLORIDE: 117 mmol/L — ABNORMAL HIGH (ref 98.0–110.0)
HX CO2: 19 mmol/L — ABNORMAL LOW (ref 21.0–32.0)
HX CREATININE: 1.08 mg/dL — NL (ref 0.55–1.3)
HX GLUCOSE LVL: 112 mg/dL — ABNORMAL HIGH (ref 65.0–110.0)
HX POTASSIUM LVL: 4.3 mmol/L — NL (ref 3.6–5.2)
HX SODIUM LVL: 147 mmol/L — ABNORMAL HIGH (ref 136.0–145.0)

## 2016-07-25 LAB — HX MAGNESIUM LEVEL
CASE NUMBER: 2018021000102
HX MAGNESIUM LVL: 1.6 mg/dL — ABNORMAL LOW (ref 1.8–2.4)

## 2016-07-25 LAB — HX GLOMERULAR FILTRATION RATE (ESTIMATED)
CASE NUMBER: 2018021000102
HX AFN AMER GLOMERULAR FILTRATION RATE: 63 mL/min/{1.73_m2}
HX NON-AFN AMER GLOMERULAR FILTRATION RATE: 54 mL/min/{1.73_m2}

## 2016-07-25 LAB — HX NT-PROBNP
CASE NUMBER: 16084541
HX NT-PROBNP: 11525 pg/mL — ABNORMAL HIGH (ref 0.0–900.0)

## 2016-07-26 LAB — HX CBC W/ INDICES
CASE NUMBER: 2018022000096
HX HCT: 23.5 % — ABNORMAL LOW (ref 36.0–47.0)
HX HGB: 7.5 g/dL — ABNORMAL LOW (ref 11.8–15.8)
HX MCH: 36 pg — ABNORMAL HIGH (ref 27.0–31.0)
HX MCHC: 31.9 g/dL — ABNORMAL LOW (ref 32.0–36.0)
HX MCV: 112 fL — ABNORMAL HIGH (ref 81.0–99.0)
HX MPV: 9.5 fL — NL (ref 7.4–11.5)
HX NRBC PERCENT: 0 % — NL
HX PLATELET: 243 10*3/uL — NL (ref 150.0–400.0)
HX RBC: 2.09 10*6/uL — ABNORMAL LOW (ref 3.6–5.0)
HX RDW: 13.9 % — NL (ref 11.5–14.5)
HX WBC: 6.4 10*3/uL — NL (ref 3.7–11.2)

## 2016-07-26 LAB — HX URINE CULTURE
CASE NUMBER: 2018020001605
HX F: NO GROWTH
HX P: NO GROWTH

## 2016-07-26 LAB — HX HEPATIC FUNCTION PANEL
CASE NUMBER: 2018022000096
HX ALBUMIN LVL: 1.7 g/dL — ABNORMAL LOW (ref 3.5–5.0)
HX ALKALINE PHOSPHATASE: 448 U/L — ABNORMAL HIGH (ref 45.0–117.0)
HX ALT: 176 U/L — ABNORMAL HIGH (ref 6.0–78.0)
HX AST: 223 U/L — ABNORMAL HIGH (ref 6.0–40.0)
HX BILIRUBIN DIRECT: 3 mg/dL — ABNORMAL HIGH (ref 0.0–0.3)
HX BILIRUBIN TOTAL: 3.6 mg/dL — ABNORMAL HIGH (ref 0.2–1.2)
HX TOTAL PROTEIN: 5.4 g/dL — ABNORMAL LOW (ref 6.0–8.0)

## 2016-07-26 LAB — HX BASIC METABOLIC PANEL
CASE NUMBER: 2018022000096
HX ANION GAP: 11 — NL (ref 3.0–11.0)
HX BUN: 10 mg/dL — NL (ref 7.0–18.0)
HX CALCIUM LVL: 7.6 mg/dL — ABNORMAL LOW (ref 8.5–10.1)
HX CHLORIDE: 113 mmol/L — ABNORMAL HIGH (ref 98.0–110.0)
HX CO2: 18 mmol/L — ABNORMAL LOW (ref 21.0–32.0)
HX CREATININE: 0.969 mg/dL — NL (ref 0.55–1.3)
HX GLUCOSE LVL: 105 mg/dL — NL (ref 65.0–110.0)
HX POTASSIUM LVL: 3.6 mmol/L — NL (ref 3.6–5.2)
HX SODIUM LVL: 142 mmol/L — NL (ref 136.0–145.0)

## 2016-07-26 LAB — HX MAGNESIUM LEVEL
CASE NUMBER: 2018022000096
HX MAGNESIUM LVL: 1.8 mg/dL — NL (ref 1.8–2.4)

## 2016-07-26 LAB — HX BLOOD CULTURE
CASE NUMBER: 2018018000211
CASE NUMBER: 2018018000212
HX F: NO GROWTH
HX F: NO GROWTH

## 2016-07-26 LAB — HX GLOMERULAR FILTRATION RATE (ESTIMATED)
CASE NUMBER: 2018022000096
HX AFN AMER GLOMERULAR FILTRATION RATE: 71 mL/min/{1.73_m2}
HX NON-AFN AMER GLOMERULAR FILTRATION RATE: 62 mL/min/{1.73_m2}

## 2016-07-27 LAB — HX CBC W/ DIFF
CASE NUMBER: 2018023000471
HX HCT: 23.1 % — ABNORMAL LOW (ref 36.0–47.0)
HX HGB: 7.3 g/dL — ABNORMAL LOW (ref 11.8–15.8)
HX MCH: 35 pg — ABNORMAL HIGH (ref 27.0–31.0)
HX MCHC: 31.6 g/dL — ABNORMAL LOW (ref 32.0–36.0)
HX MCV: 110 fL — ABNORMAL HIGH (ref 81.0–99.0)
HX MPV: 9.6 fL — NL (ref 7.4–11.5)
HX NRBC PERCENT: 0 % — NL
HX PLATELET: 266 10*3/uL — NL (ref 150.0–400.0)
HX RBC: 2.09 10*6/uL — ABNORMAL LOW (ref 3.6–5.0)
HX RDW: 13.7 % — NL (ref 11.5–14.5)
HX WBC: 7.1 10*3/uL — NL (ref 3.7–11.2)

## 2016-07-27 LAB — HX GLOMERULAR FILTRATION RATE (ESTIMATED)
CASE NUMBER: 2018023000162
HX AFN AMER GLOMERULAR FILTRATION RATE: >90
HX NON-AFN AMER GLOMERULAR FILTRATION RATE: 81 mL/min/{1.73_m2}

## 2016-07-27 LAB — HX COMPREHENSIVE METABOLIC PANEL
CASE NUMBER: 2018023000162
HX ALBUMIN LVL: 1.7 g/dL — ABNORMAL LOW (ref 3.5–5.0)
HX ALKALINE PHOSPHATASE: 441 U/L — ABNORMAL HIGH (ref 45.0–117.0)
HX ALT: 150 U/L — ABNORMAL HIGH (ref 6.0–78.0)
HX ANION GAP: 9 — NL (ref 3.0–11.0)
HX AST: 150 U/L — ABNORMAL HIGH (ref 6.0–40.0)
HX BILIRUBIN TOTAL: 4 mg/dL — ABNORMAL HIGH (ref 0.2–1.2)
HX BUN: 7 mg/dL — NL (ref 7.0–18.0)
HX CALCIUM LVL: 7.6 mg/dL — ABNORMAL LOW (ref 8.5–10.1)
HX CHLORIDE: 112 mmol/L — ABNORMAL HIGH (ref 98.0–110.0)
HX CO2: 20 mmol/L — ABNORMAL LOW (ref 21.0–32.0)
HX CREATININE: 0.777 mg/dL — NL (ref 0.55–1.3)
HX GLUCOSE LVL: 92 mg/dL — NL (ref 65.0–110.0)
HX POTASSIUM LVL: 3.5 mmol/L — ABNORMAL LOW (ref 3.6–5.2)
HX SODIUM LVL: 141 mmol/L — NL (ref 136.0–145.0)
HX TOTAL PROTEIN: 5.5 g/dL — ABNORMAL LOW (ref 6.0–8.0)

## 2016-07-27 LAB — HX PT
CASE NUMBER: 2018023000471
HX INR: 1.1
HX PT: 12.1 s — ABNORMAL HIGH (ref 9.3–11.6)

## 2016-07-27 LAB — HX BILIRUBIN DIRECT
CASE NUMBER: 2018023000162
HX BILIRUBIN DIRECT: 3.1 mg/dL — ABNORMAL HIGH (ref 0.0–0.3)

## 2016-07-27 LAB — HX .AUTOMATED DIFF
CASE NUMBER: 2018023000471
HX ABSOLUTE NEUTRO COUNT: 5500 /mm3
HX BASOPHILS: 0 % — NL (ref 0.0–1.0)
HX EOSINOPHILS: 1 % — NL (ref 0.0–3.0)
HX IMMATURE GRANULOCYTES: 1 % — NL (ref 0.0–2.0)
HX LYMPHOCYTES: 12 % — ABNORMAL LOW (ref 22.0–40.0)
HX MONOCYTES: 8 % — NL (ref 0.0–11.0)
HX NEU CT MEASURED: 5.5
HX NEUTROPHILS: 78 % — ABNORMAL HIGH (ref 40.0–71.0)

## 2016-07-27 LAB — HX MAGNESIUM LEVEL
CASE NUMBER: 2018023000162
HX MAGNESIUM LVL: 1.9 mg/dL — NL (ref 1.8–2.4)

## 2016-07-28 LAB — HX CBC W/ INDICES
CASE NUMBER: 2018024000144
HX HCT: 20.3 % — CR (ref 36.0–47.0)
HX HGB: 6.4 g/dL — CR (ref 11.8–15.8)
HX MCH: 34 pg — ABNORMAL HIGH (ref 27.0–31.0)
HX MCHC: 31.5 g/dL — ABNORMAL LOW (ref 32.0–36.0)
HX MCV: 109 fL — ABNORMAL HIGH (ref 81.0–99.0)
HX MPV: 9.8 fL — NL (ref 7.4–11.5)
HX NRBC PERCENT: 0 % — NL
HX PLATELET: 222 10*3/uL — NL (ref 150.0–400.0)
HX RBC: 1.86 10*6/uL — ABNORMAL LOW (ref 3.6–5.0)
HX RDW: 13.5 % — NL (ref 11.5–14.5)
HX WBC: 4.5 10*3/uL — NL (ref 3.7–11.2)

## 2016-07-28 LAB — HX GLOMERULAR FILTRATION RATE (ESTIMATED)
CASE NUMBER: 2018024000144
HX AFN AMER GLOMERULAR FILTRATION RATE: >90
HX NON-AFN AMER GLOMERULAR FILTRATION RATE: >90

## 2016-07-28 LAB — HX BASIC METABOLIC PANEL
CASE NUMBER: 2018024000144
HX ANION GAP: 8 — NL (ref 3.0–11.0)
HX BUN: 5 mg/dL — ABNORMAL LOW (ref 7.0–18.0)
HX CALCIUM LVL: 7.6 mg/dL — ABNORMAL LOW (ref 8.5–10.1)
HX CHLORIDE: 116 mmol/L — ABNORMAL HIGH (ref 98.0–110.0)
HX CO2: 21 mmol/L — NL (ref 21.0–32.0)
HX CREATININE: 0.666 mg/dL — NL (ref 0.55–1.3)
HX GLUCOSE LVL: 73 mg/dL — NL (ref 65.0–110.0)
HX POTASSIUM LVL: 3.6 mmol/L — NL (ref 3.6–5.2)
HX SODIUM LVL: 145 mmol/L — NL (ref 136.0–145.0)

## 2016-07-28 LAB — HX .ABO/RH TYPE TUBE
CASE NUMBER: 2018024000753
HX A1: 0 — NL
HX ABORH: A POS — NL
HX ANTI-B: 0 — NL

## 2016-07-28 LAB — HX ZZANTIBODY SCREEN
CASE NUMBER: 2018024000753
HX ANTIBODY SCREEN: NEGATIVE — NL
HX G1: 0 — NL
HX G2: 0 — NL
HX G3: 0 — NL

## 2016-07-28 LAB — HX HEPATIC FUNCTION PANEL
CASE NUMBER: 2018024000144
HX ALBUMIN LVL: 1.5 g/dL — ABNORMAL LOW (ref 3.5–5.0)
HX ALKALINE PHOSPHATASE: 388 U/L — ABNORMAL HIGH (ref 45.0–117.0)
HX ALT: 106 U/L — ABNORMAL HIGH (ref 6.0–78.0)
HX AST: 90 U/L — ABNORMAL HIGH (ref 6.0–40.0)
HX BILIRUBIN DIRECT: 2 mg/dL — ABNORMAL HIGH (ref 0.0–0.3)
HX BILIRUBIN TOTAL: 2.6 mg/dL — ABNORMAL HIGH (ref 0.2–1.2)
HX TOTAL PROTEIN: 5 g/dL — ABNORMAL LOW (ref 6.0–8.0)

## 2016-07-28 LAB — HX MAGNESIUM LEVEL
CASE NUMBER: 2018024000144
HX MAGNESIUM LVL: 2 mg/dL — NL (ref 1.8–2.4)

## 2016-07-28 LAB — HX .REDCELL
CASE NUMBER: 2018024000753
HX ADDITIONAL UNITS TO CROSSMATCH: 0 — NL
HX NUMBER OF UNITS TO TRANSFUSE: 2

## 2016-07-28 LAB — HX CROSSMATCH: CASE NUMBER: 2018024000753

## 2016-07-28 LAB — HX BLOOD CULTURE
CASE NUMBER: 2018020001598
HX F: NO GROWTH

## 2016-07-29 LAB — HX CBC W/ INDICES
CASE NUMBER: 2018025000151
HX HCT: 28.1 % — ABNORMAL LOW (ref 36.0–47.0)
HX HGB: 9.2 g/dL — ABNORMAL LOW (ref 11.8–15.8)
HX MCH: 33 pg — ABNORMAL HIGH (ref 27.0–31.0)
HX MCHC: 32.7 g/dL — NL (ref 32.0–36.0)
HX MCV: 100 fL — ABNORMAL HIGH (ref 81.0–99.0)
HX MPV: 9.5 fL — NL (ref 7.4–11.5)
HX NRBC PERCENT: 0 % — NL
HX PLATELET: 254 10*3/uL — NL (ref 150.0–400.0)
HX RBC: 2.8 10*6/uL — ABNORMAL LOW (ref 3.6–5.0)
HX RDW: 19.3 % — ABNORMAL HIGH (ref 11.5–14.5)
HX WBC: 5.4 10*3/uL — NL (ref 3.7–11.2)

## 2016-07-29 LAB — HX HEPATIC FUNCTION PANEL
CASE NUMBER: 2018025000151
HX ALBUMIN LVL: 1.6 g/dL — ABNORMAL LOW (ref 3.5–5.0)
HX ALKALINE PHOSPHATASE: 392 U/L — ABNORMAL HIGH (ref 45.0–117.0)
HX ALT: 93 U/L — ABNORMAL HIGH (ref 6.0–78.0)
HX AST: 76 U/L — ABNORMAL HIGH (ref 6.0–40.0)
HX BILIRUBIN DIRECT: 1.9 mg/dL — ABNORMAL HIGH (ref 0.0–0.3)
HX BILIRUBIN TOTAL: 2.5 mg/dL — ABNORMAL HIGH (ref 0.2–1.2)
HX TOTAL PROTEIN: 5.5 g/dL — ABNORMAL LOW (ref 6.0–8.0)

## 2016-07-29 LAB — HX BASIC METABOLIC PANEL
CASE NUMBER: 2018025000151
HX ANION GAP: 8 — NL (ref 3.0–11.0)
HX BUN: 3 mg/dL — ABNORMAL LOW (ref 7.0–18.0)
HX CALCIUM LVL: 7.8 mg/dL — ABNORMAL LOW (ref 8.5–10.1)
HX CHLORIDE: 115 mmol/L — ABNORMAL HIGH (ref 98.0–110.0)
HX CO2: 21 mmol/L — NL (ref 21.0–32.0)
HX CREATININE: 0.651 mg/dL — NL (ref 0.55–1.3)
HX GLUCOSE LVL: 76 mg/dL — NL (ref 65.0–110.0)
HX POTASSIUM LVL: 3.9 mmol/L — NL (ref 3.6–5.2)
HX SODIUM LVL: 144 mmol/L — NL (ref 136.0–145.0)

## 2016-07-29 LAB — HX GLOMERULAR FILTRATION RATE (ESTIMATED)
CASE NUMBER: 2018025000151
HX AFN AMER GLOMERULAR FILTRATION RATE: >90
HX NON-AFN AMER GLOMERULAR FILTRATION RATE: >90

## 2016-07-29 LAB — HX MAGNESIUM LEVEL
CASE NUMBER: 2018025000151
HX MAGNESIUM LVL: 2 mg/dL — NL (ref 1.8–2.4)

## 2016-07-30 LAB — HX COMPREHENSIVE METABOLIC PANEL
CASE NUMBER: 2018026000425
HX ALBUMIN LVL: 1.7 g/dL — ABNORMAL LOW (ref 3.5–5.0)
HX ALKALINE PHOSPHATASE: 357 U/L — ABNORMAL HIGH (ref 45.0–117.0)
HX ALT: 74 U/L — NL (ref 6.0–78.0)
HX ANION GAP: 8 — NL (ref 3.0–11.0)
HX AST: 47 U/L — ABNORMAL HIGH (ref 6.0–40.0)
HX BILIRUBIN TOTAL: 2.4 mg/dL — ABNORMAL HIGH (ref 0.2–1.2)
HX BUN: 4 mg/dL — ABNORMAL LOW (ref 7.0–18.0)
HX CALCIUM LVL: 7.7 mg/dL — ABNORMAL LOW (ref 8.5–10.1)
HX CHLORIDE: 113 mmol/L — ABNORMAL HIGH (ref 98.0–110.0)
HX CO2: 22 mmol/L — NL (ref 21.0–32.0)
HX CREATININE: 0.593 mg/dL — NL (ref 0.55–1.3)
HX GLUCOSE LVL: 75 mg/dL — NL (ref 65.0–110.0)
HX POTASSIUM LVL: 3.5 mmol/L — ABNORMAL LOW (ref 3.6–5.2)
HX SODIUM LVL: 143 mmol/L — NL (ref 136.0–145.0)
HX TOTAL PROTEIN: 5.6 g/dL — ABNORMAL LOW (ref 6.0–8.0)

## 2016-07-30 LAB — HX CBC W/ INDICES
CASE NUMBER: 2018026000138
HX HCT: 27.6 % — ABNORMAL LOW (ref 36.0–47.0)
HX HGB: 8.7 g/dL — ABNORMAL LOW (ref 11.8–15.8)
HX MCH: 33 pg — ABNORMAL HIGH (ref 27.0–31.0)
HX MCHC: 31.5 g/dL — ABNORMAL LOW (ref 32.0–36.0)
HX MCV: 103 fL — ABNORMAL HIGH (ref 81.0–99.0)
HX MPV: 9.4 fL — NL (ref 7.4–11.5)
HX NRBC PERCENT: 0 % — NL
HX PLATELET: 249 10*3/uL — NL (ref 150.0–400.0)
HX RBC: 2.67 10*6/uL — ABNORMAL LOW (ref 3.6–5.0)
HX RDW: 18.8 % — ABNORMAL HIGH (ref 11.5–14.5)
HX WBC: 6.6 10*3/uL — NL (ref 3.7–11.2)

## 2016-07-30 LAB — HX BASIC METABOLIC PANEL
CASE NUMBER: 2018026000138
HX ANION GAP: 8 — NL (ref 3.0–11.0)
HX BUN: 4 mg/dL — ABNORMAL LOW (ref 7.0–18.0)
HX CALCIUM LVL: 7.9 mg/dL — ABNORMAL LOW (ref 8.5–10.1)
HX CHLORIDE: 113 mmol/L — ABNORMAL HIGH (ref 98.0–110.0)
HX CO2: 22 mmol/L — NL (ref 21.0–32.0)
HX CREATININE: 0.612 mg/dL — NL (ref 0.55–1.3)
HX GLUCOSE LVL: 73 mg/dL — NL (ref 65.0–110.0)
HX POTASSIUM LVL: 3.6 mmol/L — NL (ref 3.6–5.2)
HX SODIUM LVL: 143 mmol/L — NL (ref 136.0–145.0)

## 2016-07-30 LAB — HX MAGNESIUM LEVEL
CASE NUMBER: 2018026000138
HX MAGNESIUM LVL: 2 mg/dL — NL (ref 1.8–2.4)

## 2016-07-30 LAB — HX BLOOD CULTURE
CASE NUMBER: 2018022000097
CASE NUMBER: 2018022000098
HX F: NO GROWTH
HX F: NO GROWTH

## 2016-07-30 LAB — HX HEPATIC FUNCTION PANEL
CASE NUMBER: 2018026000138
HX ALBUMIN LVL: 1.7 g/dL — ABNORMAL LOW (ref 3.5–5.0)
HX ALKALINE PHOSPHATASE: 360 U/L — ABNORMAL HIGH (ref 45.0–117.0)
HX ALT: 73 U/L — NL (ref 6.0–78.0)
HX AST: 46 U/L — ABNORMAL HIGH (ref 6.0–40.0)
HX BILIRUBIN DIRECT: 1.7 mg/dL — ABNORMAL HIGH (ref 0.0–0.3)
HX BILIRUBIN TOTAL: 2.4 mg/dL — ABNORMAL HIGH (ref 0.2–1.2)
HX TOTAL PROTEIN: 5.5 g/dL — ABNORMAL LOW (ref 6.0–8.0)

## 2016-07-30 LAB — HX GLOMERULAR FILTRATION RATE (ESTIMATED)
CASE NUMBER: 2018026000425
HX AFN AMER GLOMERULAR FILTRATION RATE: >90
HX NON-AFN AMER GLOMERULAR FILTRATION RATE: >90

## 2016-07-31 LAB — HX CBC W/ INDICES
CASE NUMBER: 2018027000136
HX HCT: 26 % — ABNORMAL LOW (ref 36.0–47.0)
HX HGB: 8.5 g/dL — ABNORMAL LOW (ref 11.8–15.8)
HX MCH: 34 pg — ABNORMAL HIGH (ref 27.0–31.0)
HX MCHC: 32.7 g/dL — NL (ref 32.0–36.0)
HX MCV: 103 fL — ABNORMAL HIGH (ref 81.0–99.0)
HX MPV: 9.3 fL — NL (ref 7.4–11.5)
HX NRBC PERCENT: 0 % — NL
HX PLATELET: 243 10*3/uL — NL (ref 150.0–400.0)
HX RBC: 2.52 10*6/uL — ABNORMAL LOW (ref 3.6–5.0)
HX RDW: 17.7 % — ABNORMAL HIGH (ref 11.5–14.5)
HX WBC: 6.7 10*3/uL — NL (ref 3.7–11.2)

## 2016-07-31 LAB — HX MAGNESIUM LEVEL
CASE NUMBER: 2018027000136
HX MAGNESIUM LVL: 1.8 mg/dL — NL (ref 1.8–2.4)

## 2016-07-31 LAB — HX HEPATIC FUNCTION PANEL
CASE NUMBER: 2018027000136
HX ALBUMIN LVL: 1.7 g/dL — ABNORMAL LOW (ref 3.5–5.0)
HX ALKALINE PHOSPHATASE: 319 U/L — ABNORMAL HIGH (ref 45.0–117.0)
HX ALT: 54 U/L — NL (ref 6.0–78.0)
HX AST: 31 U/L — NL (ref 6.0–40.0)
HX BILIRUBIN DIRECT: 1.6 mg/dL — ABNORMAL HIGH (ref 0.0–0.3)
HX BILIRUBIN TOTAL: 2.1 mg/dL — ABNORMAL HIGH (ref 0.2–1.2)
HX TOTAL PROTEIN: 5 g/dL — ABNORMAL LOW (ref 6.0–8.0)

## 2016-07-31 LAB — HX BASIC METABOLIC PANEL
CASE NUMBER: 2018027000136
HX ANION GAP: 9 — NL (ref 3.0–11.0)
HX BUN: 3 mg/dL — ABNORMAL LOW (ref 7.0–18.0)
HX CALCIUM LVL: 7.7 mg/dL — ABNORMAL LOW (ref 8.5–10.1)
HX CHLORIDE: 112 mmol/L — ABNORMAL HIGH (ref 98.0–110.0)
HX CO2: 23 mmol/L — NL (ref 21.0–32.0)
HX CREATININE: 0.567 mg/dL — NL (ref 0.55–1.3)
HX GLUCOSE LVL: 91 mg/dL — NL (ref 65.0–110.0)
HX POTASSIUM LVL: 3.4 mmol/L — ABNORMAL LOW (ref 3.6–5.2)
HX SODIUM LVL: 144 mmol/L — NL (ref 136.0–145.0)

## 2016-07-31 LAB — HX GLOMERULAR FILTRATION RATE (ESTIMATED)
CASE NUMBER: 2018027000136
HX AFN AMER GLOMERULAR FILTRATION RATE: >90
HX NON-AFN AMER GLOMERULAR FILTRATION RATE: >90

## 2016-08-01 LAB — HX BASIC METABOLIC PANEL
CASE NUMBER: 2018028000102
HX ANION GAP: 8 — NL (ref 3.0–11.0)
HX BUN: 3 mg/dL — ABNORMAL LOW (ref 7.0–18.0)
HX CALCIUM LVL: 7.7 mg/dL — ABNORMAL LOW (ref 8.5–10.1)
HX CHLORIDE: 112 mmol/L — ABNORMAL HIGH (ref 98.0–110.0)
HX CO2: 24 mmol/L — NL (ref 21.0–32.0)
HX CREATININE: 0.595 mg/dL — NL (ref 0.55–1.3)
HX GLUCOSE LVL: 93 mg/dL — NL (ref 65.0–110.0)
HX POTASSIUM LVL: 3.7 mmol/L — NL (ref 3.6–5.2)
HX SODIUM LVL: 144 mmol/L — NL (ref 136.0–145.0)

## 2016-08-01 LAB — HX CBC W/ INDICES
CASE NUMBER: 2018028000102
HX HCT: 26.7 % — ABNORMAL LOW (ref 36.0–47.0)
HX HGB: 8.6 g/dL — ABNORMAL LOW (ref 11.8–15.8)
HX MCH: 33 pg — ABNORMAL HIGH (ref 27.0–31.0)
HX MCHC: 32.2 g/dL — NL (ref 32.0–36.0)
HX MCV: 102 fL — ABNORMAL HIGH (ref 81.0–99.0)
HX MPV: 9.4 fL — NL (ref 7.4–11.5)
HX NRBC PERCENT: 0 % — NL
HX PLATELET: 269 10*3/uL — NL (ref 150.0–400.0)
HX RBC: 2.63 10*6/uL — ABNORMAL LOW (ref 3.6–5.0)
HX RDW: 17.3 % — ABNORMAL HIGH (ref 11.5–14.5)
HX WBC: 6.7 10*3/uL — NL (ref 3.7–11.2)

## 2016-08-01 LAB — HX GLOMERULAR FILTRATION RATE (ESTIMATED)
CASE NUMBER: 2018028000102
HX AFN AMER GLOMERULAR FILTRATION RATE: >90
HX NON-AFN AMER GLOMERULAR FILTRATION RATE: >90

## 2016-08-01 LAB — HX MAGNESIUM LEVEL
CASE NUMBER: 2018028000102
HX MAGNESIUM LVL: 1.9 mg/dL — NL (ref 1.8–2.4)

## 2016-08-01 LAB — HX HEPATIC FUNCTION PANEL
CASE NUMBER: 2018028000102
HX ALBUMIN LVL: 1.8 g/dL — ABNORMAL LOW (ref 3.5–5.0)
HX ALKALINE PHOSPHATASE: 317 U/L — ABNORMAL HIGH (ref 45.0–117.0)
HX ALT: 44 U/L — NL (ref 6.0–78.0)
HX AST: 29 U/L — NL (ref 6.0–40.0)
HX BILIRUBIN DIRECT: 1.5 mg/dL — ABNORMAL HIGH (ref 0.0–0.3)
HX BILIRUBIN TOTAL: 1.9 mg/dL — ABNORMAL HIGH (ref 0.2–1.2)
HX TOTAL PROTEIN: 5.1 g/dL — ABNORMAL LOW (ref 6.0–8.0)

## 2016-08-02 LAB — HX BASIC METABOLIC PANEL
CASE NUMBER: 2018029000091
HX ANION GAP: 8 — NL (ref 3.0–11.0)
HX BUN: 3 mg/dL — ABNORMAL LOW (ref 7.0–18.0)
HX CALCIUM LVL: 7.9 mg/dL — ABNORMAL LOW (ref 8.5–10.1)
HX CHLORIDE: 111 mmol/L — ABNORMAL HIGH (ref 98.0–110.0)
HX CO2: 24 mmol/L — NL (ref 21.0–32.0)
HX CREATININE: 0.591 mg/dL — NL (ref 0.55–1.3)
HX GLUCOSE LVL: 89 mg/dL — NL (ref 65.0–110.0)
HX POTASSIUM LVL: 3.4 mmol/L — ABNORMAL LOW (ref 3.6–5.2)
HX SODIUM LVL: 143 mmol/L — NL (ref 136.0–145.0)

## 2016-08-02 LAB — HX MAGNESIUM LEVEL
CASE NUMBER: 2018029000091
HX MAGNESIUM LVL: 1.8 mg/dL — NL (ref 1.8–2.4)

## 2016-08-02 LAB — HX HEPATIC FUNCTION PANEL
CASE NUMBER: 2018029000091
HX ALBUMIN LVL: 1.6 g/dL — ABNORMAL LOW (ref 3.5–5.0)
HX ALKALINE PHOSPHATASE: 260 U/L — ABNORMAL HIGH (ref 45.0–117.0)
HX ALT: 33 U/L — NL (ref 6.0–78.0)
HX AST: 22 U/L — NL (ref 6.0–40.0)
HX BILIRUBIN DIRECT: 1.1 mg/dL — ABNORMAL HIGH (ref 0.0–0.3)
HX BILIRUBIN TOTAL: 1.6 mg/dL — ABNORMAL HIGH (ref 0.2–1.2)
HX TOTAL PROTEIN: 5.3 g/dL — ABNORMAL LOW (ref 6.0–8.0)

## 2016-08-02 LAB — HX CBC W/ INDICES
CASE NUMBER: 2018029000091
HX HCT: 26.1 % — ABNORMAL LOW (ref 36.0–47.0)
HX HGB: 8.2 g/dL — ABNORMAL LOW (ref 11.8–15.8)
HX MCH: 33 pg — ABNORMAL HIGH (ref 27.0–31.0)
HX MCHC: 31.4 g/dL — ABNORMAL LOW (ref 32.0–36.0)
HX MCV: 105 fL — ABNORMAL HIGH (ref 81.0–99.0)
HX MPV: 9.5 fL — NL (ref 7.4–11.5)
HX NRBC PERCENT: 0 % — NL
HX PLATELET: 287 10*3/uL — NL (ref 150.0–400.0)
HX RBC: 2.48 10*6/uL — ABNORMAL LOW (ref 3.6–5.0)
HX RDW: 17.2 % — ABNORMAL HIGH (ref 11.5–14.5)
HX WBC: 6.2 10*3/uL — NL (ref 3.7–11.2)

## 2016-08-02 LAB — HX GLOMERULAR FILTRATION RATE (ESTIMATED)
CASE NUMBER: 2018029000091
HX AFN AMER GLOMERULAR FILTRATION RATE: >90
HX NON-AFN AMER GLOMERULAR FILTRATION RATE: >90

## 2016-08-03 LAB — HX HEPATIC FUNCTION PANEL
CASE NUMBER: 16117846
HX ALBUMIN LVL: 2.1 g/dL — ABNORMAL LOW (ref 3.5–5.0)
HX ALKALINE PHOSPHATASE: 286 U/L — ABNORMAL HIGH (ref 45.0–117.0)
HX ALT: 31 U/L — NL (ref 6.0–78.0)
HX AST: 24 U/L — NL (ref 6.0–40.0)
HX BILIRUBIN DIRECT: 1.3 mg/dL — ABNORMAL HIGH (ref 0.0–0.3)
HX BILIRUBIN TOTAL: 1.6 mg/dL — ABNORMAL HIGH (ref 0.2–1.2)
HX TOTAL PROTEIN: 5.5 g/dL — ABNORMAL LOW (ref 6.0–8.0)

## 2016-08-03 LAB — HX BASIC METABOLIC PANEL
CASE NUMBER: 16117847
HX ANION GAP: 9 — NL (ref 3.0–11.0)
HX BUN: 3 mg/dL — ABNORMAL LOW (ref 7.0–18.0)
HX CALCIUM LVL: 8.1 mg/dL — ABNORMAL LOW (ref 8.5–10.1)
HX CHLORIDE: 110 mmol/L — NL (ref 98.0–110.0)
HX CO2: 24 mmol/L — NL (ref 21.0–32.0)
HX CREATININE: 0.752 mg/dL — NL (ref 0.55–1.3)
HX GLUCOSE LVL: 109 mg/dL — NL (ref 65.0–110.0)
HX POTASSIUM LVL: 3.3 mmol/L — ABNORMAL LOW (ref 3.6–5.2)
HX SODIUM LVL: 143 mmol/L — NL (ref 136.0–145.0)

## 2016-08-03 LAB — HX CBC W/ INDICES
CASE NUMBER: 16117846
HX HCT: 29.2 % — ABNORMAL LOW (ref 36.0–47.0)
HX HGB: 9.3 g/dL — ABNORMAL LOW (ref 11.8–15.8)
HX MCH: 33 pg — ABNORMAL HIGH (ref 27.0–31.0)
HX MCHC: 31.8 g/dL — ABNORMAL LOW (ref 32.0–36.0)
HX MCV: 103 fL — ABNORMAL HIGH (ref 81.0–99.0)
HX MPV: 9.2 fL — NL (ref 7.4–11.5)
HX NRBC PERCENT: 0 % — NL
HX PLATELET: 417 10*3/uL — ABNORMAL HIGH (ref 150.0–400.0)
HX RBC: 2.84 10*6/uL — ABNORMAL LOW (ref 3.6–5.0)
HX RDW: 17.2 % — ABNORMAL HIGH (ref 11.5–14.5)
HX WBC: 7.5 10*3/uL — NL (ref 3.7–11.2)

## 2016-08-03 LAB — HX GLOMERULAR FILTRATION RATE (ESTIMATED)
CASE NUMBER: 2018030000575
HX AFN AMER GLOMERULAR FILTRATION RATE: >90
HX NON-AFN AMER GLOMERULAR FILTRATION RATE: 84 mL/min/{1.73_m2}

## 2016-08-03 LAB — HX MAGNESIUM LEVEL
CASE NUMBER: 16117846
HX MAGNESIUM LVL: 1.9 mg/dL — NL (ref 1.8–2.4)

## 2016-08-04 LAB — HX HEPATIC FUNCTION PANEL
CASE NUMBER: 2018031000118
HX ALBUMIN LVL: 1.9 g/dL — ABNORMAL LOW (ref 3.5–5.0)
HX ALKALINE PHOSPHATASE: 238 U/L — ABNORMAL HIGH (ref 45.0–117.0)
HX ALT: 23 U/L — NL (ref 6.0–78.0)
HX AST: 19 U/L — NL (ref 6.0–40.0)
HX BILIRUBIN DIRECT: 1.1 mg/dL — ABNORMAL HIGH (ref 0.0–0.3)
HX BILIRUBIN TOTAL: 1.5 mg/dL — ABNORMAL HIGH (ref 0.2–1.2)
HX TOTAL PROTEIN: 5.5 g/dL — ABNORMAL LOW (ref 6.0–8.0)

## 2016-08-04 LAB — HX CBC W/ INDICES
CASE NUMBER: 2018031000118
HX HCT: 28.3 % — ABNORMAL LOW (ref 36.0–47.0)
HX HGB: 9 g/dL — ABNORMAL LOW (ref 11.8–15.8)
HX MCH: 33 pg — ABNORMAL HIGH (ref 27.0–31.0)
HX MCHC: 31.8 g/dL — ABNORMAL LOW (ref 32.0–36.0)
HX MCV: 103 fL — ABNORMAL HIGH (ref 81.0–99.0)
HX MPV: 9 fL — NL (ref 7.4–11.5)
HX NRBC PERCENT: 0 % — NL
HX PLATELET: 375 10*3/uL — NL (ref 150.0–400.0)
HX RBC: 2.74 10*6/uL — ABNORMAL LOW (ref 3.6–5.0)
HX RDW: 17.4 % — ABNORMAL HIGH (ref 11.5–14.5)
HX WBC: 8 10*3/uL — NL (ref 3.7–11.2)

## 2016-08-04 LAB — HX BASIC METABOLIC PANEL
CASE NUMBER: 2018031000118
HX ANION GAP: 7 — NL (ref 3.0–11.0)
HX BUN: 2 mg/dL — ABNORMAL LOW (ref 7.0–18.0)
HX CALCIUM LVL: 7.9 mg/dL — ABNORMAL LOW (ref 8.5–10.1)
HX CHLORIDE: 109 mmol/L — NL (ref 98.0–110.0)
HX CO2: 25 mmol/L — NL (ref 21.0–32.0)
HX CREATININE: 0.712 mg/dL — NL (ref 0.55–1.3)
HX GLUCOSE LVL: 100 mg/dL — NL (ref 65.0–110.0)
HX POTASSIUM LVL: 3.9 mmol/L — NL (ref 3.6–5.2)
HX SODIUM LVL: 141 mmol/L — NL (ref 136.0–145.0)

## 2016-08-04 LAB — HX GLOMERULAR FILTRATION RATE (ESTIMATED)
CASE NUMBER: 2018031000118
HX AFN AMER GLOMERULAR FILTRATION RATE: >90
HX NON-AFN AMER GLOMERULAR FILTRATION RATE: 90 mL/min/{1.73_m2}

## 2016-08-04 LAB — HX MAGNESIUM LEVEL
CASE NUMBER: 2018031000118
HX MAGNESIUM LVL: 2 mg/dL — NL (ref 1.8–2.4)

## 2016-08-05 LAB — HX GLOMERULAR FILTRATION RATE (ESTIMATED)
CASE NUMBER: 2018032000122
HX AFN AMER GLOMERULAR FILTRATION RATE: >90
HX NON-AFN AMER GLOMERULAR FILTRATION RATE: 81 mL/min/{1.73_m2}

## 2016-08-05 LAB — HX BASIC METABOLIC PANEL
CASE NUMBER: 2018032000122
HX ANION GAP: 5 — NL (ref 3.0–11.0)
HX BUN: 3 mg/dL — ABNORMAL LOW (ref 7.0–18.0)
HX CALCIUM LVL: 8 mg/dL — ABNORMAL LOW (ref 8.5–10.1)
HX CHLORIDE: 108 mmol/L — NL (ref 98.0–110.0)
HX CO2: 26 mmol/L — NL (ref 21.0–32.0)
HX CREATININE: 0.779 mg/dL — NL (ref 0.55–1.3)
HX GLUCOSE LVL: 88 mg/dL — NL (ref 65.0–110.0)
HX POTASSIUM LVL: 3.6 mmol/L — NL (ref 3.6–5.2)
HX SODIUM LVL: 139 mmol/L — NL (ref 136.0–145.0)

## 2016-08-05 LAB — HX CBC W/ INDICES
CASE NUMBER: 2018032000122
HX HCT: 31.7 % — ABNORMAL LOW (ref 36.0–47.0)
HX HGB: 9.9 g/dL — ABNORMAL LOW (ref 11.8–15.8)
HX MCH: 33 pg — ABNORMAL HIGH (ref 27.0–31.0)
HX MCHC: 31.2 g/dL — ABNORMAL LOW (ref 32.0–36.0)
HX MCV: 107 fL — ABNORMAL HIGH (ref 81.0–99.0)
HX MPV: 9.1 fL — NL (ref 7.4–11.5)
HX NRBC PERCENT: 0 % — NL
HX PLATELET: 493 10*3/uL — ABNORMAL HIGH (ref 150.0–400.0)
HX RBC: 2.97 10*6/uL — ABNORMAL LOW (ref 3.6–5.0)
HX RDW: 17.2 % — ABNORMAL HIGH (ref 11.5–14.5)
HX WBC: 9.2 10*3/uL — NL (ref 3.7–11.2)

## 2016-08-05 LAB — HX HEPATIC FUNCTION PANEL
CASE NUMBER: 2018032000122
HX ALBUMIN LVL: 2 g/dL — ABNORMAL LOW (ref 3.5–5.0)
HX ALKALINE PHOSPHATASE: 248 U/L — ABNORMAL HIGH (ref 45.0–117.0)
HX ALT: 21 U/L — NL (ref 6.0–78.0)
HX AST: 21 U/L — NL (ref 6.0–40.0)
HX BILIRUBIN DIRECT: 1.1 mg/dL — ABNORMAL HIGH (ref 0.0–0.3)
HX BILIRUBIN TOTAL: 1.5 mg/dL — ABNORMAL HIGH (ref 0.2–1.2)
HX TOTAL PROTEIN: 6.2 g/dL — NL (ref 6.0–8.0)

## 2016-08-05 LAB — HX MAGNESIUM LEVEL
CASE NUMBER: 2018032000122
HX MAGNESIUM LVL: 2.1 mg/dL — NL (ref 1.8–2.4)

## 2016-08-06 LAB — HX HEPATIC FUNCTION PANEL
CASE NUMBER: 2018033000123
HX ALBUMIN LVL: 1.9 g/dL — ABNORMAL LOW (ref 3.5–5.0)
HX ALKALINE PHOSPHATASE: 213 U/L — ABNORMAL HIGH (ref 45.0–117.0)
HX ALT: 16 U/L — NL (ref 6.0–78.0)
HX AST: 17 U/L — NL (ref 6.0–40.0)
HX BILIRUBIN DIRECT: 0.9 mg/dL — ABNORMAL HIGH (ref 0.0–0.3)
HX BILIRUBIN TOTAL: 1.2 mg/dL — NL (ref 0.2–1.2)
HX TOTAL PROTEIN: 5.7 g/dL — ABNORMAL LOW (ref 6.0–8.0)

## 2016-08-06 LAB — HX BASIC METABOLIC PANEL
CASE NUMBER: 2018033000123
HX ANION GAP: 8 — NL (ref 3.0–11.0)
HX BUN: 4 mg/dL — ABNORMAL LOW (ref 7.0–18.0)
HX CALCIUM LVL: 7.7 mg/dL — ABNORMAL LOW (ref 8.5–10.1)
HX CHLORIDE: 110 mmol/L — NL (ref 98.0–110.0)
HX CO2: 25 mmol/L — NL (ref 21.0–32.0)
HX CREATININE: 0.714 mg/dL — NL (ref 0.55–1.3)
HX GLUCOSE LVL: 92 mg/dL — NL (ref 65.0–110.0)
HX POTASSIUM LVL: 3.5 mmol/L — ABNORMAL LOW (ref 3.6–5.2)
HX SODIUM LVL: 143 mmol/L — NL (ref 136.0–145.0)

## 2016-08-06 LAB — HX CBC W/ INDICES
CASE NUMBER: 2018033000123
HX HCT: 27.9 % — ABNORMAL LOW (ref 36.0–47.0)
HX HGB: 9 g/dL — ABNORMAL LOW (ref 11.8–15.8)
HX MCH: 34 pg — ABNORMAL HIGH (ref 27.0–31.0)
HX MCHC: 32.3 g/dL — NL (ref 32.0–36.0)
HX MCV: 105 fL — ABNORMAL HIGH (ref 81.0–99.0)
HX MPV: 9 fL — NL (ref 7.4–11.5)
HX NRBC PERCENT: 0 % — NL
HX PLATELET: 417 10*3/uL — ABNORMAL HIGH (ref 150.0–400.0)
HX RBC: 2.65 10*6/uL — ABNORMAL LOW (ref 3.6–5.0)
HX RDW: 17 % — ABNORMAL HIGH (ref 11.5–14.5)
HX WBC: 11.9 10*3/uL — ABNORMAL HIGH (ref 3.7–11.2)

## 2016-08-06 LAB — HX GLOMERULAR FILTRATION RATE (ESTIMATED)
CASE NUMBER: 2018033000123
HX AFN AMER GLOMERULAR FILTRATION RATE: >90
HX NON-AFN AMER GLOMERULAR FILTRATION RATE: 90 mL/min/{1.73_m2}

## 2016-08-06 LAB — HX SURG PATH FINAL REPORT
CASE NUMBER: 0
HX NOTE: 88112

## 2016-08-06 LAB — HX MAGNESIUM LEVEL
CASE NUMBER: 2018033000123
HX MAGNESIUM LVL: 2.2 mg/dL — NL (ref 1.8–2.4)

## 2016-08-07 LAB — HX MAGNESIUM LEVEL
CASE NUMBER: 2018034000131
HX MAGNESIUM LVL: 2.1 mg/dL — NL (ref 1.8–2.4)

## 2016-08-07 LAB — HX BASIC METABOLIC PANEL
CASE NUMBER: 2018034000131
HX ANION GAP: 7 — NL (ref 3.0–11.0)
HX BUN: 4 mg/dL — ABNORMAL LOW (ref 7.0–18.0)
HX CALCIUM LVL: 7.5 mg/dL — ABNORMAL LOW (ref 8.5–10.1)
HX CHLORIDE: 113 mmol/L — ABNORMAL HIGH (ref 98.0–110.0)
HX CO2: 25 mmol/L — NL (ref 21.0–32.0)
HX CREATININE: 0.719 mg/dL — NL (ref 0.55–1.3)
HX GLUCOSE LVL: 73 mg/dL — NL (ref 65.0–110.0)
HX POTASSIUM LVL: 4 mmol/L — NL (ref 3.6–5.2)
HX SODIUM LVL: 145 mmol/L — NL (ref 136.0–145.0)

## 2016-08-07 LAB — HX CBC W/ INDICES
CASE NUMBER: 2018034000131
HX HCT: 26.7 % — ABNORMAL LOW (ref 36.0–47.0)
HX HGB: 8.5 g/dL — ABNORMAL LOW (ref 11.8–15.8)
HX MCH: 34 pg — ABNORMAL HIGH (ref 27.0–31.0)
HX MCHC: 31.8 g/dL — ABNORMAL LOW (ref 32.0–36.0)
HX MCV: 107 fL — ABNORMAL HIGH (ref 81.0–99.0)
HX MPV: 9.1 fL — NL (ref 7.4–11.5)
HX NRBC PERCENT: 0 % — NL
HX PLATELET: 420 10*3/uL — ABNORMAL HIGH (ref 150.0–400.0)
HX RBC: 2.5 10*6/uL — ABNORMAL LOW (ref 3.6–5.0)
HX RDW: 17.2 % — ABNORMAL HIGH (ref 11.5–14.5)
HX WBC: 8.4 10*3/uL — NL (ref 3.7–11.2)

## 2016-08-07 LAB — HX GLOMERULAR FILTRATION RATE (ESTIMATED)
CASE NUMBER: 2018034000131
HX AFN AMER GLOMERULAR FILTRATION RATE: >90
HX NON-AFN AMER GLOMERULAR FILTRATION RATE: 89 mL/min/{1.73_m2}

## 2016-08-07 LAB — HX HEPATIC FUNCTION PANEL
CASE NUMBER: 2018034000131
HX ALBUMIN LVL: 1.8 g/dL — ABNORMAL LOW (ref 3.5–5.0)
HX ALKALINE PHOSPHATASE: 194 U/L — ABNORMAL HIGH (ref 45.0–117.0)
HX ALT: 13 U/L — NL (ref 6.0–78.0)
HX AST: 18 U/L — NL (ref 6.0–40.0)
HX BILIRUBIN DIRECT: 0.9 mg/dL — ABNORMAL HIGH (ref 0.0–0.3)
HX BILIRUBIN TOTAL: 1.1 mg/dL — NL (ref 0.2–1.2)
HX TOTAL PROTEIN: 5 g/dL — ABNORMAL LOW (ref 6.0–8.0)

## 2016-08-08 LAB — HX HEPATIC FUNCTION PANEL
CASE NUMBER: 2018035000123
HX ALBUMIN LVL: 1.8 g/dL — ABNORMAL LOW (ref 3.5–5.0)
HX ALKALINE PHOSPHATASE: 170 U/L — ABNORMAL HIGH (ref 45.0–117.0)
HX ALT: 14 U/L — NL (ref 6.0–78.0)
HX AST: 17 U/L — NL (ref 6.0–40.0)
HX BILIRUBIN DIRECT: 0.7 mg/dL — ABNORMAL HIGH (ref 0.0–0.3)
HX BILIRUBIN TOTAL: 0.9 mg/dL — NL (ref 0.2–1.2)
HX TOTAL PROTEIN: 5.2 g/dL — ABNORMAL LOW (ref 6.0–8.0)

## 2016-08-08 LAB — HX CBC W/ INDICES
CASE NUMBER: 2018035000123
HX HCT: 26.5 % — ABNORMAL LOW (ref 36.0–47.0)
HX HGB: 8.4 g/dL — ABNORMAL LOW (ref 11.8–15.8)
HX MCH: 33 pg — ABNORMAL HIGH (ref 27.0–31.0)
HX MCHC: 31.7 g/dL — ABNORMAL LOW (ref 32.0–36.0)
HX MCV: 105 fL — ABNORMAL HIGH (ref 81.0–99.0)
HX MPV: 8.7 fL — NL (ref 7.4–11.5)
HX NRBC PERCENT: 0 % — NL
HX PLATELET: 360 10*3/uL — NL (ref 150.0–400.0)
HX RBC: 2.52 10*6/uL — ABNORMAL LOW (ref 3.6–5.0)
HX RDW: 17.3 % — ABNORMAL HIGH (ref 11.5–14.5)
HX WBC: 6.6 10*3/uL — NL (ref 3.7–11.2)

## 2016-08-08 LAB — HX GLOMERULAR FILTRATION RATE (ESTIMATED)
CASE NUMBER: 2018035000123
HX AFN AMER GLOMERULAR FILTRATION RATE: >90
HX NON-AFN AMER GLOMERULAR FILTRATION RATE: 84 mL/min/{1.73_m2}

## 2016-08-08 LAB — HX MAGNESIUM LEVEL
CASE NUMBER: 2018035000123
HX MAGNESIUM LVL: 2.1 mg/dL — NL (ref 1.8–2.4)

## 2016-08-08 LAB — HX BASIC METABOLIC PANEL
CASE NUMBER: 2018035000123
HX ANION GAP: 6 — NL (ref 3.0–11.0)
HX BUN: 5 mg/dL — ABNORMAL LOW (ref 7.0–18.0)
HX CALCIUM LVL: 7.7 mg/dL — ABNORMAL LOW (ref 8.5–10.1)
HX CHLORIDE: 113 mmol/L — ABNORMAL HIGH (ref 98.0–110.0)
HX CO2: 24 mmol/L — NL (ref 21.0–32.0)
HX CREATININE: 0.753 mg/dL — NL (ref 0.55–1.3)
HX GLUCOSE LVL: 79 mg/dL — NL (ref 65.0–110.0)
HX POTASSIUM LVL: 3.7 mmol/L — NL (ref 3.6–5.2)
HX SODIUM LVL: 143 mmol/L — NL (ref 136.0–145.0)

## 2016-08-09 LAB — HX CBC W/ INDICES
CASE NUMBER: 16140194
HX HCT: 29.4 % — ABNORMAL LOW (ref 36.0–47.0)
HX HGB: 9.5 g/dL — ABNORMAL LOW (ref 11.8–15.8)
HX MCH: 34 pg — ABNORMAL HIGH (ref 27.0–31.0)
HX MCHC: 32.3 g/dL — NL (ref 32.0–36.0)
HX MCV: 104 fL — ABNORMAL HIGH (ref 81.0–99.0)
HX MPV: 9 fL — NL (ref 7.4–11.5)
HX NRBC PERCENT: 0 % — NL
HX PLATELET: 450 10*3/uL — ABNORMAL HIGH (ref 150.0–400.0)
HX RBC: 2.84 10*6/uL — ABNORMAL LOW (ref 3.6–5.0)
HX RDW: 17 % — ABNORMAL HIGH (ref 11.5–14.5)
HX WBC: 8.5 10*3/uL — NL (ref 3.7–11.2)

## 2016-08-09 LAB — HX GLOMERULAR FILTRATION RATE (ESTIMATED)
CASE NUMBER: 2018036000800
HX AFN AMER GLOMERULAR FILTRATION RATE: 87 mL/min/{1.73_m2}
HX NON-AFN AMER GLOMERULAR FILTRATION RATE: 75 mL/min/{1.73_m2}

## 2016-08-09 LAB — HX MAGNESIUM LEVEL
CASE NUMBER: 16140194
HX MAGNESIUM LVL: 2.2 mg/dL — NL (ref 1.8–2.4)

## 2016-08-09 LAB — HX BASIC METABOLIC PANEL
CASE NUMBER: 16140194
HX ANION GAP: 7 — NL (ref 3.0–11.0)
HX BUN: 6 mg/dL — ABNORMAL LOW (ref 7.0–18.0)
HX CALCIUM LVL: 8 mg/dL — ABNORMAL LOW (ref 8.5–10.1)
HX CHLORIDE: 112 mmol/L — ABNORMAL HIGH (ref 98.0–110.0)
HX CO2: 22 mmol/L — NL (ref 21.0–32.0)
HX CREATININE: 0.824 mg/dL — NL (ref 0.55–1.3)
HX GLUCOSE LVL: 81 mg/dL — NL (ref 65.0–110.0)
HX POTASSIUM LVL: 4.1 mmol/L — NL (ref 3.6–5.2)
HX SODIUM LVL: 141 mmol/L — NL (ref 136.0–145.0)

## 2016-08-09 LAB — HX HEPATIC FUNCTION PANEL
CASE NUMBER: 16140194
HX ALBUMIN LVL: 2.1 g/dL — ABNORMAL LOW (ref 3.5–5.0)
HX ALKALINE PHOSPHATASE: 193 U/L — ABNORMAL HIGH (ref 45.0–117.0)
HX ALT: 17 U/L — NL (ref 6.0–78.0)
HX AST: 23 U/L — NL (ref 6.0–40.0)
HX BILIRUBIN DIRECT: 0.8 mg/dL — ABNORMAL HIGH (ref 0.0–0.3)
HX BILIRUBIN TOTAL: 1 mg/dL — NL (ref 0.2–1.2)
HX TOTAL PROTEIN: 5.6 g/dL — ABNORMAL LOW (ref 6.0–8.0)

## 2016-08-10 LAB — HX CBC W/ INDICES
CASE NUMBER: 2018037000127
HX HCT: 27.4 % — ABNORMAL LOW (ref 36.0–47.0)
HX HGB: 8.8 g/dL — ABNORMAL LOW (ref 11.8–15.8)
HX MCH: 34 pg — ABNORMAL HIGH (ref 27.0–31.0)
HX MCHC: 32.1 g/dL — NL (ref 32.0–36.0)
HX MCV: 105 fL — ABNORMAL HIGH (ref 81.0–99.0)
HX MPV: 9.2 fL — NL (ref 7.4–11.5)
HX NRBC PERCENT: 0 % — NL
HX PLATELET: 395 10*3/uL — NL (ref 150.0–400.0)
HX RBC: 2.62 10*6/uL — ABNORMAL LOW (ref 3.6–5.0)
HX RDW: 17.2 % — ABNORMAL HIGH (ref 11.5–14.5)
HX WBC: 6.6 10*3/uL — NL (ref 3.7–11.2)

## 2016-08-10 LAB — HX HEPATIC FUNCTION PANEL
CASE NUMBER: 2018037000127
HX ALBUMIN LVL: 1.7 g/dL — ABNORMAL LOW (ref 3.5–5.0)
HX ALKALINE PHOSPHATASE: 170 U/L — ABNORMAL HIGH (ref 45.0–117.0)
HX ALT: 15 U/L — NL (ref 6.0–78.0)
HX AST: 21 U/L — NL (ref 6.0–40.0)
HX BILIRUBIN DIRECT: 0.5 mg/dL — ABNORMAL HIGH (ref 0.0–0.3)
HX BILIRUBIN TOTAL: 0.9 mg/dL — NL (ref 0.2–1.2)
HX TOTAL PROTEIN: 5.5 g/dL — ABNORMAL LOW (ref 6.0–8.0)

## 2016-08-10 LAB — HX MAGNESIUM LEVEL
CASE NUMBER: 2018037000127
HX MAGNESIUM LVL: 2.2 mg/dL — NL (ref 1.8–2.4)

## 2016-08-10 LAB — HX BASIC METABOLIC PANEL
CASE NUMBER: 2018037000127
HX ANION GAP: 9 — NL (ref 3.0–11.0)
HX BUN: 5 mg/dL — ABNORMAL LOW (ref 7.0–18.0)
HX CALCIUM LVL: 7.8 mg/dL — ABNORMAL LOW (ref 8.5–10.1)
HX CHLORIDE: 114 mmol/L — ABNORMAL HIGH (ref 98.0–110.0)
HX CO2: 22 mmol/L — NL (ref 21.0–32.0)
HX CREATININE: 0.702 mg/dL — NL (ref 0.55–1.3)
HX GLUCOSE LVL: 75 mg/dL — NL (ref 65.0–110.0)
HX POTASSIUM LVL: 3.7 mmol/L — NL (ref 3.6–5.2)
HX SODIUM LVL: 145 mmol/L — NL (ref 136.0–145.0)

## 2016-08-10 LAB — HX GLOMERULAR FILTRATION RATE (ESTIMATED)
CASE NUMBER: 2018037000127
HX AFN AMER GLOMERULAR FILTRATION RATE: >90
HX NON-AFN AMER GLOMERULAR FILTRATION RATE: >90

## 2016-08-11 LAB — HX GLOMERULAR FILTRATION RATE (ESTIMATED)
CASE NUMBER: 2018038000143
HX AFN AMER GLOMERULAR FILTRATION RATE: >90
HX NON-AFN AMER GLOMERULAR FILTRATION RATE: 81 mL/min/{1.73_m2}

## 2016-08-11 LAB — HX CBC W/ INDICES
CASE NUMBER: 2018038000143
HX HCT: 26.8 % — ABNORMAL LOW (ref 36.0–47.0)
HX HGB: 8.6 g/dL — ABNORMAL LOW (ref 11.8–15.8)
HX MCH: 34 pg — ABNORMAL HIGH (ref 27.0–31.0)
HX MCHC: 32.1 g/dL — NL (ref 32.0–36.0)
HX MCV: 106 fL — ABNORMAL HIGH (ref 81.0–99.0)
HX MPV: 9.2 fL — NL (ref 7.4–11.5)
HX NRBC PERCENT: 0 % — NL
HX PLATELET: 374 10*3/uL — NL (ref 150.0–400.0)
HX RBC: 2.54 10*6/uL — ABNORMAL LOW (ref 3.6–5.0)
HX RDW: 17 % — ABNORMAL HIGH (ref 11.5–14.5)
HX WBC: 7.4 10*3/uL — NL (ref 3.7–11.2)

## 2016-08-11 LAB — HX MAGNESIUM LEVEL
CASE NUMBER: 2018038000143
HX MAGNESIUM LVL: 2.2 mg/dL — NL (ref 1.8–2.4)

## 2016-08-11 LAB — HX HEPATIC FUNCTION PANEL
CASE NUMBER: 2018038000143
HX ALBUMIN LVL: 1.8 g/dL — ABNORMAL LOW (ref 3.5–5.0)
HX ALKALINE PHOSPHATASE: 156 U/L — ABNORMAL HIGH (ref 45.0–117.0)
HX ALT: 16 U/L — NL (ref 6.0–78.0)
HX AST: 23 U/L — NL (ref 6.0–40.0)
HX BILIRUBIN DIRECT: 0.5 mg/dL — ABNORMAL HIGH (ref 0.0–0.3)
HX BILIRUBIN TOTAL: 0.9 mg/dL — NL (ref 0.2–1.2)
HX TOTAL PROTEIN: 5.4 g/dL — ABNORMAL LOW (ref 6.0–8.0)

## 2016-08-11 LAB — HX BASIC METABOLIC PANEL
CASE NUMBER: 2018038000143
HX ANION GAP: 8 — NL (ref 3.0–11.0)
HX BUN: 8 mg/dL — NL (ref 7.0–18.0)
HX CALCIUM LVL: 7.7 mg/dL — ABNORMAL LOW (ref 8.5–10.1)
HX CHLORIDE: 113 mmol/L — ABNORMAL HIGH (ref 98.0–110.0)
HX CO2: 23 mmol/L — NL (ref 21.0–32.0)
HX CREATININE: 0.775 mg/dL — NL (ref 0.55–1.3)
HX GLUCOSE LVL: 72 mg/dL — NL (ref 65.0–110.0)
HX POTASSIUM LVL: 3.9 mmol/L — NL (ref 3.6–5.2)
HX SODIUM LVL: 144 mmol/L — NL (ref 136.0–145.0)

## 2016-08-26 NOTE — Progress Notes (Signed)
* * *    Leah, [name] **DOB:** 1951-01-06 (66 yo F) **Acc No.** [id number] **DOS:**  08/26/2016    ---        Leah [name]**    ------    66 Y old Female, DOB: 1950-09-09    Account Number: [id number]    [address], [name], [place], [id number]    Home: [phone]    Guarantor: Leah, [name] Insurance: [employer] Payer ID: [id number]    Appointment Facility: Nolon Bussing DO        * * *    08/26/2016    Progress Note: Leah [name], D.O.     ------    ---        **Current Medications**    ---    Taking     * Furosemide 40 MG Tablet TAKE 1 TABLET BY MOUTH DAILY     ---    * amLODIPine Besylate 10 MG Tablet TAKE 1 TABLET BY MOUTH DAILY     ---    * KlonoPIN 1 MG Tablet 1 tab in AM, 0.5 tab in Noon, and 1 tab in PM Orally daily     ---    * Amitriptyline HCl 50 MG Tablet 1 tablet Orally     ---    * Omeprazole 20 MG Capsule Delayed Release TAKE ONE CAPSULE BY MOUTH TWICE A DAY     ---    * hydrOXYzine HCl 25 MG Tablet 1 tablet as needed Orally every 8 hrs     ---    * Furosemide 40 MG Tablet 1 tablet Orally Once a day     ---    * KlonoPIN 1 MG Tablet 1 tablet Orally Three a day     ---    * [facility] D3 1000 UNIT Capsule TAKE ONE CAPSULE BY MOUTH EVERY DAY     ---    * Breo Ellipta 100-25 MCG/INH Aerosol Powder Breath Activated 1 puff Inhalation Once a day     ---    * Ferrous Gluconate 325 (36 Fe) MG Tablet 1 tablet Orally Twice a day     ---    * Furosemide 80 MG Tablet 1 tablet Orally Once a day     ---    * Latuda 20 MG Tablet 1 tablet Orally Once a day     ---    * Cymbalta 60 MG Capsule Delayed Release Particles 1 capsule Orally Once a day     ---    * Lisinopril 40 MG Tablet 1 tablet Orally Once a day     ---    * Vitamin D 1000 UNIT Tablet 1 tablet Orally Once a day     ---    * tiZANidine HCl 4 MG Tablet 1 tablet as needed Orally Three to four times a day     ---    * oxyCODONE HCl 10 MG Tablet 1 tablet Orally Twice a day     ---    * Atenolol 50 MG Tablet 1 tablet  Orally Twice a day     ---    Not-Taking    * Abilify 15 MG Tablet 1 TAB Orally daily     ---    * Pravastatin Sodium 40MG  1 TAB ORAL daily     ---    * Omeprazole 20MG  Tablet Delayed Release 1 TAB ORAL twice daily     ---    * Albuterol Sulfate (  2.5 MG/3ML) 0.083% Nebulization Solution 3 ml Inhalation Three times a day     ---    * Advair Diskus 500-50 MCG/DOSE Aerosol Powder Breath Activated INHALE 1 PUFF TWICE A DAY     ---    * Abilify 10mg  tablet TAKE 1 TABLET BY MOUTH ONCE A DAY     ---    * Spiriva HandiHaler 18 MCG 1 CAP Inhalation daily     ---    * ARIPiprazole 15 MG Tablet TAKE 1 TABLET BY MOUTH EVERY DAY     ---    * Zanaflex 4MG  1 TAB ORAL 2-3 times daily     ---        **Assessments**    ---    1. Hypertension - I10 (Primary)    ---    2. Hyperlipidemia - E78.5    ---    3. Fe deficiency anemia - D50.9    ---    4. B12 deficiency - E53.8    ---    5. Hyperglycemia - R73.9    ---    6. Vitamin D deficiency, unspecified - E55.9    ---    7. Fatigue, unspecified type - R53.83    ---      **Treatment**    ---      **1. Hypertension**    Notes:  High Blood Pressure: Care Instructions material was printed    ---    Electronically signed by Nolon Bussing , DO on 11/20/2022 at 03:13 PM  CDT    Sign off status: Completed        * * *        Nolon Bussing DO    [address]    [address]    [place], [place] [address]    Tel: [phone]    Fax: [phone]              * * *          Progress Note: Cherre Robins, D.O. 08/26/2016    ---    Note generated by eClinicalWorks EMR/PM Software ([email or web address])

## 2016-09-20 NOTE — Progress Notes (Signed)
* * *        **Leah [name] [name]**    ------    66 Y old Female, DOB: [birth year]    Account Number: [id number]    [address] [place], [id number]    Home: [phone]    Guarantor: Leah [name] [name] Insurance: [facility] Complete-[employer]    Appointment Facility: Nolon Bussing DO        * * *    09/20/2016    Progress Notes: Leah [name] [name], D.O.     ------    ---        **Reason for Appointment**    ---      1. Follow up    ---      **History of Present Illness**    ---    _Constitutional_ :    Leah [name] [name] presents today for a follow up of the swelling in her legs, for which  she has been taking Furosemide. Patient has no other concerns or complaints  today.      **Current Medications**    ---    Taking     * Furosemide 40 MG Tablet TAKE 1 TABLET BY MOUTH DAILY     ---    * Klonopin 1 MG Tablet 1 tab in AM, 0.5 tab in Noon, and 1 tab in PM Orally daily     ---    * Amitriptyline HCl 50 MG Tablet 1 tablet Orally     ---    * HydrOXYzine HCl 25 MG Tablet 1 tablet as needed Orally every 8 hrs     ---    * Klonopin 1 MG Tablet 1 tablet Orally Three a day     ---    * CVS D3 1000 UNIT Capsule TAKE ONE CAPSULE BY MOUTH EVERY DAY     ---    * Ferrous Gluconate 325 (36 Fe) MG Tablet 1 tablet Orally Twice a day     ---    * Furosemide 80 MG Tablet 1 tablet Orally Once a day     ---    * Latuda 20 MG Tablet 1 tablet Orally Once a day     ---    * Cymbalta 60 MG Capsule Delayed Release Particles 1 capsule Orally Once a day     ---    * Lisinopril 40 MG Tablet 1 tablet Orally Once a day     ---    * Vitamin D 1000 UNIT Tablet 1 tablet Orally Once a day     ---    * Tizanidine HCl 4 MG Tablet 1 tablet as needed Orally Three to four times a day     ---    * Atenolol 50 MG Tablet 1 tablet Orally Twice a day     ---    * Omeprazole 20 MG Capsule Delayed Release TAKE ONE CAPSULE BY MOUTH TWICE A DAY     ---    * Breo Ellipta 100-25 MCG/INH Aerosol Powder Breath Activated 1 puff  Inhalation Once a day     ---    * Amlodipine Besylate 10 MG Tablet TAKE 1 TABLET BY MOUTH DAILY     ---    * Oxycodone HCl 10 MG Tablet 1 tablet Orally Twice a day     ---    Not-Taking    * Abilify 15 MG Tablet 1 TAB Orally daily     ---    * Pravastatin Sodium  40MG  1 TAB ORAL daily     ---    * Omeprazole 20MG  Tablet Delayed Release 1 TAB ORAL twice daily     ---    * Albuterol Sulfate (2.5 MG/3ML) 0.083% Nebulization Solution 3 ml Inhalation Three times a day     ---    * Advair Diskus 500-50 MCG/DOSE Aerosol Powder Breath Activated INHALE 1 PUFF TWICE A DAY     ---    * Abilify 10mg  tablet TAKE 1 TABLET BY MOUTH ONCE A DAY     ---    * Spiriva HandiHaler 18 MCG 1 CAP Inhalation daily     ---    * Aripiprazole 15 MG Tablet TAKE 1 TABLET BY MOUTH EVERY DAY     ---    * Zanaflex 4MG  1 TAB ORAL 2-3 times daily     ---    * Medication List reviewed and reconciled with the patient     ---      **Past Medical History**    ---      COPD.        ---    Bipolar.        ---    Substance abuse.        ---    Encephalopthy.        ---    Nondependent tobacco use disorder.        ---    Other and unspecified hyperlipidemia.        ---    Essential hypertension, benign.        ---    Lumbago.        ---    Esophageal reflux.        ---    Dietary surveillance and counseling.        ---    Exercise counseling.        ---    Other counseling, not elsewhere classified.        ---    Overweight.        ---    Hypertension.        ---    Hyperlipidemia.        ---    Other abnormal glucose.        ---    Fatigue.        ---    Vitamin D deficiency.        ---    Personal history of tobacco use, presenting hazards to health.        ---    COPD-Stable.        ---    Cough.        ---    Cough.        ---      **Surgical History**    ---      Denies Past Surgical History    ---      **Family History**    ---      Father: deceased, lung cancer    ---    Mother: deceased, bone cancer    ---    1 brother(s) , 1 sister(s) -  healthy.    ---    husband passed 10/02/14-heart attack 53yo.    ---      **Social History**    ---    _Quality 2017:_    Tobacco Use/Smoking    Are you a _current smoker_    How often do you smoke cigarettes? _some days,  but not every day_    How many cigarettes a day do you smoke? _11-20_    How soon after you wake up do you smoke your first cigarette? _within 5  minutes_    Are you interested in quitting? _Thinking about quitting_    Depression Evaluation (PHQ-9)    Little interest or pleasure in doing things _Nearly every day_    Feeling down, depressed, or hopeless _Nearly every day_    Trouble falling or staying asleep, or sleeping too much _Nearly every day_    Feeling tired or having little energy _Nearly every day_    Poor appetite or overeating _Several days_    Feeling bad about yourself, or that you are a failure, or have let yourself or  your family down _Not at all_    Trouble concentrating on things, such as reading the newspaper or watching  television _Nearly every day_    Moving or speaking so slowly that other people could have noticed. Or the  opposite, being so fidgety or restless that you are moving around a lot more  than usual _Not at all_    Thoughts that you would be better off dead, or of hurting yourself in some way  _Not at all_    Total Score: _16_    Interpretation: _Moderately severe depression_    Depression Screening (PHQ-2)    Little interest or pleasure in doing things _Yes_    Feeling down, depressed, or hopeless _Yes_    Information Last Updated    Date: _03/19/2018_      **Allergies**    ---      N.K.D.A.    ---      **Hospitalization/Major Diagnostic Procedure**    ---      Lurlean Nanny - fall, altered mental status injury of head 09/17/2014    ---      **Review of Systems**    ---    _General/Constitutional_ :    Patient denies  change in appetite  ,  chills  ,  fatigue  ,  fever  ,  headache  ,  lightheadedness  ,  sleep disturbance  ,  weight gain  ,  weight  loss  .     _Ophthalmologic_ :    Patient denies  blurred vision  ,  diminished visual acuity  ,  discharge  ,  dry eye  ,  eye pain  ,  flashes of light in the visual field  ,  floaters in  the visual field  ,  itching and redness  ,  red eye  .    _ENT_ :    Patient denies  blocked ear(s)  ,  decreased hearing  ,  decreased sense of  smell  ,  difficulty swallowing  ,  dry mouth  ,  ear pain, snoring,  sore  throat  ,  swollen glands  .    _Endocrine_ :    Patient denies  acne  ,  cold intolerance  ,  difficulty sleeping  ,  dizziness  ,  excessive sweating  ,  excessive thirst  ,  frequent urination  ,  hair loss  ,  heat intolerance  ,  weakness  ,  weight loss  .    _Respiratory_ :    Patient denies  asthma, breathing problems  ,  chest pain  ,  cough  ,  hemoptysis  ,  pain with inspiration  ,  shortness of  breath  ,  shortness of  breath at rest  ,  shortness of breath with exertion  ,  sputum production  ,  wheezing  .    _Cardiovascular_ :    Patient denies  chest pain at rest  ,  chest pain with exertion  ,  claudication  ,  cyanosis  ,  difficulty laying flat  ,  dizziness  ,  dyspnea  on exertion  ,  fluid accumulation in the legs  ,  irregular heartbeat  ,  orthopnea  ,  palpitations  ,  rheumatic fever  ,  swelling in hands  ,  weakness  ,  weight gain  . Patient complaining of  swelling in feet  .    _Gastrointestinal_ :    Patient denies  abdominal pain  ,  blood in stool  ,  change in bowel habits  ,  constipation  ,  decreased appetite  ,  diarrhea  ,  difficulty swallowing  ,  heartburn  ,  nausea  ,  rectal bleeding  ,  vomiting  ,  weight loss  .    _Genitourinary_ :    Patient denies  abdominal pain/swelling  ,  blood in the urine  ,  difficulty  urinating  ,  frequent urination  ,  pain in lower back  ,  painful urination  .    _Skin_ :    Patient denies  acne  ,  blistering of skin  ,  discoloration  ,  dry skin  ,  eczema  ,  excessive sun exposure  ,  hair changes  ,  hives  ,  itching  ,  masses  ,   mole(s)  ,  nail changes  ,  nodule(s)  ,  photosensitivity  ,  rash  ,  scaly lesions of skin/scalp  ,  skin lesion(s)  ,  skin oozing  .    _Neurologic_ :    Patient denies  balance difficulty  ,  difficulty speaking  ,  dizziness  ,  fainting  ,  gait abnormality  ,  headache  ,  irritability  ,  loss of  strength  ,  loss of use of extremity  ,  low back pain  ,  memory loss  ,  pain  ,  paralysis  ,  seizures  ,  stroke  ,  tics  ,  tingling/numbness  ,  transient loss of vision  ,  tremor  .    _Psychiatric_ :    Patient denies  anxiety  ,  depressed mood  ,  difficulty sleeping  ,  loss of  appetite  ,  stressors  ,  substance abuse  ,  suicidal thoughts  .          **Vital Signs**    ---    Ht 61 in, BP 102/60 mm Hg, HR  112 /min  , Oxygen sat % 97 %, Ht-cm 154.94 cm.      **Examination**    ---    _General Examination_ :    NECK/THYROID:  normal  ,  carotid pulse normal  ,  neck supple, full range of  motion  ,  no carotid bruit  ,  no cervical lymphadenopathy  ,  no jugular  venous distention  ,  no lymphadenopathy  ,  no thyroid nodules  ,  no  thyromegaly  ,  normal carotid upstroke  ,  normal jugular venous distention  ,  thyroid nontender  ,  thyroid normal  ,  trachea midline  .    HEART:  normal other than 1-2/6 murmur  ,  no clicks  ,  no jugular venous  distention  ,  no rubs, gallops  ,  point of maximal impulse normal  ,  regular rate and rhythm  .    LUNGS:  normal  ,  clear anteriorly and posteriorly  ,  clear to auscultation  bilaterally  ,  good air movement  ,  no wheezes, rales, rhonchi  .    CHEST:  normal  ,  axillary nodes normal  ,  clear to auscultation and  percussion  ,  no costochondral tenderness  ,  no gross rib deformity  ,  normal anteroposterior (AP) diameter  ,  normal shape and expansion  .    EXTREMITIES:  Trace Edema bilaterally  .          **Assessments**    ---    1. Peripheral edema - R60.9 (Primary)    ---    2. Hypertension - I10    ---    3. Fe deficiency anemia -  D50.9    ---    4. B12 deficiency - E53.8    ---    5. Hyperlipidemia - E78.5    ---    6. Hyperglycemia - R73.9    ---    7. Fatigue, unspecified type - R53.83    ---      **Treatment**    ---      **1. Peripheral edema**    Notes:  Patient presented today for a follow up of her peripheral edema.  Today, her legs look good. There is trace edema bilaterally, which is much  better than it was before at 2+ pitting edema. Patient was previously taking  Furosemide 40mg  everyday except 80mg  on Monday and Friday but is now down to  Furosemide 40mg  QD. It is fine for her to stay on that dose for now as we  continue to monitor her.  .    ---        **2. Hypertension**    _LAB: TSH_    _LAB: COMPREHENSIVE METABOLIC PANEL_    _LAB: LIPID PANEL_    _LAB: CBC_    _LAB: VITAMIN D,25OH_    _LAB: VITAMIN B12_    _LAB: HEMOGLOBIN A1C_    _LAB: IRON_        **3. Fe deficiency anemia**    _LAB: TSH_    _LAB: COMPREHENSIVE METABOLIC PANEL_    _LAB: LIPID PANEL_    _LAB: CBC_    _LAB: VITAMIN D,25OH_    _LAB: VITAMIN B12_    _LAB: HEMOGLOBIN A1C_    _LAB: IRON_        **4. B12 deficiency**    _LAB: TSH_    _LAB: COMPREHENSIVE METABOLIC PANEL_    _LAB: LIPID PANEL_    _LAB: CBC_    _LAB: VITAMIN D,25OH_    _LAB: VITAMIN B12_    _LAB: HEMOGLOBIN A1C_    _LAB: IRON_        **5. Hyperlipidemia**    _LAB: TSH_    _LAB: COMPREHENSIVE METABOLIC PANEL_    _LAB: LIPID PANEL_    _LAB: CBC_    _LAB: VITAMIN D,25OH_    _LAB: VITAMIN B12_    _LAB: HEMOGLOBIN A1C_    _LAB: IRON_        **  6. Hyperglycemia**    _LAB: TSH_    _LAB: COMPREHENSIVE METABOLIC PANEL_    _LAB: LIPID PANEL_    _LAB: CBC_    _LAB: VITAMIN D,25OH_    _LAB: VITAMIN B12_    _LAB: HEMOGLOBIN A1C_    _LAB: IRON_        **7. Fatigue, unspecified type**    _LAB: TSH_    _LAB: COMPREHENSIVE METABOLIC PANEL_    _LAB: LIPID PANEL_    _LAB: CBC_    _LAB: VITAMIN D,25OH_    _LAB: VITAMIN B12_    _LAB: HEMOGLOBIN A1C_    _LAB: IRON_        **8. Others**    Notes:  Patient has been given  a lab slip to get blood work done  .      **Follow Up**    ---    prn    Electronically signed by Nolon Bussing , DO on 01/08/2018 at 01:23 PM  CDT    Sign off status: Completed        * * *        Nolon Bussing DO    [address]    [place], [place] [id number]    Tel: [phone]    Fax: [phone]              * * *          Patient: Leah [name] [name], Leah [name] [name] DOB: 09-05-1950 Progress Note: Leah [name] [name], D.O. 09/20/2016    ---    Note generated by eClinicalWorks EMR/PM Software ([email or web address])

## 2016-11-10 NOTE — Progress Notes (Signed)
* * *        **Leah [name]**    ------    66 Y old Female, DOB: October 20, 1950    Account Number: [id number]    [address] [place], [id number]    Home: [phone]    Guarantor: Leah [name] Insurance: [facility] Complete-[employer]    Appointment Facility: Nolon Bussing DO        * * *    11/10/2016    Progress Notes: Cherre Robins, D.O.     ------    ---        **Reason for Appointment**    ---      1. Follow up    ---      **History of Present Illness**    ---    _Constitutional_ :    Leah [name] presents today for a follow up. She was not able to get blood work done  for today's visit. She is primarily following up on her mood and anxiety.  Patient has no other concerns or complaints today.      **Current Medications**    ---    Taking     * Furosemide 20 MG Tablet 1 tablet Orally Once a day     ---    * HydrOXYzine HCl 25 MG Tablet 1 tablet as needed Orally every 8 hrs     ---    * [facility] D3 1000 UNIT Capsule TAKE ONE CAPSULE BY MOUTH EVERY DAY     ---    * Latuda 120 MG Tablet 1 tablet Orally Once a day     ---    * Lisinopril 40 MG Tablet 1 tablet Orally Once a day     ---    * Tizanidine HCl 4 MG Tablet 1 tablet as needed Orally Three to four times a day     ---    * Omeprazole 20 MG Capsule Delayed Release TAKE ONE CAPSULE BY MOUTH TWICE A DAY     ---    * Breo Ellipta 100-25 MCG/INH Aerosol Powder Breath Activated 1 puff Inhalation Once a day     ---    * Amlodipine Besylate 10 MG Tablet TAKE 1 TABLET BY MOUTH DAILY     ---    * Ferrous Gluconate 325 (36 Fe) MG Tablet 1 tablet Orally Twice a day     ---    * Atenolol 50 MG Tablet 1 tablet Orally Once a day     ---    * Oxycodone HCl 10 MG Tablet 1 tablet Orally Twice a day, stop date 11/20/2016     ---    * Klonopin 1 MG Tablet TAKE 1 TABLET BY MOUTH 3 TIMES A DAY FOR 14 DAYS     ---    Not-Taking    * Abilify 15 MG Tablet 1 TAB Orally daily     ---    * Pravastatin Sodium 40MG  1 TAB ORAL daily      ---    * Omeprazole 20MG  Tablet Delayed Release 1 TAB ORAL twice daily     ---    * Albuterol Sulfate (2.5 MG/3ML) 0.083% Nebulization Solution 3 ml Inhalation Three times a day     ---    * Advair Diskus 500-50 MCG/DOSE Aerosol Powder Breath Activated INHALE 1 PUFF TWICE A DAY     ---    * Abilify 10mg  tablet TAKE 1 TABLET BY MOUTH ONCE  A DAY     ---    * Spiriva HandiHaler 18 MCG 1 CAP Inhalation daily     ---    * Aripiprazole 15 MG Tablet TAKE 1 TABLET BY MOUTH EVERY DAY     ---    * Zanaflex 4MG  1 TAB ORAL 2-3 times daily     ---    Discontinued    * Amitriptyline HCl 50 MG Tablet 1 tablet Orally     ---    * Cymbalta 60 MG Capsule Delayed Release Particles 1 capsule Orally Once a day     ---    * Medication List reviewed and reconciled with the patient     ---      **Past Medical History**    ---      COPD.        ---    Bipolar.        ---    Substance abuse.        ---    Encephalopthy.        ---    Nondependent tobacco use disorder.        ---    Other and unspecified hyperlipidemia.        ---    Essential hypertension, benign.        ---    Lumbago.        ---    Esophageal reflux.        ---    Dietary surveillance and counseling.        ---    Exercise counseling.        ---    Other counseling, not elsewhere classified.        ---    Overweight.        ---    Hypertension.        ---    Hyperlipidemia.        ---    Other abnormal glucose.        ---    Fatigue.        ---    Vitamin D deficiency.        ---    Personal history of tobacco use, presenting hazards to health.        ---    COPD-Stable.        ---    Cough.        ---    Cough.        ---      **Surgical History**    ---      Denies Past Surgical History    ---      **Family History**    ---      Father: deceased, lung cancer    ---    Mother: deceased, bone cancer    ---    1 brother(s) , 1 sister(s) - healthy.    ---    husband passed 10/02/14-heart attack 53yo.    ---      **Social History**    ---    _Quality 2017:_    Tobacco  Use/Smoking    Are you a _current smoker_    Are you interested in quitting? _Thinking about quitting_    How many cigarettes a day do you smoke? _11-20_    How soon after you wake up do you smoke your first cigarette? _within 5  minutes_    How often do you smoke cigarettes? _some days, but not every  day_    Depression Evaluation (PHQ-9)    Little interest or pleasure in doing things _Nearly every day_    Feeling down, depressed, or hopeless _Nearly every day_    Trouble falling or staying asleep, or sleeping too much _Nearly every day_    Feeling tired or having little energy _Nearly every day_    Poor appetite or overeating _Several days_    Feeling bad about yourself, or that you are a failure, or have let yourself or  your family down _Not at all_    Trouble concentrating on things, such as reading the newspaper or watching  television _Nearly every day_    Moving or speaking so slowly that other people could have noticed. Or the  opposite, being so fidgety or restless that you are moving around a lot more  than usual _Not at all_    Total Score: _16_    Interpretation: _Moderately severe depression_    Thoughts that you would be better off dead, or of hurting yourself in some way  _Not at all_    Depression Screening (PHQ-2)    Little interest or pleasure in doing things _Yes_    Feeling down, depressed, or hopeless _Yes_    Information Last Updated    Date: _03/19/2018_      **Allergies**    ---      N.K.D.A.    ---      **Hospitalization/Major Diagnostic Procedure**    ---      [facility] - fall, altered mental status injury of head 09/17/2014    ---      **Review of Systems**    ---    _General/Constitutional_ :    Patient denies  change in appetite  ,  chills  ,  fatigue  ,  fever  ,  headache  ,  lightheadedness  ,  sleep disturbance  ,  weight gain  ,  weight  loss  .    _Ophthalmologic_ :    Patient denies  blurred vision  ,  diminished visual acuity  ,  discharge  ,  dry eye  ,  eye pain  ,   flashes of light in the visual field  ,  floaters in  the visual field  ,  itching and redness  ,  red eye  .    _ENT_ :    Patient denies  blocked ear(s)  ,  decreased hearing  ,  decreased sense of  smell  ,  difficulty swallowing  ,  dry mouth  ,  ear pain, snoring,  sore  throat  ,  swollen glands  .    _Endocrine_ :    Patient denies  acne  ,  cold intolerance  ,  difficulty sleeping  ,  dizziness  ,  excessive sweating  ,  excessive thirst  ,  frequent urination  ,  hair loss  ,  heat intolerance  ,  weakness  ,  weight loss  .    _Respiratory_ :    Patient denies  asthma, breathing problems  ,  chest pain  ,  cough  ,  hemoptysis  ,  pain with inspiration  ,  shortness of breath  ,  shortness of  breath at rest  ,  shortness of breath with exertion  ,  sputum production  ,  wheezing  .    _Cardiovascular_ :    Patient denies  chest pain at rest  ,  chest pain with exertion  ,  claudication  ,  cyanosis  ,  difficulty laying flat  ,  dizziness  ,  dyspnea  on exertion  ,  fluid accumulation in the legs  ,  irregular heartbeat  ,  orthopnea  ,  palpitations  ,  rheumatic fever  ,  swelling in hands  ,  weakness  ,  weight gain  . Patient complaining of  swelling in feet  .    _Gastrointestinal_ :    Patient denies  abdominal pain  ,  blood in stool  ,  change in bowel habits  ,  constipation  ,  decreased appetite  ,  diarrhea  ,  difficulty swallowing  ,  heartburn  ,  nausea  ,  rectal bleeding  ,  vomiting  ,  weight loss  .    _Genitourinary_ :    Patient denies  abdominal pain/swelling  ,  blood in the urine  ,  difficulty  urinating  ,  frequent urination  ,  pain in lower back  ,  painful urination  .    _Skin_ :    Patient denies  acne  ,  blistering of skin  ,  discoloration  ,  dry skin  ,  eczema  ,  excessive sun exposure  ,  hair changes  ,  hives  ,  itching  ,  masses  ,  mole(s)  ,  nail changes  ,  nodule(s)  ,  photosensitivity  ,  rash  ,  scaly lesions of skin/scalp  ,  skin lesion(s)  ,   skin oozing  .    _Neurologic_ :    Patient denies  balance difficulty  ,  difficulty speaking  ,  dizziness  ,  fainting  ,  gait abnormality  ,  headache  ,  irritability  ,  loss of  strength  ,  loss of use of extremity  ,  low back pain  ,  memory loss  ,  pain  ,  paralysis  ,  seizures  ,  stroke  ,  tics  ,  tingling/numbness  ,  transient loss of vision  ,  tremor  .    _Psychiatric_ :    Patient denies  difficulty sleeping  ,  loss of appetite  ,  stressors  ,  substance abuse  ,  suicidal thoughts  . Patient complaining of  anxiety  ,  depressed mood  .          **Vital Signs**    ---    Ht 61 in, BP 116/68 mm Hg, HR 85 /min, Oxygen sat % 98 %, Ht-cm 154.94 cm.      **Examination**    ---    _General Examination_ :    NECK/THYROID:  normal  ,  carotid pulse normal  ,  neck supple, full range of  motion  ,  no carotid bruit  ,  no cervical lymphadenopathy  ,  no jugular  venous distention  ,  no lymphadenopathy  ,  no thyroid nodules  ,  no  thyromegaly  ,  normal carotid upstroke  ,  normal jugular venous distention  ,  thyroid nontender  ,  thyroid normal  ,  trachea midline  .    SKIN:  bruises on bilateral arms, Left&gt;Right  .    HEART:  normal other than 1-2/6  murmur  ,  no clicks  ,  no jugular venous  distention  ,  no rubs, gallops  ,  point of maximal impulse normal  ,  regular rate and rhythm  .    LUNGS:  normal  ,  clear anteriorly and posteriorly  ,  clear to auscultation  bilaterally  ,  good air movement  ,  no wheezes, rales, rhonchi  .    CHEST:  normal  ,  axillary nodes normal  ,  clear to auscultation and  percussion  ,  no costochondral tenderness  ,  no gross rib deformity  ,  normal anteroposterior (AP) diameter  ,  normal shape and expansion  .    EXTREMITIES:  Trace Edema bilaterally  .          **Assessments**    ---    1. Bruising - T14.8XXA (Primary)    ---    2. Mood disorder - F39    ---    3. History of tobacco use - Z87.891    ---    4. Hypertension - I10    ---       **Treatment**    ---      **1. Bruising**    _LAB: PROTIME-INR_    Notes:  Patient has several bruises on her arm, left&gt;right. She states  that she can easily bruise. Due to this, patient will have an INR level  checked.  .    ---        **2. Mood disorder**    Notes:  Patient's mood has been good. The Kasandra Knudsen is working for her, she is  currently taking 120mg  QD. Patient has been on Klonopin, but her nurse  prescriber was trying to ween her off. Due to this, patient was having  difficulties and withdrawals. Patient stopped seeing her unfortunately.  Therefore, it is necessary for patient to find a new psychiatrist and a  counselor to improve her mood and keep her condition stable.  .        **3. History of tobacco use**    Notes:  Patient had a strong history of tobacco use, in which she was smoking  2.5-3 packs per day. As of now, she has quit smoking about five weeks ago.  .        **4. Hypertension**    Notes:  Blood pressure is good and within normal range  .        **5. Others**    Refill Oxycodone HCl Tablet, 10 MG, 1 tablet, Orally, Twice a day, 30 days, 60  Tablet, Refills 0      **Follow Up**    ---    4 Weeks    Electronically signed by Nolon Bussing , DO on 01/08/2018 at 01:34 PM  CDT    Sign off status: Completed        * * *        Nolon Bussing DO    [address]    [place], [place] [id number]    Tel: [phone]    Fax: [phone]              * * *          Patient: Leah [name], Leah [name] DOB: 06/26/1951 Progress Note: Cherre Robins, D.O. 11/10/2016    ---    Note generated by eClinicalWorks EMR/PM Software ([email or web address])

## 2016-12-17 NOTE — Progress Notes (Signed)
* * *        **Leah [name]**    ------    66 Y old Female, DOB: May 26, 1951    Account Number: [id number]    [address] [place], [id number]    Home: [phone]    Guarantor: Leah [name] Insurance: Susa Simmonds Medicare Complete-[employer]    Appointment Facility: Nolon Bussing DO        * * *    12/17/2016    Progress Notes: Cherre Robins, D.O.     ------    ---        **Reason for Appointment**    ---      1. 1 month f/u    ---      **History of Present Illness**    ---    _Constitutional_ :    Leah [name] presents today for a follow up. She was not able to get blood work done  for today's visit. She is following up on her mood and anxiety. Patient has no  other concerns or complaints today.      **Current Medications**    ---    Taking     * Oxycodone HCl 10 MG Tablet 1 tablet Orally Twice a day     ---    * Furosemide 20 MG Tablet 1 tablet Orally Once a day     ---    * HydrOXYzine HCl 25 MG Tablet 1 tablet as needed Orally every 8 hrs     ---    * Lisinopril 40 MG Tablet 1 tablet Orally Once a day     ---    * Omeprazole 20 MG Capsule Delayed Release TAKE ONE CAPSULE BY MOUTH TWICE A DAY     ---    * Amlodipine Besylate 10 MG Tablet TAKE 1 TABLET BY MOUTH DAILY     ---    * Ferrous Gluconate 325 (36 Fe) MG Tablet 1 tablet Orally Twice a day     ---    * Atenolol 50 MG Tablet 1 tablet Orally Once a day     ---    * Latuda 120 MG Tablet 1 tablet Orally Once a day     ---    * Klonopin 1 MG Tablet 1 tablet at bedtime Orally two times a day     ---    * Vitamin D 2000 UNIT Capsule 1 tablet Orally Once a day     ---    * Tizanidine HCl 4 MG Tablet 1 tablet as needed Orally Three to four times a day     ---    * Amitriptyline HCl 50 MG Tablet 1 tablet Orally Once a day at bedtime     ---    Not-Taking    * Abilify 15 MG Tablet 1 TAB Orally daily     ---    * Pravastatin Sodium 40MG  1 TAB ORAL daily     ---    * Omeprazole 20MG  Tablet Delayed Release 1 TAB ORAL twice  daily     ---    * Albuterol Sulfate (2.5 MG/3ML) 0.083% Nebulization Solution 3 ml Inhalation Three times a day     ---    * Advair Diskus 500-50 MCG/DOSE Aerosol Powder Breath Activated INHALE 1 PUFF TWICE A DAY     ---    * Abilify 10mg  tablet TAKE 1 TABLET BY MOUTH ONCE A DAY     ---    *  Spiriva HandiHaler 18 MCG 1 CAP Inhalation daily     ---    * Aripiprazole 15 MG Tablet TAKE 1 TABLET BY MOUTH EVERY DAY     ---    * Zanaflex 4MG  1 TAB ORAL 2-3 times daily     ---    Discontinued    * [employer] D3 1000 UNIT Capsule TAKE ONE CAPSULE BY MOUTH EVERY DAY     ---    * Breo Ellipta 100-25 MCG/INH Aerosol Powder Breath Activated 1 puff Inhalation Once a day     ---    * Medication List reviewed and reconciled with the patient     ---      **Past Medical History**    ---      COPD.        ---    Bipolar.        ---    Substance abuse.        ---    Encephalopthy.        ---    Nondependent tobacco use disorder.        ---    Other and unspecified hyperlipidemia.        ---    Essential hypertension, benign.        ---    Lumbago.        ---    Esophageal reflux.        ---    Dietary surveillance and counseling.        ---    Exercise counseling.        ---    Other counseling, not elsewhere classified.        ---    Overweight.        ---    Hypertension.        ---    Hyperlipidemia.        ---    Other abnormal glucose.        ---    Fatigue.        ---    Vitamin D deficiency.        ---    Personal history of tobacco use, presenting hazards to health.        ---    COPD-Stable.        ---    Cough.        ---    Cough.        ---      **Surgical History**    ---      No Surgical History documented.    ---      **Family History**    ---      Father: deceased, lung cancer    ---    Mother: deceased, bone cancer    ---    1 brother(s) , 1 sister(s) - healthy.    ---    husband passed 10/02/14-heart attack 53yo.    ---      **Social History**    ---    _Quality 2017:_    Tobacco Use/Smoking Are you a: current smoker  , How often do you smoke  cigarettes?: some days, but not every day, How many cigarettes a day do you  smoke?: 11-20, How soon after you wake up do you smoke your first cigarette?:  within 5 minutes, Are you interested in quitting?: Thinking about quitting.    Depression Evaluation (PHQ-9) Little interest or pleasure in doing things:  Nearly every day ,  Feeling down, depressed, or hopeless: Nearly every day,  Trouble falling or staying asleep, or sleeping too much: Nearly every day,  Feeling tired or having little energy: Nearly every day, Poor appetite or  overeating: Several days, Feeling bad about yourself, or that you are a  failure, or have let yourself or your family down: Not at all, Trouble  concentrating on things, such as reading the newspaper or watching television:  Nearly every day, Moving or speaking so slowly that other people could have  noticed. Or the opposite, being so fidgety or restless that you are moving  around a lot more than usual : Not at all, Thoughts that you would be better  off dead, or of hurting yourself in some way: Not at all, Total Score:: 16,  Interpretation:: Moderately severe depression.    Depression Screening (PHQ-2) Little interest or pleasure in doing things: Yes,  Feeling down, depressed, or hopeless: Yes.    Information Last Updated    Date: _03/19/2018_      **Allergies**    ---      N.K.D.A.    ---      **Hospitalization/Major Diagnostic Procedure**    ---      [facility] - fall, altered mental status injury of head 09/17/2014    ---      **Review of Systems**    ---    _General/Constitutional_ :    Patient denies  change in appetite  ,  chills  ,  fatigue  ,  fever  ,  headache  ,  lightheadedness  ,  sleep disturbance  ,  weight gain  ,  weight  loss  .    _Ophthalmologic_ :    Patient denies  blurred vision  ,  diminished visual acuity  ,  discharge  ,  dry eye  ,  eye pain  ,  flashes of light in the visual field  ,  floaters in  the visual field  ,   itching and redness  ,  red eye  .    _ENT_ :    Patient denies  blocked ear(s)  ,  decreased hearing  ,  decreased sense of  smell  ,  difficulty swallowing  ,  dry mouth  ,  ear pain, snoring,  sore  throat  ,  swollen glands  .    _Endocrine_ :    Patient denies  acne  ,  cold intolerance  ,  difficulty sleeping  ,  dizziness  ,  excessive sweating  ,  excessive thirst  ,  frequent urination  ,  hair loss  ,  heat intolerance  ,  weakness  ,  weight loss  .    _Respiratory_ :    Patient denies  asthma, breathing problems  ,  chest pain  ,  cough  ,  hemoptysis  ,  pain with inspiration  ,  shortness of breath  ,  shortness of  breath at rest  ,  shortness of breath with exertion  ,  sputum production  ,  wheezing  .    _Cardiovascular_ :    Patient denies  chest pain at rest  ,  chest pain with exertion  ,  claudication  ,  cyanosis  ,  difficulty laying flat  ,  dizziness  ,  dyspnea  on exertion  ,  fluid accumulation in the legs  ,  irregular heartbeat  ,  orthopnea  ,  palpitations  ,  rheumatic fever  ,  swelling in hands  ,  weakness  ,  weight gain  . Patient complaining of  swelling in feet  .    _Gastrointestinal_ :    Patient denies  abdominal pain  ,  blood in stool  ,  change in bowel habits  ,  constipation  ,  decreased appetite  ,  diarrhea  ,  difficulty swallowing  ,  heartburn  ,  nausea  ,  rectal bleeding  ,  vomiting  ,  weight loss  .    _Genitourinary_ :    Patient denies  abdominal pain/swelling  ,  blood in the urine  ,  difficulty  urinating  ,  frequent urination  ,  pain in lower back  ,  painful urination  .    _Skin_ :    Patient denies  acne  ,  blistering of skin  ,  discoloration  ,  dry skin  ,  eczema  ,  excessive sun exposure  ,  hair changes  ,  hives  ,  itching  ,  masses  ,  mole(s)  ,  nail changes  ,  nodule(s)  ,  photosensitivity  ,  rash  ,  scaly lesions of skin/scalp  ,  skin lesion(s)  ,  skin oozing  .    _Neurologic_ :    Patient denies  balance difficulty  ,   difficulty speaking  ,  dizziness  ,  fainting  ,  gait abnormality  ,  headache  ,  irritability  ,  loss of  strength  ,  loss of use of extremity  ,  low back pain  ,  memory loss  ,  pain  ,  paralysis  ,  seizures  ,  stroke  ,  tics  ,  tingling/numbness  ,  transient loss of vision  ,  tremor  .    _Psychiatric_ :    Patient denies  difficulty sleeping  ,  loss of appetite  ,  stressors  ,  substance abuse  ,  suicidal thoughts  . Patient complaining of  anxiety  ,  depressed mood  .          **Vital Signs**    ---    Wt 172, Ht 61 in, BMI  32.50 Index  , Temp 99.2 F, BP 110/60 mm Hg, HR 83  /min, Oxygen sat % 99 %, Ht-cm 154.94 cm, Wt-kg 78.02 kg.      **Examination**    ---    _General Examination_ :    NECK/THYROID:  normal  ,  carotid pulse normal  ,  neck supple, full range of  motion  ,  no carotid bruit  ,  no cervical lymphadenopathy  ,  no jugular  venous distention  ,  no lymphadenopathy  ,  no thyroid nodules  ,  no  thyromegaly  ,  normal carotid upstroke  ,  normal jugular venous distention  ,  thyroid nontender  ,  thyroid normal  ,  trachea midline  .    SKIN:  bruises on bilateral arms, Left&gt;Right  .    HEART:  normal other than 1-2/6 murmur  ,  no clicks  ,  no jugular venous  distention  ,  no rubs, gallops  ,  point of maximal impulse normal  ,  regular rate and  rhythm  .    LUNGS:  normal  ,  clear anteriorly and posteriorly  ,  clear to auscultation  bilaterally  ,  good air movement  ,  no wheezes, rales, rhonchi  .    CHEST:  normal  ,  axillary nodes normal  ,  clear to auscultation and  percussion  ,  no costochondral tenderness  ,  no gross rib deformity  ,  normal anteroposterior (AP) diameter  ,  normal shape and expansion  .    EXTREMITIES:  Trace Edema bilaterally  .          **Assessments**    ---    1. Hypertension - I10 (Primary)    ---    2. Bruising - T14.8XXA (Primary)    ---    3. Mood disorder - F39    ---    4. B12 deficiency - E53.8    ---    5. Hyperlipidemia -  E78.5    ---    6. Hyperglycemia - R73.9    ---    7. Vitamin D deficiency, unspecified - E55.9    ---    8. Encounter for general adult medical examination without abnormal findings  - Z00.00    ---      **Treatment**    ---      **1. Hypertension**    Refill Latuda Tablet, 120 MG, 1 tablet, Orally, Once a day, 30 days, 30  Tablet, Refills 0    Start Amitriptyline HCl Tablet, 50 MG, 1 tablet, Orally, Once a day, 30 days,  30 Tablet, Refills 0    Refill Klonopin Tablet, 1 MG, TAKE 1 TABLET BY MOUTH 3 TIMES A DAY FOR 14  DAYS, Orally, two times a day, 30 days, 60, Refills 0    _LAB: TSH_    _LAB: COMPREHENSIVE METABOLIC PANEL_    _LAB: LIPID PANEL_    _LAB: CBC_    _LAB: VITAMIN D,25OH_    _LAB: VITAMIN B12_    _LAB: HEMOGLOBIN A1C_    _LAB: IRON_    Notes:  Blood pressure is good and within normal range  .    ---        **2. Bruising**    Notes:  Patient has had a recent history of falls. Due to falling multiple  times, she has bruising all over both arms. In order to prevent these falls,  patient has been given a prescription to get a walker.  .        **3. Mood disorder**    Notes:  Patient continues to take Jordan and Klonopin for her condition, which  has been helping to maintain her condition. She has decided to set up an  appointment to see a psychiatrist at [facility].  .        **4. B12 deficiency**    _LAB: TSH_    _LAB: COMPREHENSIVE METABOLIC PANEL_    _LAB: LIPID PANEL_    _LAB: CBC_    _LAB: VITAMIN D,25OH_    _LAB: VITAMIN B12_    _LAB: HEMOGLOBIN A1C_    _LAB: IRON_        **5. Hyperlipidemia**    _LAB: TSH_    _LAB: COMPREHENSIVE METABOLIC PANEL_    _LAB: LIPID PANEL_    _LAB: CBC_    _LAB: VITAMIN D,25OH_    _LAB: VITAMIN B12_    _LAB: HEMOGLOBIN A1C_    _LAB: IRON_        **6.  Hyperglycemia**    _LAB: TSH_    _LAB: COMPREHENSIVE METABOLIC PANEL_    _LAB: LIPID PANEL_    _LAB: CBC_    _LAB: VITAMIN D,25OH_    _LAB: VITAMIN B12_    _LAB: HEMOGLOBIN A1C_    _LAB: IRON_        **7.  Vitamin D deficiency, unspecified**    _LAB: TSH_    _LAB: COMPREHENSIVE METABOLIC PANEL_    _LAB: LIPID PANEL_    _LAB: CBC_    _LAB: VITAMIN D,25OH_    _LAB: VITAMIN B12_    _LAB: HEMOGLOBIN A1C_    _LAB: IRON_        **8. Encounter for general adult medical examination without abnormal  findings**    Start ProAir HFA Aerosol Solution, 108 (90 Base) MCG/ACT, 2 puffs as needed,  Inhalation, every 6 hrs, 30 days, 1, Refills 3    _LAB: TSH_    _LAB: COMPREHENSIVE METABOLIC PANEL_    _LAB: LIPID PANEL_    _LAB: CBC_    _LAB: VITAMIN D,25OH_    _LAB: VITAMIN B12_    _LAB: HEMOGLOBIN A1C_    _LAB: IRON_        **9. Others**    Notes:  Currently patient is being bathed by an aide, but she is waiting to  get her bath chair that we ordered.  Patient has been given a lab slip to get  routine blood work done.        .    Electronically signed by Jolaine Artist on 05/30/2018 at 09:14 AM CST    Sign off status: Completed        * * *        Nolon Bussing DO    [address]    [place], [place] [id number]    Tel: [phone]    Fax: [phone]              * * *          Patient: Leah [name], Leah [name] DOB: Mar 02, 1951 Progress Note: Cherre Robins, D.O. 12/17/2016    ---    Note generated by eClinicalWorks EMR/PM Software ([email or web address])

## 2016-12-20 ENCOUNTER — Inpatient Hospital Stay
Admit: 2016-12-20 | Disposition: A | Discharge status: Other Acute Inpatient Hospital | Source: Home / Self Care | Attending: Internal Medicine | Admitting: Internal Medicine

## 2016-12-20 LAB — HX BLUE TOP TO HOLD: CASE NUMBER: 2018169000160

## 2016-12-20 LAB — HX .ABO/RH TYPE TUBE
CASE NUMBER: 2018169000183
HX A1: 0 — NL
HX ABORH: A POS — NL
HX ANTI-B: 0 — NL

## 2016-12-20 LAB — HX CBC W/ DIFF
CASE NUMBER: 2018168001143
HX HCT: 21.8 % — CR (ref 36.0–47.0)
HX HGB: 6.8 g/dL — CR (ref 11.8–15.8)
HX MCH: 34 pg — ABNORMAL HIGH (ref 27.0–31.0)
HX MCHC: 31.2 g/dL — ABNORMAL LOW (ref 32.0–36.0)
HX MCV: 108 fL — ABNORMAL HIGH (ref 81.0–99.0)
HX MPV: 9.1 fL — NL (ref 7.4–11.5)
HX NRBC PERCENT: 0 % — NL
HX PLATELET: 286 10*3/uL — NL (ref 150.0–400.0)
HX RBC: 2.02 10*6/uL — ABNORMAL LOW (ref 3.6–5.0)
HX RDW: 16.1 % — ABNORMAL HIGH (ref 11.5–14.5)
HX WBC: 7 10*3/uL — NL (ref 3.7–11.2)

## 2016-12-20 LAB — HX HEMOGLOBIN AND HEMATOCRIT
CASE NUMBER: 2018169000903
CASE NUMBER: 2018169000904
HX HCT: 24.9 % — ABNORMAL LOW (ref 36.0–47.0)
HX HCT: 26.8 % — ABNORMAL LOW (ref 36.0–47.0)
HX HGB: 8.1 g/dL — ABNORMAL LOW (ref 11.8–15.8)
HX HGB: 8.6 g/dL — ABNORMAL LOW (ref 11.8–15.8)

## 2016-12-20 LAB — HX COMPREHENSIVE METABOLIC PANEL
CASE NUMBER: 2018168001143
HX ALBUMIN LVL: 2.1 g/dL — ABNORMAL LOW (ref 3.5–5.0)
HX ALKALINE PHOSPHATASE: 358 U/L — ABNORMAL HIGH (ref 45.0–117.0)
HX ALT: 34 U/L — NL (ref 6.0–78.0)
HX ANION GAP: 12 — ABNORMAL HIGH (ref 3.0–11.0)
HX AST: 60 U/L — ABNORMAL HIGH (ref 6.0–40.0)
HX BILIRUBIN TOTAL: 1.8 mg/dL — ABNORMAL HIGH (ref 0.2–1.2)
HX BUN: 15 mg/dL — NL (ref 7.0–18.0)
HX CALCIUM LVL: 8 mg/dL — ABNORMAL LOW (ref 8.5–10.1)
HX CHLORIDE: 103 mmol/L — NL (ref 98.0–110.0)
HX CO2: 27 mmol/L — NL (ref 21.0–32.0)
HX CREATININE: 1.38 mg/dL — ABNORMAL HIGH (ref 0.55–1.3)
HX GLUCOSE LVL: 138 mg/dL — ABNORMAL HIGH (ref 65.0–110.0)
HX POTASSIUM LVL: 4.2 mmol/L — NL (ref 3.6–5.2)
HX SODIUM LVL: 142 mmol/L — NL (ref 136.0–145.0)
HX TOTAL PROTEIN: 5.3 g/dL — ABNORMAL LOW (ref 6.0–8.0)

## 2016-12-20 LAB — HX IRON PROFILE
CASE NUMBER: 2018169000160
HX % IRON BOUND: 76 % — ABNORMAL HIGH (ref 10.0–50.0)
HX FERRITIN LVL: 889 ng/mL — ABNORMAL HIGH (ref 8.0–252.0)
HX IRON: 78 ug/dL — NL (ref 50.0–170.0)
HX TIBC: 102 ug/dL — ABNORMAL LOW (ref 250.0–450.0)

## 2016-12-20 LAB — HX .REDCELL
CASE NUMBER: 2018169000183
HX ADDITIONAL UNITS TO CROSSMATCH: 0 — NL
HX NUMBER OF UNITS TO TRANSFUSE: 1

## 2016-12-20 LAB — HX ZZANTIBODY SCREEN
CASE NUMBER: 2018169000183
HX ANTIBODY SCREEN: NEGATIVE — NL
HX G1: 0 — NL
HX G2: 0 — NL
HX G3: 0 — NL

## 2016-12-20 LAB — HX GLOMERULAR FILTRATION RATE (ESTIMATED)
CASE NUMBER: 2018168001143
HX AFN AMER GLOMERULAR FILTRATION RATE: 47 mL/min/{1.73_m2}
HX NON-AFN AMER GLOMERULAR FILTRATION RATE: 40 mL/min/{1.73_m2}

## 2016-12-20 LAB — HX CROSSMATCH: CASE NUMBER: 2018169000183

## 2016-12-20 LAB — HX MAGNESIUM LEVEL
CASE NUMBER: 2018168001143
HX MAGNESIUM LVL: 2.1 mg/dL — NL (ref 1.8–2.4)

## 2016-12-20 LAB — HX .RBC MORPHOLOGY
CASE NUMBER: 2018168001143
HX PLATELET EST: ADEQUATE — NL

## 2016-12-20 LAB — HX CREATINE KINASE
CASE NUMBER: 2018168001143
HX TOTAL CK: 55 U/L — NL (ref 39.0–308.0)

## 2016-12-20 LAB — HX .AUTOMATED DIFF
CASE NUMBER: 2018168001143
HX ABSOLUTE NEUTRO COUNT: 5310 /mm3
HX BASOPHILS: 0 % — NL (ref 0.0–1.0)
HX EOSINOPHILS: 1 % — NL (ref 0.0–3.0)
HX IMMATURE GRANULOCYTES: 0 % — NL (ref 0.0–2.0)
HX LYMPHOCYTES: 12 % — ABNORMAL LOW (ref 22.0–40.0)
HX MONOCYTES: 11 % — NL (ref 0.0–11.0)
HX NEU CT MEASURED: 5.31
HX NEUTROPHILS: 76 % — ABNORMAL HIGH (ref 40.0–71.0)

## 2016-12-20 LAB — HX ETHANOL LEVEL
CASE NUMBER: 2018168001143
HX ETHANOL LVL: <3 — NL (ref 0.0–3.0)

## 2016-12-20 LAB — HX SST GOLD TUBE TO HOLD
CASE NUMBER: 2018169000160
CASE NUMBER: 2018169000904

## 2016-12-20 NOTE — Discharge Summary (Signed)
Date of Admission    12/20/16    Date of Discharge    12/24/16    Admission History    Chief Complaint    fell over bathroom rug    History of Present Illness    66 year old female with past medical history of bipolar disorder, recurrent  falls, anxiety and hypertension, Recently had a prolonged hospitalization From  1/17-02/01/2017 for    cholangitis, CBD stone obstruction, s/p PTBD  who presented to the ED with  Mechanical fall. States she tripped over her shoes when walking across a loose  bathroom rug and was on the ground for several hours, was too weak to stand  up. Eventually pressed medical alert bracelet after 6 hours. Patient  complaining of generalized fatigue. No headaches, no neck or back pain. Does  report easy bruising and petechiae to upper extremities Unclear whether new  for the past 2 weeks or chronic, states she saw her PCP, unsure of the cause  of the lesions.    she lives alone ; has chronic back pain and has chronic gait disorder with  problems ambulating. She reports falling at least 2 times in a week.    In the ED was hemodynamically stable. Found to be profound anemia, hemoglobin  6.8. Review of records reveal hemoglobin 8.6 in February. Patient reports a  history of known iron deficiency anemia and states she is compliant on iron  supplement. Hemoccult was negative in ED. She denies any history of vaginal  bleeding. Lab showed mild renal insufficiency with creatinine 1.3 with  elevated LFTs. Denies any abdominal pain, no nausea or vomiting.    Denies chest pain palpitations of breath. Denies fever chills. Denies bleeding  per rectum or melena or Dark. stools. Denies hemoptysis or hematemesis.    Denies syncope near syncope.        [1] PAST MEDICAL HISTORY:    1\. Chronic back pain.    2\. Peripheral neuropathy.    3\. Scoliosis.    4\. Falls.    5\. Bipolar disorder.    6\. Coronary artery disease, status post bypass.    7\. Anxiety.    8\. Tobacco abuse disorder.    9\. Hypertension.     10\. COPD.    11\. Alcohol abuse disorder.    12\. chronic anemia.        Procedure/Surgical History    Drainage of Common Bile Duct with Drainage Device, Percutaneous Approach  (08/06/2016), Removal of Drainage Device from Hepatobiliary Duct, External  Approach (08/06/2016), Excision of Ampulla of Vater, Percutaneous Approach,  Diagnostic (08/05/2016), Drainage of Common Bile Duct with Drainage Device,  Percutaneous Approach (07/29/2016), Fluoroscopy of Biliary and Pancreatic  Ducts using Other Contrast (07/29/2016), TRAN NAUTO RED BLD CLL PERIPH VN PC  (10/04/2014), Gastric bypass surgery (2012).    Social History    _Alcohol_    Current, 1-2 times per week    Past    _Substance Abuse_    Never    _Tobacco_    Current every day smoker, Cigarettes    Family History    High blood pressure: Father, Sister and Brother.    Allergies    NKA [2]    Code Status    Code Status - Ordered    -- 12/20/16 2:39:00 EDT, Full Resuscitation, Constant Order    Allergies    NKA    Social History    _Alcohol_    Current, 1-2 times per week    Past  _Substance Abuse_    Never    _Tobacco_    Current every day smoker, Cigarettes    Hospital Course    66 year old female with a history of bipolar disorder and a hospitalization in  1-08/2016 with cholangitis with klebsiella bacteremia, treated with  percutaneous biliary tube drainage but no definitive ERCP at that time. States  the tube was removed about 6 weeks after that admission. She returned on 6/17  after she apparently had a mechanical fall at home but was too weak to get up  and eventually called EMS. Patient states was otherwise feeling well. Notes  intermittent L>R UQ at home but no fevers, chills, N/V/D. Here, initially  afebrile without leukocytosis but noted acute on chronic anemia. Then  developed fever to 104 on 6/20 evening and blood cultures from that time  growing GNRs. Patient has moderately elevated LFTs and MRCP shows continued  choledocolithiasis with increased  stones and persistant dilation of the CBD.  EGD yesterday showed deep exudative ulcer at GE junction without active  bleeding. 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    Patient is most consistent with cholangitis, complicated by bacteremia, noting  also unrevealing urinalysis    Cont IV Zosyn    Check UA,urine cx    f/u wound cx    repeat Blood Cx    received 500cc IVF bolus    Cont IVF    Hold Anti HTN meds    GI recommendation--Preferable to PTC would be papillotomy with duodenal  drainage by mouth. This is of significant difficulty and times not feasible by  mouth in the context of gastric bypass, requires an inverted approach the  papilla, along a long jejunal limb. This can be successful and the context of  experience, and Dr. Dalphine Handing    recommend transfer to [facility]he d/w Dr Malva Limes and team have extensive  experience with this complicated approach. PTC would be of secondary  preference as it is not likely to be curative problem long-term, as well as  demonstrated at this time, and is likely to be less traumatic and with a lower  complication rate.    keep the patient nothing by mouth (last food was serial at about 7:30 AM),  continue intravenous antibiotics-    appreciate consultation and input from Dr. Vickki Muff.continue IV zosyn for now.    Follow up blood cultures and tailor antibiotics as able.    If S to levofloxacin, this would be a good option for PO antibiotics on  discharge as this is 100% bioavailable and equivalent to IV.            2\. Acute on chronic anemia    -hx Iron deficiency anemia.    -Guaiac stools negative in the ED    -No evidence of acute bleeding    -s/p 1 unit of blood    will transfuse 1 more unit PRBC per GI rec    -GI f/u    cont PPI 40mg  Po BID    s/p-EGD 6/21    IMPRESSION  AND PLAN:        Procedures under MAC.    EGD (not biopsied).        Significant ulcer in the distal esophagus at the Z line, with significant  exudate which also makes it difficult to ascertain the underlying etiology. No  biopsies taken, and I recommend twice a day PPI therapy along with liquid  Carafate, the PPI therapy twice daily ongoing until the time of next endoscopy  in approximately 8 weeks, liquid Carafate for the next 3 weeks, 3 times a day.        Is not clear to me at this moment when her most recent colonoscopy occurred,  and if it is been more than 24 months I would suggest EGD/colonoscopy at that  time.    [1]        3.Acute kidney injury likely prerenal    -Hold Lasix And ACE inhibitor    -Avoid nephrotoxin medication    -Status post gentle hydration    -Monitor creatinine closely        4.Elevated LFTs, Worsen prior admission 1/17    -Ultrasound abdomen    -Had a prolonged hospitalization for cholangitis CPD stone obstruction With drain placement    -GI f/u    MRCP results reviewed    ERCP at [facility] when bed is ready    d/w ICU Attending DR.Festus Holts --accepted transfer        please include all images in CD    Procedures and Treatment Provided        * Final Report *        Reason For Exam    Abnormal Liver Function Test (LFT)        FINAL REPORT    REPORT: SITE READ:5        Procedure: US Abdomen Complete 12/20/2016 2:07 PM        Indications: Abnormal Liver Function Test (LFT)        COMPARISON: Prior CT and ultrasound.        FINDINGS:        Hepatobiliary: The liver is echodense diffusely most consistent with fatty    infiltration. No masses are seen. There is mild dilatation of central hepatic    ducts, left lobe. The common bile duct is nondilated measuring 0.8 cm.        Gallbladder: The gallbladder is surgically absent.        Pancreas: The visualized portion of the pancreas is normal.        Kidneys: The right kidney measures 9.5 cm. The left kidney measures 10 cm. No    solid  renal masses are seen and there is no hydronephrosis. No stones are    seen.        Spleen: The spleen is not enlarged measuring 12.1 cm in sagittal dimension.            Vessels: The abdominal aorta and intrahepatic inferior vena cava are    unremarkable.        IMPRESSION:        Echodense liver diffusely most consistent with fatty infiltration.        Mild dilatation of intrahepatic bile ducts left lobe.        Janne Lab MD 12/20/2016 2:36 PM        Signature Line    ***** Final Report*****    Signed by: Esaw Dace MD, Timothy 12/20/16 2:36 pm            US Abdomen Complete    This document has an image        [3] * Final Report *        Reason For Exam    History  CBD stones and cholangitis, gastric bypass with PTC drainage earlier  2018;Other (Free text in Reason for Exam)        FINAL REPORT    REPORT: SITE READ:[id number]        Procedure: MRI Cholangiopancreatography 12/22/2016 12:46 PM    Indications: Other (Free text in Reason for Exam) - History CBD stones and    cholangitis, gastric bypass with PTC drainage earlier 2018 .    Comparison: None        Technique: Multiplanar T1 and T2-weighted images were acquired through the    abdomen on a 1.5 Tesla magnet without intravenous contrast.        Findings:        Lower thorax: Imaged portion of lung bases, heart and pericardium appear  within    normal limits. Trace right pleural effusion.        Liver: The liver is diffusely steatotic, with a wedge of relative sparing in  the    right hepatic lobe/segment 7 distribution, and mild sparing in subcapsular    location and about the porta hepatis.        Biliary/gallbladder: There is diffuse and extrahepatic biliary ductal  dilation.    The common bile duct measures up to 1.3 cm, not significantly changed compared    to the prior MRCP of 07/27/2016. Multiple filling defects consistent with  stones    are seen in the distal common bile duct. The stone burden in the distal common    bile duct appears to be increased  compared to the prior study. The gallbladder    is surgically absent.        Pancreas: There is diffuse fatty atrophy of the pancreas. The main pancreatic    duct is nondilated. No focal pancreatic masses are identified.        Adrenal glands: Normal in size and signal intensity, with no focal lesions    identified        Kidneys: There is no hydronephrosis. Multiple foci of fluid signal intensity  on    T2WI are seen, most consistent with cysts.        Spleen: The spleen is normal in size and signal intensity. No focal lesions  are    identified.        Bowel: Imaged large and small bowel loops are normal in caliber and contour.    Post gastric bypass changes are noted. There is a hiatal hernia of the gastric    pouch.        Vasculature: Abdominal aorta and IVC are normal in caliber.        Lymph nodes: No mesenteric or retroperitoneal lymphadenopathy.        Bones: There is multilevel degenerative change of the lumbar spine and a    probable vertebral body hemangioma at L3. Focal expansion of the subcutaneous    fat in the right back, may represent a lipoma and measures approximately 8.6 x    2.2 cm (6:23, 5:10).        IMPRESSION:    1\. Biliary ductal dilation with multiple small stones filling the distal  common    bile duct. Compared to the prior MRCP of 07/27/2016, the degree of biliary    dilation is unchanged but the stone burden in the distal common bile duct    appears to be increased. No obstructing mass is identified, although small    ampullary lesions can be missed at MRI and intrinsic stricture  due to small    mass, ampullary stenosis or sphincter of Oddi dysfunction cannot be excluded.    2\. Hepatic steatosis.    3\. 8.6 x 2.2 cm probable subcutaneous lipoma of the right back. Post gastric    bypass changes with hiatal hernia of the gastric pouch. Trace right pleural    effusion.        Ilda Basset MD 12/22/2016 3:32 PM        Signature Line    ***** Final Report*****    Signed by: Wynelle Link MD,  Roselee Nova 12/22/16 3:32 pm            MRI Cholangiopancreatography    This document has an image        [4]        MAC EGD Op Note [facility] *        Patient: ANTAVIA, TANDY MRN: [id number] FIN: [id number]    Age: 86 years Sex: Female DOB: [birth year]    Associated Diagnoses: None    Author: Reichheld MD, Allayne Gitelman        Postoperative Information    Pre-op diagnosis    Post-op diagnosis    Anesthesia Type    Operative Specimens:    *** No specimens obtained in past 24 hours ***    .        Procedure        Inpatient EGD for evaluation of occult blood negative stool but significant  subacute anemia.        Risks and limitations of upper endoscopy discussed prior to the procedure,  including the risk of bleeding, perforation, infection, possible change in  heart rate, blood pressure, or breathing with sedation. We noted the possible  need for blood transfusion, or surgery. The patient asked questions, and  wishes to proceed.        MAC was given with anesthesia present throughout, as per nursing notes. Well  tolerated, no complications.        Following placement of an oral mouth guard, a video forward-viewing Olympus  high-definition upper endoscope was advanced by way the oropharynx with direct  visualization and ease into the esophagus, stomach, and second portion of the  duodenum. Meticulous inspection of the stridor mucosa was carried out during  slow introduction and withdrawal of the scope, including retroflexion in the  stomach careful viewing of the GE junction in both the forward and retroflexed  view, and careful attention to the proximal aspect of flexures. Air was  removed from the stomach as the scope was slowly withdrawn from the patient.  The patient is resting comfortably post procedure.            Findings:        MAC Given.        Esophagus: Prominent ulcer with thick exudate at the GE junction, appears to  be deep. Exudate cannot be removed, no visible vessel is evident. No biopsies  are taken with  concern for underlying vessel that cannot be visualized. The  lesion is semicircumferential, but more prominent on one side, and could be  peptic, but appears reminiscent of a pill impaction-related ulceration;  neoplasm remains possible and follow-up is recommended as below. Otherwise  normal esophagus, no Barrett's readily evident but the exudate makes this  unclear        Normal gastric remnant, the anastomosis is mildly inflamed but without  ulceration, gastric remnant staple noted without ulceration, the limbs of the  jejunum appear normal.  IMPRESSION AND PLAN:        Procedures under MAC.    EGD (not biopsied).        Significant ulcer in the distal esophagus at the Z line, with significant  exudate which also makes it difficult to ascertain the underlying etiology. No  biopsies taken, and I recommend twice a day PPI therapy along with liquid  Carafate, the PPI therapy twice daily ongoing until the time of next endoscopy  in approximately 8 weeks, liquid Carafate for the next 3 weeks, 3 times a day.        Is not clear to me at this moment when her most recent colonoscopy occurred,  and if it is been more than 24 months I would suggest EGD/colonoscopy at that  time.            Signature Line    Reviewed and electronically verified by: Reichheld MD, Allayne Gitelman    on: 12/23/2016 15:21        Reviewed and electronically authenticated by: Alycia Patten    on: 12/23/16 15:21        Signature Line    Reviewed and electronically verified by: Dalphine Handing MD, Allayne Gitelman    on: 12/24/2016 09:26        Reviewed and electronically authenticated by: Alycia Patten    on: 12/24/16 09:26            [5]    Physical Exam    Vitals & Measurements    **T:** 98.7 F (Oral) **TMIN:** 97.4 F (Oral) **TMAX:** 100.1 F (Oral)  **RR:** 20 **BP:** 100/60 **SpO2:** 93% **O2 Method (L/min):** Room air  **WT:** 82.3 Kg    General: Alert and oriented, No acute distress.    Respiratory: Lungs are clear to auscultation,  Respirations are non-labored.    Cardiovascular: Normal rate, Regular rhythm.    Gastrointestinal: Soft, Non-tender, Non-distended, Normal bowel sounds.    Integumentary: Warm, Dry.    Neurologic: Alert, Oriented.    Psychiatric: Cooperative    SKIN :Multiple petechiae to bilateral upper extremities and across upper  chest. Slight petechiae to the right cheek, Chronic for 2 weeks.    Superficial abrasion to the right forearm and left upper arm and forearm. No  lacerations, no active bleeding.    Lab Results    Glucose Lvl: 82 mg/dL (16/10/96 04:54:09 EDT)    BUN: 10 mg/dL (81/19/14 78:29:56 EDT)    Creatinine: 1.03 mg/dL (21/30/86 57:84:69 EDT)    Afn Amer Glomerular Filtration Rate: 66 ml/min/1.57m2 (12/24/16 12:25:00 EDT)    Brent Bulla Amer Glomerular Filtration Rate: 58 ml/min/1.14m2 (12/24/16 12:25:00  EDT)    Sodium Lvl: 143 mmol/L (12/24/16 12:25:00 EDT)    Potassium Lvl: 4.2 mmol/L (12/24/16 12:25:00 EDT)    Chloride: 111 mmol/L High (12/24/16 12:25:00 EDT)    CO2: 26 mmol/L (12/24/16 12:25:00 EDT)    Anion Gap: 6 (12/24/16 12:25:00 EDT)    Albumin Lvl: 1.6 Gm/dL Low (62/95/28 41:32:44 EDT)    Calcium Lvl: 6.7 mg/dL Critical (07/06/70 53:66:44 EDT)    Lactic Acid Lvl: 1.2 mmol/L (12/24/16 12:25:00 EDT)    WBC: 4.5 thous/mm3 (12/24/16 12:25:00 EDT)    RBC: 2.02 Mil/mm3 Low (12/24/16 12:25:00 EDT)    Hgb: 7.1 Gm/dL Low (03/47/42 59:56:38 EDT)    Hct: 23.1 % Low (12/24/16 15:46:00 EDT)    Platelet: 162 thous/mm3 (12/24/16 12:25:00 EDT)    MCV: 109 fL High (12/24/16 12:25:00 EDT)    MCH: 34 pGm High (12/24/16 12:25:00  EDT)    MCHC: 30.9 Gm/dL Low (16/10/96 04:54:09 EDT)    RDW: 18.8 % High (12/24/16 12:25:00 EDT)    MPV: 8.1 fL (12/24/16 12:25:00 EDT)    Neutrophils: 76 % High (12/24/16 12:25:00 EDT)    Lymphocytes: 14 % Low (12/24/16 12:25:00 EDT)    Monocytes: 8 % (12/24/16 12:25:00 EDT)    Eosinophils: 1 % (12/24/16 12:25:00 EDT)    Basophils: 0 % (12/24/16 12:25:00 EDT)    Immature Granulocytes: 0 % (12/24/16  12:25:00 EDT)    NRBC Percent: 0 % (12/24/16 12:25:00 EDT)    Absolute Neutro Count: 3390 /mm3 (12/24/16 12:25:00 EDT)    Isaias Sakai: A POS (12/24/16 12:25:00 EDT)    Discharge Diagnoses    Acute renal insufficiency    Anemia    Dehydration    Elderly fall    Elevated liver function    fall, knee pain/ headache    Discharge Medications    _Discharge_    aluminum hydroxide/magnesium hydroxide/simethicone 200 mg-200 mg-20 mg/5 mL  oral suspension, 30 mL, PO, q4hr, PRN    amitriptyline, 50 mg, PO, HS    Breo Ellipta 100 mcg-25 mcg/inh inhalation powder, 1 puff(s), INHAL, Daily    clonazePAM 1 mg oral tablet, 1 mg, PO, BID    Cymbalta 60 mg oral delayed release capsule, 60 mg= 1 cap(s), PO, Daily    ferrous gluconate 325 mg (36 mg elemental iron) oral tablet, 325 mg= 1 tab(s),  PO, Daily    Latuda, 120 mg, PO, HS    ondansetron 2 mg/mL injectable solution, 4 mg= 2 mL, IV Push, q8hr, PRN    oxyCODONE 10 mg oral tablet, 10 mg= 1 tab(s), PO, BID    pantoprazole 40 mg oral delayed release tablet, 40 mg= 1 tab(s), PO, BID    Spiriva 18 mcg inhalation capsule, 18 mcg= 1 cap(s), INHAL, Daily    sucralfate 1 g oral tablet, 1 Gm= 1 tab(s), PO, QID    tiZANidine 4 mg oral tablet, 4 mg= 1 tab(s), PO, QID, PRN    Vitamin B12 250 mcg oral tablet, 250 mcg= 1 tab(s), PO, Daily    Vitamin D3, 1000 Unit(s), PO, Daily    Discharge Instructions    Transfer to [facility] for ERCP--Accepting ICU Physician Dr.Sean Shawnee Knapp    [1] Admission H & Kirby Crigler MD, Smita 12/20/2016 11:47 EDT    [2] Admission H & Kirby Crigler MD, Smita 12/20/2016 11:47 EDT    [3] US Abdomen Complete; Esaw Dace MD, Marcial Pacas 12/20/2016 14:28 EDT    [4] MRI Cholangiopancreatography; Wynelle Link MD, Roselee Nova 12/22/2016 13:04 EDT    [5] MAC EGD Op Note [facility] *; Reichheld MD, Allayne Gitelman 12/23/2016 15:17 EDT    SIGNATURE LINE Electronically signed by Derry Lory MD-HOSP, Neosha Switalski on  12/24/2016 at 16:53:50 EST

## 2016-12-20 NOTE — Progress Notes (Signed)
Subjective        Patient seen and examined.    Vitals and labs reviewed.    denies nausea vomiting    Denies chest pain or shortness of breath    Discussed with nursing staff.      Review of Systems    Objective    Vitals & Measurements    **T:** 98.8 F (Oral) **TMIN:** 98.7 F (Oral) **TMAX:** 99.2 F (Oral)  **HR:** 69 **RR:** 16 **BP:** 107/73 **SpO2:** 96% **O2 Method (L/min):** Room  air **WT:** 84.3 Kg    Physical Exam        General: Alert and oriented, No acute distress.    Respiratory: Lungs are clear to auscultation, Respirations are non-labored.    Cardiovascular: Normal rate, Regular rhythm.    Gastrointestinal: Soft, Non-tender, Non-distended, Normal bowel sounds.    Integumentary: Warm, Dry.    Neurologic: Alert, Oriented.    Psychiatric: Cooperative      Medications    _Inpatient_    amitriptyline, 50 mg= 1 tab(s), PO, HS    amLODIPine, 10 mg= 2 tab(s), PO, Daily    bolus NS 500 mL, 500 mL, IV    budesonide 0.5 mg/2 mL inhalation suspension, 0.5 mg= 2 mL, NEB, BID    Carafate, 1 Gm= 1 tab(s), PO, QID    clonazePAM, 1 mg= 1 tab(s), PO, BID    Cymbalta, 60 mg= 2 cap(s), PO, Daily    docusate sodium, 100 mg= 1 cap(s), PO, BID    ferrous sulfate, 325 mg= 1 tab(s), PO, Daily    formoterol 20 mcg/2 mL inhalation solution, 20 mcg= 2 mL, NEB, BID    Latuda, 120 mg= 6 tab(s), PO, HS    Maalox, 30 mL, PO, q4hr, PRN    Metoprolol Tartrate (Lopressor), 50 mg= 1 tab(s), PO, BID    Milk of Magnesia 8% oral suspension, 2400 mg= 30 mL, PO, Daily, PRN    ondansetron, 4 mg= 2 mL, IV Push, q8hr, PRN    oxyCODONE, 10 mg= 1 tab(s), PO, BID    Protonix, 40 mg= 1 EA, IV Push, BID    Sodium Chloride 0.9% 250 mL, 250 mL, IV    Spiriva, 18 mcg= 1 cap(s), INHAL, Daily    Tylenol, 650 mg= 2 tab(s), PO, q8hr, PRN    Vitamin B12, 250 mcg= 2.5 tab(s), PO, Daily    Vitamin D3, 1000 Unit(s)= 1 tab(s), PO, Daily    Zanaflex, 4 mg= 1 tab(s), PO, QID, PRN    Zosyn    _Home_    aluminum hydroxide/magnesium hydroxide/simethicone  200 mg-200 mg-20 mg/5 mL  oral suspension, 30 mL, PO, q4hr, PRN    amitriptyline, 50 mg, PO, HS    Breo Ellipta 100 mcg-25 mcg/inh inhalation powder, 1 puff(s), INHAL, Daily    clonazePAM 1 mg oral tablet, 1 mg, PO, BID    Cymbalta 60 mg oral delayed release capsule, 60 mg= 1 cap(s), PO, Daily    ferrous gluconate 325 mg (36 mg elemental iron) oral tablet, 325 mg= 1 tab(s),  PO, Daily    Latuda, 120 mg, PO, HS    ondansetron 2 mg/mL injectable solution, 4 mg= 2 mL, IV Push, q8hr, PRN    oxyCODONE 10 mg oral tablet, 10 mg= 1 tab(s), PO, BID    pantoprazole 40 mg oral delayed release tablet, 40 mg= 1 tab(s), PO, BID    Spiriva 18 mcg inhalation capsule, 18 mcg= 1 cap(s), INHAL, Daily    sucralfate 1 g oral tablet,  1 Gm= 1 tab(s), PO, QID    tiZANidine 4 mg oral tablet, 4 mg= 1 tab(s), PO, QID, PRN    Vitamin B12 250 mcg oral tablet, 250 mcg= 1 tab(s), PO, Daily    Vitamin D3, 1000 Unit(s), PO, Daily    Lab Results    Glucose Lvl: 72 mg/dL (81/19/14 78:29:56 EDT)    BUN: 8 mg/dL (21/30/86 57:84:69 EDT)    Creatinine: 0.744 mg/dL (62/95/28 41:32:44 EDT)    Afn Amer Glomerular Filtration Rate: >90 (12/25/16 08:48:00 EDT)    Brent Bulla Amer Glomerular Filtration Rate: 85 ml/min/1.41m2 (12/25/16 08:48:00  EDT)    Sodium Lvl: 146 mmol/L High (12/25/16 08:48:00 EDT)    Potassium Lvl: 3.8 mmol/L (12/25/16 08:48:00 EDT)    Chloride: 114 mmol/L High (12/25/16 08:48:00 EDT)    CO2: 25 mmol/L (12/25/16 08:48:00 EDT)    Anion Gap: 7 (12/25/16 08:48:00 EDT)    Total Protein: 4.3 Gm/dL Low (07/06/70 53:66:44 EDT)    Albumin Lvl: 1.5 Gm/dL Low (03/47/42 59:56:38 EDT)    Calcium Lvl: 6.3 mg/dL Critical (75/64/33 29:51:88 EDT)    Calcium Lvl Ionized: 1.02 mmol/L Low (12/25/16 10:22:00 EDT)    Calcium Lvl Ionized pH 7.4: 0.98 mmol/L Low (12/25/16 10:22:00 EDT)    Bilirubin Total: 1.3 mg/dL High (41/66/06 30:16:01 EDT)    Alkaline Phosphatase: 337 Units/L High (12/25/16 08:48:00 EDT)    AST: 44 Units/L High (12/25/16 08:48:00 EDT)    ALT:  41 Units/L (12/25/16 08:48:00 EDT)    Hgb: 8.1 Gm/dL Low (09/32/35 57:32:20 EDT)    Hct: 25.8 % Low (12/25/16 08:48:00 EDT)    Blue Top Tube To Hold: DONE (12/25/16 08:48:00 EDT)    TRANSFUSED: TRANSFUSED (12/24/16 17:23:09 EDT)    Diagnostic Results    Impression and Plan    Acute cholangitis    Acute renal insufficiency    Anemia    Dehydration    Elderly fall    Elevated liver function    fall, knee pain/ headache            66 year old female with a history of bipolar disorder and a hospitalization in  1-08/2016 with cholangitis with klebsiella bacteremia, treated with  percutaneous biliary tube drainage but no definitive ERCP at that time. States  the tube was removed about 6 weeks after that admission. She returned on 6/17  after she apparently had a mechanical fall at home but was too weak to get up  and eventually called EMS. Patient states was otherwise feeling well. Notes  intermittent L>R UQ at home but no fevers, chills, N/V/D. Here, initially  afebrile without leukocytosis but noted acute on chronic anemia. Then  developed fever to 104 on 6/20 evening and blood cultures from that time  growing GNRs. Patient has moderately elevated LFTs and MRCP shows continued  choledocolithiasis with increased stones and persistant dilation of the CBD.  EGD yesterday showed deep exudative ulcer at GE junction without active  bleeding. 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    Patient is most consistent with cholangitis, complicated by bacteremia, noting  also unrevealing urinalysis    Cont IV Zosyn    Check UA,urine cx    f/u wound cx    repeat Blood Cx    received 500cc IVF bolus  Cont IVF    Hold Anti HTN meds    GI recommendation--Preferable to PTC would be papillotomy with duodenal  drainage by mouth. This is  of significant difficulty and times not feasible by  mouth in the context of gastric bypass, requires an inverted approach the  papilla, along a long jejunal limb. This can be successful and the context of  experience, and Dr. Dalphine Handing    recommend transfer to [facility],he d/w Dr Malva Limes and team have extensive  experience with this complicated approach. PTC would be of secondary  preference as it is not likely to be curative problem long-term, as well as  demonstrated at this time, and is likely to be less traumatic and with a lower  complication rate.    keep the patient nothing by mouth (last food was serial at about 7:30 AM),  continue intravenous antibiotics-    appreciate consultation and input from Dr. Vickki Muff.continue IV zosyn for now.    Follow up blood cultures and tailor antibiotics as able.    If S to levofloxacin, this would be a good option for PO antibiotics on  discharge as this is 100% bioavailable and equivalent to IV.            2\. Acute on chronic anemia    -hx Iron deficiency anemia.    -Guaiac stools negative in the ED    -No evidence of acute bleeding    -s/p 2 unit of blood    -GI f/u    cont PPI 40mg  Po BID    s/p-EGD 6/21    IMPRESSION AND PLAN:        Procedures under MAC.    EGD (not biopsied).        Significant ulcer in the distal esophagus at the Z line, with significant  exudate which also makes it difficult to ascertain the underlying etiology. No  biopsies taken, and I recommend twice a day PPI therapy along with liquid  Carafate, the PPI therapy twice daily ongoing until the time of next endoscopy  in approximately 8 weeks, liquid Carafate for the next 3 weeks, 3 times a day.        Is not clear to me at this moment when her most recent colonoscopy occurred,  and if it is been more than 24 months I would suggest EGD/colonoscopy at that  time.            3.Acute kidney injury likely prerenal    -Hold Lasix And ACE inhibitor    -Avoid nephrotoxin medication    -Status post gentle  hydration    -Monitor creatinine closely        4.Elevated LFTs, Worsen prior admission 1/17    -Ultrasound abdomen    -Had a prolonged hospitalization for cholangitis CPD stone obstruction With drain placement    -GI f/u    MRCP results reviewed    ERCP at [facility] when bed is ready    d/w ICU Attending DR.Festus Holts --accepted transfer            discharge summary done on December 24 2016    SIGNATURE LINE Electronically signed by Gerre Scull MD, Dalene Seltzer on 12/25/2016 at  13:14:19 EST

## 2016-12-20 NOTE — Progress Notes (Signed)
Subjective    Complaints of some dizziness    Low-grade temperature 99    Denies abdominal pain nausea vomiting    Discussed with    Objective    Vitals & Measurements    **T:** 98.6 F (Oral) **TMIN:** 97.5 F (Oral) **TMAX:** 99 F (Oral) **HR:**  81 **RR:** 20 **BP:** 115/78 **SpO2:** 97% **O2 Method (L/min):** Room air  **WT:** 82.3 Kg    Physical Exam    General: Alert and oriented, No acute distress.    Respiratory: Lungs are clear to auscultation, Respirations are non-labored.    Cardiovascular: Normal rate, Regular rhythm.    Gastrointestinal: Soft, Non-tender, Non-distended, Normal bowel sounds.    Integumentary: Warm, Dry.    Neurologic: Alert, Oriented.    Psychiatric: Cooperative    SKIN :Multiple petechiae to bilateral upper extremities and across upper  chest. Slight petechiae to the right cheek, Chronic for 2 weeks.    Superficial abrasion to the right forearm and left upper arm and forearm. No  lacerations, no active bleeding.    Medications    _Inpatient_    amitriptyline, 50 mg= 1 tab(s), PO, HS    amLODIPine, 10 mg= 2 tab(s), PO, Daily    budesonide 0.5 mg/2 mL inhalation suspension, 0.5 mg= 2 mL, NEB, BID    clonazePAM, 1 mg= 1 tab(s), PO, BID    Cymbalta, 60 mg= 2 cap(s), PO, Daily    Dextrose 5% in 0.45% NS 1,000 mL, 1000 mL, IV    docusate sodium, 100 mg= 1 cap(s), PO, BID    ferrous sulfate, 325 mg= 1 tab(s), PO, Daily    formoterol 20 mcg/2 mL inhalation solution, 20 mcg= 2 mL, NEB, BID    Latuda, 120 mg= 6 tab(s), PO, HS    Maalox, 30 mL, PO, q4hr, PRN    Metoprolol Tartrate (Lopressor), 50 mg= 1 tab(s), PO, BID    Milk of Magnesia 8% oral suspension, 2400 mg= 30 mL, PO, Daily, PRN    NaCl 0.9% bolus 1,000 mL, 1000 mL, IV    ondansetron, 4 mg= 2 mL, IV Push, q8hr, PRN    oxyCODONE, 10 mg= 1 tab(s), PO, BID    pantoprazole, 40 mg= 1 tab(s), PO, BID    Sodium Chloride 0.9% 250 mL, 250 mL, IV    Spiriva, 18 mcg= 1 cap(s), INHAL, Daily    Vitamin B12, 250 mcg= 2.5 tab(s), PO,  Daily    Vitamin D3, 1000 Unit(s)= 1 tab(s), PO, Daily    Zanaflex, 4 mg= 1 tab(s), PO, QID, PRN    _Home_    amitriptyline, 50 mg, PO, HS    amLODIPine, 10 mg, PO, Daily    atenolol, 50 mg, PO, Daily    Breo Ellipta 100 mcg-25 mcg/inh inhalation powder, 1 puff(s), INHAL, Daily    clonazePAM 1 mg oral tablet, 1 mg, PO, BID    Cymbalta 60 mg oral delayed release capsule, 60 mg= 1 cap(s), PO, Daily    ferrous gluconate 325 mg (36 mg elemental iron) oral tablet, 325 mg= 1 tab(s),  PO, Daily    furosemide, 20 mg, PO, Daily    Latuda, 120 mg, PO, HS    lisinopril, 40 mg, PO, Daily    omeprazole, 20 mg, PO, BID    oxyCODONE 10 mg oral tablet, 10 mg= 1 tab(s), PO, BID    Spiriva 18 mcg inhalation capsule, 18 mcg= 1 cap(s), INHAL, Daily    tiZANidine 4 mg oral tablet, 4 mg= 1 tab(s), PO, QID,  PRN    Vitamin B12 250 mcg oral tablet, 250 mcg= 1 tab(s), PO, Daily    Vitamin D3, 1000 Unit(s), PO, Daily    Lab Results    Glucose Lvl: 84 mg/dL (44/01/02 72:53:66 EDT)    BUN: 11 mg/dL (44/03/47 42:59:56 EDT)    Creatinine: 0.888 mg/dL (38/75/64 33:29:51 EDT)    Afn Amer Glomerular Filtration Rate: 79 ml/min/1.45m2 (12/21/16 07:29:00 EDT)    Brent Bulla Amer Glomerular Filtration Rate: 69 ml/min/1.72m2 (12/21/16 07:29:00  EDT)    Sodium Lvl: 142 mmol/L (12/21/16 07:29:00 EDT)    Potassium Lvl: 4.4 mmol/L (12/21/16 07:29:00 EDT)    Chloride: 106 mmol/L (12/21/16 07:29:00 EDT)    CO2: 27 mmol/L (12/21/16 07:29:00 EDT)    Anion Gap: 9 (12/21/16 07:29:00 EDT)    Calcium Lvl: 7.5 mg/dL Low (88/41/66 06:30:16 EDT)    WBC: 5.4 thous/mm3 (12/21/16 07:29:00 EDT)    RBC: 2.78 Mil/mm3 Low (12/21/16 07:29:00 EDT)    Hgb: 9.2 Gm/dL Low (07/13/30 35:57:32 EDT)    Hct: 28.6 % Low (12/21/16 07:29:00 EDT)    Platelet: 304 thous/mm3 (12/21/16 07:29:00 EDT)    MCV: 103 fL High (12/21/16 07:29:00 EDT)    MCH: 33 pGm High (12/21/16 07:29:00 EDT)    MCHC: 32.2 Gm/dL (20/25/42 70:62:37 EDT)    RDW: 19.1 % High (12/21/16 07:29:00 EDT)    MPV: 8.7 fL (12/21/16  07:29:00 EDT)    Neutrophils: 65 % (12/21/16 07:29:00 EDT)    Lymphocytes: 24 % (12/21/16 07:29:00 EDT)    Monocytes: 8 % (12/21/16 07:29:00 EDT)    Eosinophils: 2 % (12/21/16 07:29:00 EDT)    Basophils: 0 % (12/21/16 07:29:00 EDT)    Immature Granulocytes: 0 % (12/21/16 07:29:00 EDT)    NRBC Percent: 0 % (12/21/16 07:29:00 EDT)    Absolute Neutro Count: 3520 /mm3 (12/21/16 07:29:00 EDT)    PT: 11.1 sec (12/21/16 07:29:00 EDT)    INR: 1.1 (12/21/16 07:29:00 EDT)    PTT: 26.7 sec (12/21/16 07:29:00 EDT)    Diagnostic Results    Impression and Plan        PAST MEDICAL HISTORY:    1\. Chronic back pain.    2\. Peripheral neuropathy.    3\. Scoliosis.    4\. Falls.    5\. Bipolar disorder.    6\. Coronary artery disease, status post bypass.    7\. Anxiety.    8\. Tobacco abuse disorder.    9\. Hypertension.    10\. COPD.    11\. Alcohol abuse disorder.    12\. chronic anemia.            66 year old female with past medical history of bipolar disorder, recurrent  falls, anxiety and hypertension, Recently had a prolonged hospitalization From  1/17-02/01/2017 for    cholangitis, CBD stone obstruction, s/p PTBD  who presented to the ED with  Mechanical fall. Found to be profound anemia, hemoglobin 6.8. Review of  records reveal hemoglobin 8.6 in February.    Lab showed mild renal insufficiency with creatinine 1.3 with elevated LFTs.        1.Acute on chronic anemia; Profound    hx Iron deficiency anemia.    Guaiac stools negative in the ED. No evidence of acute bleeding    Transfuse with 1 units of packed RBC.    GI consult        2.Acute kidney injury likely prerenal    Hold Lasix And ACE inhibitor;Avoid nephrotoxin medication    Status post gentle hydration. Monitor creatinine  closely        3\. Elevated LFTs, Worsen prior admission 1/17    Ultrasound abdomen, Had a prolonged hospitalization for cholangitis CPD stone  obstruction With drain placement    Monitor closely    4.GI prophylaxis Protonix    DVT prophylaxis  Sequential given the anemia      SIGNATURE LINE Electronically signed by Lorin Picket MD, Alfretta Pinch on 12/21/2016 at  14:12:27 EST

## 2016-12-20 NOTE — Progress Notes (Signed)
Subjective    Patient was seen and assessed in the am. She denies any cp or sob. She denies  any abdominal pain, nausea or vomiting. Patient scheduled for MRCP.        patient scheduled for EGD tomorrow.    Review of Systems    Objective    Vitals & Measurements    **T:** 98.4 F (Oral) **TMIN:** 97.8 F (Oral) **TMAX:** 98.4 F (Oral)  **RR:** 18 **BP:** 108/62 **SpO2:** 95% **O2 Method (L/min):** Room air    Physical Exam    General: NAD    Respiratory: Lungs clear to auscultation    Cardiovascular: Normal Rate, normal rhythm    Gastrointestinal: diffuse abdominal pain    Musculoskeletal: deconditioned    Neurologic: no focal deficits    Psychiatric: flat affect      Medications    _Inpatient_    amitriptyline, 50 mg= 1 tab(s), PO, HS    amLODIPine, 10 mg= 2 tab(s), PO, Daily    budesonide 0.5 mg/2 mL inhalation suspension, 0.5 mg= 2 mL, NEB, BID    clonazePAM, 1 mg= 1 tab(s), PO, BID    Cymbalta, 60 mg= 2 cap(s), PO, Daily    Dextrose 5% in 0.45% NS 1,000 mL, 1000 mL, IV    docusate sodium, 100 mg= 1 cap(s), PO, BID    ferrous sulfate, 325 mg= 1 tab(s), PO, Daily    formoterol 20 mcg/2 mL inhalation solution, 20 mcg= 2 mL, NEB, BID    Latuda, 120 mg= 6 tab(s), PO, HS    Maalox, 30 mL, PO, q4hr, PRN    Metoprolol Tartrate (Lopressor), 50 mg= 1 tab(s), PO, BID    Milk of Magnesia 8% oral suspension, 2400 mg= 30 mL, PO, Daily, PRN    NaCl 0.9% bolus 1,000 mL, 1000 mL, IV    ondansetron, 4 mg= 2 mL, IV Push, q8hr, PRN    oxyCODONE, 10 mg= 1 tab(s), PO, BID    pantoprazole, 40 mg= 1 tab(s), PO, BID    Sodium Chloride 0.9% 250 mL, 250 mL, IV    Spiriva, 18 mcg= 1 cap(s), INHAL, Daily    Vitamin B12, 250 mcg= 2.5 tab(s), PO, Daily    Vitamin D3, 1000 Unit(s)= 1 tab(s), PO, Daily    Zanaflex, 4 mg= 1 tab(s), PO, QID, PRN    _Home_    amitriptyline, 50 mg, PO, HS    amLODIPine, 10 mg, PO, Daily    atenolol, 50 mg, PO, Daily    Breo Ellipta 100 mcg-25 mcg/inh inhalation powder, 1 puff(s), INHAL, Daily    clonazePAM  1 mg oral tablet, 1 mg, PO, BID    Cymbalta 60 mg oral delayed release capsule, 60 mg= 1 cap(s), PO, Daily    ferrous gluconate 325 mg (36 mg elemental iron) oral tablet, 325 mg= 1 tab(s),  PO, Daily    furosemide, 20 mg, PO, Daily    Latuda, 120 mg, PO, HS    lisinopril, 40 mg, PO, Daily    omeprazole, 20 mg, PO, BID    oxyCODONE 10 mg oral tablet, 10 mg= 1 tab(s), PO, BID    Spiriva 18 mcg inhalation capsule, 18 mcg= 1 cap(s), INHAL, Daily    tiZANidine 4 mg oral tablet, 4 mg= 1 tab(s), PO, QID, PRN    Vitamin B12 250 mcg oral tablet, 250 mcg= 1 tab(s), PO, Daily    Vitamin D3, 1000 Unit(s), PO, Daily    Lab Results    Total Protein: 5.7 Gm/dL Low (16/10/96  05:47:00 EDT)    Albumin Lvl: 2.1 Gm/dL Low (16/10/96 04:54:09 EDT)    Bilirubin Total: 1.4 mg/dL High (81/19/14 78:29:56 EDT)    Bilirubin Direct: 0.7 mg/dL High (21/30/86 57:84:69 EDT)    Alkaline Phosphatase: 306 Units/L High (12/22/16 05:47:00 EDT)    AST: 28 Units/L (12/22/16 05:47:00 EDT)    ALT: 25 Units/L (12/22/16 05:47:00 EDT)    Folate Lvl: >20.0 (12/22/16 05:47:00 EDT)    Vitamin B12 Lvl: 1946 pg/mL High (12/22/16 05:47:00 EDT)    WBC: 7.3 thous/mm3 (12/22/16 05:47:00 EDT)    RBC: 2.81 Mil/mm3 Low (12/22/16 05:47:00 EDT)    Hgb: 9.4 Gm/dL Low (62/95/28 41:32:44 EDT)    Hct: 29.6 % Low (12/22/16 05:47:00 EDT)    Platelet: 372 thous/mm3 (12/22/16 05:47:00 EDT)    MCV: 105 fL High (12/22/16 05:47:00 EDT)    MCH: 34 pGm High (12/22/16 05:47:00 EDT)    MCHC: 31.8 Gm/dL Low (07/06/70 53:66:44 EDT)    RDW: 18.5 % High (12/22/16 05:47:00 EDT)    MPV: 8.9 fL (12/22/16 05:47:00 EDT)    NRBC Percent: 0 % (12/22/16 05:47:00 EDT)    Retic Count Percent: 6.4 % High (12/22/16 05:47:00 EDT)    Absolute Retic Count: 182 thous/mcL High (12/22/16 05:47:00 EDT)    Diagnostic Results    Impression and Plan    Acute renal insufficiency    Anemia    Dehydration    Elderly fall    Elevated liver function    fall, knee pain/ headache    PAST MEDICAL HISTORY:    1\. Chronic  back pain.    2\. Peripheral neuropathy.    3\. Scoliosis.    4\. Falls.    5\. Bipolar disorder.    6\. Coronary artery disease, status post bypass.    7\. Anxiety.    8\. Tobacco abuse disorder.    9\. Hypertension.    10\. COPD.    11\. Alcohol abuse disorder.    12\. chronic anemia.            66 year old female with past medical history of bipolar disorder, recurrent  falls, anxiety and hypertension, Recently had a prolonged hospitalization From  1/1-02/01/2017 for cholangitis, CBD stone obstruction, s/p PTBD  who presented  to the ED with Mechanical fall. Found to be profound anemia, hemoglobin 6.8.  Review of records reveal hemoglobin 8.6 in February. Lab showed mild renal  insufficiency with creatinine 1.3 with elevated LFTs.        #Acute on chronic anemia    -hx Iron deficiency anemia.    -Guaiac stools negative in the ED    -No evidence of acute bleeding    -s/p 1 unit of blood    -h/h stable    -GI consult    -EGD scheduled for tomorrow        #Acute kidney injury likely prerenal    -Hold Lasix And ACE inhibitor    -Avoid nephrotoxin medication    -Status post gentle hydration    -Monitor creatinine closely        #Elevated LFTs, Worsen prior admission 1/17    -Ultrasound abdomen    -Had a prolonged hospitalization for cholangitis CPD stone obstruction With drain placement    -GI consulted, MRCP scheduled            #GI prophylaxis Protonix        DVT prophylaxis Sequential giv          SIGNATURE LINE Electronically signed by Brynda Peon MD, Deatra James  on 01/23/2017 at  17:47:52 EST

## 2016-12-20 NOTE — Progress Notes (Signed)
Subjective    no new c/o    low grade fever    Review of Systems    Objective    Vitals & Measurements    **T:** 100.3 F (Oral) **TMIN:** 97.0 F (Oral) **TMAX:** 104.0 F (Oral)  **RR:** 18 **BP:** 125/70 **SpO2:** 100% **O2 Method (L/min):** Room air    Physical Exam    General: Alert and oriented, No acute distress.    Respiratory: Lungs are clear to auscultation, Respirations are non-labored.    Cardiovascular: Normal rate, Regular rhythm.    Gastrointestinal: Soft, Non-tender, Non-distended, Normal bowel sounds.    Integumentary: Warm, Dry.    Neurologic: Alert, Oriented.    Psychiatric: Cooperative    SKIN :Multiple petechiae to bilateral upper extremities and across upper  chest. Slight petechiae to the right cheek, Chronic for 2 weeks.    Superficial abrasion to the right forearm and left upper arm and forearm. No  lacerations, no active bleeding.    Medications    _Inpatient_    amitriptyline, 50 mg= 1 tab(s), PO, HS    amLODIPine, 10 mg= 2 tab(s), PO, Daily    budesonide 0.5 mg/2 mL inhalation suspension, 0.5 mg= 2 mL, NEB, BID    clonazePAM, 1 mg= 1 tab(s), PO, BID    Cymbalta, 60 mg= 2 cap(s), PO, Daily    Dextrose 5% in 0.45% NS 1,000 mL, 1000 mL, IV    docusate sodium, 100 mg= 1 cap(s), PO, BID    ferrous sulfate, 325 mg= 1 tab(s), PO, Daily    formoterol 20 mcg/2 mL inhalation solution, 20 mcg= 2 mL, NEB, BID    Latuda, 120 mg= 6 tab(s), PO, HS    Maalox, 30 mL, PO, q4hr, PRN    Metoprolol Tartrate (Lopressor), 50 mg= 1 tab(s), PO, BID    Milk of Magnesia 8% oral suspension, 2400 mg= 30 mL, PO, Daily, PRN    NaCl 0.9% bolus 1,000 mL, 1000 mL, IV    NaCl 0.9% bolus 500 mL, 500 mL, IV    ondansetron, 4 mg= 2 mL, IV Push, q8hr, PRN    oxyCODONE, 10 mg= 1 tab(s), PO, BID    pantoprazole, 40 mg= 1 tab(s), PO, BID    Sodium Chloride 0.9% 250 mL, 250 mL, IV    Spiriva, 18 mcg= 1 cap(s), INHAL, Daily    Tylenol, 650 mg= 2 tab(s), PO, q8hr, PRN    Vitamin B12, 250 mcg= 2.5 tab(s), PO, Daily    Vitamin  D3, 1000 Unit(s)= 1 tab(s), PO, Daily    Zanaflex, 4 mg= 1 tab(s), PO, QID, PRN    _Home_    amitriptyline, 50 mg, PO, HS    amLODIPine, 10 mg, PO, Daily    atenolol, 50 mg, PO, Daily    Breo Ellipta 100 mcg-25 mcg/inh inhalation powder, 1 puff(s), INHAL, Daily    clonazePAM 1 mg oral tablet, 1 mg, PO, BID    Cymbalta 60 mg oral delayed release capsule, 60 mg= 1 cap(s), PO, Daily    ferrous gluconate 325 mg (36 mg elemental iron) oral tablet, 325 mg= 1 tab(s),  PO, Daily    furosemide, 20 mg, PO, Daily    Latuda, 120 mg, PO, HS    lisinopril, 40 mg, PO, Daily    omeprazole, 20 mg, PO, BID    oxyCODONE 10 mg oral tablet, 10 mg= 1 tab(s), PO, BID    Spiriva 18 mcg inhalation capsule, 18 mcg= 1 cap(s), INHAL, Daily    tiZANidine 4 mg  oral tablet, 4 mg= 1 tab(s), PO, QID, PRN    Vitamin B12 250 mcg oral tablet, 250 mcg= 1 tab(s), PO, Daily    Vitamin D3, 1000 Unit(s), PO, Daily    Lab Results    Lactic Acid Lvl: 1.3 mmol/L (12/22/16 23:52:00 EDT)    Hgb: 8.7 Gm/dL Low (75/64/33 29:51:88 EDT)    Hct: 27.7 % Low (12/23/16 07:31:00 EDT)    UA Color: Amber1 (12/23/16 08:39:00 EDT)    UA Clarity: Clear1 (12/23/16 08:39:00 EDT)    UA Specific Gravity: 1.014 (12/23/16 08:39:00 EDT)    UA pH: 6 (12/23/16 08:39:00 EDT)    UA Protein: Negative1 (12/23/16 08:39:00 EDT)    UA Glucose: Negative1 (12/23/16 08:39:00 EDT)    UA Ketones: Negative1 (12/23/16 08:39:00 EDT)    UA Bilirubin: Negative1 (12/23/16 08:39:00 EDT)    UA Blood: Negative1 (12/23/16 08:39:00 EDT)    UA Urobilinogen: 4.0 Abnormal (12/23/16 08:39:00 EDT)    UA Nitrite: Negative1 (12/23/16 08:39:00 EDT)    UA Leukocyte Esterase: Negative1 (12/23/16 08:39:00 EDT)    UA RBC: <1 (12/23/16 08:39:00 EDT)    UA WBC: 1 /HPF (12/23/16 08:39:00 EDT)    UA Squamous Epithelial: 1 /HPF (12/23/16 08:39:00 EDT)    UA Bacteria: None1 (12/23/16 08:39:00 EDT)    UA Hyaline Casts: 5 /LPF High (12/23/16 08:39:00 EDT)    Diagnostic Results    Impression and Plan    PAST MEDICAL HISTORY:     1\. Chronic back pain.    2\. Peripheral neuropathy.    3\. Scoliosis.    4\. Falls.    5\. Bipolar disorder.    6\. Coronary artery disease, status post bypass.    7\. Anxiety.    8\. Tobacco abuse disorder.    9\. Hypertension.    10\. COPD.    11\. Alcohol abuse disorder.    12\. chronic anemia.            66 year old female with past medical history of bipolar disorder, recurrent  falls, anxiety and hypertension, Recently had a prolonged hospitalization From  1/1-02/01/2017 for cholangitis, CBD stone obstruction, s/p PTBD  who presented  to the ED with Mechanical fall. Found to be profound anemia, hemoglobin 6.8.  Review of records reveal hemoglobin 8.6 in February. Lab showed mild renal  insufficiency with creatinine 1.3 with elevated LFTs.        1\. Acute on chronic anemia    -hx Iron deficiency anemia.    -Guaiac stools negative in the ED    -No evidence of acute bleeding    -s/p 1 unit of blood    -h/h stable    -GI f/u    -EGD today        2.Acute kidney injury likely prerenal    -Hold Lasix And ACE inhibitor    -Avoid nephrotoxin medication    -Status post gentle hydration    -Monitor creatinine closely        3.Elevated LFTs, Worsen prior admission 1/17    -Ultrasound abdomen    -Had a prolonged hospitalization for cholangitis CPD stone obstruction With drain placement    -GI f/u    MRCP results reviewed    blood cx f/u        #GI prophylaxis Protonix        DVT prophylaxis Sequential    SIGNATURE LINE Electronically signed by Derry Lory MD-HOSP, Nixon Sparr on  12/23/2016 at 11:29:59 EST

## 2016-12-20 NOTE — H&P (Signed)
Chief Complaint    fell over bathroom rug    History of Present Illness    66 year old female with past medical history of bipolar disorder, recurrent  falls, anxiety and hypertension, Recently had a prolonged hospitalization From  1/17-02/01/2017 for    cholangitis, CBD stone obstruction, s/p PTBD who presented to the ED with  Mechanical fall. States she tripped over her shoes when walking across a loose  bathroom rug and was on the ground for several hours, was too weak to stand  up. Eventually pressed medical alert bracelet after 6 hours. Patient  complaining of generalized fatigue. No headaches, no neck or back pain. Does  report easy bruising and petechiae to upper extremities Unclear whether new  for the past 2 weeks or chronic, states she saw her PCP, unsure of the cause  of the lesions.    she lives alone ; has chronic back pain and has chronic gait disorder with  problems ambulating. She reports falling at least 2 times in a week.    In the ED was hemodynamically stable. Found to be profound anemia, hemoglobin  6.8. Review of records reveal hemoglobin 8.6 in February. Patient reports a  history of known iron deficiency anemia and states she is compliant on iron  supplement. Hemoccult was negative in ED. She denies any history of vaginal  bleeding. Lab showed mild renal insufficiency with creatinine 1.3 with  elevated LFTs. Denies any abdominal pain, no nausea or vomiting.    Denies chest pain palpitations of breath. Denies fever chills. Denies bleeding  per rectum or melena or Dark. stools. Denies hemoptysis or hematemesis.    Denies syncope near syncope.              Review of Systems    All systems have been reviewed and are negative unless identified in the HPI.    Code Status    Code Status - Ordered    -- 12/20/16 2:39:00 EDT, Full Resuscitation, Constant Order    Physical Exam    Vitals & Measurements    **T:** 97.7 F (Oral) **TMIN:** 97.4 F (Oral) **TMAX:** 98.3 F (Oral)  **HR:** 57 **RR:**  17 **BP:** 111/72 **SpO2:** 99% **O2 Method (L/min):** Room  air **WT:** 80.5 Kg    General: Alert and oriented, No acute distress.    Respiratory: Lungs are clear to auscultation, Respirations are non-labored.    Cardiovascular: Normal rate, Regular rhythm.    Gastrointestinal: Soft, Non-tender, Non-distended, Normal bowel sounds.    Integumentary: Warm, Dry.    Neurologic: Alert, Oriented.    Psychiatric: Cooperative    SKIN :Multiple petechiae to bilateral upper extremities and across upper  chest. Slight petechiae to the right cheek, Chronic for 2 weeks.    Superficial abrasion to the right forearm and left upper arm and forearm. No  lacerations, no active bleeding.      Impression and Plan        PAST MEDICAL HISTORY:    1\. Chronic back pain.    2\. Peripheral neuropathy.    3\. Scoliosis.    4\. Falls.    5\. Bipolar disorder.    6\. Coronary artery disease, status post bypass.    7\. Anxiety.    8\. Tobacco abuse disorder.    9\. Hypertension.    10\. COPD.    11\. Alcohol abuse disorder.    12\. chronic anemia.            66 year old female with past medical history of bipolar disorder,  recurrent  falls, anxiety and hypertension, Recently had a prolonged hospitalization From  1/17-02/01/2017 for    cholangitis, CBD stone obstruction, s/p PTBD  who presented to the ED with  Mechanical fall. Found to be profound anemia, hemoglobin 6.8. Review of  records reveal hemoglobin 8.6 in February.    Lab showed mild renal insufficiency with creatinine 1.3 with elevated LFTs.        1.Acute on chronic anemia; Profound    hx Iron deficiency anemia.    Admitted and monitored on a telemetry bed. Send iron profile, vitamin B-12 and  folic acid.    Guaiac stools negative in the ED. No evidence of acute bleeding    Transfuse with 2 units of packed RBC.    GI consult        2.Acute kidney injury likely prerenal    Hold Lasix And ACE inhibitor;Avoid nephrotoxin medication    Status post gentle hydration. Monitor creatinine  closely        3\. Elevated LFTs, Worsen prior admission 1/17    Ultrasound abdomen, Had a prolonged hospitalization for cholangitis CPD stone  obstruction With drain placement    Monitor closely    4.GI prophylaxis Protonix    DVT prophylaxis Sequential given the anemia      CMS Two-Midnight Rule    I certify that hospital inpatient services are reasonable and medically  necessary. They are appropriately provided as inpatient services in accordance  with the two midnight benchmark under 42CFR 412 3(e), or the services are  specified as inpatient only procedure under 42 CFR 419 22(n).    more than 2 midnight stay        Problem List/Past Medical History    _Ongoing_    Tobacco use    _Historical_    No qualifying data        PAST MEDICAL HISTORY:    1\. Chronic back pain.    2\. Peripheral neuropathy.    3\. Scoliosis.    4\. Falls.    5\. Bipolar disorder.    6\. Coronary artery disease, status post bypass.    7\. Anxiety.    8\. Tobacco abuse disorder.    9\. Hypertension.    10\. COPD.    11\. Alcohol abuse disorder.    12\. chronic anemia.      Procedure/Surgical History    Drainage of Common Bile Duct with Drainage Device, Percutaneous Approach  (08/06/2016), Removal of Drainage Device from Hepatobiliary Duct, External  Approach (08/06/2016), Excision of Ampulla of Vater, Percutaneous Approach,  Diagnostic (08/05/2016), Drainage of Common Bile Duct with Drainage Device,  Percutaneous Approach (07/29/2016), Fluoroscopy of Biliary and Pancreatic  Ducts using Other Contrast (07/29/2016), TRAN NAUTO RED BLD CLL PERIPH VN PC  (10/04/2014), Gastric bypass surgery (2012).    Social History    _Alcohol_    Current, 1-2 times per week    Past    _Substance Abuse_    Never    _Tobacco_    Current every day smoker, Cigarettes    Family History    High blood pressure: Father, Sister and Brother.    Allergies    NKA    Medications    _Inpatient_    amitriptyline, 50 mg= 1 tab(s), PO, HS    amLODIPine, 10 mg, PO,  Daily    budesonide 0.5 mg/2 mL inhalation suspension, 0.5 mg= 2 mL, NEB, BID    clonazePAM, 1 mg= 1 tab(s), PO, BID    Cymbalta, 60 mg, PO, Daily  Dextrose 5% in 0.45% NS 1,000 mL, 1000 mL, IV    formoterol 20 mcg/2 mL inhalation solution, 20 mcg= 2 mL, NEB, BID    furosemide, 20 mg, PO, Daily    Latuda, 120 mg, PO, HS    lisinopril, 40 mg, PO, Daily    Metoprolol Tartrate (Lopressor), 50 mg, PO, BID    NaCl 0.9% bolus 1,000 mL, 1000 mL, IV    oxyCODONE, 10 mg, PO, BID    pantoprazole, 40 mg= 1 tab(s), PO, Daily    pantoprazole, 40 mg= 1 tab(s), PO, BID    Sodium Chloride 0.9% 250 mL, 250 mL, IV    Spiriva, 18 mcg= 1 cap(s), INHAL, Daily    Vitamin B12, 250 mcg, PO, Daily    Vitamin D3, 1000 Unit(s), PO, Daily    _Home_    amitriptyline, 50 mg, PO, HS    amLODIPine, 10 mg, PO, Daily    atenolol, 50 mg, PO, Daily    Breo Ellipta 100 mcg-25 mcg/inh inhalation powder, 1 puff(s), INHAL, Daily    clonazePAM 1 mg oral tablet, 1 mg, PO, BID    Cymbalta 60 mg oral delayed release capsule, 60 mg= 1 cap(s), PO, Daily    ferrous gluconate 325 mg (36 mg elemental iron) oral tablet, 325 mg= 1 tab(s),  PO, Daily    furosemide, 20 mg, PO, Daily    Latuda, 120 mg, PO, HS    lisinopril, 40 mg, PO, Daily    omeprazole, 20 mg, PO, BID    oxyCODONE 10 mg oral tablet, 10 mg= 1 tab(s), PO, BID    Spiriva 18 mcg inhalation capsule, 18 mcg= 1 cap(s), INHAL, Daily    tiZANidine 4 mg oral tablet, 4 mg= 1 tab(s), PO, QID, PRN    Vitamin B12 250 mcg oral tablet, 250 mcg= 1 tab(s), PO, Daily    Vitamin D3, 1000 Unit(s), PO, Daily    Diet    Cardiac Solid Diet - Ordered    -- 12/20/16 10:07:00 EDT, Room Service, Scheduled / PRN    Lab Results    Blood Glucose POC: 157 mg/dL High (56/38/75 64:33:29 EDT)    Glucose Lvl: 138 mg/dL High (51/88/41 66:06:30 EDT)    BUN: 15 mg/dL (16/01/09 32:35:57 EDT)    Creatinine: 1.38 mg/dL High (32/20/25 42:70:62 EDT)    Afn Amer Glomerular Filtration Rate: 47 ml/min/1.35m2 (12/20/16 00:07:00 EDT)    Brent Bulla  Amer Glomerular Filtration Rate: 40 ml/min/1.47m2 (12/20/16 00:07:00  EDT)    Sodium Lvl: 142 mmol/L (12/20/16 00:07:00 EDT)    Potassium Lvl: 4.2 mmol/L (12/20/16 00:07:00 EDT)    Chloride: 103 mmol/L (12/20/16 00:07:00 EDT)    CO2: 27 mmol/L (12/20/16 00:07:00 EDT)    Anion Gap: 12 High (12/20/16 00:07:00 EDT)    Total Protein: 5.3 Gm/dL Low (37/62/83 15:17:61 EDT)    Albumin Lvl: 2.1 Gm/dL Low (60/73/71 06:26:94 EDT)    Calcium Lvl: 8 mg/dL Low (85/46/27 03:50:09 EDT)    Magnesium Lvl: 2.1 mg/dL (38/18/29 93:71:69 EDT)    Bilirubin Total: 1.8 mg/dL High (67/89/38 10:17:51 EDT)    Alkaline Phosphatase: 358 Units/L High (12/20/16 00:07:00 EDT)    AST: 60 Units/L High (12/20/16 00:07:00 EDT)    ALT: 34 Units/L (12/20/16 00:07:00 EDT)    Iron: 78 mcg/dL (02/58/52 77:82:42 EDT)    TIBC: 102 mcg/dL Low (35/36/14 43:15:40 EDT)    % Iron Bound: 76 % High (12/20/16 00:07:00 EDT)    Ferritin Lvl: 889 nGm/ml High (12/20/16 00:07:00 EDT)  Total CK: 55 Units/L (12/20/16 00:07:00 EDT)    Ethanol Lvl: <3 (12/20/16 00:07:00 EDT)    WBC: 7 thous/mm3 (12/20/16 00:07:00 EDT)    RBC: 2.02 Mil/mm3 Low (12/20/16 00:07:00 EDT)    Hgb: 8.1 Gm/dL Low (60/45/40 98:11:91 EDT)    Hct: 24.9 % Low (12/20/16 10:15:00 EDT)    Platelet: 286 thous/mm3 (12/20/16 00:07:00 EDT)    MCV: 108 fL High (12/20/16 00:07:00 EDT)    MCH: 34 pGm High (12/20/16 00:07:00 EDT)    MCHC: 31.2 Gm/dL Low (47/82/95 62:13:08 EDT)    RDW: 16.1 % High (12/20/16 00:07:00 EDT)    MPV: 9.1 fL (12/20/16 00:07:00 EDT)    Neutrophils: 76 % High (12/20/16 00:07:00 EDT)    Lymphocytes: 12 % Low (12/20/16 00:07:00 EDT)    Monocytes: 11 % (12/20/16 00:07:00 EDT)    Eosinophils: 1 % (12/20/16 00:07:00 EDT)    Basophils: 0 % (12/20/16 00:07:00 EDT)    Immature Granulocytes: 0 % (12/20/16 00:07:00 EDT)    NRBC Percent: 0 % (12/20/16 00:07:00 EDT)    Hypochromia: Slight Abnormal (12/20/16 00:07:00 EDT)    Polychrom: Occasional Abnormal (12/20/16 00:07:00 EDT)    Anisocytosis:  Slight Abnormal (12/20/16 00:07:00 EDT)    Microcytes: Occasional Abnormal (12/20/16 00:07:00 EDT)    Macrocytes: Occasional Abnormal (12/20/16 00:07:00 EDT)    Ovalocytes: Occasional Abnormal (12/20/16 00:07:00 EDT)    Teardrops: Occasional Abnormal (12/20/16 00:07:00 EDT)    Basophil Stip: Occasional Abnormal (12/20/16 00:07:00 EDT)    Platelet Est: Adequate (12/20/16 00:07:00 EDT)    Absolute Neutro Count: 5310 /mm3 (12/20/16 00:07:00 EDT)    Blue Top Tube To Hold: DONE (12/20/16 00:07:00 EDT)    Isaias Sakai: A POS (12/20/16 01:48:00 EDT)    Antibody Screen: Negative (12/20/16 01:48:00 EDT)    Electronic Crossmatch: Computer XM OK (12/20/16 01:48:00 EDT)    Electronic Crossmatch: Computer XM OK (12/20/16 01:48:00 EDT)    Diagnostic Results        ------        [place] LINE Electronically signed by Lorin Picket MD, Dezeray Puccio on 12/20/2016 at  12:12:54 EST

## 2016-12-20 NOTE — Progress Notes (Signed)
Subjective    no new c/o    Objective    Vitals & Measurements    **T:** 97.7 F (Oral) **TMIN:** 97.4 F (Oral) **TMAX:** 100.2 F (Oral)  **HR:** 85 **RR:** 16 **BP:** 70/42 **SpO2:** 96% **O2 Rate:** 4 **O2 Method  (L/min):** Room air    Physical Exam    General: Alert and oriented, No acute distress.    Respiratory: Lungs are clear to auscultation, Respirations are non-labored.    Cardiovascular: Normal rate, Regular rhythm.    Gastrointestinal: Soft, Non-tender, Non-distended, Normal bowel sounds.    Integumentary: Warm, Dry.    Neurologic: Alert, Oriented.    Psychiatric: Cooperative    SKIN :Multiple petechiae to bilateral upper extremities and across upper  chest. Slight petechiae to the right cheek, Chronic for 2 weeks.    Superficial abrasion to the right forearm and left upper arm and forearm. No  lacerations, no active bleeding.    Medications    _Inpatient_    amitriptyline, 50 mg= 1 tab(s), PO, HS    amLODIPine, 10 mg= 2 tab(s), PO, Daily    bolus NS 500 mL, 500 mL, IV    budesonide 0.5 mg/2 mL inhalation suspension, 0.5 mg= 2 mL, NEB, BID    clonazePAM, 1 mg= 1 tab(s), PO, BID    Cymbalta, 60 mg= 2 cap(s), PO, Daily    docusate sodium, 100 mg= 1 cap(s), PO, BID    ferrous sulfate, 325 mg= 1 tab(s), PO, Daily    formoterol 20 mcg/2 mL inhalation solution, 20 mcg= 2 mL, NEB, BID    Latuda, 120 mg= 6 tab(s), PO, HS    Maalox, 30 mL, PO, q4hr, PRN    Metoprolol Tartrate (Lopressor), 50 mg= 1 tab(s), PO, BID    Milk of Magnesia 8% oral suspension, 2400 mg= 30 mL, PO, Daily, PRN    ondansetron, 4 mg= 2 mL, IV Push, q8hr, PRN    oxyCODONE, 10 mg= 1 tab(s), PO, BID    pantoprazole, 40 mg= 1 tab(s), PO, BID    Spiriva, 18 mcg= 1 cap(s), INHAL, Daily    Tylenol, 650 mg= 2 tab(s), PO, q8hr, PRN    Vitamin B12, 250 mcg= 2.5 tab(s), PO, Daily    Vitamin D3, 1000 Unit(s)= 1 tab(s), PO, Daily    Zanaflex, 4 mg= 1 tab(s), PO, QID, PRN    Zosyn    _Home_    amitriptyline, 50 mg, PO, HS    amLODIPine, 10 mg, PO,  Daily    atenolol, 50 mg, PO, Daily    Breo Ellipta 100 mcg-25 mcg/inh inhalation powder, 1 puff(s), INHAL, Daily    clonazePAM 1 mg oral tablet, 1 mg, PO, BID    Cymbalta 60 mg oral delayed release capsule, 60 mg= 1 cap(s), PO, Daily    ferrous gluconate 325 mg (36 mg elemental iron) oral tablet, 325 mg= 1 tab(s),  PO, Daily    furosemide, 20 mg, PO, Daily    Latuda, 120 mg, PO, HS    lisinopril, 40 mg, PO, Daily    omeprazole, 20 mg, PO, BID    oxyCODONE 10 mg oral tablet, 10 mg= 1 tab(s), PO, BID    Spiriva 18 mcg inhalation capsule, 18 mcg= 1 cap(s), INHAL, Daily    tiZANidine 4 mg oral tablet, 4 mg= 1 tab(s), PO, QID, PRN    Vitamin B12 250 mcg oral tablet, 250 mcg= 1 tab(s), PO, Daily    Vitamin D3, 1000 Unit(s), PO, Daily    Lab Results  Hgb: 7 Gm/dL Low (16/10/96 04:54:09 EDT)    Hct: 22.7 % Low (12/24/16 07:37:00 EDT)    Blue Top Tube To Hold: DONE (12/23/16 15:30:00 EDT)    Diagnostic Results    Impression and Plan    1\. Chronic back pain.    2\. Peripheral neuropathy.    3\. Scoliosis.    4\. Falls.    5\. Bipolar disorder.    6\. Coronary artery disease, status post bypass.    7\. Anxiety.    8\. Tobacco abuse disorder.    9\. Hypertension.    10\. COPD.    11\. Alcohol abuse disorder.    12\. chronic anemia.            66 year old female with past medical history of bipolar disorder, recurrent  falls, anxiety and hypertension, Recently had a prolonged hospitalization From  1/1-02/01/2017 for cholangitis, CBD stone obstruction, s/p PTBD  who presented  to the ED with Mechanical fall. Found to be profound anemia, hemoglobin 6.8.  Review of records reveal hemoglobin 8.6 in February. Lab showed mild renal  insufficiency with creatinine 1.3 with elevated LFTs.    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 Acute on chronic anemia    -hx Iron deficiency anemia.    -Guaiac stools negative in the ED    -No evidence of acute bleeding    -s/p 1 unit of blood    -h/h stable    -GI f/u    s/p-EGD 6/21    IMPRESSION AND PLAN:        Procedures under MAC.    EGD (not biopsied).        Significant ulcer in the distal esophagus at the Z line, with significant  exudate which also makes it difficult to ascertain the underlying etiology. No  biopsies taken, and I recommend twice a day PPI therapy along with liquid  Carafate, the PPI therapy twice daily ongoing until the time of next endoscopy  in approximately 8 weeks, liquid Carafate for the next 3 weeks, 3 times a day.        Is not clear to me at this moment when her most recent colonoscopy occurred,  and if it is been more than 24 months I would suggest EGD/colonoscopy at that  time.    [1]        3.Acute kidney injury likely prerenal    -Hold Lasix And ACE inhibitor    -Avoid nephrotoxin medication    -Status post gentle hydration    -Monitor creatinine closely        4.Elevated LFTs, Worsen prior admission 1/17    -Ultrasound abdomen    -Had a prolonged hospitalization for cholangitis CPD stone obstruction With drain placement    -GI f/u    MRCP results reviewed            #GI prophylaxis Protonix        DVT prophylaxis Sequential    CC 50 min    Transfer to [facility]/D4/PCU      SIGNATURE LINE Electronically signed by Leah Lory MD-HOSP, Leah [name] on  12/24/2016 at 10:48:01 EST

## 2016-12-21 LAB — HX CBC W/ DIFF
CASE NUMBER: 2018170000326
HX HCT: 28.6 % — ABNORMAL LOW (ref 36.0–47.0)
HX HGB: 9.2 g/dL — ABNORMAL LOW (ref 11.8–15.8)
HX MCH: 33 pg — ABNORMAL HIGH (ref 27.0–31.0)
HX MCHC: 32.2 g/dL — NL (ref 32.0–36.0)
HX MCV: 103 fL — ABNORMAL HIGH (ref 81.0–99.0)
HX MPV: 8.7 fL — NL (ref 7.4–11.5)
HX NRBC PERCENT: 0 % — NL
HX PLATELET: 304 10*3/uL — NL (ref 150.0–400.0)
HX RBC: 2.78 10*6/uL — ABNORMAL LOW (ref 3.6–5.0)
HX RDW: 19.1 % — ABNORMAL HIGH (ref 11.5–14.5)
HX WBC: 5.4 10*3/uL — NL (ref 3.7–11.2)

## 2016-12-21 LAB — HX BASIC METABOLIC PANEL
CASE NUMBER: 2018170000326
HX ANION GAP: 9 — NL (ref 3.0–11.0)
HX BUN: 11 mg/dL — NL (ref 7.0–18.0)
HX CALCIUM LVL: 7.5 mg/dL — ABNORMAL LOW (ref 8.5–10.1)
HX CHLORIDE: 106 mmol/L — NL (ref 98.0–110.0)
HX CO2: 27 mmol/L — NL (ref 21.0–32.0)
HX CREATININE: 0.888 mg/dL — NL (ref 0.55–1.3)
HX GLUCOSE LVL: 84 mg/dL — NL (ref 65.0–110.0)
HX POTASSIUM LVL: 4.4 mmol/L — NL (ref 3.6–5.2)
HX SODIUM LVL: 142 mmol/L — NL (ref 136.0–145.0)

## 2016-12-21 LAB — HX HEMOGLOBIN AND HEMATOCRIT
CASE NUMBER: 2018170000013
CASE NUMBER: 2018170001845
HX HCT: 27.4 % — ABNORMAL LOW (ref 36.0–47.0)
HX HCT: 29.7 % — ABNORMAL LOW (ref 36.0–47.0)
HX HGB: 8.8 g/dL — ABNORMAL LOW (ref 11.8–15.8)
HX HGB: 9.6 g/dL — ABNORMAL LOW (ref 11.8–15.8)

## 2016-12-21 LAB — HX GLOMERULAR FILTRATION RATE (ESTIMATED)
CASE NUMBER: 2018170000326
HX AFN AMER GLOMERULAR FILTRATION RATE: 79 mL/min/{1.73_m2}
HX NON-AFN AMER GLOMERULAR FILTRATION RATE: 69 mL/min/{1.73_m2}

## 2016-12-21 LAB — HX PTT
CASE NUMBER: 2018170000326
HX PTT: 26.7 s — NL (ref 23.4–32.1)

## 2016-12-21 LAB — HX .AUTOMATED DIFF
CASE NUMBER: 2018170000326
HX ABSOLUTE NEUTRO COUNT: 3520 /mm3
HX BASOPHILS: 0 % — NL (ref 0.0–1.0)
HX EOSINOPHILS: 2 % — NL (ref 0.0–3.0)
HX IMMATURE GRANULOCYTES: 0 % — NL (ref 0.0–2.0)
HX LYMPHOCYTES: 24 % — NL (ref 22.0–40.0)
HX MONOCYTES: 8 % — NL (ref 0.0–11.0)
HX NEU CT MEASURED: 3.52
HX NEUTROPHILS: 65 % — NL (ref 40.0–71.0)

## 2016-12-21 LAB — HX PT
CASE NUMBER: 2018170000326
HX INR: 1.1
HX PT: 11.1 s — NL (ref 9.3–11.6)

## 2016-12-22 LAB — HX CBC W/ INDICES
CASE NUMBER: 16629169
HX HCT: 29.6 % — ABNORMAL LOW (ref 36.0–47.0)
HX HGB: 9.4 g/dL — ABNORMAL LOW (ref 11.8–15.8)
HX MCH: 34 pg — ABNORMAL HIGH (ref 27.0–31.0)
HX MCHC: 31.8 g/dL — ABNORMAL LOW (ref 32.0–36.0)
HX MCV: 105 fL — ABNORMAL HIGH (ref 81.0–99.0)
HX MPV: 8.9 fL — NL (ref 7.4–11.5)
HX NRBC PERCENT: 0 % — NL
HX PLATELET: 372 10*3/uL — NL (ref 150.0–400.0)
HX RBC: 2.81 10*6/uL — ABNORMAL LOW (ref 3.6–5.0)
HX RDW: 18.5 % — ABNORMAL HIGH (ref 11.5–14.5)
HX WBC: 7.3 10*3/uL — NL (ref 3.7–11.2)

## 2016-12-22 LAB — HX HEPATIC FUNCTION PANEL
CASE NUMBER: 2018171000377
HX ALBUMIN LVL: 2.1 g/dL — ABNORMAL LOW (ref 3.5–5.0)
HX ALKALINE PHOSPHATASE: 306 U/L — ABNORMAL HIGH (ref 45.0–117.0)
HX ALT: 25 U/L — NL (ref 6.0–78.0)
HX AST: 28 U/L — NL (ref 6.0–40.0)
HX BILIRUBIN DIRECT: 0.7 mg/dL — ABNORMAL HIGH (ref 0.0–0.3)
HX BILIRUBIN TOTAL: 1.4 mg/dL — ABNORMAL HIGH (ref 0.2–1.2)
HX TOTAL PROTEIN: 5.7 g/dL — ABNORMAL LOW (ref 6.0–8.0)

## 2016-12-22 LAB — HX HEMOGLOBIN AND HEMATOCRIT
CASE NUMBER: 2018171000186
CASE NUMBER: 2018171001768
HX HCT: 29.1 % — ABNORMAL LOW (ref 36.0–47.0)
HX HCT: 33.4 % — ABNORMAL LOW (ref 36.0–47.0)
HX HGB: 10.5 g/dL — ABNORMAL LOW (ref 11.8–15.8)
HX HGB: 9.5 g/dL — ABNORMAL LOW (ref 11.8–15.8)

## 2016-12-22 LAB — HX RETICULOCYTE COUNT AUTOMATED
CASE NUMBER: 2018171000376
HX ABSOLUTE RETIC COUNT: 182 10*3/uL — ABNORMAL HIGH (ref 35.0–100.0)
HX RET CT MEASURED: 0.182
HX RETIC COUNT PERCENT: 6.4 % — ABNORMAL HIGH (ref 0.8–2.0)

## 2016-12-22 LAB — HX HAPTOGLOBIN
CASE NUMBER: 2018171000376
HX HAPTOGLOBIN: <8 — ABNORMAL LOW

## 2016-12-22 LAB — HX VITAMIN B12 LEVEL
CASE NUMBER: 2018171000377
HX VITAMIN B12 LVL: 1946 pg/mL — ABNORMAL HIGH (ref 193.0–986.0)

## 2016-12-22 LAB — HX FOLATE LEVEL
CASE NUMBER: 2018171000377
HX FOLATE LVL: >20 — NL (ref 5.9–20.0)

## 2016-12-23 LAB — HX HEMOGLOBIN AND HEMATOCRIT
CASE NUMBER: 2018172000099
CASE NUMBER: 2018172000478
CASE NUMBER: 2018172001723
HX HCT: 26.9 % — ABNORMAL LOW (ref 36.0–47.0)
HX HCT: 27.2 % — ABNORMAL LOW (ref 36.0–47.0)
HX HCT: 27.7 % — ABNORMAL LOW (ref 36.0–47.0)
HX HGB: 8.3 g/dL — ABNORMAL LOW (ref 11.8–15.8)
HX HGB: 8.7 g/dL — ABNORMAL LOW (ref 11.8–15.8)
HX HGB: 8.7 g/dL — ABNORMAL LOW (ref 11.8–15.8)

## 2016-12-23 LAB — HX URINALYSIS (COMPLETE)
CASE NUMBER: 2018171002845
HX UA BILIRUBIN: NEGATIVE — NL
HX UA BLOOD: NEGATIVE — NL
HX UA GLUCOSE: NEGATIVE — NL
HX UA HYALINE CASTS: 5 /LPF — ABNORMAL HIGH (ref 0.0–4.0)
HX UA KETONES: NEGATIVE — NL
HX UA LEUKOCYTE ESTERASE: NEGATIVE — NL
HX UA NITRITE: NEGATIVE — NL
HX UA PH: 6 — NL (ref 5.0–8.0)
HX UA PROTEIN: NEGATIVE — NL
HX UA RBC: <1 — NL (ref 0.0–3.0)
HX UA SPECIFIC GRAVITY: 1.014 — NL (ref 1.005–1.03)
HX UA SQUAMOUS EPITHELIAL: 1 /HPF — NL (ref 0.0–5.0)
HX UA UROBILINOGEN: 4 — AB
HX UA WBC: 1 /HPF — NL (ref 0.0–5.0)

## 2016-12-23 LAB — HX SST GOLD TUBE TO HOLD
CASE NUMBER: 2018172000478
CASE NUMBER: 2018172001723

## 2016-12-23 LAB — HX LACTIC ACID
CASE NUMBER: 2018171002890
HX LACTIC ACID LVL: 1.3 mmol/L — NL (ref 0.4–2.0)

## 2016-12-23 LAB — HX BLUE TOP TO HOLD: CASE NUMBER: 2018172001723

## 2016-12-24 LAB — HX ZZANTIBODY SCREEN
CASE NUMBER: 2018173001504
HX ANTIBODY SCREEN: NEGATIVE — NL
HX G1: 0 — NL
HX G2: 0 — NL
HX G3: 0 — NL

## 2016-12-24 LAB — HX CROSSMATCH: CASE NUMBER: 2018173001504

## 2016-12-24 LAB — HX LACTIC ACID
CASE NUMBER: 2018173001504
HX LACTIC ACID LVL: 1.2 mmol/L — NL (ref 0.4–2.0)

## 2016-12-24 LAB — HX .ABO/RH TYPE TUBE
CASE NUMBER: 2018173001504
HX A1: 0 — NL
HX ABORH: A POS — NL
HX ANTI-B: 0 — NL

## 2016-12-24 LAB — HX HEMOGLOBIN AND HEMATOCRIT
CASE NUMBER: 2018173000118
CASE NUMBER: 2018173000566
CASE NUMBER: 2018173001783
HX HCT: 22.6 % — ABNORMAL LOW (ref 36.0–47.0)
HX HCT: 22.7 % — ABNORMAL LOW (ref 36.0–47.0)
HX HCT: 23.1 % — ABNORMAL LOW (ref 36.0–47.0)
HX HGB: 7 g/dL — ABNORMAL LOW (ref 11.8–15.8)
HX HGB: 7 g/dL — ABNORMAL LOW (ref 11.8–15.8)
HX HGB: 7.1 g/dL — ABNORMAL LOW (ref 11.8–15.8)

## 2016-12-24 LAB — HX CBC W/ DIFF
CASE NUMBER: 2018173001504
HX HCT: 22 % — ABNORMAL LOW (ref 36.0–47.0)
HX HGB: 6.8 g/dL — CR (ref 11.8–15.8)
HX MCH: 34 pg — ABNORMAL HIGH (ref 27.0–31.0)
HX MCHC: 30.9 g/dL — ABNORMAL LOW (ref 32.0–36.0)
HX MCV: 109 fL — ABNORMAL HIGH (ref 81.0–99.0)
HX MPV: 8.1 fL — NL (ref 7.4–11.5)
HX NRBC PERCENT: 0 % — NL
HX PLATELET: 162 10*3/uL — NL (ref 150.0–400.0)
HX RBC: 2.02 10*6/uL — ABNORMAL LOW (ref 3.6–5.0)
HX RDW: 18.8 % — ABNORMAL HIGH (ref 11.5–14.5)
HX WBC: 4.5 10*3/uL — NL (ref 3.7–11.2)

## 2016-12-24 LAB — HX .REDCELL
CASE NUMBER: 2018173002204
HX ADDITIONAL UNITS TO CROSSMATCH: 0 — NL
HX NUMBER OF UNITS TO TRANSFUSE: 1

## 2016-12-24 LAB — HX .AUTOMATED DIFF
CASE NUMBER: 2018173001504
HX ABSOLUTE NEUTRO COUNT: 3390 /mm3
HX BASOPHILS: 0 % — NL (ref 0.0–1.0)
HX EOSINOPHILS: 1 % — NL (ref 0.0–3.0)
HX IMMATURE GRANULOCYTES: 0 % — NL (ref 0.0–2.0)
HX LYMPHOCYTES: 14 % — ABNORMAL LOW (ref 22.0–40.0)
HX MONOCYTES: 8 % — NL (ref 0.0–11.0)
HX NEU CT MEASURED: 3.39
HX NEUTROPHILS: 76 % — ABNORMAL HIGH (ref 40.0–71.0)

## 2016-12-24 LAB — HX BASIC METABOLIC PANEL
CASE NUMBER: 2018173001504
HX ANION GAP: 6 — NL (ref 3.0–11.0)
HX BUN: 10 mg/dL — NL (ref 7.0–18.0)
HX CALCIUM LVL: 6.7 mg/dL — CR (ref 8.5–10.1)
HX CHLORIDE: 111 mmol/L — ABNORMAL HIGH (ref 98.0–110.0)
HX CO2: 26 mmol/L — NL (ref 21.0–32.0)
HX CREATININE: 1.03 mg/dL — NL (ref 0.55–1.3)
HX GLUCOSE LVL: 82 mg/dL — NL (ref 65.0–110.0)
HX POTASSIUM LVL: 4.2 mmol/L — NL (ref 3.6–5.2)
HX SODIUM LVL: 143 mmol/L — NL (ref 136.0–145.0)

## 2016-12-24 LAB — HX SST GOLD TUBE TO HOLD
CASE NUMBER: 2018173000566
CASE NUMBER: 2018173001783

## 2016-12-24 LAB — HX GLOMERULAR FILTRATION RATE (ESTIMATED)
CASE NUMBER: 2018173001504
HX AFN AMER GLOMERULAR FILTRATION RATE: 66 mL/min/{1.73_m2}
HX NON-AFN AMER GLOMERULAR FILTRATION RATE: 58 mL/min/{1.73_m2}

## 2016-12-24 LAB — HX ALBUMIN LEVEL
CASE NUMBER: 16639308
HX ALBUMIN LVL: 1.6 g/dL — ABNORMAL LOW (ref 3.5–5.0)

## 2016-12-24 LAB — HX METHYLMALONIC ACID
CASE NUMBER: 2018171000376
HX METHYLMALONIC ACID: 0.19 umol/L

## 2016-12-25 LAB — HX HEMOGLOBIN AND HEMATOCRIT
CASE NUMBER: 2018174000063
CASE NUMBER: 2018174000593
HX HCT: 25.8 % — ABNORMAL LOW (ref 36.0–47.0)
HX HCT: 27.5 % — ABNORMAL LOW (ref 36.0–47.0)
HX HGB: 8.1 g/dL — ABNORMAL LOW (ref 11.8–15.8)
HX HGB: 8.8 g/dL — ABNORMAL LOW (ref 11.8–15.8)

## 2016-12-25 LAB — HX COMPREHENSIVE METABOLIC PANEL
CASE NUMBER: 2018174000593
HX ALBUMIN LVL: 1.5 g/dL — ABNORMAL LOW (ref 3.5–5.0)
HX ALKALINE PHOSPHATASE: 337 U/L — ABNORMAL HIGH (ref 45.0–117.0)
HX ALT: 41 U/L — NL (ref 6.0–78.0)
HX ANION GAP: 7 — NL (ref 3.0–11.0)
HX AST: 44 U/L — ABNORMAL HIGH (ref 6.0–40.0)
HX BILIRUBIN TOTAL: 1.3 mg/dL — ABNORMAL HIGH (ref 0.2–1.2)
HX BUN: 8 mg/dL — NL (ref 7.0–18.0)
HX CALCIUM LVL: 6.3 mg/dL — CR (ref 8.5–10.1)
HX CHLORIDE: 114 mmol/L — ABNORMAL HIGH (ref 98.0–110.0)
HX CO2: 25 mmol/L — NL (ref 21.0–32.0)
HX CREATININE: 0.744 mg/dL — NL (ref 0.55–1.3)
HX GLUCOSE LVL: 72 mg/dL — NL (ref 65.0–110.0)
HX POTASSIUM LVL: 3.8 mmol/L — NL (ref 3.6–5.2)
HX SODIUM LVL: 146 mmol/L — ABNORMAL HIGH (ref 136.0–145.0)
HX TOTAL PROTEIN: 4.3 g/dL — ABNORMAL LOW (ref 6.0–8.0)

## 2016-12-25 LAB — HX CALCIUM LVL IONIZED
CASE NUMBER: 2018174000840
HX CALCIUM LVL IONIZED PH 7.4: 0.98 mmol/L — ABNORMAL LOW (ref 1.1–1.3)
HX CALCIUM LVL IONIZED: 1.02 mmol/L — ABNORMAL LOW (ref 1.1–1.3)
HX PH FOR IONIZED CA: 7.33 — ABNORMAL LOW (ref 7.35–7.45)

## 2016-12-25 LAB — HX GLOMERULAR FILTRATION RATE (ESTIMATED)
CASE NUMBER: 2018174000593
HX AFN AMER GLOMERULAR FILTRATION RATE: >90
HX NON-AFN AMER GLOMERULAR FILTRATION RATE: 85 mL/min/{1.73_m2}

## 2016-12-25 LAB — HX WOUND CULTURE
CASE NUMBER: 2018171002292
HX F: NORMAL
HX P: NORMAL

## 2016-12-25 LAB — HX BLUE TOP TO HOLD: CASE NUMBER: 2018174000593

## 2016-12-25 LAB — HX SST GOLD TUBE TO HOLD: CASE NUMBER: 2018174000593

## 2017-01-13 NOTE — Progress Notes (Signed)
* * *        **Leah [name]**    ------    66 Y old Female, DOB: [birth year]    Account Number: [id number]    [address] [place], [place]    Home: [phone]    Guarantor: Leah [name] Insurance: [facility] [employer]    Appointment Facility: Nolon Bussing DO        * * *    01/13/2017    Progress Note: Cherre Robins, D.O.     ------    ---        **Reason for Appointment**    ---      1. Follow-up    ---      **History of Present Illness**    ---    _Constitutional_ :    Leah [name] presents today for a follow up. She was not able to get blood work done  for today's visit. She is following up on her mood and anxiety. Patient has no  other concerns or complaints today.      **Current Medications**    ---    Taking     * ProAir HFA 108 (90 Base) MCG/ACT Aerosol Solution 2 puffs as needed Inhalation every 6 hrs     ---    * Latuda 120 MG Tablet 1 tablet Orally Once a day     ---    * Amitriptyline HCl 50 MG Tablet 1 tablet Orally Once a day     ---    * Klonopin 1 MG Tablet TAKE 1 TABLET BY MOUTH 3 TIMES A DAY FOR 14 DAYS Orally two times a day     ---    * Oxycodone HCl 10 MG Tablet 1 tablet Orally Twice a day     ---    * Furosemide 20 MG Tablet 1 tablet Orally Once a day, Notes: Mon, Wed,Fri 40 mg 20mg  other days     ---    * HydrOXYzine HCl 25 MG Tablet 1 tablet as needed Orally every 8 hrs     ---    * Lisinopril 40 MG Tablet 1 tablet Orally Once a day     ---    * Omeprazole 20 MG Capsule Delayed Release TAKE ONE CAPSULE BY MOUTH TWICE A DAY     ---    * Ferrous Gluconate 325 (36 Fe) MG Tablet 1 tablet Orally Twice a day     ---    * Atenolol 50 MG Tablet 1 tablet Orally Once a day     ---    * Vitamin D 2000 UNIT Capsule 1 tablet Orally Once a day     ---    * Tizanidine HCl 4 MG Tablet 1 tablet as needed Orally Three to four times a day     ---    Not-Taking    * Abilify 15 MG Tablet 1 TAB Orally daily     ---    * Pravastatin Sodium  40MG  1 TAB ORAL daily     ---    * Omeprazole 20MG  Tablet Delayed Release 1 TAB ORAL twice daily     ---    * Albuterol Sulfate (2.5 MG/3ML) 0.083% Nebulization Solution 3 ml Inhalation Three times a day     ---    * Advair Diskus 500-50 MCG/DOSE Aerosol Powder Breath Activated INHALE 1 PUFF TWICE A DAY     ---    *  Abilify 10mg  tablet TAKE 1 TABLET BY MOUTH ONCE A DAY     ---    * Spiriva HandiHaler 18 MCG 1 CAP Inhalation daily     ---    * Aripiprazole 15 MG Tablet TAKE 1 TABLET BY MOUTH EVERY DAY     ---    * Zanaflex 4MG  1 TAB ORAL 2-3 times daily     ---    Discontinued    * Amlodipine Besylate 10 MG Tablet TAKE 1 TABLET BY MOUTH DAILY     ---    * Amitriptyline HCl 50 MG Tablet 1 tablet Orally Once a day at bedtime     ---    * Clonazepam 1 MG Tablet TAKE 1 TABLET BY MOUTH THREE TIMES A DAY FOR 2 WEEKS     ---    * Medication List reviewed and reconciled with the patient     ---      **Past Medical History**    ---      COPD.        ---    Bipolar.        ---    Substance abuse.        ---    Encephalopthy.        ---    Nondependent tobacco use disorder.        ---    Other and unspecified hyperlipidemia.        ---    Essential hypertension, benign.        ---    Lumbago.        ---    Esophageal reflux.        ---    Dietary surveillance and counseling.        ---    Exercise counseling.        ---    Other counseling, not elsewhere classified.        ---    Overweight.        ---    Hypertension.        ---    Hyperlipidemia.        ---    Other abnormal glucose.        ---    Fatigue.        ---    Vitamin D deficiency.        ---    Personal history of tobacco use, presenting hazards to health.        ---    COPD-Stable.        ---    Cough.        ---    Cough.        ---      **Family History**    ---      Father: deceased, lung cancer    ---    Mother: deceased, bone cancer    ---    1 brother(s) , 1 sister(s) - healthy.    ---    husband passed 10/02/14-heart attack 53yo.    ---      **Social  History**    ---    _Quality 2017:_    Tobacco Use/Smoking    Are you a _former smoker_    How long has it been since you last smoked? _&lt; 1 month_    Depression Evaluation (PHQ-9) Little interest or pleasure in doing things:  Nearly every day , Feeling down, depressed, or hopeless: Nearly every day,  Trouble  falling or staying asleep, or sleeping too much: Nearly every day,  Feeling tired or having little energy: Nearly every day, Poor appetite or  overeating: Several days, Feeling bad about yourself, or that you are a  failure, or have let yourself or your family down: Not at all, Trouble  concentrating on things, such as reading the newspaper or watching television:  Nearly every day, Moving or speaking so slowly that other people could have  noticed. Or the opposite, being so fidgety or restless that you are moving  around a lot more than usual : Not at all, Thoughts that you would be better  off dead, or of hurting yourself in some way: Not at all, Total Score:: 16,  Interpretation:: Moderately severe depression.    Depression Screening (PHQ-2) Little interest or pleasure in doing things: Yes,  Feeling down, depressed, or hopeless: Yes.    Information Last Updated    Date: _03/19/2018_    Date: _03/19/2018_    smoked for 40 yrs 2 packs per day.      **Hospitalization/Major Diagnostic Procedure**    ---      [facility] - fall, altered mental status injury of head 09/17/2014    ---      **Vital Signs**    ---    Wt 175.8, Ht 61 in, BMI  33.21 Index  , BP 126/66 mm Hg, HR 74 /min, Oxygen  sat % 98 %, Ht-cm 154.94 cm, Wt-kg 79.74 kg.      **Examination**    ---    _General Examination_ :    NECK/THYROID:  normal  ,  carotid pulse normal  ,  neck supple, full range of  motion  ,  no carotid bruit  ,  no cervical lymphadenopathy  ,  no jugular  venous distention  ,  no lymphadenopathy  ,  no thyroid nodules  ,  no  thyromegaly  ,  normal carotid upstroke  ,  normal jugular venous distention  ,  thyroid  nontender  ,  thyroid normal  ,  trachea midline  .    SKIN:  normal  .    HEART:  normal other than 1-2/6 murmur  ,  no clicks  ,  no jugular venous  distention  ,  no rubs, gallops  ,  point of maximal impulse normal  ,  regular rate and rhythm  .    LUNGS:  normal  ,  clear anteriorly and posteriorly  ,  clear to auscultation  bilaterally  ,  good air movement  ,  no wheezes, rales, rhonchi  .    CHEST:  normal  ,  axillary nodes normal  ,  clear to auscultation and  percussion  ,  no costochondral tenderness  ,  no gross rib deformity  ,  normal anteroposterior (AP) diameter  ,  normal shape and expansion  .    EXTREMITIES:  Trace Edema bilaterally  .          **Assessments**    ---    1. Hypertension - I10 (Primary)    ---    2. Hyperlipidemia - E78.5    ---    3. B12 deficiency - E53.8    ---    4. Fe deficiency anemia - D50.9    ---    5. Fatigue, unspecified type - R53.83    ---    6. Lumbar back pain with radiculopathy affecting left lower extremity -  M54.17    ---  7. Hyperglycemia - R73.9    ---    8. Transaminasemia - R74.0    ---    9. Bruising - T14.8XXA    ---    10. Lumbago with sciatica, right side - M54.41    ---    11. Lumbago with sciatica, left side - M54.42    ---    12. Low back pain - M54.5    ---    13. Pain in right lower leg - M79.661    ---    14. Pain in left lower leg - M79.662    ---    15. Hx of smoking - Z87.891    ---    16. Screening for colon cancer - Z12.11    ---      **Treatment**    ---      **1. Hypertension**    Continue Oxycodone HCl Tablet, 10 MG, 1 tablet, Orally, Twice a day    _LAB: COMPREHENSIVE METABOLIC PANEL_    _LAB: FE_    _LAB: CBC AND DIFFERENTIAL_    _IMAGING: ABI_    _IMAGING: Stress Echo_    _IMAGING: EKG_    Notes: BP stable, continue current medications.    ---        **2. Hyperlipidemia**    _LAB: COMPREHENSIVE METABOLIC PANEL_    _LAB: FE_    _LAB: CBC AND DIFFERENTIAL_    _IMAGING: Stress Echo_    Notes: Labs given. Patient to continue low fat, low  cholesterol diet and  exercise as tolerated.        **3. B12 deficiency**    _LAB: COMPREHENSIVE METABOLIC PANEL_    _LAB: FE_    _LAB: CBC AND DIFFERENTIAL_    Notes: Labs given, continue B12 supplement.        **4. Fe deficiency anemia**    _LAB: COMPREHENSIVE METABOLIC PANEL_    _LAB: FE_    _LAB: CBC AND DIFFERENTIAL_    Notes: Labs given. Continue Iron supplement.        **5. Fatigue, unspecified type**    _LAB: COMPREHENSIVE METABOLIC PANEL_    _LAB: FE_    _LAB: CBC AND DIFFERENTIAL_    _IMAGING: Stress Echo_    Notes: Patient's fatigue has not changed. Encouraged a good diet, exercise as  tolerated. Labs given.        **6. Lumbar back pain with radiculopathy affecting left lower extremity**    _IMAGING: MRITHORACICSPINEANDLUMBARSPINEWWOCONTRAST_    Notes: Patient continues with chronic back pain. Patient given Oxycodone and  will continue with that. MRI order sent.        **7. Hyperglycemia**    _IMAGING: CTABDOMENANDPELVISWWOCONTRAST_     Notes :Tor Netters 02/15/2017 9:16:03 AM &gt; I left 2 messages with Okey Regal and spoke with her nurse about the importance of this test! Her nurse is Lanora Manis [phone]     ------        Notes: Labs given.        **8. Transaminasemia**    _IMAGING: CTABDOMENANDPELVISWWOCONTRAST_     Notes :Tor Netters 02/15/2017 9:16:03 AM &gt; I left 2 messages with Okey Regal and spoke with her nurse about the importance of this test! Her nurse is Lanora Manis [phone]     ------        Notes: Patient given labs. Patient to have CT of abdomen and pelvis with and  without contrast.        **9. Bruising**    _IMAGING: CTABDOMENANDPELVISWWOCONTRAST_     Notes :Tor Netters 02/15/2017  9:16:03 AM &gt; I left 2 messages with Mckell and spoke with her nurse about the importance of this test! Her nurse is Lanora Manis [phone]     ------        Notes: Labs given.        **10. Lumbago with sciatica, right side**    _IMAGING: ABI_    Notes: Patient continues with sciatica.  Patient will have ABI's performed.        **11. Lumbago with sciatica, left side**    _IMAGING: MRITHORACICSPINEANDLUMBARSPINEWWOCONTRAST_    Notes: Patient continues with sciatica. Patient will have ABI's performed.        **12. Low back pain**    _IMAGING: ABI_    _IMAGING: MRITHORACICSPINEANDLUMBARSPINEWWOCONTRAST_        **13. Pain in right lower leg**    _IMAGING: ABI_    _IMAGING: EMG Bilateral Legs_    _IMAGING: MRITHORACICSPINEANDLUMBARSPINEWWOCONTRAST_        **14. Pain in left lower leg**    _IMAGING: ABI_    _IMAGING: EMG Bilateral Legs_    _IMAGING: MRITHORACICSPINEANDLUMBARSPINEWWOCONTRAST_        **15. Hx of smoking**    _IMAGING: CTLOWDOSELUNGSCREENINGWOCONTRAST_    Notes: Patient will have screening low dose CT scan for history of smoking.        **16. Screening for colon cancer**    _IMAGING: Colonoscopy_    Notes: Patient to have screening colonoscopy.    Electronically signed by Jolaine Artist on 05/30/2018 at 09:25 AM CST    Sign off status: Completed        * * *        Nolon Bussing DO    [address]    [place], [place] [id number]    Tel: [phone]    Fax: [phone]              * * *          Patient: Leah [name], Leah [name] DOB: 10-01-1950 Progress Note: Cherre Robins, D.O. 01/13/2017    ---    Note generated by eClinicalWorks EMR/PM Software ([email or web address])

## 2017-01-30 ENCOUNTER — Ambulatory Visit: Admitting: Family Medicine

## 2017-02-08 NOTE — Progress Notes (Signed)
* * *    Leah [name], Leah [name] **DOB:** Aug 04, 1950 (66 yo F) **Acc No.** [id number] **DOS:**  02/08/2017    ---        Leah [name]**    ------    74 Y old Female, DOB: 02/08/1951    Account Number: [id number]    [address], [place], [place]    Home: [phone]    Guarantor: [place], [place] Insurance: [employer] Payer ID: [id number]    Appointment Facility: Nolon Bussing DO        * * *    02/08/2017    Progress Notes: [name], D.O.     ------    ---        **Current Medications**    ---    Taking     * ProAir HFA 108 (90 Base) MCG/ACT Aerosol Solution 2 puffs as needed Inhalation every 6 hrs     ---    * Amitriptyline HCl 50 MG Tablet 1 tablet Orally Once a day     ---    * hydrOXYzine HCl 25 MG Tablet 1 tablet as needed Orally every 8 hrs     ---    * Lisinopril 40 MG Tablet 1 tablet Orally Once a day     ---    * Omeprazole 20 MG Capsule Delayed Release TAKE ONE CAPSULE BY MOUTH TWICE A DAY     ---    * tiZANidine HCl 4 MG Tablet 1 tablet as needed Orally Three to four times a day     ---    * oxyCODONE HCl 10 MG Tablet 1 tablet Orally Twice a day     ---    * Amitriptyline HCl 50 mg Amitriptyline HCI TAKE 1 TABLET BY MOUTH EVERY DAY Orally Once a day     ---    * Furosemide 20 MG Tablet 1 tablet Orally Once a day , Notes to Pharmacist: Mon, Wed,Fri 40 mg 20mg  other days     ---    * KlonoPIN 1 MG Tablet TAKE 1 TABLET BY MOUTH 3 TIMES A DAY FOR 14 DAYS Orally two times a day     ---    * Ferrous Gluconate 325 (36 Fe) MG Tablet 1 tablet Orally Twice a day     ---    * Atenolol 50 MG Tablet 1 tablet Orally Once a day     ---    * Vitamin D 2000 UNIT Capsule 1 tablet Orally Once a day     ---    * Latuda 120 MG Tablet 1 tablet Orally Once a day     ---    * clonazePAM 1 MG Tablet TAKE 1 TABLET BY MOUTH 3 TIMES A DAY FOR 14 DAYS, THEN TWICE A DAY FOR 30 DAYS     ---    Not-Taking    * Abilify 15 MG Tablet 1 TAB Orally daily     ---    * Pravastatin Sodium 40MG  1  TAB ORAL daily     ---    * Omeprazole 20MG  Tablet Delayed Release 1 TAB ORAL twice daily     ---    * Albuterol Sulfate (2.5 MG/3ML) 0.083% Nebulization Solution 3 ml Inhalation Three times a day     ---    * Advair Diskus 500-50 MCG/DOSE Aerosol Powder Breath Activated INHALE 1 PUFF TWICE A DAY     ---    *  Abilify 10mg  tablet TAKE 1 TABLET BY MOUTH ONCE A DAY     ---    * Spiriva HandiHaler 18 MCG 1 CAP Inhalation daily     ---    * ARIPiprazole 15 MG Tablet TAKE 1 TABLET BY MOUTH EVERY DAY     ---    * Zanaflex 4MG  1 TAB ORAL 2-3 times daily     ---        **Reason for Appointment**    ---      1. 1 month f/u    ---      **History of Present Illness**    ---    _Constitutional_ :    Can cross legs and get in a car better, no recent falls. Mood is good, feels  like current regimen is good. Counselor cancelled first and she cancelled  second appt. Hasn't heard from her in 1 wk. Pt will call her again.      **Assessments**    ---    1. Hypertension - I10 (Primary)    ---    2. Leukocytosis, unspecified type - D72.829    ---    3. Elevated platelet count - R79.89    ---    4. Renal insufficiency - N28.9    ---      **Treatment**    ---      **1. Hypertension**    Continue oxyCODONE HCl Tablet, 10 MG, 1 tablet, Orally, Twice a day, 30, 60,  Refills 0    _LAB: Peripheral Blood Smear_    _LAB: CBC AND DIFFERENTIAL_    _LAB: COMPREHENSIVE METABOLIC PANEL (Collection Date &amp; Time - 02/16/2017  11:51 AM)_    _LAB: D-DIMER (Collection Date &amp; Time - 02/16/2017 11:51 AM)_    _LAB: PROTIME-INR (Collection Date &amp; Time - 02/16/2017 11:51 AM)_    ---        **2. Leukocytosis, unspecified type**    _LAB: Peripheral Blood Smear_    _LAB: CBC AND DIFFERENTIAL_    _LAB: COMPREHENSIVE METABOLIC PANEL (Collection Date &amp; Time - 02/16/2017  11:51 AM)_    _LAB: D-DIMER (Collection Date &amp; Time - 02/16/2017 11:51 AM)_    _LAB: PROTIME-INR (Collection Date &amp; Time - 02/16/2017 11:51 AM)_        **3. Elevated  platelet count**    _LAB: Peripheral Blood Smear_    _LAB: CBC AND DIFFERENTIAL_    _LAB: COMPREHENSIVE METABOLIC PANEL (Collection Date &amp; Time - 02/16/2017  11:51 AM)_    _LAB: D-DIMER (Collection Date &amp; Time - 02/16/2017 11:51 AM)_    _LAB: PROTIME-INR (Collection Date &amp; Time - 02/16/2017 11:51 AM)_        **4. Renal insufficiency**    _LAB: Peripheral Blood Smear_    _LAB: CBC AND DIFFERENTIAL_    _LAB: COMPREHENSIVE METABOLIC PANEL (Collection Date &amp; Time - 02/16/2017  11:51 AM)_    _LAB: D-DIMER (Collection Date &amp; Time - 02/16/2017 11:51 AM)_    _LAB: PROTIME-INR (Collection Date &amp; Time - 02/16/2017 11:51 AM)_    Electronically signed by Nolon Bussing , DO on 11/20/2022 at 03:31 PM  CDT    Sign off status: Completed        * * *        Romana Juniper Bowman DO    [address]    [address]    [place], [place] [address]    Tel: [phone]    Fax: [phone]              * * *  Progress Note: Cherre Robins, D.O. 02/08/2017    ---    Note generated by eClinicalWorks EMR/PM Software ([email or web address])

## 2017-03-11 ENCOUNTER — Inpatient Hospital Stay
Admit: 2017-03-11 | Disposition: A | Discharge status: Skilled Nursing Facility | Source: Home / Self Care | Attending: Internal Medicine | Admitting: Internal Medicine

## 2017-03-11 LAB — HX C-REACTIVE PROTEIN (CRP)
CASE NUMBER: 16909040
HX C-REACTIVE PROTEIN: 1.1 mg/dL — ABNORMAL HIGH (ref 0.0–0.8)

## 2017-03-11 LAB — HX .AUTOMATED DIFF
CASE NUMBER: 2018250001954
HX ABSOLUTE BASO COUNT: 0.02 10*3/uL — NL (ref 0.0–0.22)
HX ABSOLUTE EOS COUNT: 0.04 10*3/uL — NL (ref 0.0–0.45)
HX ABSOLUTE LYMPHS COUNT: 1.18 10*3/uL — NL (ref 0.74–5.04)
HX ABSOLUTE MONO COUNT: 1.05 10*3/uL — NL (ref 0.0–1.34)
HX ABSOLUTE NEUTRO COUNT: 10.29 10*3/uL — ABNORMAL HIGH (ref 1.48–7.95)
HX BASOPHILS: 0.2 %
HX EOSINOPHILS: 0.3 %
HX IMMATURE GRANULOCYTES: 0.6 % — NL (ref 0.0–2.0)
HX LYMPHOCYTES: 9.3 %
HX MONOCYTES: 8.3 %
HX NEUTROPHILS: 81.3 %

## 2017-03-11 LAB — HX COMPREHENSIVE METABOLIC PANEL
CASE NUMBER: 2018250001954
HX ALBUMIN LVL: 3 g/dL — ABNORMAL LOW (ref 3.5–5.0)
HX ALKALINE PHOSPHATASE: 99 U/L — NL (ref 45.0–117.0)
HX ALT: 20 U/L — NL (ref 6.0–78.0)
HX ANION GAP: 14 — ABNORMAL HIGH (ref 3.0–11.0)
HX AST: 25 U/L — NL (ref 6.0–40.0)
HX BILIRUBIN TOTAL: 1.2 mg/dL — NL (ref 0.2–1.2)
HX BUN: 45 mg/dL — ABNORMAL HIGH (ref 7.0–18.0)
HX CALCIUM LVL: 7.4 mg/dL — ABNORMAL LOW (ref 8.5–10.1)
HX CHLORIDE: 92 mmol/L — ABNORMAL LOW (ref 98.0–110.0)
HX CO2: 23 mmol/L — NL (ref 21.0–32.0)
HX CREATININE: 4.44 mg/dL — CR (ref 0.55–1.3)
HX GLUCOSE LVL: 132 mg/dL — ABNORMAL HIGH (ref 65.0–110.0)
HX POTASSIUM LVL: 6.1 mmol/L — CR (ref 3.6–5.2)
HX SODIUM LVL: 129 mmol/L — ABNORMAL LOW (ref 136.0–145.0)
HX TOTAL PROTEIN: 6.5 g/dL — NL (ref 6.0–8.0)

## 2017-03-11 LAB — HX .ABO/RH TYPE TUBE
CASE NUMBER: 2018250001954
HX A1: 0 — NL
HX ABORH: A POS — NL
HX ANTI-B: 0 — NL

## 2017-03-11 LAB — HX LACTIC ACID
CASE NUMBER: 2018250002503
CASE NUMBER: 2018250002783
HX LACTIC ACID LVL: 2.1 mmol/L — ABNORMAL HIGH (ref 0.4–2.0)
HX LACTIC ACID LVL: 4.7 mmol/L — ABNORMAL HIGH (ref 0.4–2.0)

## 2017-03-11 LAB — HX BASIC METABOLIC PANEL
CASE NUMBER: 2018250002500
HX ANION GAP: 9 — NL (ref 3.0–11.0)
HX BUN: 39 mg/dL — ABNORMAL HIGH (ref 7.0–18.0)
HX CALCIUM LVL: 7.9 mg/dL — ABNORMAL LOW (ref 8.5–10.1)
HX CHLORIDE: 100 mmol/L — NL (ref 98.0–110.0)
HX CO2: 27 mmol/L — NL (ref 21.0–32.0)
HX CREATININE: 3.36 mg/dL — ABNORMAL HIGH (ref 0.55–1.3)
HX GLUCOSE LVL: 178 mg/dL — ABNORMAL HIGH (ref 65.0–110.0)
HX POTASSIUM LVL: 4.2 mmol/L — NL (ref 3.6–5.2)
HX SODIUM LVL: 136 mmol/L — NL (ref 136.0–145.0)

## 2017-03-11 LAB — HX ZZANTIBODY SCREEN
CASE NUMBER: 2018250001954
HX ANTIBODY SCREEN: NEGATIVE — NL
HX G1: 0 — NL
HX G2: 0 — NL
HX G3: 0 — NL

## 2017-03-11 LAB — HX CBC W/ DIFF
CASE NUMBER: 2018250001954
HX ABSOLUTE NRBC COUNT: 0 10*3/uL
HX HCT: 21.8 % — CR (ref 36.0–47.0)
HX HGB: 7.1 g/dL — ABNORMAL LOW (ref 11.8–16.0)
HX MCH: 35.9 pg — ABNORMAL HIGH (ref 26.0–34.0)
HX MCHC: 32.6 g/dL — NL (ref 31.0–37.0)
HX MCV: 110.1 fL — ABNORMAL HIGH (ref 80.0–100.0)
HX MPV: 9.3 fL — ABNORMAL LOW (ref 9.4–12.4)
HX NRBC PERCENT: 0 % — NL
HX PLATELET: 289 10*3/uL — NL (ref 150.0–400.0)
HX RBC: 1.98 10*6/uL — ABNORMAL LOW (ref 3.9–5.2)
HX RDW-CV: 17.2 % — ABNORMAL HIGH (ref 11.5–14.5)
HX RDW-SD: 69.2 fL — ABNORMAL HIGH (ref 35.0–51.0)
HX WBC: 12.6 10*3/uL — ABNORMAL HIGH (ref 3.7–11.2)

## 2017-03-11 LAB — HX URINE DIPSTICK W/REFLEX
CASE NUMBER: 2018250001955
HX UA BILIRUBIN: NEGATIVE — NL
HX UA BLOOD: NEGATIVE — NL
HX UA GLUCOSE: NEGATIVE — NL
HX UA HYALINE CASTS: 2 /LPF — NL (ref 0.0–4.0)
HX UA KETONES: NEGATIVE — NL
HX UA LEUKOCYTE ESTERASE: NEGATIVE — NL
HX UA NITRITE: NEGATIVE — NL
HX UA PH: 6 — NL (ref 5.0–8.0)
HX UA PROTEIN: NEGATIVE — NL
HX UA RBC: 1 /HPF — NL (ref 0.0–2.0)
HX UA SPECIFIC GRAVITY: 1.008 — NL (ref 1.003–1.03)
HX UA SQUAMOUS EPITHELIAL: <1 — NL (ref 0.0–5.0)
HX UA UROBILINOGEN: 4 — AB
HX UA WBC: <1 — NL (ref 0.0–5.0)

## 2017-03-11 LAB — HX GLOMERULAR FILTRATION RATE (ESTIMATED)
CASE NUMBER: 2018250001954
HX AFN AMER GLOMERULAR FILTRATION RATE: 11 mL/min/{1.73_m2}
HX NON-AFN AMER GLOMERULAR FILTRATION RATE: 10 mL/min/{1.73_m2}

## 2017-03-11 LAB — HX .REDCELL
CASE NUMBER: 2018250002091
HX ADDITIONAL UNITS TO CROSSMATCH: 0 — NL
HX NUMBER OF UNITS TO TRANSFUSE: 0

## 2017-03-11 LAB — HX TROPONIN I
CASE NUMBER: 2018250001954
HX TROPONIN I: 0.024 ng/mL — NL (ref 0.015–0.045)

## 2017-03-11 LAB — HX CREATINE KINASE
CASE NUMBER: 2018250001954
HX TOTAL CK: 135 U/L — NL (ref 39.0–308.0)

## 2017-03-11 NOTE — Progress Notes (Signed)
Subjective    patient feels much better today        patient complains of right sided abd pain    Review of Systems    Objective    Vitals & Measurements    **T:** 98.1 F (Oral) **TMIN:** 98.0 F (Oral) **TMAX:** 98.8 F (Oral)  **HR:** 78 **RR:** 14 **BP:** 149/77 **SpO2:** 93% **O2 Method (L/min):** Room  air    Physical Exam    General: AAO x 3, not in acute distress, appears to have a depressed mood    Eye: PERRL    HEENT: Normocephalic, moist oral mucosa    Neck: supple, no JVD    Respiratory: Good bilateral air entry. +bibasilar crackles    Cardiovascular: s1 + s2, RRR, no murmurs. 1+ edema    Gastrointestinal: BS+, soft, non tender, non-distended, no rebound or guarding    Genitourinary: No suprapubic or CVA tenderness    Integumentary: scattered ecchymoses    Neurologic: no focal deficits [1]    Medications    _Inpatient_    acetaminophen tablet, 650 mg= 2 tab(s), PO, q4hr, PRN    Al hydroxide/Mg hydroxide/simethicone, 15 mL, PO, q4hr, PRN    albuterol-ipratropium 2.5 mg-0.5 mg/3 mL inhalation solution, 3 mL, NEB, q4hr,  PRN    amitriptyline, 50 mg= 1 tab(s), PO, HS    atenolol, 50 mg= 1 tab(s), PO, Daily    budesonide 0.5 mg/2 mL inhalation suspension, 0.5 mg= 2 mL, NEB, BID    clonazePAM, 1 mg= 1 tab(s), PO, BID    Culturelle [employer] oral capsule, 1 cap(s), PO, Daily    Cymbalta, 60 mg= 2 cap(s), PO, Daily    Dilaudid, 0.25 mg= 0.25 mL, IV Push, q6hr, PRN    docusate sodium, 100 mg= 1 cap(s), PO, BID    ferrous sulfate, 325 mg= 1 tab(s), PO, BID    formoterol 20 mcg/2 mL inhalation solution, 20 mcg= 2 mL, NEB, BID    heparin, 5000 Unit(s)= 1 mL, sc, q8hr    Lasix, 40 mg, PO, Daily    Latuda, 120 mg= 6 tab(s), PO, HS    Norvasc, 5 mg= 1 tab(s), PO, Daily    ondansetron, 4 mg= 2 mL, IV Push, q8hr, PRN    oxyCODONE, 10 mg= 2 tab(s), PO, BID    pantoprazole, 40 mg= 1 tab(s), PO, BID    tiZANidine, 4 mg= 1 tab(s), PO, QID, PRN    Vitamin B12, 250 mcg= 2.5 tab(s), PO, Daily    Vitamin D3, 1000  Unit(s)= 1 tab(s), PO, Daily    _Home_    aluminum hydroxide/magnesium hydroxide/simethicone 200 mg-200 mg-20 mg/5 mL  oral suspension, 30 mL, PO, q4hr, PRN    amitriptyline, 50 mg, PO, HS    amLODIPine, 5 mg, PO, Once    atenolol, 50 mg, PO, BID    Breo Ellipta 100 mcg-25 mcg/inh inhalation powder, 1 puff(s), INHAL, Daily    clonazePAM 1 mg oral tablet, 1 mg, PO, BID, **Investigating**    Cymbalta 60 mg oral delayed release capsule, 60 mg= 1 cap(s), PO, Daily    ferrous gluconate 325 mg (36 mg elemental iron) oral tablet, 325 mg= 1 tab(s),  PO, BID    Lasix, 40 mg, PO, Daily    Lasix 20 mg oral tablet, 20 mg= 1 tab(s), PO, Daily    Latuda, 120 mg, PO, HS    ondansetron 2 mg/mL injectable solution, 4 mg= 2 mL, IV Push, q8hr, PRN    oxyCODONE 10 mg oral tablet,  10 mg= 1 tab(s), PO, BID    pantoprazole 40 mg oral delayed release tablet, 40 mg= 1 tab(s), PO, BID    Spiriva 18 mcg inhalation capsule, 18 mcg= 1 cap(s), INHAL, Daily    sucralfate 1 g oral tablet, 1 Gm= 1 tab(s), PO, QID    tiZANidine 4 mg oral tablet, 4 mg= 1 tab(s), PO, QID, PRN    Vitamin B12 250 mcg oral tablet, 250 mcg= 1 tab(s), PO, Daily    Vitamin D3, 1000 Unit(s), PO, Daily    Lab Results    Glucose Lvl: 104 mg/dL (16/10/96 04:54:09 EDT)    BUN: 20 mg/dL High (81/19/14 78:29:56 EDT)    Creatinine: 1.34 mg/dL High (21/30/86 57:84:69 EDT)    Afn Amer Glomerular Filtration Rate: 48 ml/min/1.40m2 (03/13/17 08:45:00 EDT)    Brent Bulla Amer Glomerular Filtration Rate: 42 ml/min/1.64m2 (03/13/17 08:45:00  EDT)    Sodium Lvl: 141 mmol/L (03/13/17 08:45:00 EDT)    Potassium Lvl: 4.1 mmol/L (03/13/17 08:45:00 EDT)    Chloride: 109 mmol/L (03/13/17 08:45:00 EDT)    CO2: 25 mmol/L (03/13/17 08:45:00 EDT)    Anion Gap: 7 (03/13/17 08:45:00 EDT)    Calcium Lvl: 7.1 mg/dL Low (62/95/28 41:32:44 EDT)    Phosphorus: 3 mg/dL (07/06/70 53:66:44 EDT)    Magnesium Lvl: 2.3 mg/dL (03/47/42 59:56:38 EDT)    Iron: 53 mcg/dL (75/64/33 29:51:88 EDT)    TIBC: 146 mcg/dL Low  (41/66/06 30:16:01 EDT)    % Iron Bound: 36 % (03/13/17 08:45:00 EDT)    Ferritin Lvl: 1390 nGm/ml High (03/13/17 08:45:00 EDT)    Folate Lvl: >20.0 (03/13/17 08:45:00 EDT)    Vitamin B12 Lvl: 1374 pg/mL High (03/13/17 08:45:00 EDT)    WBC: 5.7 thous/mm3 (03/13/17 08:45:00 EDT)    RBC: 1.98 Mil/mm3 Low (03/13/17 08:45:00 EDT)    Hgb: 7.1 Gm/dL Low (09/32/35 57:32:20 EDT)    Hct: 22.2 % Low (03/13/17 08:45:00 EDT)    Platelet: 207 thous/mm3 (03/13/17 08:45:00 EDT)    MCV: 112.1 fL High (03/13/17 08:45:00 EDT)    MCH: 35.9 pGm High (03/13/17 08:45:00 EDT)    MCHC: 32 Gm/dL (25/42/70 62:37:62 EDT)    RDW-SD: 67.8 fL High (03/13/17 08:45:00 EDT)    MPV: 8.8 fL Low (03/13/17 08:45:00 EDT)    NRBC Percent: 0 % (03/13/17 08:45:00 EDT)    Absolute NRBC Count: 0 thous/mm3 (03/13/17 08:45:00 EDT)    Retic Count Percent: 8.7 % (03/13/17 08:45:00 EDT)    Absolute Retic Count: 0.1731 Mil/mm3 High (03/13/17 08:45:00 EDT)    PT (AMS): 11.2 sec (03/13/17 08:45:00 EDT)    INR (AMS): 1.1 (03/13/17 08:45:00 EDT)            CT abd 9/9    IMPRESSION:        1\. No evidence of bowel obstruction. Note is made of the presence of contrast    within the excluded portion of the stomach in this patient who is undergone a    previous Roux-en-Y gastric bypass. This finding is most commonly due to    retrograde reflux but may also be seen in patients who have a gastrogastric    fistula. Consider further evaluation with nonemergent fluoroscopic upper    gastrointestinal study after the patient's barium contrast has cleared.        2\. Cholecystectomy changes.        3\. Small bilateral pleural effusions and pulmonary vascular congestion which    raise the possibility of congestive heart failure exacerbation in this  patient    with cardiomegaly.    [2]    Diagnostic Results    Impression and Plan    Knee abrasion            66 year old unfortunate woman who lives at home alone and gets VNA services.  She reports poor appetite for several days and  also reports falls at home.  Yesterday she fell and fire department were at her home to get her off the  floor, although she did not come to ER. She has reported low BP and VNA saw  her earlier today. She called 911 today and was brought here. She was found by  Paramedics to be bradycardic with HR40s and pauses. A 12 lead EKG done in ER  Showed SB with Antero-Lateral T wave abnormality and She was also hypotensive.  She was given 3L bolus NS and Labs later returned showing mild hyperkalemia  6.1 and in [facility] and the ER physician concluded that her cardiac abnormality and  hypotension was due to the potassium abnormality and she received usual  intervention for acute hyperkalemia. She was also given IV Lasix by ED  physician.    A cardiology consultation was requested by ER and she was seen by Dr Katrinka Blazing who  also concluded that her mildly elevated potassium may explain her symptoms and  EKG findings.    The patient was alert and oriented during her entire ER stay per ER staff. She  did inform me that she has not been having an appetite for days and has been  taking her medications including Atenolol 50mg  and Lisinopril 40 mg and  Amlodipine 5mg . She also take Lasix 40mg  in AM and 20 mg in the pm which is a  new addition from her PCP. She took all her AM medications today prior to  coming to ER except her Lasix. She has been feeling Dizzy for days and has had  several falls, but denies hitting head. She has multiple bruises on her skin.  She does have chronic anemia and gets blood transfusions and last blood  product given within the past 2 weeks. She denies melena or vomiting coffee  brown material. She denies Alcoholism. She is an active smoker and has COPD.  She denies DVT hx and not on anticoagulation per patient and external  medication list reviewed personally.    Hgb 7.1 is noted which is not unusual for her close to her needing blood.  Denies fever or chills or urinary symptoms not associated with lasix  therapy.  She is admitted for further management. Blood pressure has normalized after  fluid boluses initially given.                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 continue to monitor off abx    -Resumed atenolol 50 mg daily (unclear if patient takes BID). Resume lasix tomorrow, Resume norvasc with holding parameters    -home psych meds resumed. Mood much improved today likely related to resolution of kidney injury. Appreciated psych input- weight change as reported by patient not accurate as per chart review    -Avoid benzos given history of falls. PT eval. Patient states that she has recently transitioned from using cane to using a walker    -hem/oncology evaluation for anemia, reticulocytosis, abnormal CT and MR imaging in the past    -check stool occult blood test    -nutrition consult    -social work consult for frequent falls at home     -supportive care, repositioning, skin care, fall risk protocol, pain control, incentive spirometry            DVT ppx: heparin    PPI                FULL CODE        Ok to transfer to [employer] [employer]. PT following    [1] Progress/SOAP Note; Gerrie Nordmann MD 03/12/2017 15:39 EDT    [2] CT Abdomen and Pelvis C-; Dobre MD, M Cristian 03/13/2017 16:20 EDT    [3] Progress/SOAP  Note; Allena Katz Altonio Schwertner MD 03/12/2017 15:39 EDT    SIGNATURE LINE Electronically signed by Allena Katz MD, Tanish Sinkler on 03/13/2017 at  17:35:02 EST

## 2017-03-11 NOTE — Consults (Signed)
Chief Complaint    Reason for Consultation            Impression:    Acute renal failure    Hyperkalemia    Intermittent sinus arrest with hyperkalemia    Marked anemia    Liver disease, question alcoholic    Bipolar disorder    Marked anemia    Hypocalcemia    Weakness, falls        Plan:    66 year old woman presents with weakness, falls which is not a new complaint  for her. Found to have acute renal failure hyperkalemia and slow junctional  rhythm. Rhythm is artery improved into an normal sinus rhythm with an adequate  rate with simple hydration.        Patient has a history of mental illness and apparently although she has been  taking her medications has not been eating or drinking much over the past  several weeks. She says she has lost some weight, unintentionally.        Would repeat echocardiogram. Would treat hyperkalemia which has improved her  sinus arrest. Gentle hydration, follow creatinine. Renal consult.        Falls difficult to tease out, sound mechanical, secondary to weakness and not  syncope.. Would however keep on telemetry will she is here looking for  arrhythmias.        Would also evaluate/treat severe anemia. Would concentrate on ultimate social  situation given the fact that she appears to have put herself in this  significantly ill state        Chief Complaint:    "I kept falling."    HPI:    66 year old with complex medical history including bipolar disorder, prolonged  hospitalization over the summer for liver disease, severe anemia, ID issues.        Spent several weeks in the hospital with severe anemia, gram-negative sepsis,  cholangitis.        Apparently has not been eating or drinking much in the past week. Says she has  lost some 30 pounds, unintentionally. She states that she came in with  weakness.        She says that her "legs give out," and she is weak and she collapses on the  ground. She says she feels so weak she could not stand up. She denies syncope.  Denies much  in the way of presyncope. Fortunately no major head trauma.        She does have a fairly extensive history of frequent falls.        Past Medical History    Bipolar disorder    Peripheral neuropathy    Chronic low back pain    Cholangitis with common bile duct stone obstruction    history of multiple falls    History of severe anemia    Question prior history of substance abuse    COPD            Social History    Quit smoking 6 months ago. Nonspecific about alcohol use-says "I like my  screwdrivers."        Family History    High blood pressure and family        Review of systems:    Patient unreliable. Not much of history can be obtained. Says she has lost  some 30 pounds in the past year. Falls as above. Says she has not been  drinking or eating much. Denies edema chest pain orthopnea PND.  Physical Exam    Vitals (see below)    General: Chronically ill-appearing, unkempt, lying in bed in no major  distress, overweight    HEENT: No obvious trauma. EOMI PERRLA    Neck: Supple no JVD or bruits    Cardiac: Regular, currently heart rate in the 60s, no murmurs. PMI  nondisplaced    Lungs: Decreased breath sounds at bases otherwise clear    Abdomen: Protuberant distended abdomen nontender    Extremities: Trace edema bilaterally    Neuro: Lying comfortably in no distress    Psych: Appears oriented in no distress    Skin: Multiple abrasions, contusions, skin tears, primarily on legs        Data    EKG: (Personally reviewed) slow junctional escape, narrow with occasional  conducted PACs, nonspecific ST-T wave abnormalities    Chest x-ray: Personally reviewed    FINDINGS:        The heart is normal in size. There is no focal pulmonary consolidation. No    significant pleural effusion or pneumothorax is demonstrated on this single    view. Old right rib fractures.            Echocardiogram: From January Interpretation Summary    The left ventricle is normal in size. There is normal left ventricular wall    thickness.     Left ventricular systolic function is normal. The estimated EF = 55%. The left    ventricular wall motion is normal.        Since previous report 06/16/16, apical and septal hypokinesis not seen          Physician Requesting Consult    History of Present Illness    Review of Systems    Code Status    No qualifying data available.    Physical Exam    Vitals & Measurements    **HR:** 43 **RR:** 15 **BP:** 85/31 **SpO2:** 100% **WT:** 77.11 Kg    Impression and Plan    BRADYCARDIA    FALL    HYPOTENSION        Problem List/Past Medical History    _Ongoing_    Tobacco use    _Historical_    No qualifying data    Procedure/Surgical History    EGD with Anesthesia (N/A) (12/23/2016), Inspection of Upper Intestinal Tract,  Via Natural or Artificial Opening Endoscopic (12/23/2016), Transfusion of  Nonautologous Red Blood Cells into Peripheral Vein, Percutaneous Approach  (12/20/2016), Drainage of Common Bile Duct with Drainage Device, Percutaneous  Approach (08/06/2016), Removal of Drainage Device from Hepatobiliary Duct,  External Approach (08/06/2016), Excision of Ampulla of Vater, Percutaneous  Approach, Diagnostic (08/05/2016), Drainage of Common Bile Duct with Drainage  Device, Percutaneous Approach (07/29/2016), Fluoroscopy of Biliary and  Pancreatic Ducts using Other Contrast (07/29/2016), TRAN NAUTO RED BLD CLL  PERIPH VN PC (10/04/2014), Gastric bypass surgery (2012).    Social History    _Alcohol_    Current, 1-2 times per week    Past    _Substance Abuse_    Never    _Tobacco_    Current every day smoker, Cigarettes    Family History    High blood pressure: Father, Sister and Brother.    Allergies    NKA    Medications    _Inpatient_    albuterol 2.5 mg/3 mL (0.083%) inhalation solution, 2.5 mg= 3 mL, NEB, Once    bolus NS 1,000 mL, 1000 mL, IV    bolus NS 1,000 mL, 1000 mL, IV  bolus NS 1,000 mL, 1000 mL, IV    insulin regular, 10 Unit(s)= 0.1 mL, IV Push, Once    Kayexalate, 30 Gm= 120 mL, PO,  Once    Sodium Chloride 0.9% 250 mL, 250 mL, IV    _Home_    aluminum hydroxide/magnesium hydroxide/simethicone 200 mg-200 mg-20 mg/5 mL  oral suspension, 30 mL, PO, q4hr, PRN    amitriptyline, 50 mg, PO, HS    Breo Ellipta 100 mcg-25 mcg/inh inhalation powder, 1 puff(s), INHAL, Daily    clonazePAM 1 mg oral tablet, 1 mg, PO, BID    Cymbalta 60 mg oral delayed release capsule, 60 mg= 1 cap(s), PO, Daily    ferrous gluconate 325 mg (36 mg elemental iron) oral tablet, 325 mg= 1 tab(s),  PO, Daily    Latuda, 120 mg, PO, HS    ondansetron 2 mg/mL injectable solution, 4 mg= 2 mL, IV Push, q8hr, PRN    oxyCODONE 10 mg oral tablet, 10 mg= 1 tab(s), PO, BID    pantoprazole 40 mg oral delayed release tablet, 40 mg= 1 tab(s), PO, BID    Spiriva 18 mcg inhalation capsule, 18 mcg= 1 cap(s), INHAL, Daily    sucralfate 1 g oral tablet, 1 Gm= 1 tab(s), PO, QID    tiZANidine 4 mg oral tablet, 4 mg= 1 tab(s), PO, QID, PRN    Vitamin B12 250 mcg oral tablet, 250 mcg= 1 tab(s), PO, Daily    Vitamin D3, 1000 Unit(s), PO, Daily    Diet    No qualifying data available.    Lab Results    Glucose Lvl: 132 mg/dL High (16/10/96 04:54:09 EDT)    BUN: 45 mg/dL High (81/19/14 78:29:56 EDT)    Creatinine: 4.44 mg/dL Critical (21/30/86 57:84:69 EDT)    Afn Amer Glomerular Filtration Rate: 11 ml/min/1.76m2 (03/11/17 14:45:00 EDT)    Brent Bulla Amer Glomerular Filtration Rate: 10 ml/min/1.31m2 (03/11/17 14:45:00  EDT)    Sodium Lvl: 129 mmol/L Low (03/11/17 14:45:00 EDT)    Potassium Lvl: 6.1 mmol/L Critical (03/11/17 14:45:00 EDT)    Chloride: 92 mmol/L Low (03/11/17 14:45:00 EDT)    CO2: 23 mmol/L (03/11/17 14:45:00 EDT)    Anion Gap: 14 High (03/11/17 14:45:00 EDT)    Total Protein: 6.5 Gm/dL (62/95/28 41:32:44 EDT)    Albumin Lvl: 3 Gm/dL Low (07/06/70 53:66:44 EDT)    Calcium Lvl: 7.4 mg/dL Low (03/47/42 59:56:38 EDT)    Bilirubin Total: 1.2 mg/dL (75/64/33 29:51:88 EDT)    Alkaline Phosphatase: 99 Units/L (03/11/17 14:45:00 EDT)    AST: 25  Units/L (03/11/17 14:45:00 EDT)    ALT: 20 Units/L (03/11/17 14:45:00 EDT)    Total CK: 135 Units/L (03/11/17 14:45:00 EDT)    Troponin I: 0.024 nGm/ml (03/11/17 14:45:00 EDT)    WBC: 12.6 thous/mm3 High (03/11/17 14:45:00 EDT)    RBC: 1.98 Mil/mm3 Low (03/11/17 14:45:00 EDT)    Hgb: 7.1 Gm/dL Low (41/66/06 30:16:01 EDT)    Hct: 21.8 % Critical (03/11/17 14:45:00 EDT)    Platelet: 289 thous/mm3 (03/11/17 14:45:00 EDT)    MCV: 110.1 fL High (03/11/17 14:45:00 EDT)    MCH: 35.9 pGm High (03/11/17 14:45:00 EDT)    MCHC: 32.6 Gm/dL (09/32/35 57:32:20 EDT)    RDW-SD: 69.2 fL High (03/11/17 14:45:00 EDT)    MPV: 9.3 fL Low (03/11/17 14:45:00 EDT)    Absolute Neutro Count: 10.29 thous/mm3 High (03/11/17 14:45:00 EDT)    Absolute Lymphs Count: 1.18 thous/mm3 (03/11/17 14:45:00 EDT)    Absolute Mono Count: 1.05 thous/mm3 (  03/11/17 14:45:00 EDT)    Absolute Eos Count: 0.04 thous/mm3 (03/11/17 14:45:00 EDT)    Absolute Baso Count: 0.02 thous/mm3 (03/11/17 14:45:00 EDT)    Neutrophils: 81.3 % (03/11/17 14:45:00 EDT)    Lymphocytes: 9.3 % (03/11/17 14:45:00 EDT)    Monocytes: 8.3 % (03/11/17 14:45:00 EDT)    Eosinophils: 0.3 % (03/11/17 14:45:00 EDT)    Basophils: 0.2 % (03/11/17 14:45:00 EDT)    Immature Granulocytes: 0.6 % (03/11/17 14:45:00 EDT)    NRBC Percent: 0 % (03/11/17 14:45:00 EDT)    Absolute NRBC Count: 0 thous/mm3 (03/11/17 14:45:00 EDT)    Diagnostic Results        ------        SIGNATURE LINE Electronically signed by Katrinka Blazing MD, Sharlet Salina on 03/11/2017 at  16:54:10 EST

## 2017-03-11 NOTE — Progress Notes (Signed)
Subjective    patient reports 40 lb weight loss over the past 5 weeks and mentions that she  has had a very poor appetite    Review of Systems    Objective    Vitals & Measurements    **T:** 98.4 F (Oral) **TMIN:** 98.1 F (Oral) **TMAX:** 98.8 F (Oral)  **HR:** 92 **RR:** 19 **BP:** 103/55 **SpO2:** 99% **O2 Rate:** 2 **O2 Method  (L/min):** Room air **WT:** 89.5 Kg    Physical Exam    General: AAO x 3, not in acute distress, appears to have a depressed mood    Eye: PERRL    HEENT: Normocephalic, moist oral mucosa    Neck: supple, no JVD    Respiratory: Good bilateral air entry. +bibasilar crackles    Cardiovascular: s1 + s2, RRR, no murmurs. 1+ edema    Gastrointestinal: BS+, soft, non tender, non-distended, no rebound or guarding    Genitourinary: No suprapubic or CVA tenderness    Integumentary: scattered ecchymoses    Neurologic: no focal deficits    Medications    _Inpatient_    acetaminophen tablet, 650 mg= 2 tab(s), PO, q4hr, PRN    albuterol-ipratropium 2.5 mg-0.5 mg/3 mL inhalation solution, 3 mL, NEB, q4hr,  PRN    amitriptyline, 50 mg= 1 tab(s), PO, HS    budesonide 0.5 mg/2 mL inhalation suspension, 0.5 mg= 2 mL, NEB, BID    clonazePAM, 1 mg= 1 tab(s), PO, BID    Culturelle Digest[employer] oral capsule, 1 cap(s), PO, Daily    Cymbalta, 60 mg= 2 cap(s), PO, Daily    docusate sodium, 100 mg= 1 cap(s), PO, BID    ferrous sulfate, 325 mg= 1 tab(s), PO, BID    formoterol 20 mcg/2 mL inhalation solution, 20 mcg= 2 mL, NEB, BID    heparin, 5000 Unit(s)= 1 mL, sc, q8hr    Latuda, 120 mg= 6 tab(s), PO, HS    ondansetron, 4 mg= 2 mL, IV Push, q8hr, PRN    oxyCODONE, 10 mg= 2 tab(s), PO, BID    pantoprazole, 40 mg= 1 tab(s), PO, BID    sucralfate, 1 Gm= 1 tab(s), PO, QID    tiZANidine, 4 mg= 1 tab(s), PO, QID, PRN    Vitamin B12, 250 mcg= 2.5 tab(s), PO, Daily    Vitamin D3, 1000 Unit(s)= 1 tab(s), PO, Daily    _Home_    aluminum hydroxide/magnesium hydroxide/simethicone 200 mg-200 mg-20 mg/5 mL  oral  suspension, 30 mL, PO, q4hr, PRN    amitriptyline, 50 mg, PO, HS    amLODIPine, 5 mg, PO, Once    atenolol, 50 mg, PO, BID    Breo Ellipta 100 mcg-25 mcg/inh inhalation powder, 1 puff(s), INHAL, Daily    clonazePAM 1 mg oral tablet, 1 mg, PO, BID, **Investigating**    Cymbalta 60 mg oral delayed release capsule, 60 mg= 1 cap(s), PO, Daily    ferrous gluconate 325 mg (36 mg elemental iron) oral tablet, 325 mg= 1 tab(s),  PO, BID    Lasix, 40 mg, PO, Daily    Lasix 20 mg oral tablet, 20 mg= 1 tab(s), PO, Daily    Latuda, 120 mg, PO, HS    ondansetron 2 mg/mL injectable solution, 4 mg= 2 mL, IV Push, q8hr, PRN    oxyCODONE 10 mg oral tablet, 10 mg= 1 tab(s), PO, BID    pantoprazole 40 mg oral delayed release tablet, 40 mg= 1 tab(s), PO, BID    Spiriva 18 mcg inhalation capsule, 18 mcg= 1  cap(s), INHAL, Daily    sucralfate 1 g oral tablet, 1 Gm= 1 tab(s), PO, QID    tiZANidine 4 mg oral tablet, 4 mg= 1 tab(s), PO, QID, PRN    Vitamin B12 250 mcg oral tablet, 250 mcg= 1 tab(s), PO, Daily    Vitamin D3, 1000 Unit(s), PO, Daily    Lab Results    Glucose Lvl: 154 mg/dL High (84/13/24 40:10:27 EDT)    BUN: 31 mg/dL High (25/36/64 40:34:74 EDT)    Creatinine: 2.2 mg/dL High (25/95/63 87:56:43 EDT)    Afn Amer Glomerular Filtration Rate: 26 ml/min/1.53m2 (03/12/17 06:20:00 EDT)    Brent Bulla Amer Glomerular Filtration Rate: 23 ml/min/1.86m2 (03/12/17 06:20:00  EDT)    Sodium Lvl: 138 mmol/L (03/12/17 06:20:00 EDT)    Potassium Lvl: 4.8 mmol/L (03/12/17 06:20:00 EDT)    Chloride: 106 mmol/L (03/12/17 06:20:00 EDT)    CO2: 24 mmol/L (03/12/17 06:20:00 EDT)    Anion Gap: 8 (03/12/17 06:20:00 EDT)    Total Protein: 4.7 Gm/dL Low (32/95/18 84:16:60 EDT)    Albumin Lvl: 2.2 Gm/dL Low (63/01/60 10:93:23 EDT)    Calcium Lvl: 6.5 mg/dL Critical (55/73/22 02:54:27 EDT)    Phosphorus: 5.1 mg/dL High (12/26/74 28:31:51 EDT)    Magnesium Lvl: 2.1 mg/dL (76/16/07 37:10:62 EDT)    Bilirubin Total: 0.5 mg/dL (69/48/54 62:70:35 EDT)    Bilirubin  Direct: 0.3 mg/dL (00/93/81 82:99:37 EDT)    Alkaline Phosphatase: 79 Units/L (03/12/17 06:20:00 EDT)    LDH: 154 Units/L (03/12/17 06:20:00 EDT)    AST: 15 Units/L (03/12/17 06:20:00 EDT)    ALT: 14 Units/L (03/12/17 06:20:00 EDT)    Lactic Acid Lvl: 1.4 mmol/L (03/12/17 06:20:00 EDT)    Cortisol Lvl: 59 mcg/dL (16/96/78 93:81:01 EDT)    WBC: 7.2 thous/mm3 (03/12/17 06:20:00 EDT)    RBC: 1.92 Mil/mm3 Low (03/12/17 06:20:00 EDT)    Hgb: 7.1 Gm/dL Low (75/10/25 85:27:78 EDT)    Hct: 21.7 % Critical (03/12/17 06:20:00 EDT)    Platelet: 195 thous/mm3 (03/12/17 06:20:00 EDT)    MCV: 113 fL High (03/12/17 06:20:00 EDT)    MCH: 37 pGm High (03/12/17 06:20:00 EDT)    MCHC: 32.7 Gm/dL (24/23/53 61:44:31 EDT)    RDW-SD: 70 fL High (03/12/17 06:20:00 EDT)    MPV: 8.6 fL Low (03/12/17 06:20:00 EDT)    NRBC Percent: 0 % (03/12/17 06:20:00 EDT)    Absolute NRBC Count: 0 thous/mm3 (03/12/17 06:20:00 EDT)    PT: 23.6 sec High (03/12/17 06:20:00 EDT)    INR: 2.4 (03/12/17 06:20:00 EDT)    PTT: 30.6 sec (03/12/17 06:20:00 EDT)    UA Color: Yellow1 (03/11/17 22:38:00 EDT)    UA Clarity: Clear1 (03/11/17 22:38:00 EDT)    UA Specific Gravity: 1.008 (03/11/17 22:38:00 EDT)    UA pH: 6 (03/11/17 22:38:00 EDT)    UA Protein: Negative1 (03/11/17 22:38:00 EDT)    UA Glucose: Negative1 (03/11/17 22:38:00 EDT)    UA Ketones: Negative1 (03/11/17 22:38:00 EDT)    UA Bilirubin: Negative1 (03/11/17 22:38:00 EDT)    UA Blood: Negative1 (03/11/17 22:38:00 EDT)    UA Urobilinogen: 4.0 Abnormal (03/11/17 22:38:00 EDT)    UA Nitrite: Negative1 (03/11/17 22:38:00 EDT)    UA Leukocyte Esterase: Negative1 (03/11/17 22:38:00 EDT)    UA RBC: 1 /HPF (03/11/17 22:38:00 EDT)    UA WBC: <1 (03/11/17 22:38:00 EDT)    UA Squamous Epithelial: <1 (03/11/17 22:38:00 EDT)    UA Bacteria: Trace1 Abnormal (03/11/17 22:38:00 EDT)    UA Hyaline Casts: 2 /  LPF (03/11/17 22:38:00 EDT)    Diagnostic Results    Impression and Plan    Knee abrasion            66 year old  unfortunate woman who lives at home alone and gets VNA services.  She reports poor appetite for several days and also reports falls at home.  Yesterday she fell and fire department were at her home to get her off the  floor, although she did not come to ER. She has reported low BP and VNA saw  her earlier today. She called 911 today and was brought here. She was found by  Paramedics to be bradycardic with HR40s and pauses. A 12 lead EKG done in ER  Showed SB with Antero-Lateral T wave abnormality and She was also hypotensive.  She was given 3L bolus NS and Labs later returned showing mild hyperkalemia  6.1 and in [facility] and the ER physician concluded that her cardiac abnormality and  hypotension was due to the potassium abnormality and she received usual  intervention for acute hyperkalemia. She was also given IV Lasix by ED  physician.    A cardiology consultation was requested by ER and she was seen by Dr Katrinka Blazing who  also concluded that her mildly elevated potassium may explain her symptoms and  EKG findings.    The patient was alert and oriented during her entire ER stay per ER staff. She  did inform me that she has not been having an appetite for days and has been  taking her medications including Atenolol 50mg  and Lisinopril 40 mg and  Amlodipine 5mg . She also take Lasix 40mg  in AM and 20 mg in the pm which is a  new addition from her PCP. She took all her AM medications today prior to  coming to ER except her Lasix. She has been feeling Dizzy for days and has had  several falls, but denies hitting head. She has multiple bruises on her skin.  She does have chronic anemia and gets blood transfusions and last blood  product given within the past 2 weeks. She denies melena or vomiting coffee  brown material. She denies Alcoholism. She is an active smoker and has COPD.  She denies DVT hx and not on anticoagulation per patient and external  medication list reviewed personally.    Hgb 7.1 is noted which is not unusual  for her close to her needing blood.  Denies fever or chills or urinary symptoms not associated with lasix therapy.  She is admitted for further management. Blood pressure has normalized after  fluid boluses initially given.                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 Renal consult requested    -monitor renal function, intake and output, avoid NSAIDs and nephrotoxins    -f/u blood cx; lactate normalized. will observe patient off abx as she denies any over symptoms such as headache, sinus congestion, cough, flulike sx, skin changes/ulcers, diarrhea, or dysuria     -lasix and antiHTN meds held. Resume half of home beta blocker dose if BP tolerates    -home psych meds were resumed. Consider remeron. Will consult psych for eval/optimize regimen. Avoid benzos given history of falls    -oncology evaluation for anemia and recent weight loss; patient has had abnormal CT and MR imaging in the past    -nutriton consult    -social work consult for frequent falls at home     -PT eval. Patient states that she has recently transitioned from using cane to using a walker    -supportive care, repositioning, skin care, fall risk protocol, pani control, incentive spirometry            DVT ppx: heparin; monitor plt counts    PPI                FULL CODE         Continue cardiac monitoring    SIGNATURE LINE Electronically signed by Allena Katz MD, Ikram Riebe on 03/12/2017 at  16:00:14 EST

## 2017-03-11 NOTE — Discharge Summary (Signed)
Date of Admission    03/11/2017    Date of Discharge    03/14/2017    Admission History    Please see hospital course    Code Status    Code Status - Ordered    -- 03/11/17 20:29:00 EDT, Do Not Resuscitate, No Intubation, Constant Order    Allergies    NKA    Social History    _Alcohol_    Current, 1-2 times per week    Past    _Substance Abuse_    Never    _Tobacco_    Current every day smoker, Cigarettes    Hospital Course        66 year female with history of        Chronic back pain    Peripheral neuropathy    Scoliosis    Bipolar disorder    Anxiety    Hypertension    COPD        presented to the emergency department with complaints of recurrent  falls,dizziness, generalized weakness and decreased appetite for the last few  days. Patient reported multiple falls    recently and called 911 after another episode of fall at home. Patient stated  that she was too weak and unable to get up from the floor. EMS noted the  patient to be bradycardic with    heart rate of 40 was also hypotensive.Patient on arrival to the emergency  department was noted to be clinically dehydrated and EKG performed revealed  sinus bradycardia/ junctional rhythm.    Routine laboratory data done was suggestive of acute kidney injury with  creatinine of 4.4 and hyperkalemia with potassium of 6.1. Patient was on  lisinopril, beta blockers and diuretics    which she was taking despite her low appetite and decreased oral intake.  Patient was also noted to be anemic with Hgb 7.1 on admission.            Acute Kidney injury/Hyperkalemia: Likely due to dehydration in the setting of  decreased oral appetite and being on diuretics, lisinopril and beta blockers.  Patient was started on aggressive    IV fluids with which her renal functions improved. Patient also received one-  time dose of Kayexalate for her hyperkalemia. Patient is symptomatically  better and hemodynamically stable    at the time of discharge. Patient's beta-blockers and  diuretics were restarted  once her renal functions improved.        Anemia: Patient is noted to have chronic anemic with hemoglobin of 7.1.  Patient stated that she never had a colonoscopy which can be pursued in the  outpatient setting. Patient had a EGD in June 2018. ( Please see report below  )        Chronic pain syndrome: continue regular analgesics.          Procedures and Treatment Provided    Physical Exam    Vitals & Measurements    **T:** 99.5 F (Oral) **TMIN:** 98.1 F (Oral) **TMAX:** 99.9 F (Oral)  **HR:** 82 **RR:** 18 **BP:** 136/82 **SpO2:** 90% **O2 Rate:** 3 **O2 Method  (L/min):** Nasal cannula **WT:** 83.6 Kg    General: Alert and oriented, no acute distress    Eye: PERRL, Normal Conjunctiva    HEENT: Normocephalic, no damage to dentition, moist oral mucosa, no pharyngeal  erythema, no sinus tenderness    Neck: supple, nontender, carotid pulse WNL, no carotid bruit, no JVD, no  lymphadenopathy, no thyromegaly, full ROM  Respiratory: Lungs clear to auscultation, respirations non-labored, breath  sounds equal and regular, symmetrical chest expansion, no chest wall  tenderness    Cardiovascular: Normal Rate, normal rhythm, _ beats/minute, no gallop, good  pulses equal in all extremities, normal peripheral perfusion, no edema.    Gastrointestinal: Abdomen soft, non tender, non-distended, normal bowel sounds  all four quadrants, no organomegaly,    Musculoskeletal: normal ROM, normal strength, no tenderness, no swelling, no  deformity, normal gait    Integumentary: Skin intact, warm, pink, dry. No pallor, no rash.    Neurologic: Alert, Oriented, normal sensory, normal motor, no focal deficits.      Lab Results    Glucose Lvl: 102 mg/dL (16/10/96 04:54:09 EDT)    BUN: 13 mg/dL (81/19/14 78:29:56 EDT)    Creatinine: 0.971 mg/dL (21/30/86 57:84:69 EDT)    Afn Amer Glomerular Filtration Rate: 71 ml/min/1.30m2 (03/14/17 08:34:00 EDT)    Brent Bulla Amer Glomerular Filtration Rate: 61 ml/min/1.41m2  (03/14/17 08:34:00  EDT)    Sodium Lvl: 136 mmol/L (03/14/17 08:34:00 EDT)    Potassium Lvl: 4.3 mmol/L (03/14/17 08:34:00 EDT)    Chloride: 106 mmol/L (03/14/17 08:34:00 EDT)    CO2: 24 mmol/L (03/14/17 08:34:00 EDT)    Anion Gap: 6 (03/14/17 08:34:00 EDT)    Total Protein: 6.3 Gm/dL (62/95/28 41:32:44 EDT)    Albumin Lvl: 2.7 Gm/dL Low (07/06/70 53:66:44 EDT)    Albumin Lvl: 2.7 Gm/dL Low (03/47/42 59:56:38 EDT)    Calcium Lvl: 7.9 mg/dL Low (75/64/33 29:51:88 EDT)    Phosphorus: 2.3 mg/dL Low (41/66/06 30:16:01 EDT)    Magnesium Lvl: 2.3 mg/dL (09/32/35 57:32:20 EDT)    Bilirubin Total: 1 mg/dL (25/42/70 62:37:62 EDT)    Bilirubin Direct: 0.3 mg/dL (83/15/17 61:60:73 EDT)    Alkaline Phosphatase: 105 Units/L (03/14/17 08:34:00 EDT)    AST: 18 Units/L (03/14/17 08:34:00 EDT)    ALT: 16 Units/L (03/14/17 08:34:00 EDT)    WBC: 11.3 thous/mm3 High (03/14/17 08:34:00 EDT)    RBC: 2.25 Mil/mm3 Low (03/14/17 08:34:00 EDT)    Hgb: 8.2 Gm/dL Low (71/06/26 94:85:46 EDT)    Hct: 24.8 % Low (03/14/17 08:34:00 EDT)    Platelet: 246 thous/mm3 (03/14/17 08:34:00 EDT)    MCV: 110.2 fL High (03/14/17 08:34:00 EDT)    MCH: 36.4 pGm High (03/14/17 08:34:00 EDT)    MCHC: 33.1 Gm/dL (27/03/50 09:38:18 EDT)    RDW-SD: 66.2 fL High (03/14/17 08:34:00 EDT)    MPV: 8.7 fL Low (03/14/17 08:34:00 EDT)    NRBC Percent: 0.3 % High (03/14/17 08:34:00 EDT)    Absolute NRBC Count: 0.03 thous/mm3 (03/14/17 08:34:00 EDT)    PT (AMS): 10.8 sec (03/14/17 08:34:00 EDT)    INR (AMS): 1 (03/14/17 08:34:00 EDT)            CT HEAD    IMPRESSION:    No CT evidence of intracranial hemorrhage, mass effect, or acute territorial    infarct.        No evidence of acute displaced calvarial or cervical spine fracture.        Nonspecific white matter hypodensities which may relate to chronic    microangiopathic disease.        Mild volume loss.        Clarisse Gouge MD 03/11/2017 3:26 PM        Signature Line    ***** Final Report*****    Signed by: Carrolyn Meiers MD, Jose  03/11/17 3:26 pm    [1]  XR CHEST            IMPRESSION:    No focal consolidation.        Marco Collie MD 03/11/2017 3:47 PM        Signature Line    ***** Final Report*****    Signed by: Edyth Gunnels MD, Lillia Abed 03/11/17 3:47 pm        [2]        CT ABDOMEN AND PELVIS            IMPRESSION:        1\. No evidence of bowel obstruction. Note is made of the presence of contrast    within the excluded portion of the stomach in this patient who is undergone a    previous Roux-en-Y gastric bypass. This finding is most commonly due to    retrograde reflux but may also be seen in patients who have a gastrogastric    fistula. Consider further evaluation with nonemergent fluoroscopic upper    gastrointestinal study after the patient's barium contrast has cleared.        2\. Cholecystectomy changes.        3\. Small bilateral pleural effusions and pulmonary vascular congestion which    raise the possibility of congestive heart failure exacerbation in this patient    with cardiomegaly.        Sheliah Plane MD 03/13/2017 4:44 PM        Signature Line    ***** Final Report*****    Signed by: Noe Gens MD, M Cristian 03/13/17 4:44 pm        [3]        EGD IN JUNE 2018            IMPRESSION AND PLAN:        Procedures under MAC.    EGD (not biopsied).        Significant ulcer in the distal esophagus at the Z line, with significant  exudate which also makes it difficult to ascertain the underlying etiology. No  biopsies taken, and I recommend twice a day PPI therapy along with liquid  Carafate, the PPI therapy twice daily ongoing until the time of next endoscopy  in approximately 8 weeks, liquid Carafate for the next 3 weeks, 3 times a day.        Is not clear to me at this moment when her most recent colonoscopy occurred,  and if it is been more than 24 months I would suggest EGD/colonoscopy at that  time.            Signature Line    Reviewed and electronically verified by: Dalphine Handing MD, Allayne Gitelman    on: 12/23/2016 15:21         [4]    Discharge Diagnoses    Primary Discharge Diagnosis        Acute Kidney injury    Hyperkalemia    Falls    Anemia        Secondary Discharge Diagnosis        Chronic back pain.    Peripheral neuropathy.    Scoliosis    Bipolar disorder.    Coronary artery disease, status post bypass.    Anxiety.    Hypertension.    COPD      Discharge Medications    _Discharge_    aluminum hydroxide/magnesium hydroxide/simethicone 200 mg-200 mg-20 mg/5 mL  oral suspension, 30 mL, PO, q4hr, PRN    amitriptyline, 50 mg, PO, HS  amLODIPine, 5 mg, PO, Once    atenolol, 50 mg, PO, BID    Breo Ellipta 100 mcg-25 mcg/inh inhalation powder, 1 puff(s), INHAL, Daily    clonazePAM 1 mg oral tablet, 1 mg, PO, BID, **Investigating**    Cymbalta 60 mg oral delayed release capsule, 60 mg= 1 cap(s), PO, Daily    ferrous gluconate 325 mg (36 mg elemental iron) oral tablet, 325 mg= 1 tab(s),  PO, BID    lactobacillus rhamnosus GG oral capsule, 1 cap(s), PO, Daily    Lasix, 40 mg, PO, Daily    Lasix 20 mg oral tablet, 20 mg= 1 tab(s), PO, Daily    Latuda, 120 mg, PO, HS    ondansetron 2 mg/mL injectable solution, 4 mg= 2 mL, IV Push, q8hr, PRN    oxyCODONE 10 mg oral tablet, 10 mg= 1 tab(s), PO, BID    pantoprazole 40 mg oral delayed release tablet, 40 mg= 1 tab(s), PO, BID    Spiriva 18 mcg inhalation capsule, 18 mcg= 1 cap(s), INHAL, Daily    sucralfate 1 g oral tablet, 1 Gm= 1 tab(s), PO, QID    tiZANidine 4 mg oral tablet, 4 mg= 1 tab(s), PO, QID, PRN    Vitamin B12 250 mcg oral tablet, 250 mcg= 1 tab(s), PO, Daily    Vitamin D3, 1000 Unit(s), PO, Daily    Discharge Instructions    Discharge Instructions:    Fall precaution instructions provided    Follow up with your Primary Care Physician in 1 week    Check CBC and Basic Metabolic Panel in 1 week          Face to Face    Face to Face time spent in discharge process is more than 30 minutes    [1] CT Head or Brain C-; Carrolyn Meiers MD, [facility] 03/11/2017 14:57 EDT    [2] XR Chest Single View; Edyth Gunnels  MD, Lillia Abed 03/11/2017 15:38 EDT    [3] CT Abdomen and Pelvis C-; Dobre MD, Judie Petit Cristian 03/13/2017 16:20 EDT    [4] MAC EGD Op Note [facility] *; Reichheld MD, Allayne Gitelman 12/23/2016 15:17 EDT    SIGNATURE LINE Electronically signed by Zane Herald MD-HOSP, Falecia Vannatter on 03/14/2017 at  16:48:31 EST

## 2017-03-11 NOTE — Progress Notes (Signed)
Interim History    No complaints today    In normal sinus rhythm      Physical Exam    Vitals & Measurements    **T:** 98.6 F (Oral) **HR:** 91 **RR:** 21 **BP:** 109/60 **SpO2:** 99%    **HT:** 157.48 cm **WT:** 89.5 Kg **BMI:** 36.09    General: alert    Eye: PERRL, Normal Conjunctiva    Neck: supple, nontender, carotid pulse WNL, no carotid bruit, no JVD,    Respiratory: Lungs clear to auscultation, respirations non-labored, breath  sounds equal and regular, symmetrical chest expansion, no chest wall  tenderness    Cardiovascular: Normal Rate, normal rhythm,, no gallop, normal peripheral  perfusion, no edema.    Gastrointestinal: Abdomen soft, non tender, non-distended,    Integumentary: Skin intact, warm    Neurologic: Alert, Oriented, no focal deficits.    Cognition and Speech: Oriented, speech clear and coherent    Psychiatric: Cooperative, appropriate mood & affect,    Impression and Plan    1\. Bradycardia    Improved with improvement in her renal function and resolution of  hyperkalemia. Junctional rhythm has been replaced with normal sinus rhythm.  Continue telemetry monitoring. likely metabolic in orgin in The setting of  acute renal failure, hyperkalemia, home atenolol use    2\. Hypotension    resolved    off home anti-hypertensives and cont to monitor BP      Acute renal failure    improving with IVF and holding antihypertensives        Medications    _Inpatient_    acetaminophen tablet, 650 mg= 2 tab(s), PO, q4hr, PRN    albuterol-ipratropium 2.5 mg-0.5 mg/3 mL inhalation solution, 3 mL, NEB, q4hr,  PRN    amitriptyline, 50 mg= 1 tab(s), PO, HS    atenolol, 50 mg= 1 tab(s), PO, Daily    budesonide 0.5 mg/2 mL inhalation suspension, 0.5 mg= 2 mL, NEB, BID    clonazePAM, 1 mg= 1 tab(s), PO, BID    Culturelle Digest[employer] oral capsule, 1 cap(s), PO, Daily    Cymbalta, 60 mg= 2 cap(s), PO, Daily    docusate sodium, 100 mg= 1 cap(s), PO, BID    ferrous sulfate, 325 mg= 1 tab(s), PO,  BID    formoterol 20 mcg/2 mL inhalation solution, 20 mcg= 2 mL, NEB, BID    heparin, 5000 Unit(s)= 1 mL, sc, q8hr    Latuda, 120 mg= 6 tab(s), PO, HS    ondansetron, 4 mg= 2 mL, IV Push, q8hr, PRN    oxyCODONE, 10 mg= 2 tab(s), PO, BID    pantoprazole, 40 mg= 1 tab(s), PO, BID    tiZANidine, 4 mg= 1 tab(s), PO, QID, PRN    Vitamin B12, 250 mcg= 2.5 tab(s), PO, Daily    Vitamin D3, 1000 Unit(s)= 1 tab(s), PO, Daily    _Home_    aluminum hydroxide/magnesium hydroxide/simethicone 200 mg-200 mg-20 mg/5 mL  oral suspension, 30 mL, PO, q4hr, PRN    amitriptyline, 50 mg, PO, HS    amLODIPine, 5 mg, PO, Once    atenolol, 50 mg, PO, BID    Breo Ellipta 100 mcg-25 mcg/inh inhalation powder, 1 puff(s), INHAL, Daily    clonazePAM 1 mg oral tablet, 1 mg, PO, BID, **Investigating**    Cymbalta 60 mg oral delayed release capsule, 60 mg= 1 cap(s), PO, Daily    ferrous gluconate 325 mg (36 mg elemental iron) oral tablet, 325 mg= 1 tab(s),  PO, BID  Lasix, 40 mg, PO, Daily    Lasix 20 mg oral tablet, 20 mg= 1 tab(s), PO, Daily    Latuda, 120 mg, PO, HS    ondansetron 2 mg/mL injectable solution, 4 mg= 2 mL, IV Push, q8hr, PRN    oxyCODONE 10 mg oral tablet, 10 mg= 1 tab(s), PO, BID    pantoprazole 40 mg oral delayed release tablet, 40 mg= 1 tab(s), PO, BID    Spiriva 18 mcg inhalation capsule, 18 mcg= 1 cap(s), INHAL, Daily    sucralfate 1 g oral tablet, 1 Gm= 1 tab(s), PO, QID    tiZANidine 4 mg oral tablet, 4 mg= 1 tab(s), PO, QID, PRN    Vitamin B12 250 mcg oral tablet, 250 mcg= 1 tab(s), PO, Daily    Vitamin D3, 1000 Unit(s), PO, Daily    Lab Results    Glucose Lvl: 154 mg/dL High (16/10/96 04:54:09 EDT)    BUN: 31 mg/dL High (81/19/14 78:29:56 EDT)    Creatinine: 2.2 mg/dL High (21/30/86 57:84:69 EDT)    Afn Amer Glomerular Filtration Rate: 26 ml/min/1.79m2 (03/12/17 06:20:00 EDT)    Brent Bulla Amer Glomerular Filtration Rate: 23 ml/min/1.80m2 (03/12/17 06:20:00  EDT)    Sodium Lvl: 138 mmol/L (03/12/17 06:20:00 EDT)     Potassium Lvl: 4.8 mmol/L (03/12/17 06:20:00 EDT)    Chloride: 106 mmol/L (03/12/17 06:20:00 EDT)    CO2: 24 mmol/L (03/12/17 06:20:00 EDT)    Anion Gap: 8 (03/12/17 06:20:00 EDT)    Total Protein: 4.7 Gm/dL Low (62/95/28 41:32:44 EDT)    Albumin Lvl: 2.2 Gm/dL Low (07/06/70 53:66:44 EDT)    Calcium Lvl: 6.5 mg/dL Critical (03/47/42 59:56:38 EDT)    Phosphorus: 5.1 mg/dL High (75/64/33 29:51:88 EDT)    Magnesium Lvl: 2.1 mg/dL (41/66/06 30:16:01 EDT)    Bilirubin Total: 0.5 mg/dL (09/32/35 57:32:20 EDT)    Bilirubin Direct: 0.3 mg/dL (25/42/70 62:37:62 EDT)    Alkaline Phosphatase: 79 Units/L (03/12/17 06:20:00 EDT)    LDH: 154 Units/L (03/12/17 06:20:00 EDT)    AST: 15 Units/L (03/12/17 06:20:00 EDT)    ALT: 14 Units/L (03/12/17 06:20:00 EDT)    Lactic Acid Lvl: 1.4 mmol/L (03/12/17 06:20:00 EDT)    Cortisol Lvl: 59 mcg/dL (83/15/17 61:60:73 EDT)    WBC: 7.2 thous/mm3 (03/12/17 06:20:00 EDT)    RBC: 1.92 Mil/mm3 Low (03/12/17 06:20:00 EDT)    Hgb: 7.1 Gm/dL Low (71/06/26 94:85:46 EDT)    Hct: 21.7 % Critical (03/12/17 06:20:00 EDT)    Platelet: 195 thous/mm3 (03/12/17 06:20:00 EDT)    MCV: 113 fL High (03/12/17 06:20:00 EDT)    MCH: 37 pGm High (03/12/17 06:20:00 EDT)    MCHC: 32.7 Gm/dL (27/03/50 09:38:18 EDT)    RDW-SD: 70 fL High (03/12/17 06:20:00 EDT)    MPV: 8.6 fL Low (03/12/17 06:20:00 EDT)    NRBC Percent: 0 % (03/12/17 06:20:00 EDT)    Absolute NRBC Count: 0 thous/mm3 (03/12/17 06:20:00 EDT)    PT: 23.6 sec High (03/12/17 06:20:00 EDT)    INR: 2.4 (03/12/17 06:20:00 EDT)    PTT: 30.6 sec (03/12/17 06:20:00 EDT)    UA Color: Yellow1 (03/11/17 22:38:00 EDT)    UA Clarity: Clear1 (03/11/17 22:38:00 EDT)    UA Specific Gravity: 1.008 (03/11/17 22:38:00 EDT)    UA pH: 6 (03/11/17 22:38:00 EDT)    UA Protein: Negative1 (03/11/17 22:38:00 EDT)    UA Glucose: Negative1 (03/11/17 22:38:00 EDT)    UA Ketones: Negative1 (03/11/17 22:38:00 EDT)    UA Bilirubin: Negative1 (03/11/17 22:38:00 EDT)  UA Blood:  Negative1 (03/11/17 22:38:00 EDT)    UA Urobilinogen: 4.0 Abnormal (03/11/17 22:38:00 EDT)    UA Nitrite: Negative1 (03/11/17 22:38:00 EDT)    UA Leukocyte Esterase: Negative1 (03/11/17 22:38:00 EDT)    UA RBC: 1 /HPF (03/11/17 22:38:00 EDT)    UA WBC: <1 (03/11/17 22:38:00 EDT)    UA Squamous Epithelial: <1 (03/11/17 22:38:00 EDT)    UA Bacteria: Trace1 Abnormal (03/11/17 22:38:00 EDT)    UA Hyaline Casts: 2 /LPF (03/11/17 22:38:00 EDT)    Diagnostic Results        ------        Lenox Ponds LINE Electronically signed by Marquis Lunch MD, Gypsy Lore LINE Electronically signed by Marquis Lunch MD, Giovonni Poirier on 03/12/2017 at  15:58:01 EST

## 2017-03-11 NOTE — H&P (Signed)
Chief Complaint    Falls, Dizziness, hypotension    History of Present Illness    66 year old unfortunate woman who lives at home alone and gets VNA services.  She reports poor appetite for several days and also reports falls at home.  Yesterday she fell and fire department were at her home to get her off the  floor, although she did not come to ER. She has reported low BP and VNA saw  her earlier today. She called 911 today and was brought here. She was found by  Paramedics to be bradycardic with HR40s and pauses. A 12 lead EKG done in ER  Showed SB with Antero-Lateral T wave abnormality and She was also hypotensive.  She was given 3L bolus NS and Labs later returned showing mild hyperkalemia  6.1 and in [facility] and the ER physician concluded that her cardiac abnormality and  hypotension was due to the potassium abnormality and she received usual  intervention for acute hyperkalemia. She was also given IV Lasix by ED  physician.    A cardiology consultation was requested by ER and she was seen by Dr Katrinka Blazing who  also concluded that her mildly elevated potassium may explain her symptoms and  EKG findings.    The patient was alert and oriented during her entire ER stay per ER staff. She  did inform me that she has not been having an appetite for days and has been  taking her medications including Atenolol 50mg  and Lisinopril 40 mg and  Amlodipine 5mg . She also take Lasix 40mg  in AM and 20 mg in the pm which is a  new addition from her PCP. She took all her AM medications today prior to  coming to ER except her Lasix. She has been feeling Dizzy for days and has had  several falls, but denies hitting head. She has multiple bruises on her skin.  She does have chronic anemia and gets blood transfusions and last blood  product given within the past 2 weeks. She denies melena or vomiting coffee  brown material. She denies Alcoholism. She is an active smoker and has COPD.  She denies DVT hx and not on anticoagulation per  patient and external  medication list reviewed personally.    Hgb 7.1 is noted which is not unusual for her close to her needing blood.  Denies fever or chills or urinary symptoms not associated with lasix therapy.  She is admitted for further management. Blood pressure has normalized after  fluid boluses initially given.    Review of Systems    12 point review of system negative, except as in history of present illness  and past medical history.    Code Status    Code Status - Ordered    -- 03/11/17 20:29:00 EDT, Do Not Resuscitate, No Intubation, Constant Order    Physical Exam    Vitals & Measurements    **HR:** 80 **RR:** 20 **BP:** 115/50 **SpO2:** 100% **WT:** 77.11 Kg    General: Alert and oriented, no acute distress, multiple skin bruises    Eye: PERRL, Normal Conjunctiva    HEENT: Normocephalic,Atraumatic, PERRLA, CN 2 to 12 Normal, ear canals patent,  no sinus tenderness    Neck: supple, nontender, carotid pulse WNL, no carotid bruit, no JVD, no  lymphadenopathy, no thyromegaly, full ROM    Respiratory:Lungs clear to auscultation, non-labored, BS equal and regular,  symmetrical chest expansion, no chest wall tenderness    Cardiovascular:Abnormal Rate, normal rhythm,  beats/minute,  no gallop, good  pulses equal in all extremities, normal peripheral perfusion, 3+ RLE and 2+LLE  edema.    Gastrointestinal: Abdomen soft, non tender, non-distended, normal bowel sounds  all four quadrants, no organomegaly    Genitourinary: No CVA tenderness    Musculoskeletal: normal ROM, normal strength, no tenderness, no swelling, no  deformity    Integumentary: Skin intact, warm, pink, dry. No pallor, no rash.    Neurologic: Alert, Oriented,al balance    Psychiatric: Cooperative, appropriate mood & affect, normal judgment    Impression and Plan    Acute renal failure    1\. Most likely pre-renal due to hypotension and dehydration. A foley has been  placed. Since she initially received 3L NS which was helpful for  her  hypotension I feel this patient needs more fluid. I have ordered additional  fluid boluses since I also learned she was given lasix in the ED. I will  expect therefore we shall experience the effect of the lasix given with  expected hypotension. We shall hold all blood pressure lowering medications  she takes. repeat BMP in AM.    Ordered:    Bradycardia    2\. She took Atenolol 50 mg today. This will be held. I doubt her K 6.1 is the  cause of her EKG changes. A level <6.4 is not expected to have EKG life  threatening changes.    FALL    3\. She has been falling due to low Blood pressures due to medications she  takes. may need rehab at DC. Home safety evaluation will be prudent.    Hyperkalemia    4\. Mild K 6.1 only. With interventions given this should resolve. Stop  Lisinopril. Repeat labs.    Ordered:    Hypotension    5\. I will consider sepsis in the DDx and will send blood Cx. Will give  emperic ABx as well and More IVF. She takes medications that have side effects  of hypotension. Lactic acid is elevated. Cefepime/ Vanco. She has multiple  skin wounds that can be portal of entry and causing sepsis from staph or  strep. CRP is elevated. No Cough.    Ordered:    Knee abrasion    6\. She has had falls and may develop infection. She has easy bruising one  would typically see on patients taking anticoagulation but informs me she does  not. Will check INR in AM since not done in ER.    7\. COPD hx: Resume all meds as PTA.    8\. Chronic pain syndrome: Meds as PTA.    9\. Anemia: Hgb 7.1 today. I will give blood only for <7.0.    plan: Follow urinary output and give fluids to keep >50cc per her as she is  septic. No Lasix for 24h at least while treat her hypotension and r/o sepsis.  Re-eval her ACE-I use before she is DC.    Ordered:    CMS Two-Midnight Rule    I certify that hospital inpatient services are reasonable and medically  necessary. They are appropriately provided as inpatient services in  accordance  with the two midnight benchmark under 42CFR 412 3(e), or the services are  specified as inpatient only procedure under 42 CFR 419 22(n).          Problem List/Past Medical History    _Ongoing_    Tobacco use    Procedure/Surgical History    EGD with Anesthesia (N/A) (12/23/2016), Inspection of Upper Intestinal Tract,  Via Natural or Artificial Opening Endoscopic (12/23/2016), Transfusion of  Nonautologous Red Blood Cells into Peripheral Vein, Percutaneous Approach  (12/20/2016), Drainage of Common Bile Duct with Drainage Device, Percutaneous  Approach (08/06/2016), Removal of Drainage Device from Hepatobiliary Duct,  External Approach (08/06/2016), Excision of Ampulla of Vater, Percutaneous  Approach, Diagnostic (08/05/2016), Drainage of Common Bile Duct with Drainage  Device, Percutaneous Approach (07/29/2016), Fluoroscopy of Biliary and  Pancreatic Ducts using Other Contrast (07/29/2016), TRAN NAUTO RED BLD CLL  PERIPH VN PC (10/04/2014), Gastric bypass surgery (2012).    Social History    _Alcohol_    Current, 1-2 times per week    Past    _Substance Abuse_    Never    _Tobacco_    Current every day smoker, Cigarettes    Family History    High blood pressure: Father, Sister and Brother.    Allergies    NKA    Medications    _Inpatient_    acetaminophen tablet, 650 mg= 2 tab(s), PO, q4hr, PRN    amitriptyline, 50 mg= 1 tab(s), PO, HS    bolus NS 1,000 mL, 1000 mL, IV    bolus NS 1,000 mL, 1000 mL, IV    bolus NS 1,000 mL, 1000 mL, IV    budesonide 0.5 mg/2 mL inhalation suspension, 0.5 mg= 2 mL, NEB, BID    cefepime, 1 Gm= 1 EA, IV Push, q12hr    clonazePAM, 1 mg= 1 tab(s), PO, BID    Cymbalta, 60 mg= 2 cap(s), PO, Daily    docusate sodium, 100 mg= 1 cap(s), PO, BID    ferrous sulfate, 325 mg= 1 tab(s), PO, BID    formoterol 20 mcg/2 mL inhalation solution, 20 mcg= 2 mL, NEB, BID    heparin, 5000 Unit(s)= 1 mL, sc, q8hr    Latuda, 120 mg= 6 tab(s), PO, HS    NS 1,000 mL, 1000 mL, IV    ondansetron, 4  mg= 2 mL, IV Push, q8hr, PRN    oxyCODONE, 10 mg= 2 tab(s), PO, BID    pantoprazole, 40 mg= 1 tab(s), PO, BID    Sodium Chloride 0.9% 250 mL, 250 mL, IV    Spiriva, 18 mcg= 1 cap(s), INHAL, Daily    sucralfate, 1 Gm= 1 tab(s), PO, QID    tiZANidine, 4 mg= 1 tab(s), PO, QID, PRN    Vitamin B12, 250 mcg= 2.5 tab(s), PO, Daily    Vitamin D3, 1000 Unit(s)= 1 tab(s), PO, Daily    _Home_    aluminum hydroxide/magnesium hydroxide/simethicone 200 mg-200 mg-20 mg/5 mL  oral suspension, 30 mL, PO, q4hr, PRN    amitriptyline, 50 mg, PO, HS    amLODIPine, 5 mg, PO, Once    atenolol, 50 mg, PO, BID    Breo Ellipta 100 mcg-25 mcg/inh inhalation powder, 1 puff(s), INHAL, Daily    clonazePAM 1 mg oral tablet, 1 mg, PO, BID, **Investigating**    Cymbalta 60 mg oral delayed release capsule, 60 mg= 1 cap(s), PO, Daily    ferrous gluconate 325 mg (36 mg elemental iron) oral tablet, 325 mg= 1 tab(s),  PO, BID    Lasix, 40 mg, PO, Daily    Lasix 20 mg oral tablet, 20 mg= 1 tab(s), PO, Daily    Latuda, 120 mg, PO, HS    ondansetron 2 mg/mL injectable solution, 4 mg= 2 mL, IV Push, q8hr, PRN    oxyCODONE 10 mg oral tablet, 10 mg= 1 tab(s), PO, BID    pantoprazole 40  mg oral delayed release tablet, 40 mg= 1 tab(s), PO, BID    Spiriva 18 mcg inhalation capsule, 18 mcg= 1 cap(s), INHAL, Daily    sucralfate 1 g oral tablet, 1 Gm= 1 tab(s), PO, QID    tiZANidine 4 mg oral tablet, 4 mg= 1 tab(s), PO, QID, PRN    Vitamin B12 250 mcg oral tablet, 250 mcg= 1 tab(s), PO, Daily    Vitamin D3, 1000 Unit(s), PO, Daily    Diet    Cardiac Solid Diet ([facility] Solid Diet) - Ordered    -- 03/11/17 20:29:00 EDT, Room Service, Scheduled / PRN    Lab Results    Glucose Lvl: 132 mg/dL High (03/47/42 59:56:38 EDT)    BUN: 45 mg/dL High (75/64/33 29:51:88 EDT)    Creatinine: 4.44 mg/dL Critical (41/66/06 30:16:01 EDT)    Afn Amer Glomerular Filtration Rate: 11 ml/min/1.3m2 (03/11/17 14:45:00 EDT)    Brent Bulla Amer Glomerular Filtration Rate: 10 ml/min/1.58m2 (03/11/17  14:45:00  EDT)    Sodium Lvl: 129 mmol/L Low (03/11/17 14:45:00 EDT)    Potassium Lvl: 6.1 mmol/L Critical (03/11/17 14:45:00 EDT)    Chloride: 92 mmol/L Low (03/11/17 14:45:00 EDT)    CO2: 23 mmol/L (03/11/17 14:45:00 EDT)    Anion Gap: 14 High (03/11/17 14:45:00 EDT)    Total Protein: 6.5 Gm/dL (09/32/35 57:32:20 EDT)    Albumin Lvl: 3 Gm/dL Low (25/42/70 62:37:62 EDT)    Calcium Lvl: 7.4 mg/dL Low (83/15/17 61:60:73 EDT)    Bilirubin Total: 1.2 mg/dL (71/06/26 94:85:46 EDT)    Alkaline Phosphatase: 99 Units/L (03/11/17 14:45:00 EDT)    AST: 25 Units/L (03/11/17 14:45:00 EDT)    ALT: 20 Units/L (03/11/17 14:45:00 EDT)    Total CK: 135 Units/L (03/11/17 14:45:00 EDT)    Troponin I: 0.024 nGm/ml (03/11/17 14:45:00 EDT)    C-Reactive Protein: 1.1 mg/dL High (27/03/50 09:38:18 EDT)    WBC: 12.6 thous/mm3 High (03/11/17 14:45:00 EDT)    RBC: 1.98 Mil/mm3 Low (03/11/17 14:45:00 EDT)    Hgb: 7.1 Gm/dL Low (29/93/71 69:67:89 EDT)    Hct: 21.8 % Critical (03/11/17 14:45:00 EDT)    Platelet: 289 thous/mm3 (03/11/17 14:45:00 EDT)    MCV: 110.1 fL High (03/11/17 14:45:00 EDT)    MCH: 35.9 pGm High (03/11/17 14:45:00 EDT)    MCHC: 32.6 Gm/dL (38/10/17 51:02:58 EDT)    RDW-SD: 69.2 fL High (03/11/17 14:45:00 EDT)    MPV: 9.3 fL Low (03/11/17 14:45:00 EDT)    Absolute Neutro Count: 10.29 thous/mm3 High (03/11/17 14:45:00 EDT)    Absolute Lymphs Count: 1.18 thous/mm3 (03/11/17 14:45:00 EDT)    Absolute Mono Count: 1.05 thous/mm3 (03/11/17 14:45:00 EDT)    Absolute Eos Count: 0.04 thous/mm3 (03/11/17 14:45:00 EDT)    Absolute Baso Count: 0.02 thous/mm3 (03/11/17 14:45:00 EDT)    Neutrophils: 81.3 % (03/11/17 14:45:00 EDT)    Lymphocytes: 9.3 % (03/11/17 14:45:00 EDT)    Monocytes: 8.3 % (03/11/17 14:45:00 EDT)    Eosinophils: 0.3 % (03/11/17 14:45:00 EDT)    Basophils: 0.2 % (03/11/17 14:45:00 EDT)    Immature Granulocytes: 0.6 % (03/11/17 14:45:00 EDT)    NRBC Percent: 0 % (03/11/17 14:45:00 EDT)    Absolute NRBC Count: 0  thous/mm3 (03/11/17 14:45:00 EDT)    Isaias Sakai: A POS (03/11/17 14:45:00 EDT)    Antibody Screen: Negative (03/11/17 14:45:00 EDT)    Diagnostic Results        ------        Lenox Ponds LINE Electronically signed by Sandrea Matte MD-HOSP, Evaristo Bury on 03/12/2017  at 08:18:37 EST

## 2017-03-12 LAB — HX BASIC METABOLIC PANEL
CASE NUMBER: 2018251000348
HX ANION GAP: 8 — NL (ref 3.0–11.0)
HX BUN: 31 mg/dL — ABNORMAL HIGH (ref 7.0–18.0)
HX CALCIUM LVL: 6.5 mg/dL — CR (ref 8.5–10.1)
HX CHLORIDE: 106 mmol/L — NL (ref 98.0–110.0)
HX CO2: 24 mmol/L — NL (ref 21.0–32.0)
HX CREATININE: 2.2 mg/dL — ABNORMAL HIGH (ref 0.55–1.3)
HX GLUCOSE LVL: 154 mg/dL — ABNORMAL HIGH (ref 65.0–110.0)
HX POTASSIUM LVL: 4.8 mmol/L — NL (ref 3.6–5.2)
HX SODIUM LVL: 138 mmol/L — NL (ref 136.0–145.0)

## 2017-03-12 LAB — HX PHOSPHORUS LEVEL
CASE NUMBER: 2018251000348
HX PHOSPHORUS: 5.1 mg/dL — ABNORMAL HIGH (ref 2.4–4.9)

## 2017-03-12 LAB — HX CBC W/ INDICES
CASE NUMBER: 2018251000348
HX ABSOLUTE NRBC COUNT: 0 10*3/uL
HX HCT: 21.7 % — CR (ref 36.0–47.0)
HX HGB: 7.1 g/dL — ABNORMAL LOW (ref 11.8–16.0)
HX MCH: 37 pg — ABNORMAL HIGH (ref 26.0–34.0)
HX MCHC: 32.7 g/dL — NL (ref 31.0–37.0)
HX MCV: 113 fL — ABNORMAL HIGH (ref 80.0–100.0)
HX MPV: 8.6 fL — ABNORMAL LOW (ref 9.4–12.4)
HX NRBC PERCENT: 0 % — NL
HX PLATELET: 195 10*3/uL — NL (ref 150.0–400.0)
HX RBC: 1.92 10*6/uL — ABNORMAL LOW (ref 3.9–5.2)
HX RDW-CV: 16.8 % — ABNORMAL HIGH (ref 11.5–14.5)
HX RDW-SD: 70 fL — ABNORMAL HIGH (ref 35.0–51.0)
HX WBC: 7.2 10*3/uL — NL (ref 3.7–11.2)

## 2017-03-12 LAB — HX HEPATIC FUNCTION PANEL
CASE NUMBER: 2018251000348
HX ALBUMIN LVL: 2.2 g/dL — ABNORMAL LOW (ref 3.5–5.0)
HX ALKALINE PHOSPHATASE: 79 U/L — NL (ref 45.0–117.0)
HX ALT: 14 U/L — NL (ref 6.0–78.0)
HX AST: 15 U/L — NL (ref 6.0–40.0)
HX BILIRUBIN DIRECT: 0.3 mg/dL — NL (ref 0.0–0.3)
HX BILIRUBIN TOTAL: 0.5 mg/dL — NL (ref 0.2–1.2)
HX TOTAL PROTEIN: 4.7 g/dL — ABNORMAL LOW (ref 6.0–8.0)

## 2017-03-12 LAB — HX PT
CASE NUMBER: 2018251000348
HX INR: 2.4
HX PT: 23.6 s — ABNORMAL HIGH (ref 9.3–11.6)

## 2017-03-12 LAB — HX GLOMERULAR FILTRATION RATE (ESTIMATED)
CASE NUMBER: 2018251000348
HX AFN AMER GLOMERULAR FILTRATION RATE: 26 mL/min/{1.73_m2}
HX NON-AFN AMER GLOMERULAR FILTRATION RATE: 23 mL/min/{1.73_m2}

## 2017-03-12 LAB — HX CORTISOL LEVEL
CASE NUMBER: 2018251000348
HX CORTISOL LVL: 59 ug/dL

## 2017-03-12 LAB — HX MAGNESIUM LEVEL
CASE NUMBER: 2018251000348
HX MAGNESIUM LVL: 2.1 mg/dL — NL (ref 1.8–2.4)

## 2017-03-12 LAB — HX LACTATE DEHYDROGENASE
CASE NUMBER: 2018251000348
HX LDH: 154 U/L — NL (ref 84.0–246.0)

## 2017-03-12 LAB — HX LACTIC ACID
CASE NUMBER: 2018251000214
CASE NUMBER: 2018251000348
HX LACTIC ACID LVL: 2.2 mmol/L — ABNORMAL HIGH (ref 0.4–2.0)
HX LACTIC ACID LVL: 1.4 mmol/L — NL (ref 0.4–2.0)

## 2017-03-12 LAB — HX PTT
CASE NUMBER: 2018251000348
HX PTT: 30.6 s — NL (ref 23.4–32.1)

## 2017-03-13 LAB — HX FOLATE LEVEL
CASE NUMBER: 2018252000348
HX FOLATE LVL: >20 — NL (ref 5.9–20.0)

## 2017-03-13 LAB — HX CBC W/ INDICES
CASE NUMBER: 2018252000347
HX ABSOLUTE NRBC COUNT: 0 10*3/uL
HX HCT: 22.2 % — ABNORMAL LOW (ref 36.0–47.0)
HX HGB: 7.1 g/dL — ABNORMAL LOW (ref 11.8–16.0)
HX MCH: 35.9 pg — ABNORMAL HIGH (ref 26.0–34.0)
HX MCHC: 32 g/dL — NL (ref 31.0–37.0)
HX MCV: 112.1 fL — ABNORMAL HIGH (ref 80.0–100.0)
HX MPV: 8.8 fL — ABNORMAL LOW (ref 9.4–12.4)
HX NRBC PERCENT: 0 % — NL
HX PLATELET: 207 10*3/uL — NL (ref 150.0–400.0)
HX RBC: 1.98 10*6/uL — ABNORMAL LOW (ref 3.9–5.2)
HX RDW-CV: 17 % — ABNORMAL HIGH (ref 11.5–14.5)
HX RDW-SD: 67.8 fL — ABNORMAL HIGH (ref 35.0–51.0)
HX WBC: 5.7 10*3/uL — NL (ref 3.7–11.2)

## 2017-03-13 LAB — HX BASIC METABOLIC PANEL
CASE NUMBER: 2018252000122
HX ANION GAP: 7 — NL (ref 3.0–11.0)
HX BUN: 20 mg/dL — ABNORMAL HIGH (ref 7.0–18.0)
HX CALCIUM LVL: 7.1 mg/dL — ABNORMAL LOW (ref 8.5–10.1)
HX CHLORIDE: 109 mmol/L — NL (ref 98.0–110.0)
HX CO2: 25 mmol/L — NL (ref 21.0–32.0)
HX CREATININE: 1.34 mg/dL — ABNORMAL HIGH (ref 0.55–1.3)
HX GLUCOSE LVL: 104 mg/dL — NL (ref 65.0–110.0)
HX POTASSIUM LVL: 4.1 mmol/L — NL (ref 3.6–5.2)
HX SODIUM LVL: 141 mmol/L — NL (ref 136.0–145.0)

## 2017-03-13 LAB — HX ANTICOAG PT/INR
CASE NUMBER: 2018252000348
HX INR (AMS): 1.1
HX PT (AMS): 11.2 s — NL (ref 9.7–11.2)

## 2017-03-13 LAB — HX GLOMERULAR FILTRATION RATE (ESTIMATED)
CASE NUMBER: 2018252000122
HX AFN AMER GLOMERULAR FILTRATION RATE: 48 mL/min/{1.73_m2}
HX NON-AFN AMER GLOMERULAR FILTRATION RATE: 42 mL/min/{1.73_m2}

## 2017-03-13 LAB — HX VITAMIN B12 LEVEL
CASE NUMBER: 2018252000348
HX VITAMIN B12 LVL: 1374 pg/mL — ABNORMAL HIGH (ref 193.0–986.0)

## 2017-03-13 LAB — HX IRON PROFILE
CASE NUMBER: 2018252000348
HX % IRON BOUND: 36 % — NL (ref 10.0–50.0)
HX FERRITIN LVL: 1390 ng/mL — ABNORMAL HIGH (ref 8.0–252.0)
HX IRON: 53 ug/dL — NL (ref 50.0–170.0)
HX TIBC: 146 ug/dL — ABNORMAL LOW (ref 250.0–450.0)

## 2017-03-13 LAB — HX PHOSPHORUS LEVEL
CASE NUMBER: 2018252000122
HX PHOSPHORUS: 3 mg/dL — NL (ref 2.4–4.9)

## 2017-03-13 LAB — HX MAGNESIUM LEVEL
CASE NUMBER: 2018252000122
HX MAGNESIUM LVL: 2.3 mg/dL — NL (ref 1.8–2.4)

## 2017-03-13 LAB — HX HAPTOGLOBIN
CASE NUMBER: 2018252000347
HX HAPTOGLOBIN: 81 mg/dL — NL

## 2017-03-13 LAB — HX RETICULOCYTE COUNT AUTOMATED
CASE NUMBER: 2018252000347
HX ABSOLUTE RETIC COUNT: 0.1731 10*6/uL — ABNORMAL HIGH (ref 0.02–0.08)
HX RETIC COUNT PERCENT: 8.7 %

## 2017-03-14 ENCOUNTER — Ambulatory Visit: Admitting: Emergency Medicine

## 2017-03-14 LAB — HX HOLD SMEAR: CASE NUMBER: 16913500

## 2017-03-14 LAB — HX BASIC METABOLIC PANEL
CASE NUMBER: 2018253000146
HX ANION GAP: 6 — NL (ref 3.0–11.0)
HX BUN: 13 mg/dL — NL (ref 7.0–18.0)
HX CALCIUM LVL: 7.9 mg/dL — ABNORMAL LOW (ref 8.5–10.1)
HX CHLORIDE: 106 mmol/L — NL (ref 98.0–110.0)
HX CO2: 24 mmol/L — NL (ref 21.0–32.0)
HX CREATININE: 0.971 mg/dL — NL (ref 0.55–1.3)
HX GLUCOSE LVL: 102 mg/dL — NL (ref 65.0–110.0)
HX POTASSIUM LVL: 4.3 mmol/L — NL (ref 3.6–5.2)
HX SODIUM LVL: 136 mmol/L — NL (ref 136.0–145.0)

## 2017-03-14 LAB — HX CBC W/ INDICES
CASE NUMBER: 2018253000146
HX ABSOLUTE NRBC COUNT: 0.03 10*3/uL
HX HCT: 24.8 % — ABNORMAL LOW (ref 36.0–47.0)
HX HGB: 8.2 g/dL — ABNORMAL LOW (ref 11.8–16.0)
HX MCH: 36.4 pg — ABNORMAL HIGH (ref 26.0–34.0)
HX MCHC: 33.1 g/dL — NL (ref 31.0–37.0)
HX MCV: 110.2 fL — ABNORMAL HIGH (ref 80.0–100.0)
HX MPV: 8.7 fL — ABNORMAL LOW (ref 9.4–12.4)
HX NRBC PERCENT: 0.3 % — ABNORMAL HIGH
HX PLATELET: 246 10*3/uL — NL (ref 150.0–400.0)
HX RBC: 2.25 10*6/uL — ABNORMAL LOW (ref 3.9–5.2)
HX RDW-CV: 16.9 % — ABNORMAL HIGH (ref 11.5–14.5)
HX RDW-SD: 66.2 fL — ABNORMAL HIGH (ref 35.0–51.0)
HX WBC: 11.3 10*3/uL — ABNORMAL HIGH (ref 3.7–11.2)

## 2017-03-14 LAB — HX COMPREHENSIVE METABOLIC PANEL
CASE NUMBER: 2018253002971
HX ALBUMIN LVL: 2.9 g/dL — ABNORMAL LOW (ref 3.5–5.0)
HX ALKALINE PHOSPHATASE: 108 U/L — NL (ref 45.0–117.0)
HX ALT: 15 U/L — NL (ref 6.0–78.0)
HX ANION GAP: 8 — NL (ref 3.0–11.0)
HX AST: 19 U/L — NL (ref 6.0–40.0)
HX BILIRUBIN TOTAL: 1.6 mg/dL — ABNORMAL HIGH (ref 0.2–1.2)
HX BUN: 11 mg/dL — NL (ref 7.0–18.0)
HX CALCIUM LVL: 8.2 mg/dL — ABNORMAL LOW (ref 8.5–10.1)
HX CHLORIDE: 102 mmol/L — NL (ref 98.0–110.0)
HX CO2: 26 mmol/L — NL (ref 21.0–32.0)
HX CREATININE: 1.01 mg/dL — NL (ref 0.55–1.3)
HX GLUCOSE LVL: 126 mg/dL — ABNORMAL HIGH (ref 65.0–110.0)
HX POTASSIUM LVL: 3.8 mmol/L — NL (ref 3.6–5.2)
HX SODIUM LVL: 136 mmol/L — NL (ref 136.0–145.0)
HX TOTAL PROTEIN: 7 g/dL — NL (ref 6.0–8.0)

## 2017-03-14 LAB — HX ANTICOAG PT/INR
CASE NUMBER: 2018253000343
HX INR (AMS): 1
HX PT (AMS): 10.8 s — NL (ref 9.7–11.2)

## 2017-03-14 LAB — HX HEPATIC FUNCTION PANEL
CASE NUMBER: 16913500
HX ALBUMIN LVL: 2.7 g/dL — ABNORMAL LOW (ref 3.5–5.0)
HX ALKALINE PHOSPHATASE: 105 U/L — NL (ref 45.0–117.0)
HX ALT: 16 U/L — NL (ref 6.0–78.0)
HX AST: 18 U/L — NL (ref 6.0–40.0)
HX BILIRUBIN DIRECT: 0.3 mg/dL — NL (ref 0.0–0.3)
HX BILIRUBIN TOTAL: 1 mg/dL — NL (ref 0.2–1.2)
HX TOTAL PROTEIN: 6.3 g/dL — NL (ref 6.0–8.0)

## 2017-03-14 LAB — HX .AUTOMATED DIFF
CASE NUMBER: 2018253002971
HX ABSOLUTE BASO COUNT: 0.02 10*3/uL — NL (ref 0.0–0.22)
HX ABSOLUTE EOS COUNT: 0.01 10*3/uL — NL (ref 0.0–0.45)
HX ABSOLUTE LYMPHS COUNT: 0.82 10*3/uL — NL (ref 0.74–5.04)
HX ABSOLUTE MONO COUNT: 1.58 10*3/uL — ABNORMAL HIGH (ref 0.0–1.34)
HX ABSOLUTE NEUTRO COUNT: 12.49 10*3/uL — ABNORMAL HIGH (ref 1.48–7.95)
HX BASOPHILS: 0.1 %
HX EOSINOPHILS: 0.1 %
HX IMMATURE GRANULOCYTES: 0.7 % — NL (ref 0.0–2.0)
HX LYMPHOCYTES: 5.5 %
HX MONOCYTES: 10.5 %
HX NEUTROPHILS: 83.1 %

## 2017-03-14 LAB — HX PHOSPHORUS LEVEL
CASE NUMBER: 2018253000146
HX PHOSPHORUS: 2.3 mg/dL — ABNORMAL LOW (ref 2.4–4.9)

## 2017-03-14 LAB — HX URINE DIPSTICK W/REFLEX
CASE NUMBER: 2018253003016
HX UA BILIRUBIN: NEGATIVE — NL
HX UA BLOOD: NEGATIVE — NL
HX UA GLUCOSE: NEGATIVE — NL
HX UA LEUKOCYTE ESTERASE: NEGATIVE — NL
HX UA NITRITE: NEGATIVE — NL
HX UA PH: 6 — NL (ref 5.0–8.0)
HX UA PROTEIN: NEGATIVE — NL
HX UA RBC: 1 /HPF — NL (ref 0.0–2.0)
HX UA SPECIFIC GRAVITY: 1.009 — NL (ref 1.003–1.03)
HX UA SQUAMOUS EPITHELIAL: <1 — NL (ref 0.0–5.0)
HX UA UROBILINOGEN: NEGATIVE — NL
HX UA WBC: <1 — NL (ref 0.0–5.0)

## 2017-03-14 LAB — HX MAGNESIUM LEVEL
CASE NUMBER: 2018253000146
HX MAGNESIUM LVL: 2.3 mg/dL — NL (ref 1.8–2.4)

## 2017-03-14 LAB — HX CBC W/ DIFF
CASE NUMBER: 2018253002971
HX ABSOLUTE NRBC COUNT: 0.04 10*3/uL
HX HCT: 25.8 % — ABNORMAL LOW (ref 36.0–47.0)
HX HGB: 8.5 g/dL — ABNORMAL LOW (ref 11.8–16.0)
HX MCH: 36 pg — ABNORMAL HIGH (ref 26.0–34.0)
HX MCHC: 32.9 g/dL — NL (ref 31.0–37.0)
HX MCV: 109.3 fL — ABNORMAL HIGH (ref 80.0–100.0)
HX MPV: 8.5 fL — ABNORMAL LOW (ref 9.4–12.4)
HX NRBC PERCENT: 0.3 % — ABNORMAL HIGH
HX PLATELET: 279 10*3/uL — NL (ref 150.0–400.0)
HX RBC: 2.36 10*6/uL — ABNORMAL LOW (ref 3.9–5.2)
HX RDW-CV: 16.8 % — ABNORMAL HIGH (ref 11.5–14.5)
HX RDW-SD: 64.7 fL — ABNORMAL HIGH (ref 35.0–51.0)
HX WBC: 15 10*3/uL — ABNORMAL HIGH (ref 3.7–11.2)

## 2017-03-14 LAB — HX LIPASE LEVEL
CASE NUMBER: 16918146
HX LIPASE LVL: 65 U/L — ABNORMAL LOW (ref 73.0–393.0)

## 2017-03-14 LAB — HX TROPONIN I
CASE NUMBER: 2018253002971
HX TROPONIN I: 0.294 ng/mL — ABNORMAL HIGH (ref 0.015–0.045)

## 2017-03-14 LAB — HX BLUE TOP TO HOLD: CASE NUMBER: 2018253002971

## 2017-03-14 LAB — HX NT-PROBNP
CASE NUMBER: 2018253002971
HX NT-PROBNP: 39866 pg/mL — ABNORMAL HIGH (ref 0.0–900.0)

## 2017-03-14 LAB — HX GLOMERULAR FILTRATION RATE (ESTIMATED)
CASE NUMBER: 2018253000146
HX AFN AMER GLOMERULAR FILTRATION RATE: 71 mL/min/{1.73_m2}
HX NON-AFN AMER GLOMERULAR FILTRATION RATE: 61 mL/min/{1.73_m2}

## 2017-03-14 LAB — HX ALBUMIN LEVEL
CASE NUMBER: 2018253000146
HX ALBUMIN LVL: 2.7 g/dL — ABNORMAL LOW (ref 3.5–5.0)

## 2017-03-14 LAB — HX SST GOLD TUBE TO HOLD: CASE NUMBER: 2018253002990

## 2017-03-14 LAB — HX LACTIC ACID
CASE NUMBER: 2018253002971
HX LACTIC ACID LVL: 1.6 mmol/L — NL (ref 0.4–2.0)

## 2017-03-15 ENCOUNTER — Inpatient Hospital Stay
Admit: 2017-03-15 | Disposition: A | Discharge status: Home Health | Source: Home / Self Care | Attending: Emergency Medicine | Admitting: Emergency Medicine

## 2017-03-15 LAB — HX BLOOD GAS ARTERIAL
CASE NUMBER: 2018254000264
HX BASE EXCESS ARTERIAL: 6.9 mmol/L — ABNORMAL HIGH (ref <=2.0)
HX FIO2 CONC: 45
HX HCO3 ARTERIAL: 30.3 mmol/L — ABNORMAL HIGH (ref 22.0–26.0)
HX O2 SAT ARTERIAL: 96.4 % — NL (ref 95.0–99.0)
HX PCO2 ARTERIAL: 37.3 mmHg — NL (ref 35.0–45.0)
HX PH ARTERIAL: 7.51 — ABNORMAL HIGH (ref 7.35–7.45)
HX PO2 ARTERIAL: 71.1 mmHg — ABNORMAL LOW (ref 80.0–100.0)

## 2017-03-15 LAB — HX LEGIONELLA ANTIGEN
CASE NUMBER: 2018254001303
HX INTERNAL NEG LEG: NEGATIVE — NL
HX INTERNAL POS LEG: POSITIVE — NL
HX U LEGIONELLA AG: NEGATIVE — NL

## 2017-03-15 LAB — HX BLOOD CULTURE
CASE NUMBER: 2018250002501
CASE NUMBER: 2018250002502
HX F: NO GROWTH
HX F: NO GROWTH

## 2017-03-15 LAB — HX STREP PNEUMO ANTIGEN
CASE NUMBER: 2018254001303
HX INTERNAL NEG SPN: NEGATIVE — NL
HX INTERNAL POS SPN: POSITIVE — NL
HX STREP PNEUMO ANTIGEN URINE: NEGATIVE — NL

## 2017-03-15 LAB — HX TROPONIN I
CASE NUMBER: 2018254000266
HX TROPONIN I: 0.227 ng/mL — ABNORMAL HIGH (ref 0.015–0.045)

## 2017-03-15 NOTE — Progress Notes (Signed)
Subjective    today feels better in terms of shortness of breath.    Review of Systems    Objective    Vitals & Measurements    **T:** 98.8 F (Oral) **TMIN:** 98.3 F (Oral) **TMAX:** 99 F (Oral) **HR:**  74 **RR:** 13 **BP:** 129/84 **SpO2:** 93% **O2 Rate:** 2 **O2 Method  (L/min):** Room air **WT:** 82.4 Kg    Physical Exam    General: Alert and oriented, no acute distress    Eye: PERRL, Normal Conjunctiva    HEENT: Normocephalic, no damage to dentition, moist oral mucosa, no pharyngeal  erythema, no sinus tenderness    Neck: supple, nontender, carotid pulse WNL, no carotid bruit, no JVD, no  lymphadenopathy, no thyromegaly, full ROM    Respiratory: Lungs crackles to auscultation, respirations non-labored, breath  sounds equal and regular, symmetrical chest expansion, no chest wall  tenderness    Cardiovascular: Normal Rate, normal rhythm, _ beats/minute, no gallop, good  pulses equal in all extremities, normal peripheral perfusion, no edema.    Gastrointestinal: Abdomen soft, non tender, non-distended, normal bowel sounds  all four quadrants, no organomegaly,    Integumentary: Skin intact, warm, pink, dry. No pallor, no rash.    Neurologic: Alert, Oriented, normal sensory, normal motor, no focal deficits.  [1]    Medications    _Inpatient_    acetaminophen tablet, 650 mg= 2 tab(s), PO, q4hr, PRN    Al hydroxide/Mg hydroxide/simethicone, 30 mL, PO, q4hr, PRN    albuterol 2.5 mg/3 mL (0.083%) inhalation solution, 2.5 mg= 3 mL, NEB, q4hr,  PRN    amitriptyline, 50 mg= 1 tab(s), PO, HS    aspirin 81 mg oral enteric coated tablet, 81 mg= 1 tab(s), PO, Daily    atenolol, 50 mg= 1 tab(s), PO, BID    bisacodyl, 10 mg= 1 supp, PR, Daily, PRN    budesonide 0.5 mg/2 mL inhalation suspension, 0.5 mg= 2 mL, NEB, BID    clonazePAM, 1 mg= 1 tab(s), PO, BID    Culturelle Digestive Health oral capsule, 1 cap(s), PO, Daily    Cymbalta, 60 mg= 2 cap(s), PO, Daily    docusate sodium, 100 mg= 1 cap(s), PO,  BID    enoxaparin, 40 mg= 0.4 mL, sc, q24hr    ferrous sulfate, 325 mg= 1 tab(s), PO, BID    formoterol 20 mcg/2 mL inhalation solution, 20 mcg= 2 mL, NEB, BID    Lasix, 40 mg= 4 mL, IV Push, TID    Latuda, 120 mg= 6 tab(s), PO, HS    Maalox, 30 mL, PO, q4hr, PRN    morPHINE, 2 mg= 1 mL, IV Push, q4hr, PRN    Mucinex, 600 mg= 1 tab(s), PO, q12hr    NaCl 0.9% bolus 500 mL, 500 mL, IV    ondansetron, 4 mg= 2 mL, IV Push, q8hr, PRN    oxyCODONE, 10 mg= 2 tab(s), PO, q6hr, PRN    pantoprazole, 40 mg= 1 tab(s), PO, BID    Robitussin, 100 mg= 5 mL, PO, q4hr, PRN    Sodium Chloride 0.9% 250 mL, 250 mL, IV    Sodium Chloride 0.9% 250 mL, 250 mL, IV    Zanaflex, 4 mg= 1 tab(s), PO, QID, PRN    _Home_    aluminum hydroxide/magnesium hydroxide/simethicone 200 mg-200 mg-20 mg/5 mL  oral suspension, 30 mL, PO, q4hr, PRN    amitriptyline, 50 mg, PO, HS    amLODIPine, 5 mg, PO, Once    atenolol, 50 mg, PO, BID  Breo Ellipta 100 mcg-25 mcg/inh inhalation powder, 1 puff(s), INHAL, Daily    clonazePAM 1 mg oral tablet, 1 mg, PO, BID    Cymbalta 60 mg oral delayed release capsule, 60 mg= 1 cap(s), PO, Daily    ferrous gluconate 325 mg (36 mg elemental iron) oral tablet, 325 mg= 1 tab(s),  PO, BID    lactobacillus rhamnosus GG oral capsule, 1 cap(s), PO, Daily    Lasix, 40 mg, PO, Daily    Lasix 20 mg oral tablet, 20 mg= 1 tab(s), PO, Daily    Latuda, 120 mg, PO, HS    ondansetron 2 mg/mL injectable solution, 4 mg= 2 mL, IV Push, q8hr, PRN    oxyCODONE 10 mg oral tablet, 10 mg= 1 tab(s), PO, BID    pantoprazole 40 mg oral delayed release tablet, 40 mg= 1 tab(s), PO, BID    Spiriva 18 mcg inhalation capsule, 18 mcg= 1 cap(s), INHAL, Daily    sucralfate 1 g oral tablet, 1 Gm= 1 tab(s), PO, QID    tiZANidine 4 mg oral tablet, 4 mg= 1 tab(s), PO, QID, PRN    Vitamin B12 250 mcg oral tablet, 250 mcg= 1 tab(s), PO, Daily    Vitamin D3, 1000 Unit(s), PO, Daily    Lab Results    Glucose Lvl: 85 mg/dL (14/78/29 56:21:30 EDT)    BUN: 15 mg/dL  (86/57/84 69:62:95 EDT)    Creatinine: 1.24 mg/dL (28/41/32 44:01:02 EDT)    Afn Amer Glomerular Filtration Rate: 53 ml/min/1.60m2 (03/17/17 06:36:00 EDT)    Brent Bulla Amer Glomerular Filtration Rate: 46 ml/min/1.58m2 (03/17/17 06:36:00  EDT)    Sodium Lvl: 141 mmol/L (03/17/17 06:36:00 EDT)    Potassium Lvl: 3.5 mmol/L Low (03/17/17 06:36:00 EDT)    Chloride: 101 mmol/L (03/17/17 06:36:00 EDT)    CO2: 32 mmol/L (03/17/17 06:36:00 EDT)    Anion Gap: 8 (03/17/17 06:36:00 EDT)    Calcium Lvl: 7.7 mg/dL Low (72/53/66 44:03:47 EDT)    Magnesium Lvl: 1.6 mg/dL Low (42/59/56 38:75:64 EDT)    WBC: 5.9 thous/mm3 (03/17/17 06:36:00 EDT)    RBC: 2.28 Mil/mm3 Low (03/17/17 06:36:00 EDT)    Hgb: 7.8 Gm/dL Low (33/29/51 88:41:66 EDT)    Hct: 23.7 % Low (03/17/17 06:36:00 EDT)    Platelet: 230 thous/mm3 (03/17/17 06:36:00 EDT)    MCV: 103.9 fL High (03/17/17 06:36:00 EDT)    MCH: 34.2 pGm High (03/17/17 06:36:00 EDT)    MCHC: 32.9 Gm/dL (01/02/15 01:09:32 EDT)    RDW-SD: 78.3 fL High (03/17/17 06:36:00 EDT)    MPV: 8.8 fL Low (03/17/17 06:36:00 EDT)    NRBC Percent: 0 % (03/17/17 06:36:00 EDT)    Absolute NRBC Count: 0 thous/mm3 (03/17/17 06:36:00 EDT)    Diagnostic Results    Impression and Plan    66 year female with history of        Chronic back pain    Peripheral neuropathy    Scoliosis    Bipolar disorder    Anxiety    Hypertension    COPD            was sent from rehab with complaints of shortness of breath. Patient on arrival  to the ER was hypoxic with oxygen sats in 80's and also required  bipap.        Acute hypoxic respiratory failure secondary to acute diastolic congestive  heart failure: Continue low salt diet, fluid restriction, IV diuretics to aim  a negative fluid balance. XR done today revealed improving pulmonary vascular  congestion.                Anemia: status post blood transfusion. Will need C scope : inpatient versus  outpatient once stable. Consider another unit of blood today. GI consult  requested.         Bipolar disorder: stable.        [2]    [1] Progress/SOAP Note; Zane Herald MD-HOSP, Willeen Cass 03/16/2017 08:18 EDT    [2] Progress/SOAP Note; Zane Herald MD-HOSP, Willeen Cass 03/16/2017 08:18 EDT    SIGNATURE LINE Electronically signed by Zane Herald MD-HOSP, Francessca Friis on 03/17/2017 at  17:44:34 EST

## 2017-03-15 NOTE — Discharge Summary (Signed)
Date of Admission    03/15/2017    Date of Discharge    03/21/2017    Admission History    64F COPD, HTN, Dep/anx, BPD, and GERD who was sent to the ER for hypoxia. The  patient was d/c'd yesterday after 9/7-10 admission for AKI, hyperkalemia,  bradycardia. During admission, she rec'd 7 liters IVF. Yesterday, she was  d/c'd to [facility] and then sent back to [facility] for O2 sat 80% on RA. Upon  arrival to ER, she was 75% on RA and placed on bipap. She also had a temp  100.1. She rec'd vanco/zosyn, dilaudid, morphine, oxycodone, and lasix 40 IV  with 1500cc uop. She notes 3 days of of bilateral lower chest/upper abdomen,  worse with eating and worse with breathing "like a gallbladder attack". no  BRBPR/melena. mild nausea, but no emesis. no diarrhea. She notes worsening  breathing, edema, abdominal distention over the last couple of days. Breathing  worse supine. CP only with deep breaths. New cough with mild production.  Denies any UTI sx, dysphagia. [1]    Code Status    Code Status - Ordered    -- 03/15/17 11:42:00 EDT, Full Resuscitation, Constant Order, patient wishes  to over ride previous DNR/DNI    Allergies    NKA    Social History    _Alcohol_    Current, 1-2 times per week    Past    _Substance Abuse_    Never    _Tobacco_    Former smoker, Cigarettes    Hospital Course            66 year female with history of        Chronic back pain    Peripheral neuropathy    Scoliosis    Bipolar disorder    Anxiety    Hypertension    COPD        was sent from rehab with complaints of shortness of breath. Patient on arrival  to the ER was hypoxic with oxygen sats in 80's and also required  bipap                Impression    #Acute hypoxic respiratory failure, resolved    #Acute on chronic diastolic congestive heart failure, resolved    #HTN, controlled    #Acute kidney Injury on CKD stage II (baseline Cr 0.9), likely due to  overdiuresis, FeNa 0.4%- resolving    #history of gastric bypass    #Anemia , macrocytic s/p 2  units pRBC    #S/p normal EGD 9/15    #COPD, stable    #history of smoking    #Mood disorder    #Ecchymoses, knee abrasions    #chronic pain syndrome    #Deconditioning, resolving                Plan    -now doing well on room air    -held lasix 9/15 in view of elevated creatinine. Calculated FeNa was 0.4% and NS @ 75 cc/hr for 500 total cc were given on 9/16. Patient feels well this morning and insists on going home though renal function now back to basline. Will need repeat labs 1-2 days. Resume lasix tomorrow- home dose increased to lasxi 40 mg PO daily, avoid NSAIDs and nephrotoxins. Continue to hold home amlodipine and lisinopril given SBP 100-110s    -Establish primary cardiology via referral by PCP this week    -Discontinue ASA    -continue PPI BID    -  Appreciate GI recommendations- normal EGD; outpatient f/u with primary GI recommended    -supportive care, repositioning, skin care, fall risk protocol, pain control, incentive spirometry    -Avoid benzos given history of falls. PT eval appreciated- home with VNA            DVT ppx: heparin    PPI    [2]                GI Note 9/16    Plan    Pt stable w/o any evidenc of ongoing blood loss    no further recommendations at this time    pt should follow up with her primary gi doctor as an outpt    will sign off    please call w/ any new questions or concerns  [3]            PT note 9/16    Assessment    PT Impairments or Limitations : Other: Eval completed. Doing well this date  from a mobility standpoint. Appears safe for return home with prior supports,  VNA PT home safety check. Pt with hx of multiple falls prior to last admission  --likely medical related. Gross safety appears    (Comment: intact, strength grossly ok.    [4]    Procedures and Treatment Provided    Procedure    EGD 9/15    indication: Anemia; abdominal pain; h/o esophageal ulcers    medication: as per anesthesia    findings: esophagus - WNL    gastric pouch- WNL    anastomosis - WNL    jejunun  - WNL    IMP: normal post op anatomy    REC: ok to advance diet    continue to monitor clinical course    if she remains stable - ok to d/c    follow up with GI as an outpt    [5]    Physical Exam    Vitals & Measurements    **T:** 98.3 F (Oral) **TMIN:** 98.1 F (Oral) **TMAX:** 98.6 F (Oral)  **HR:** 62 **RR:** 11 **BP:** 108/70 **SpO2:** 99% **O2 Method (L/min):** Room  air    General: AAO x 3, not in acute distress    Eye: PERRL    HEENT: Normocephalic, moist oral mucosa    Neck: supple, no JVD    Respiratory: Good bilateral air entry. Lungs clear to auscultation    Cardiovascular: s1 + s2, RRR, no murmurs. No pedal edema    Gastrointestinal: BS+, soft, right upper quadrant discomfort, non-distended,  no rebound or guarding    Genitourinary: No suprapubic or CVA tenderness    Integumentary: Skin intact, warm. No pallor, no rash.    Neurologic: no focal deficits [6]    Lab Results    Glucose Lvl: 94 mg/dL (16/10/96 04:54:09 EDT)    BUN: 30 mg/dL High (81/19/14 78:29:56 EDT)    Creatinine: 1.87 mg/dL High (21/30/86 57:84:69 EDT)    Afn Amer Glomerular Filtration Rate: 32 ml/min/1.65m2 (03/21/17 07:19:00 EDT)    Brent Bulla Amer Glomerular Filtration Rate: 28 ml/min/1.82m2 (03/21/17 07:19:00  EDT)    Sodium Lvl: 140 mmol/L (03/21/17 07:19:00 EDT)    Potassium Lvl: 3.8 mmol/L (03/21/17 07:19:00 EDT)    Chloride: 102 mmol/L (03/21/17 07:19:00 EDT)    CO2: 31 mmol/L (03/21/17 07:19:00 EDT)    Anion Gap: 7 (03/21/17 07:19:00 EDT)    Calcium Lvl: 8 mg/dL Low (62/95/28 41:32:44 EDT)    Phosphorus: 3.3 mg/dL (07/06/70 53:66:44 EDT)  Magnesium Lvl: 2.7 mg/dL High (27/25/36 64:40:34 EDT)    U Creat: 96 mg/dL (74/25/95 63:87:56 EDT)    U Sodium: 25 mmol/L (03/20/17 18:52:00 EDT)    WBC: 6 thous/mm3 (03/21/17 07:19:00 EDT)    RBC: 2.66 Mil/mm3 Low (03/21/17 07:19:00 EDT)    Hgb: 8.9 Gm/dL Low (43/32/95 18:84:16 EDT)    Hct: 26.6 % Low (03/21/17 07:19:00 EDT)    Platelet: 242 thous/mm3 (03/21/17 07:19:00 EDT)    MCV: 100 fL  (03/21/17 07:19:00 EDT)    MCH: 33.5 pGm (03/21/17 07:19:00 EDT)    MCHC: 33.5 Gm/dL (60/63/01 60:10:93 EDT)    RDW-SD: 67.6 fL High (03/21/17 07:19:00 EDT)    MPV: 8.6 fL Low (03/21/17 07:19:00 EDT)    Absolute Neutro Count: 4.4 thous/mm3 (03/21/17 07:19:00 EDT)    Absolute Lymphs Count: 0.85 thous/mm3 (03/21/17 07:19:00 EDT)    Absolute Mono Count: 0.61 thous/mm3 (03/21/17 07:19:00 EDT)    Absolute Eos Count: 0.06 thous/mm3 (03/21/17 07:19:00 EDT)    Absolute Baso Count: 0.02 thous/mm3 (03/21/17 07:19:00 EDT)    Neutrophils: 73.9 % (03/21/17 07:19:00 EDT)    Lymphocytes: 14.3 % (03/21/17 07:19:00 EDT)    Monocytes: 10.2 % (03/21/17 07:19:00 EDT)    Eosinophils: 1 % (03/21/17 07:19:00 EDT)    Basophils: 0.3 % (03/21/17 07:19:00 EDT)    Immature Granulocytes: 0.3 % (03/21/17 07:19:00 EDT)    NRBC Percent: 0 % (03/21/17 07:19:00 EDT)    Absolute NRBC Count: 0 thous/mm3 (03/21/17 07:19:00 EDT)    Blue Top Tube To Hold: DONE (03/21/17 07:19:00 EDT)    UA Color: Yellow1 (03/20/17 18:52:00 EDT)    UA Clarity: Clear1 (03/20/17 18:52:00 EDT)    UA Specific Gravity: 1.015 (03/20/17 18:52:00 EDT)    UA pH: 5 (03/20/17 18:52:00 EDT)    UA Protein: Negative1 (03/20/17 18:52:00 EDT)    UA Glucose: Negative1 (03/20/17 18:52:00 EDT)    UA Ketones: Negative1 (03/20/17 18:52:00 EDT)    UA Bilirubin: Negative1 (03/20/17 18:52:00 EDT)    UA Blood: Negative1 (03/20/17 18:52:00 EDT)    UA Urobilinogen: Negative1 (03/20/17 18:52:00 EDT)    UA Nitrite: Negative1 (03/20/17 18:52:00 EDT)    UA Leukocyte Esterase: Trace1 Abnormal (03/20/17 18:52:00 EDT)    UA RBC: 1 /HPF (03/20/17 18:52:00 EDT)    UA WBC: 7 /HPF High (03/20/17 18:52:00 EDT)    UA Squamous Epithelial: 1 /HPF (03/20/17 18:52:00 EDT)    UA Trans Epithelial: <1 High (03/20/17 18:52:00 EDT)    UA Bacteria: None1 (03/20/17 18:52:00 EDT)    UA Hyaline Casts: 3 /LPF (03/20/17 18:52:00 EDT)            CXR 9/14    IMPRESSION:        No acute cardiopulmonary process.         [7]    Discharge Diagnoses    Shortness of breath        #Acute hypoxic respiratory failure, resolved    #Acute on chronic diastolic congestive heart failure, resolved    #HTN, controlled    #Acute kidney Injury on CKD stage II (baseline Cr 0.9), likely due to  overdiuresis, FeNa 0.4%- resolving    #history of gastric bypass    #Anemia , macrocytic s/p 2 units pRBC    #S/p normal EGD 9/15    #COPD, stable    #history of smoking    #Mood disorder    #Ecchymoses, knee abrasions    #chronic pain syndrome    #Deconditioning, resolving  docusate, 100 mg = 1 cap(s), PO, BID, PRN PRN as needed for constipation, # 60  cap(s), 2 Refill(s), Pharmacy: [id number], Cap    furosemide, Dose: 40 mg = 1 tab(s), Tab, PO, Daily, First Dose Date/Time:  03/22/17 9:00:00 EDT    furosemide, 40 mg, PO, Daily, # 60 tab(s), 2 Refill(s), Pharmacy: [id number], Tab    Discharge Activity    Discharge Additional Treatments    Discharge Diet    Peripheral IV Removal    The Discharge Electronic Signature    Urine Culture    Discharge Medications    _Discharge_    aluminum hydroxide/magnesium hydroxide/simethicone 200 mg-200 mg-20 mg/5 mL  oral suspension, 30 mL, PO, q4hr, PRN    amitriptyline, 50 mg, PO, HS    atenolol, 50 mg, PO, BID    Breo Ellipta 100 mcg-25 mcg/inh inhalation powder, 1 puff(s), INHAL, Daily    clonazePAM 1 mg oral tablet, 1 mg, PO, BID    Cymbalta 60 mg oral delayed release capsule, 60 mg= 1 cap(s), PO, Daily    docusate sodium 100 mg oral capsule, 100 mg= 1 cap(s), PO, BID, PRN, 2 refills    ferrous gluconate 325 mg (36 mg elemental iron) oral tablet, 325 mg= 1 tab(s),  PO, BID    lactobacillus rhamnosus GG oral capsule, 1 cap(s), PO, Daily    Lasix 40 mg oral tablet, 40 mg, PO, Daily, 2 refills    Latuda, 120 mg, PO, HS    ondansetron 2 mg/mL injectable solution, 4 mg= 2 mL, IV Push, q8hr, PRN    oxyCODONE 10 mg oral tablet, 10 mg= 1 tab(s), PO, BID    pantoprazole 40 mg oral delayed release tablet,  40 mg= 1 tab(s), PO, BID    Spiriva 18 mcg inhalation capsule, 18 mcg= 1 cap(s), INHAL, Daily    tiZANidine 4 mg oral tablet, 4 mg= 1 tab(s), PO, QID, PRN    Vitamin B12 250 mcg oral tablet, 250 mcg= 1 tab(s), PO, Daily    Vitamin D3, 1000 Unit(s), PO, Daily    Discharge Instructions        home dose increased to lasix 40 mg PO daily, resume tomorrow. Monitor for leg  swelling or increasing dyspnea with exertion. Home visiting nursing to assist  with daily weights    PCP f/u with Dr. Nevin Bloodgood within 4-5 days. Recheck CBC, CMP, coags in  1-2 days    Continue to hold home amlodipine and lisinopril given low SBP 100-110s    Do continue home beta blocker    Establish primary cardiology via referral by PCP this week    Discontinue ASA    continue PPI BID    outpatient f/u with primary GI recommended    Avoid benzos given history of falls    Pulm f/u for COPD. Resume home inhalers    Counseling        medication compliance discussed with the patient    Adequate fluid balance, hydration while on lasix discussed extensively    Annual flu vaccination recommended      Face to Face        I spent 45 minutes coordinating plan of care and providing discharge  instructions for the patient at bedside. Patient will be discharged to home  with home VNA    [1] Admission H & Elon Alas MD, Jody 03/15/2017 11:44 EDT    [2] Progress/SOAP Note; Gerrie Nordmann MD 03/20/2017 11:30 EDT    [3] GI progress  note; Gerre Pebbles MD, Margarette Asal 03/20/2017 09:12 EDT    [4] Inpatient PT Examination; Delene Ruffini PT ([place] [facility] [address]) 60/45/4098 11:91  EDT    [5] Brief endoscopy note; Gerre Pebbles MD, Margarette Asal 03/19/2017 10:50 EDT    [6] Progress/SOAP Note; Allena Katz Wilgus Deyton MD 03/20/2017 11:30 EDT    [7] XR Chest Two Views; Zettie Pho MD, Lorin Picket 03/18/2017 15:04 EDT    SIGNATURE LINE Electronically signed by Allena Katz MD, Tavie Haseman on 03/21/2017 at  15:15:02 EST

## 2017-03-15 NOTE — Progress Notes (Signed)
Subjective    Today feels better and denies any shortness of breath.    Review of Systems    Objective    Vitals & Measurements    **T:** 98.0 F (Oral) **TMIN:** 97.4 F (Oral) **TMAX:** 99.6 F (Oral)  **HR:** 60 **RR:** 17 **BP:** 116/106 **SpO2:** 95% **O2 Method (L/min):**  Room air **WT:** 80.4 Kg    Physical Exam    General: Alert and oriented, no acute distress    Eye: PERRL, Normal Conjunctiva    HEENT: Normocephalic, no damage to dentition, moist oral mucosa, no pharyngeal  erythema, no sinus tenderness    Neck: supple, nontender, carotid pulse WNL, no carotid bruit, no JVD, no  lymphadenopathy, no thyromegaly, full ROM    Respiratory: Lungs crackles to auscultation, respirations non-labored, breath  sounds equal and regular, symmetrical chest expansion, no chest wall  tenderness    Cardiovascular: Normal Rate, normal rhythm, _ beats/minute, no gallop, good  pulses equal in all extremities, normal peripheral perfusion, no edema.    Gastrointestinal: Abdomen soft, non tender, non-distended, normal bowel sounds  all four quadrants, no organomegaly,    Integumentary: Skin intact, warm, pink, dry. No pallor, no rash.    Neurologic: Alert, Oriented, normal sensory, normal motor, no focal deficits.  [1] [1]    Medications    _Inpatient_    acetaminophen tablet, 650 mg= 2 tab(s), PO, q4hr, PRN    Al hydroxide/Mg hydroxide/simethicone, 30 mL, PO, q4hr, PRN    albuterol 2.5 mg/3 mL (0.083%) inhalation solution, 2.5 mg= 3 mL, NEB, q4hr,  PRN    amitriptyline, 50 mg= 1 tab(s), PO, HS    aspirin 81 mg oral enteric coated tablet, 81 mg= 1 tab(s), PO, Daily    atenolol, 50 mg= 1 tab(s), PO, BID    bisacodyl, 10 mg= 1 supp, PR, Daily, PRN    budesonide 0.5 mg/2 mL inhalation suspension, 0.5 mg= 2 mL, NEB, BID    clonazePAM, 1 mg= 1 tab(s), PO, BID    Culturelle Digestive Health oral capsule, 1 cap(s), PO, Daily    Cymbalta, 60 mg= 2 cap(s), PO, Daily    docusate sodium, 100 mg= 1 cap(s), PO, BID    enoxaparin, 40  mg= 0.4 mL, sc, q24hr    formoterol 20 mcg/2 mL inhalation solution, 20 mcg= 2 mL, NEB, BID    Lasix, 40 mg= 4 mL, IV Push, TID    Latuda, 120 mg= 6 tab(s), PO, HS    Maalox, 30 mL, PO, q4hr, PRN    morPHINE, 2 mg= 1 mL, IV Push, q4hr, PRN    Mucinex, 600 mg= 1 tab(s), PO, q12hr    NaCl 0.9% bolus 500 mL, 500 mL, IV    ondansetron, 4 mg= 2 mL, IV Push, q8hr, PRN    oxyCODONE, 10 mg= 2 tab(s), PO, q6hr, PRN    pantoprazole, 40 mg= 1 tab(s), PO, BID    Robitussin, 100 mg= 5 mL, PO, q4hr, PRN    Sodium Chloride 0.9% 250 mL, 250 mL, IV    Sodium Chloride 0.9% 250 mL, 250 mL, IV    Zanaflex, 4 mg= 1 tab(s), PO, QID, PRN    _Home_    aluminum hydroxide/magnesium hydroxide/simethicone 200 mg-200 mg-20 mg/5 mL  oral suspension, 30 mL, PO, q4hr, PRN    amitriptyline, 50 mg, PO, HS    amLODIPine, 5 mg, PO, Once    atenolol, 50 mg, PO, BID    Breo Ellipta 100 mcg-25 mcg/inh inhalation powder, 1 puff(s), INHAL, Daily  clonazePAM 1 mg oral tablet, 1 mg, PO, BID    Cymbalta 60 mg oral delayed release capsule, 60 mg= 1 cap(s), PO, Daily    ferrous gluconate 325 mg (36 mg elemental iron) oral tablet, 325 mg= 1 tab(s),  PO, BID    lactobacillus rhamnosus GG oral capsule, 1 cap(s), PO, Daily    Lasix, 40 mg, PO, Daily    Lasix 20 mg oral tablet, 20 mg= 1 tab(s), PO, Daily    Latuda, 120 mg, PO, HS    ondansetron 2 mg/mL injectable solution, 4 mg= 2 mL, IV Push, q8hr, PRN    oxyCODONE 10 mg oral tablet, 10 mg= 1 tab(s), PO, BID    pantoprazole 40 mg oral delayed release tablet, 40 mg= 1 tab(s), PO, BID    Spiriva 18 mcg inhalation capsule, 18 mcg= 1 cap(s), INHAL, Daily    sucralfate 1 g oral tablet, 1 Gm= 1 tab(s), PO, QID    tiZANidine 4 mg oral tablet, 4 mg= 1 tab(s), PO, QID, PRN    Vitamin B12 250 mcg oral tablet, 250 mcg= 1 tab(s), PO, Daily    Vitamin D3, 1000 Unit(s), PO, Daily    Lab Results    Glucose Lvl: 90 mg/dL (09/81/19 14:78:29 EDT)    BUN: 18 mg/dL (56/21/30 86:57:84 EDT)    Creatinine: 1.69 mg/dL High (69/62/95  28:41:32 EDT)    Afn Amer Glomerular Filtration Rate: 36 ml/min/1.23m2 (03/18/17 07:48:00 EDT)    Brent Bulla Amer Glomerular Filtration Rate: 31 ml/min/1.28m2 (03/18/17 07:48:00  EDT)    Sodium Lvl: 139 mmol/L (03/18/17 07:48:00 EDT)    Potassium Lvl: 3.8 mmol/L (03/18/17 07:48:00 EDT)    Chloride: 100 mmol/L (03/18/17 07:48:00 EDT)    CO2: 33 mmol/L High (03/18/17 07:48:00 EDT)    Anion Gap: 6 (03/18/17 07:48:00 EDT)    Calcium Lvl: 8.2 mg/dL Low (44/01/02 72:53:66 EDT)    WBC: 5.3 thous/mm3 (03/18/17 07:48:00 EDT)    RBC: 2.84 Mil/mm3 Low (03/18/17 07:48:00 EDT)    Hgb: 9.6 Gm/dL Low (44/03/47 42:59:56 EDT)    Hct: 28.8 % Low (03/18/17 07:48:00 EDT)    Platelet: 249 thous/mm3 (03/18/17 07:48:00 EDT)    MCV: 101.4 fL High (03/18/17 07:48:00 EDT)    MCH: 33.8 pGm (03/18/17 07:48:00 EDT)    MCHC: 33.3 Gm/dL (38/75/64 33:29:51 EDT)    RDW-SD: 80.6 fL High (03/18/17 07:48:00 EDT)    MPV: 8.6 fL Low (03/18/17 07:48:00 EDT)    NRBC Percent: 0 % (03/18/17 07:48:00 EDT)    Absolute NRBC Count: 0 thous/mm3 (03/18/17 07:48:00 EDT)    TRANSFUSED: TRANSFUSED (03/17/17 13:49:52 EDT)    Diagnostic Results    Impression and Plan    66 year female with history of        Chronic back pain    Peripheral neuropathy    Scoliosis    Bipolar disorder    Anxiety    Hypertension    COPD            was sent from rehab with complaints of shortness of breath. Patient on arrival  to the ER was hypoxic with oxygen sats in 80's and also required  bipap.        Acute hypoxic respiratory failure secondary to acute diastolic congestive  heart failure: Continue low salt diet, fluid restriction, IV diuretics to aim  a negative fluid balance.    Check repeat Cxray today        Anemia: status post 2 units blood transfusion. GI consult noted.  Awaiting ?  UGI endoscopy.        Bipolar disorder: stable.    [2]    [1] Progress/SOAP Note; Zane Herald MD-HOSP, Caidance Sybert 03/17/2017 17:43 EDT    [2] Progress/SOAP Note; Zane Herald MD-HOSP, Willeen Cass 03/17/2017 17:43 EDT    SIGNATURE  LINE Electronically signed by Zane Herald MD-HOSP, Tanmay Halteman on 03/18/2017 at  14:13:33 EST

## 2017-03-15 NOTE — Progress Notes (Signed)
Subjective    patient complains of persistent abd discomfort        went for EGD this morning with normal findings    Review of Systems    Objective    Vitals & Measurements    **T:** 98.1 F (Oral) **TMIN:** 97.8 F (Oral) **TMAX:** 99.6 F (Oral)  **HR:** 65 **RR:** 12 **BP:** 133/78 **SpO2:** 99% **O2 Rate:** 2 **O2 Method  (L/min):** Nasal cannula **WT:** 80.9 Kg    Physical Exam    General: AAO x 3, not in acute distress    Eye: PERRL    HEENT: Normocephalic, moist oral mucosa    Neck: supple, no JVD    Respiratory: Good bilateral air entry. Lungs clear to auscultation    Cardiovascular: s1 + s2, RRR, no murmurs. No pedal edema    Gastrointestinal: BS+, soft, right upper quadrant discomfort, non-distended,  no rebound or guarding    Genitourinary: No suprapubic or CVA tenderness    Integumentary: Skin intact, warm. No pallor, no rash.    Neurologic: no focal deficits    Medications    _Inpatient_    acetaminophen tablet, 650 mg= 2 tab(s), PO, q4hr, PRN    Al hydroxide/Mg hydroxide/simethicone, 30 mL, PO, q4hr, PRN    albuterol 2.5 mg/3 mL (0.083%) inhalation solution, 2.5 mg= 3 mL, NEB, q4hr,  PRN    amitriptyline, 50 mg= 1 tab(s), PO, HS    aspirin 81 mg oral enteric coated tablet, 81 mg= 1 tab(s), PO, Daily    atenolol, 50 mg= 1 tab(s), PO, BID    bisacodyl, 10 mg= 1 supp, PR, Daily, PRN    budesonide 0.5 mg/2 mL inhalation suspension, 0.5 mg= 2 mL, NEB, BID    clonazePAM, 1 mg= 1 tab(s), PO, BID    Culturelle Digestive Health oral capsule, 1 cap(s), PO, Daily    Cymbalta, 60 mg= 2 cap(s), PO, Daily    docusate sodium, 100 mg= 1 cap(s), PO, BID    enoxaparin, 40 mg= 0.4 mL, sc, q24hr    formoterol 20 mcg/2 mL inhalation solution, 20 mcg= 2 mL, NEB, BID    Lactated Ringers 1,000 mL, 1000 mL, IV    Latuda, 120 mg= 6 tab(s), PO, HS    Maalox, 30 mL, PO, q4hr, PRN    Mucinex, 600 mg= 1 tab(s), PO, q12hr    NaCl 0.9% bolus 500 mL, 500 mL, IV    ondansetron, 4 mg= 2 mL, IV Push, q8hr, PRN    oxyCODONE, 10  mg= 2 tab(s), PO, q6hr, PRN    pantoprazole, 40 mg= 1 tab(s), PO, BID    Robitussin, 100 mg= 5 mL, PO, q4hr, PRN    Sodium Chloride 0.9% 250 mL, 250 mL, IV    Sodium Chloride 0.9% 250 mL, 250 mL, IV    Zanaflex, 4 mg= 1 tab(s), PO, QID, PRN    _Home_    aluminum hydroxide/magnesium hydroxide/simethicone 200 mg-200 mg-20 mg/5 mL  oral suspension, 30 mL, PO, q4hr, PRN    amitriptyline, 50 mg, PO, HS    amLODIPine, 5 mg, PO, Once    atenolol, 50 mg, PO, BID    Breo Ellipta 100 mcg-25 mcg/inh inhalation powder, 1 puff(s), INHAL, Daily    clonazePAM 1 mg oral tablet, 1 mg, PO, BID    Cymbalta 60 mg oral delayed release capsule, 60 mg= 1 cap(s), PO, Daily    ferrous gluconate 325 mg (36 mg elemental iron) oral tablet, 325 mg= 1 tab(s),  PO, BID  lactobacillus rhamnosus GG oral capsule, 1 cap(s), PO, Daily    Lasix, 40 mg, PO, Daily    Lasix 20 mg oral tablet, 20 mg= 1 tab(s), PO, Daily    Latuda, 120 mg, PO, HS    ondansetron 2 mg/mL injectable solution, 4 mg= 2 mL, IV Push, q8hr, PRN    oxyCODONE 10 mg oral tablet, 10 mg= 1 tab(s), PO, BID    pantoprazole 40 mg oral delayed release tablet, 40 mg= 1 tab(s), PO, BID    Spiriva 18 mcg inhalation capsule, 18 mcg= 1 cap(s), INHAL, Daily    sucralfate 1 g oral tablet, 1 Gm= 1 tab(s), PO, QID    tiZANidine 4 mg oral tablet, 4 mg= 1 tab(s), PO, QID, PRN    Vitamin B12 250 mcg oral tablet, 250 mcg= 1 tab(s), PO, Daily    Vitamin D3, 1000 Unit(s), PO, Daily    Lab Results    Glucose Lvl: 83 mg/dL (54/09/81 19:14:78 EDT)    BUN: 23 mg/dL High (29/56/21 30:86:57 EDT)    Creatinine: 1.98 mg/dL High (84/69/62 95:28:41 EDT)    Afn Amer Glomerular Filtration Rate: 30 ml/min/1.27m2 (03/19/17 07:23:00 EDT)    Brent Bulla Amer Glomerular Filtration Rate: 26 ml/min/1.17m2 (03/19/17 07:23:00  EDT)    Sodium Lvl: 138 mmol/L (03/19/17 07:23:00 EDT)    Potassium Lvl: 3.6 mmol/L (03/19/17 07:23:00 EDT)    Chloride: 96 mmol/L Low (03/19/17 07:23:00 EDT)    CO2: 33 mmol/L High (03/19/17 07:23:00  EDT)    Anion Gap: 9 (03/19/17 07:23:00 EDT)    Calcium Lvl: 8.7 mg/dL (32/44/01 02:72:53 EDT)    Magnesium Lvl: 2.3 mg/dL (66/44/03 47:42:59 EDT)    WBC: 6.8 thous/mm3 (03/19/17 07:23:00 EDT)    RBC: 3.15 Mil/mm3 Low (03/19/17 07:23:00 EDT)    Hgb: 10.5 Gm/dL Low (56/38/75 64:33:29 EDT)    Hct: 31.7 % Low (03/19/17 07:23:00 EDT)    Platelet: 235 thous/mm3 (03/19/17 07:23:00 EDT)    MCV: 100.6 fL High (03/19/17 07:23:00 EDT)    MCH: 33.3 pGm (03/19/17 07:23:00 EDT)    MCHC: 33.1 Gm/dL (51/88/41 66:06:30 EDT)    RDW-SD: 73.9 fL High (03/19/17 07:23:00 EDT)    MPV: 9.7 fL (03/19/17 07:23:00 EDT)    NRBC Percent: 0 % (03/19/17 07:23:00 EDT)    Absolute NRBC Count: 0 thous/mm3 (03/19/17 07:23:00 EDT)                CXR 9/14    IMPRESSION:        No acute cardiopulmonary process.    [1]      Diagnostic Results    Impression and Plan    CHF - Congestive heart failure            66 year female with history of        Chronic back pain    Peripheral neuropathy    Scoliosis    Bipolar disorder    Anxiety    Hypertension    COPD        was sent from rehab with complaints of shortness of breath. Patient on arrival  to the ER was hypoxic with oxygen sats in 80's and also required  bipap                Impression    #Acute hypoxic respiratory failure, resolving    #Acute on chronic diastolic congestive heart failure, resolving    #HTN, controlled    #Acute kidney Injury on CKD stage II (baseline Cr 0.9), possibly  due to  overdiuresis    #history of gastric bypass    #Anemia , macrocytic s/p 2 units pRBC    #S/p normal EGD 9/15    #COPD, stable    #history of smoking    #Mood disorder    #Ecchymoses, knee abrasions    #chronic pain syndrome    #Deconditioning                Plan    -wean oxygen for goal SpO2 92%    -hold lasix today in view of elevated creatinine. Fluids given at endo suite. Check BMP in AM, intake and output, avoid NSAIDs and nephrotoxins    -Avoid benzos given history of falls. PT eval; patient likely to be  discharged back to rehab when medically stable    -supportive care, repositioning, skin care, fall risk protocol, pain control, incentive spirometry            DVT ppx: heparin    PPI                FULL CODE [3]    [1] XR Chest Two Views; Zettie Pho MD, [place] 03/18/2017 15:04 EDT    [2] Progress/SOAP Note; Zane Herald MD-HOSP, Willeen Cass 03/18/2017 12:11 EDT    [3] Progress/SOAP Note; Allena Katz Mohmed Farver MD 03/13/2017 17:22 EDT    SIGNATURE LINE Electronically signed by Allena Katz MD, Ryo Klang on 03/19/2017 at  13:07:57 EST

## 2017-03-15 NOTE — Progress Notes (Signed)
Subjective    History and physical reviewed. Today feels better in terms of shortness of  breath.    Review of Systems    Objective    Vitals & Measurements    **T:** 99.5 F (Oral) **TMIN:** 97.2 F (Axillary) **TMAX:** 99.5 F (Oral)  **HR:** 72 **RR:** 19 **BP:** 117/73 **SpO2:** 98% **O2 Rate:** 4 **O2 Method  (L/min):** Nasal cannula **WT:** 87 Kg    Physical Exam    General: Alert and oriented, no acute distress    Eye: PERRL, Normal Conjunctiva    HEENT: Normocephalic, no damage to dentition, moist oral mucosa, no pharyngeal  erythema, no sinus tenderness    Neck: supple, nontender, carotid pulse WNL, no carotid bruit, no JVD, no  lymphadenopathy, no thyromegaly, full ROM    Respiratory: Lungs crackles to auscultation, respirations non-labored, breath  sounds equal and regular, symmetrical chest expansion, no chest wall  tenderness    Cardiovascular: Normal Rate, normal rhythm, _ beats/minute, no gallop, good  pulses equal in all extremities, normal peripheral perfusion, no edema.    Gastrointestinal: Abdomen soft, non tender, non-distended, normal bowel sounds  all four quadrants, no organomegaly,    Integumentary: Skin intact, warm, pink, dry. No pallor, no rash.    Neurologic: Alert, Oriented, normal sensory, normal motor, no focal deficits.    Medications    _Inpatient_    acetaminophen tablet, 650 mg= 2 tab(s), PO, q4hr, PRN    Al hydroxide/Mg hydroxide/simethicone, 30 mL, PO, q4hr, PRN    albuterol 2.5 mg/3 mL (0.083%) inhalation solution, 2.5 mg= 3 mL, NEB, q4hr,  PRN    amitriptyline, 50 mg= 1 tab(s), PO, HS    aspirin 81 mg oral enteric coated tablet, 81 mg= 1 tab(s), PO, Daily    atenolol, 50 mg= 1 tab(s), PO, BID    bisacodyl, 10 mg= 1 supp, PR, Daily, PRN    budesonide 0.5 mg/2 mL inhalation suspension, 0.5 mg= 2 mL, NEB, BID    clonazePAM, 1 mg= 1 tab(s), PO, BID    Culturelle Digestive Health oral capsule, 1 cap(s), PO, Daily    Cymbalta, 60 mg= 2 cap(s), PO, Daily    docusate sodium, 100  mg= 1 cap(s), PO, BID    enoxaparin, 40 mg= 0.4 mL, sc, q24hr    ferrous sulfate, 325 mg= 1 tab(s), PO, BID    formoterol 20 mcg/2 mL inhalation solution, 20 mcg= 2 mL, NEB, BID    Lasix, 40 mg= 4 mL, IV Push, TID    Latuda, 120 mg= 6 tab(s), PO, HS    Maalox, 30 mL, PO, q4hr, PRN    morPHINE, 2 mg= 1 mL, IV Push, q4hr, PRN    Mucinex, 600 mg= 1 tab(s), PO, q12hr    NaCl 0.9% bolus 500 mL, 500 mL, IV    ondansetron, 4 mg= 2 mL, IV Push, q8hr, PRN    oxyCODONE, 10 mg= 2 tab(s), PO, q6hr, PRN    pantoprazole, 40 mg= 1 tab(s), PO, BID    Potassium Chloride Oral, 40 mEq= 2 tab(s), PO, q4hr    Robitussin, 100 mg= 5 mL, PO, q4hr, PRN    Sodium Chloride 0.9% 250 mL, 250 mL, IV    Zanaflex, 4 mg= 1 tab(s), PO, QID, PRN    _Home_    aluminum hydroxide/magnesium hydroxide/simethicone 200 mg-200 mg-20 mg/5 mL  oral suspension, 30 mL, PO, q4hr, PRN    amitriptyline, 50 mg, PO, HS    amLODIPine, 5 mg, PO, Once    atenolol, 50  mg, PO, BID    Breo Ellipta 100 mcg-25 mcg/inh inhalation powder, 1 puff(s), INHAL, Daily    clonazePAM 1 mg oral tablet, 1 mg, PO, BID    Cymbalta 60 mg oral delayed release capsule, 60 mg= 1 cap(s), PO, Daily    ferrous gluconate 325 mg (36 mg elemental iron) oral tablet, 325 mg= 1 tab(s),  PO, BID    lactobacillus rhamnosus GG oral capsule, 1 cap(s), PO, Daily    Lasix, 40 mg, PO, Daily    Lasix 20 mg oral tablet, 20 mg= 1 tab(s), PO, Daily    Latuda, 120 mg, PO, HS    ondansetron 2 mg/mL injectable solution, 4 mg= 2 mL, IV Push, q8hr, PRN    oxyCODONE 10 mg oral tablet, 10 mg= 1 tab(s), PO, BID    pantoprazole 40 mg oral delayed release tablet, 40 mg= 1 tab(s), PO, BID    Spiriva 18 mcg inhalation capsule, 18 mcg= 1 cap(s), INHAL, Daily    sucralfate 1 g oral tablet, 1 Gm= 1 tab(s), PO, QID    tiZANidine 4 mg oral tablet, 4 mg= 1 tab(s), PO, QID, PRN    Vitamin B12 250 mcg oral tablet, 250 mcg= 1 tab(s), PO, Daily    Vitamin D3, 1000 Unit(s), PO, Daily    Lab Results    Glucose Lvl: 112 mg/dL High  (16/10/96 04:54:09 EDT)    BUN: 12 mg/dL (81/19/14 78:29:56 EDT)    Creatinine: 1.08 mg/dL (21/30/86 57:84:69 EDT)    Afn Amer Glomerular Filtration Rate: 62 ml/min/1.4m2 (03/16/17 01:57:00 EDT)    Brent Bulla Amer Glomerular Filtration Rate: 54 ml/min/1.38m2 (03/16/17 01:57:00  EDT)    Sodium Lvl: 135 mmol/L Low (03/16/17 01:57:00 EDT)    Potassium Lvl: 3.4 mmol/L Low (03/16/17 01:57:00 EDT)    Chloride: 96 mmol/L Low (03/16/17 01:57:00 EDT)    CO2: 32 mmol/L (03/16/17 01:57:00 EDT)    Anion Gap: 7 (03/16/17 01:57:00 EDT)    Total Protein: 5.6 Gm/dL Low (62/95/28 41:32:44 EDT)    Albumin Lvl: 2.3 Gm/dL Low (07/06/70 53:66:44 EDT)    Albumin Lvl: 2.3 Gm/dL Low (03/47/42 59:56:38 EDT)    Calcium Lvl: 7.6 mg/dL Low (75/64/33 29:51:88 EDT)    Bilirubin Total: 1.3 mg/dL High (41/66/06 30:16:01 EDT)    Bilirubin Direct: 0.5 mg/dL High (09/32/35 57:32:20 EDT)    Alkaline Phosphatase: 84 Units/L (03/16/17 01:57:00 EDT)    AST: 19 Units/L (03/16/17 01:57:00 EDT)    ALT: 10 Units/L (03/16/17 01:57:00 EDT)    Lactic Acid Lvl: 0.9 mmol/L (03/16/17 01:57:00 EDT)    WBC: 7.1 thous/mm3 (03/16/17 08:07:00 EDT)    RBC: 2.32 Mil/mm3 Low (03/16/17 08:07:00 EDT)    Hgb: 7.9 Gm/dL Low (25/42/70 62:37:62 EDT)    Hct: 23.7 % Low (03/16/17 08:07:00 EDT)    Platelet: 179 thous/mm3 (03/16/17 08:07:00 EDT)    MCV: 102.2 fL High (03/16/17 08:07:00 EDT)    MCH: 34.1 pGm High (03/16/17 08:07:00 EDT)    MCHC: 33.3 Gm/dL (83/15/17 61:60:73 EDT)    RDW-SD: 77.9 fL High (03/16/17 08:07:00 EDT)    MPV: 8.4 fL Low (03/16/17 08:07:00 EDT)    NRBC Percent: 0 % (03/16/17 08:07:00 EDT)    Absolute NRBC Count: 0 thous/mm3 (03/16/17 08:07:00 EDT)    FiO2 Conc: 45 (03/15/17 11:06:00 EDT)    U Legionella Ag: Negative (03/15/17 14:41:00 EDT)    Strep pneumo Antigen Urine: Negative (03/15/17 14:41:00 EDT)    Isaias Sakai: A POS (03/16/17 01:57:00 EDT)    Antibody Screen: Negative (  03/16/17 01:57:00 EDT)    Electronic Crossmatch: Computer XM OK (03/16/17 01:57:00  EDT)    Diagnostic Results    Impression and Plan    66 year female with history of        Chronic back pain    Peripheral neuropathy    Scoliosis    Bipolar disorder    Anxiety    Hypertension    COPD            was sent from rehab with complaints of shortness of breath. Patient on arrival  to the ER was hypoxic with oxygen sats in 80's and also required  bipap.        Acute hypoxic respiratory failure secondary to acute diastolic congestive  heart failure: Continue low salt diet, fluid restriction, IV diuretics to aim  a negative fluid balance. XR done today revealed improving pulmonary vascular  congestion.    patient does not have any clinical symptoms or signs of pneumonia so will dc  antibiotics.            Anemia: status post blood transfusion. Will need C scope : inpatient versus  outpatient once stable.        Bipolar disorder: stable.                          SIGNATURE LINE Electronically signed by Zane Herald MD-HOSP, Aalijah Mims on 03/16/2017 at  14:50:27 EST

## 2017-03-15 NOTE — Progress Notes (Signed)
Subjective    patient states that her breathing is ok        blood pressure low this morning and orthostatics positive    Review of Systems    Objective    Vitals & Measurements    **T:** 98.6 F (Oral) **TMIN:** 98.2 F (Oral) **TMAX:** 98.6 F (Oral)  **HR:** 68 **RR:** 13 **BP:** 137/75 **SpO2:** 96% **O2 Method (L/min):** Room  air    Physical Exam    General: AAO x 3, not in acute distress    Eye: PERRL    HEENT: Normocephalic, moist oral mucosa    Neck: supple, no JVD    Respiratory: Good bilateral air entry. Lungs clear to auscultation    Cardiovascular: s1 + s2, RRR, no murmurs. No pedal edema    Gastrointestinal: BS+, soft, right upper quadrant discomfort, non-distended,  no rebound or guarding    Genitourinary: No suprapubic or CVA tenderness    Integumentary: Skin intact, warm. No pallor, no rash.    Neurologic: no focal deficits [1]    Medications    _Inpatient_    acetaminophen tablet, 650 mg= 2 tab(s), PO, q4hr, PRN    Al hydroxide/Mg hydroxide/simethicone, 30 mL, PO, q4hr, PRN    albuterol 2.5 mg/3 mL (0.083%) inhalation solution, 2.5 mg= 3 mL, NEB, q4hr,  PRN    amitriptyline, 50 mg= 1 tab(s), PO, HS    aspirin 81 mg oral enteric coated tablet, 81 mg= 1 tab(s), PO, Daily    atenolol, 50 mg= 1 tab(s), PO, BID    bisacodyl, 10 mg= 1 supp, PR, Daily, PRN    budesonide 0.5 mg/2 mL inhalation suspension, 0.5 mg= 2 mL, NEB, BID    clonazePAM, 1 mg= 1 tab(s), PO, BID    Culturelle Digestive Health oral capsule, 1 cap(s), PO, Daily    Cymbalta, 60 mg= 2 cap(s), PO, Daily    docusate sodium, 100 mg= 1 cap(s), PO, BID    formoterol 20 mcg/2 mL inhalation solution, 20 mcg= 2 mL, NEB, BID    heparin, 5000 Unit(s)= 1 mL, sc, q8hr    Latuda, 120 mg= 6 tab(s), PO, HS    Maalox, 30 mL, PO, q4hr, PRN    Mucinex, 600 mg= 1 tab(s), PO, q12hr    normal saline 500 mL, 500 mL, IV    ondansetron, 4 mg= 2 mL, IV Push, q8hr, PRN    oxyCODONE, 10 mg= 2 tab(s), PO, q6hr, PRN    pantoprazole, 40 mg= 1 tab(s), PO,  BID    Robitussin, 100 mg= 5 mL, PO, q4hr, PRN    Zanaflex, 4 mg= 1 tab(s), PO, QID, PRN    _Home_    aluminum hydroxide/magnesium hydroxide/simethicone 200 mg-200 mg-20 mg/5 mL  oral suspension, 30 mL, PO, q4hr, PRN    amitriptyline, 50 mg, PO, HS    amLODIPine, 5 mg, PO, Once    atenolol, 50 mg, PO, BID    Breo Ellipta 100 mcg-25 mcg/inh inhalation powder, 1 puff(s), INHAL, Daily    clonazePAM 1 mg oral tablet, 1 mg, PO, BID    Cymbalta 60 mg oral delayed release capsule, 60 mg= 1 cap(s), PO, Daily    ferrous gluconate 325 mg (36 mg elemental iron) oral tablet, 325 mg= 1 tab(s),  PO, BID    lactobacillus rhamnosus GG oral capsule, 1 cap(s), PO, Daily    Lasix, 40 mg, PO, Daily    Lasix 20 mg oral tablet, 20 mg= 1 tab(s), PO, Daily    Latuda, 120 mg,  PO, HS    ondansetron 2 mg/mL injectable solution, 4 mg= 2 mL, IV Push, q8hr, PRN    oxyCODONE 10 mg oral tablet, 10 mg= 1 tab(s), PO, BID    pantoprazole 40 mg oral delayed release tablet, 40 mg= 1 tab(s), PO, BID    Spiriva 18 mcg inhalation capsule, 18 mcg= 1 cap(s), INHAL, Daily    sucralfate 1 g oral tablet, 1 Gm= 1 tab(s), PO, QID    tiZANidine 4 mg oral tablet, 4 mg= 1 tab(s), PO, QID, PRN    Vitamin B12 250 mcg oral tablet, 250 mcg= 1 tab(s), PO, Daily    Vitamin D3, 1000 Unit(s), PO, Daily    Lab Results    Glucose Lvl: 86 mg/dL (16/10/96 04:54:09 EDT)    BUN: 31 mg/dL High (81/19/14 78:29:56 EDT)    Creatinine: 2.08 mg/dL High (21/30/86 57:84:69 EDT)    Afn Amer Glomerular Filtration Rate: 28 ml/min/1.37m2 (03/20/17 07:04:00 EDT)    Brent Bulla Amer Glomerular Filtration Rate: 24 ml/min/1.34m2 (03/20/17 07:04:00  EDT)    Sodium Lvl: 136 mmol/L (03/20/17 07:04:00 EDT)    Potassium Lvl: 3.6 mmol/L (03/20/17 07:04:00 EDT)    Chloride: 98 mmol/L (03/20/17 07:04:00 EDT)    CO2: 30 mmol/L (03/20/17 07:04:00 EDT)    Anion Gap: 8 (03/20/17 07:04:00 EDT)    Calcium Lvl: 8.4 mg/dL Low (62/95/28 41:32:44 EDT)    Phosphorus: 3.5 mg/dL (07/06/70 53:66:44 EDT)    Magnesium  Lvl: 2.4 mg/dL (03/47/42 59:56:38 EDT)    U Creat: 96 mg/dL (75/64/33 29:51:88 EDT)    U Sodium: 25 mmol/L (03/20/17 18:52:00 EDT)    WBC: 7.1 thous/mm3 (03/20/17 08:45:00 EDT)    RBC: 2.96 Mil/mm3 Low (03/20/17 08:45:00 EDT)    Hgb: 9.6 Gm/dL Low (41/66/06 30:16:01 EDT)    Hct: 29.6 % Low (03/20/17 08:45:00 EDT)    Platelet: 294 thous/mm3 (03/20/17 08:45:00 EDT)    MCV: 100 fL (03/20/17 08:45:00 EDT)    MCH: 32.4 pGm (03/20/17 08:45:00 EDT)    MCHC: 32.4 Gm/dL (09/32/35 57:32:20 EDT)    RDW-SD: 69.6 fL High (03/20/17 08:45:00 EDT)    MPV: 8.9 fL Low (03/20/17 08:45:00 EDT)    Absolute Neutro Count: 5.33 thous/mm3 (03/20/17 08:45:00 EDT)    Absolute Lymphs Count: 1.01 thous/mm3 (03/20/17 08:45:00 EDT)    Absolute Mono Count: 0.67 thous/mm3 (03/20/17 08:45:00 EDT)    Absolute Eos Count: 0.08 thous/mm3 (03/20/17 08:45:00 EDT)    Absolute Baso Count: 0.02 thous/mm3 (03/20/17 08:45:00 EDT)    Neutrophils: 74.7 % (03/20/17 08:45:00 EDT)    Lymphocytes: 14.2 % (03/20/17 08:45:00 EDT)    Monocytes: 9.4 % (03/20/17 08:45:00 EDT)    Eosinophils: 1.1 % (03/20/17 08:45:00 EDT)    Basophils: 0.3 % (03/20/17 08:45:00 EDT)    Immature Granulocytes: 0.3 % (03/20/17 08:45:00 EDT)    NRBC Percent: 0 % (03/20/17 08:45:00 EDT)    Absolute NRBC Count: 0 thous/mm3 (03/20/17 08:45:00 EDT)    UA Color: Yellow1 (03/20/17 18:52:00 EDT)    UA Clarity: Clear1 (03/20/17 18:52:00 EDT)    UA Specific Gravity: 1.015 (03/20/17 18:52:00 EDT)    UA pH: 5 (03/20/17 18:52:00 EDT)    UA Protein: Negative1 (03/20/17 18:52:00 EDT)    UA Glucose: Negative1 (03/20/17 18:52:00 EDT)    UA Ketones: Negative1 (03/20/17 18:52:00 EDT)    UA Bilirubin: Negative1 (03/20/17 18:52:00 EDT)    UA Blood: Negative1 (03/20/17 18:52:00 EDT)    UA Urobilinogen: Negative1 (03/20/17 18:52:00 EDT)    UA Nitrite: Negative1 (03/20/17 18:52:00  EDT)    UA Leukocyte Esterase: Trace1 Abnormal (03/20/17 18:52:00 EDT)    UA RBC: 1 /HPF (03/20/17 18:52:00 EDT)    UA WBC: 7 /HPF High  (03/20/17 18:52:00 EDT)    UA Squamous Epithelial: 1 /HPF (03/20/17 18:52:00 EDT)    UA Trans Epithelial: <1 High (03/20/17 18:52:00 EDT)    UA Bacteria: None1 (03/20/17 18:52:00 EDT)    UA Hyaline Casts: 3 /LPF (03/20/17 18:52:00 EDT)    Diagnostic Results    Impression and Plan    CHF - Congestive heart failure            66 year female with history of        Chronic back pain    Peripheral neuropathy    Scoliosis    Bipolar disorder    Anxiety    Hypertension    COPD        was sent from rehab with complaints of shortness of breath. Patient on arrival  to the ER was hypoxic with oxygen sats in 80's and also required  bipap                Impression    #Acute hypoxic respiratory failure, resolving    #Acute on chronic diastolic congestive heart failure, resolving    #HTN, controlled    #Acute kidney Injury on CKD stage II (baseline Cr 0.9), likely due to  overdiuresis, FeNa 0.4%    #history of gastric bypass    #Anemia , macrocytic s/p 2 units pRBC    #S/p normal EGD 9/15    #COPD, stable    #history of smoking    #Mood disorder    #Ecchymoses, knee abrasions    #chronic pain syndrome    #Deconditioning                Plan    -now doing well on room air    -held lasix yesterday in view of elevated creatinine. Continue to hold as patient is orthostatic and appears dry on exam this morning. Calculated FeNa 0.4%- will start NS @ 75 cc/hr for 500 total cc today, encourage PO intake, avoid NSAIDs and nephrotoxins. Recheck BMP in AM    -Avoid benzos given history of falls. PT eval; patient likely to be discharged back to rehab when medically stable    -Appreciate GI recommendations- normal EGD; outpatient f/u with primary GI recommended     -supportive care, repositioning, skin care, fall risk protocol, pain control, incentive spirometry            DVT ppx: heparin    PPI                FULL CODE [2]        Anticipate discharge in 24-48 hrs    [1] Progress/SOAP Note; Gerrie Nordmann MD 03/19/2017 12:48 EDT    [2]  Progress/SOAP Note; Gerrie Nordmann MD 03/19/2017 12:48 EDT    SIGNATURE LINE Electronically signed by Allena Katz MD, Zailynn Brandel on 03/20/2017 at  19:53:37 EST

## 2017-03-15 NOTE — H&P (Signed)
Chief Complaint    pneumonia, CHF    History of Present Illness    40F COPD, HTN, Dep/anx, BPD, and GERD who was sent to the ER for hypoxia. The  patient was d/c'd yesterday after 9/7-10 admission for AKI, hyperkalemia,  bradycardia. During admission, she rec'd 7 liters IVF. Yesterday, she was  d/c'd to [facility] and then sent back to [facility] for O2 sat 80% on RA. Upon  arrival to ER, she was 75% on RA and placed on bipap. She also had a temp  100.1. She rec'd vanco/zosyn, dilaudid, morphine, oxycodone, and lasix 40 IV  with 1500cc uop. She notes 3 days of of bilateral lower chest/upper abdomen,  worse with eating and worse with breathing "like a gallbladder attack". no  BRBPR/melena. mild nausea, but no emesis. no diarrhea. She notes worsening  breathing, edema, abdominal distention over the last couple of days. Breathing  worse supine. CP only with deep breaths. New cough with mild production.  Denies any UTI sx, dysphagia.    Review of Systems    The remainder of the ROS is neg except as described above    Code Status    FULL    Physical Exam    Vitals & Measurements    **T:** 97.6 F (Temporal Artery) **TMIN:** 97.6 F (Temporal Artery) **TMAX:**  100.1 F (Oral) **HR:** 87 **RR:** 18 **BP:** 133/86 **SpO2:** 97% **O2  Rate:** 5 **O2 Method (L/min):** BiPAP **WT:** 83 Kg    GEN: A+Ox3 comfortable speaking with BIPAP on    HEENT: bipap in place    NECK: unable to eval JVP    LUNGS: decreased BS bilateral bases with crackles 1/2 way up bil. good air  movt in apices. no wheeze or rhonchi. no acc mm use, but on bipap.    CV: RRR no m/r/g    ABD: distended, NT +BS    EXT: B hand edema, mild edema in BLE    NEURO: limited exam but moves all ext and follows all commands    Impression and Plan    67F HTN, COPD, BPD, dep/anx, GERD and just d/c'd yesterday from [facility] who p/w  hypoxia.        1\. Acute hypoxic respiratory failure: due to acute CHF (03/13/17 echo EF 65  with nl diastolic fxn) from fluid overload and HCAP.  does not appear related  to COPD.    -continue BIPAP    -aggressive diureses    -foley in place    -strict I/Os    -vanco/zosyn    -send cultures and urine Ag's    -nebs, but no steroids        2\. Elevated troponin: 2/2 CHF and demand related ischemia in setting of  infection and hypoxia. not ACS.    -ASA        3\. Acute pain: 3 days of bilateral lower chest/upper abdomen, worse with  eating and worse with breathing. most likely due to developing pneumonia.  denies BRBPR/melena. labs and CT and US unremarkable.    -oxycodone    -morphine    -PPI    -consider GI cocktail        4\. Anemia: improved over the last few days and was seen by hematology  yesterday, follow        5\. CODE: FULL (pt previously was dnr/dni, but wishes to reverse this)    CMS Two-Midnight Rule    I certify that hospital inpatient services are reasonable and medically  necessary. They  are appropriately provided as inpatient services in accordance  with the two midnight benchmark under 42CFR 412 3(e), or the services are  specified as inpatient only procedure under 42 CFR 419 22(n).          Problem List/Past Medical History    COPD    s/p Roux-en-Y gastric bypass    HTN    Dep/anx    BPD    GERD      Social History    quit smoking 6 months ago    no ETOH or drugs    Family History    High blood pressure: Father, Sister and Brother.    Allergies    NKA    Medications    _Inpatient_    acetaminophen tablet, 650 mg= 2 tab(s), PO, q4hr, PRN    Al hydroxide/Mg hydroxide/simethicone, 30 mL, PO, q4hr, PRN    albuterol 2.5 mg/3 mL (0.083%) inhalation solution, 2.5 mg= 3 mL, NEB, q4hr,  PRN    amitriptyline, 50 mg= 1 tab(s), PO, HS    amLODIPine, 5 mg= 1 tab(s), PO, Once    atenolol, 50 mg= 1 tab(s), PO, BID    bisacodyl, 10 mg= 1 supp, PR, Daily, PRN    budesonide 0.5 mg/2 mL inhalation suspension, 0.5 mg= 2 mL, NEB, BID    clonazePAM, 1 mg= 1 tab(s), PO, BID    Culturelle Digestive [employer] oral capsule, 1 cap(s), PO, Daily    Cymbalta, 60 mg= 2  cap(s), PO, Daily    docusate sodium, 100 mg= 1 cap(s), PO, BID    DuoNeb, 3 mL, NEB, q6hr    enoxaparin, 40 mg= 0.4 mL, sc, q24hr    ferrous sulfate, 325 mg= 1 tab(s), PO, BID    formoterol 20 mcg/2 mL inhalation solution, 20 mcg= 2 mL, NEB, BID    Lasix, 40 mg= 4 mL, IV Push, BID    Latuda, 120 mg= 6 tab(s), PO, HS    Maalox, 30 mL, PO, q4hr, PRN    morPHINE, 2 mg, IV Push, q4hr, PRN    Mucinex, 600 mg= 1 tab(s), PO, q12hr    ondansetron, 4 mg= 2 mL, IV Push, q8hr, PRN    oxyCODONE, 10 mg= 2 tab(s), PO, q6hr, PRN    pantoprazole, 40 mg= 1 tab(s), PO, BID    Robitussin, 100 mg= 5 mL, PO, q4hr, PRN    vancomycin    Zanaflex, 4 mg= 1 tab(s), PO, QID, PRN    Zosyn    _Home_    aluminum hydroxide/magnesium hydroxide/simethicone 200 mg-200 mg-20 mg/5 mL  oral suspension, 30 mL, PO, q4hr, PRN    amitriptyline, 50 mg, PO, HS    amLODIPine, 5 mg, PO, Once    atenolol, 50 mg, PO, BID    Breo Ellipta 100 mcg-25 mcg/inh inhalation powder, 1 puff(s), INHAL, Daily    clonazePAM 1 mg oral tablet, 1 mg, PO, BID    Cymbalta 60 mg oral delayed release capsule, 60 mg= 1 cap(s), PO, Daily    ferrous gluconate 325 mg (36 mg elemental iron) oral tablet, 325 mg= 1 tab(s),  PO, BID    lactobacillus rhamnosus GG oral capsule, 1 cap(s), PO, Daily    Lasix, 40 mg, PO, Daily    Lasix 20 mg oral tablet, 20 mg= 1 tab(s), PO, Daily    Latuda, 120 mg, PO, HS    ondansetron 2 mg/mL injectable solution, 4 mg= 2 mL, IV Push, q8hr, PRN    oxyCODONE 10 mg oral tablet, 10  mg= 1 tab(s), PO, BID    pantoprazole 40 mg oral delayed release tablet, 40 mg= 1 tab(s), PO, BID    Spiriva 18 mcg inhalation capsule, 18 mcg= 1 cap(s), INHAL, Daily    sucralfate 1 g oral tablet, 1 Gm= 1 tab(s), PO, QID    tiZANidine 4 mg oral tablet, 4 mg= 1 tab(s), PO, QID, PRN    Vitamin B12 250 mcg oral tablet, 250 mcg= 1 tab(s), PO, Daily    Vitamin D3, 1000 Unit(s), PO, Daily    Lab Results    Glucose Lvl: 126 mg/dL High (91/47/82 95:62:13 EDT)    BUN: 11 mg/dL (08/65/78  46:96:29 EDT)    Creatinine: 1.01 mg/dL (52/84/13 24:40:10 EDT)    Sodium Lvl: 136 mmol/L (03/14/17 21:31:00 EDT)    Potassium Lvl: 3.8 mmol/L (03/14/17 21:31:00 EDT)    Chloride: 102 mmol/L (03/14/17 21:31:00 EDT)    CO2: 26 mmol/L (03/14/17 21:31:00 EDT)    Anion Gap: 8 (03/14/17 21:31:00 EDT)    Total Protein: 7 Gm/dL (27/25/36 64:40:34 EDT)    Albumin Lvl: 2.9 Gm/dL Low (74/25/95 63:87:56 EDT)    Calcium Lvl: 8.2 mg/dL Low (43/32/95 18:84:16 EDT)    Bilirubin Total: 1.6 mg/dL High (60/63/01 60:10:93 EDT)    Alkaline Phosphatase: 108 Units/L (03/14/17 21:31:00 EDT)    AST: 19 Units/L (03/14/17 21:31:00 EDT)    ALT: 15 Units/L (03/14/17 21:31:00 EDT)    Lipase Lvl: 65 Units/L Low (03/14/17 21:31:00 EDT)    Lactic Acid Lvl: 1.6 mmol/L (03/14/17 21:31:00 EDT)    Troponin I: 0.227 nGm/ml High (03/15/17 02:19:00 EDT)    NT-ProBNP: 23557 pGm/ml High (03/14/17 21:31:00 EDT)    WBC: 15 thous/mm3 High (03/14/17 21:31:00 EDT)    RBC: 2.36 Mil/mm3 Low (03/14/17 21:31:00 EDT)    Hgb: 8.5 Gm/dL Low (32/20/25 42:70:62 EDT)    Hct: 25.8 % Low (03/14/17 21:31:00 EDT)    Platelet: 279 thous/mm3 (03/14/17 21:31:00 EDT)    MCV: 109.3 fL High (03/14/17 21:31:00 EDT)    MCH: 36 pGm High (03/14/17 21:31:00 EDT)    MCHC: 32.9 Gm/dL (37/62/83 15:17:61 EDT)    RDW-SD: 64.7 fL High (03/14/17 21:31:00 EDT)    MPV: 8.5 fL Low (03/14/17 21:31:00 EDT)    Absolute Neutro Count: 12.49 thous/mm3 High (03/14/17 21:31:00 EDT)    Absolute Lymphs Count: 0.82 thous/mm3 (03/14/17 21:31:00 EDT)    Absolute Mono Count: 1.58 thous/mm3 High (03/14/17 21:31:00 EDT)    Absolute Eos Count: 0.01 thous/mm3 (03/14/17 21:31:00 EDT)    Absolute Baso Count: 0.02 thous/mm3 (03/14/17 21:31:00 EDT)    Neutrophils: 83.1 % (03/14/17 21:31:00 EDT)    Lymphocytes: 5.5 % (03/14/17 21:31:00 EDT)    Monocytes: 10.5 % (03/14/17 21:31:00 EDT)    Eosinophils: 0.1 % (03/14/17 21:31:00 EDT)    Basophils: 0.1 % (03/14/17 21:31:00 EDT)    Immature Granulocytes: 0.7 % (03/14/17  21:31:00 EDT)    NRBC Percent: 0.3 % High (03/14/17 21:31:00 EDT)    Absolute NRBC Count: 0.04 thous/mm3 (03/14/17 21:31:00 EDT)    Blue Top Tube To Hold: DONE (03/14/17 21:31:00 EDT)    UA Color: Yellow1 (03/14/17 22:58:00 EDT)    UA Clarity: Clear1 (03/14/17 22:58:00 EDT)    UA Specific Gravity: 1.009 (03/14/17 22:58:00 EDT)    UA pH: 6 (03/14/17 22:58:00 EDT)    UA Protein: Negative1 (03/14/17 22:58:00 EDT)    UA Glucose: Negative1 (03/14/17 22:58:00 EDT)    UA Ketones: Trace1 Abnormal (03/14/17 22:58:00 EDT)    UA Bilirubin: Negative1 (  03/14/17 22:58:00 EDT)    UA Blood: Negative1 (03/14/17 22:58:00 EDT)    UA Urobilinogen: Negative1 (03/14/17 22:58:00 EDT)    UA Nitrite: Negative1 (03/14/17 22:58:00 EDT)    UA Leukocyte Esterase: Negative1 (03/14/17 22:58:00 EDT)    UA RBC: 1 /HPF (03/14/17 22:58:00 EDT)    UA WBC: <1 (03/14/17 22:58:00 EDT)    UA Squamous Epithelial: <1 (03/14/17 22:58:00 EDT)    UA Bacteria: Trace1 Abnormal (03/14/17 22:58:00 EDT)    Specimen Site: RR (03/15/17 03:16:00 EDT)    FiO2 Conc: 45 (03/15/17 11:06:00 EDT)    Comment (Blood Gas): bi17/6/45 (03/15/17 03:16:00 EDT)    pH Arterial: 7.51 High (03/15/17 03:16:00 EDT)    pCO2 Arterial: 37.3 mmHg (03/15/17 03:16:00 EDT)    pO2 Arterial: 71.1 mmHg Low (03/15/17 03:16:00 EDT)    HCO3 Arterial: 30.3 mmol/L High (03/15/17 03:16:00 EDT)    Base Excess Arterial: 6.9 mmol/L High (03/15/17 03:16:00 EDT)    O2 Sat Arterial: 96.4 % (03/15/17 03:16:00 EDT)    Diagnostic Results        Procedure: XR Chest Single View 03/14/2017 9:53 PM    Indications: Other (Free text in Reason for Exam) - Dyspnea    Comparison: 03/11/2017        Chest, PA and lateral views        FINDINGS: There are diffuse patchy alveolar airspace disease in the    bilateral lung fields, more pronounced on the right side. Findings have    progressed from 3 days ago.. Cardiomediastinal contours are unchanged and    enlarged. Multiple right-sided rib fracture deformities are again  noted,  chronic    in nature. No perceptible pneumothorax or mediastinal shift.        IMPRESSION:    1\. Worsening airspace disease from 3 days ago, more pronounced on the right    side, compatible with pulmonary edema, atelectasis, ARDS, developing    consolidation other etiologies, depending upon clinical scenario. Short  interval    radiographic follow-up is recommended to assess interval change or clearance.    2\. Unchanged thyromegaly.    [1]            Procedure: US Abdomen Complete 03/14/2017 11:55 PM    Indications: Abdominal Pain    Comparison: CT abdomen previous day        TECHNIQUE: Multiple longitudinal and transverse 2D real time ultrasound images    through the abdomen were acquired. Examination is limited by patient  breathing.        FINDINGS:    Liver: Normal echotexture and normal echogenicity. Although partially obscured    by shadowing, there are no suspicious lesions were visualized.    Gallbladder: The gallbladder is surgically absent.    CBD: 1.0 cm in diameter, within normal limits for a postcholecystectomy  patient.    Pancreas: Visualized portions are normal.    Spleen: 12.4 cm in sagittal dimension, upper limits normal.    Right Kidney: 9.7 cm in length. No renal mass, hydronephrosis, or renal  calculi    identified.    Left Kidney: 10.1 cm in length. No renal mass, hydronephrosis, or renal  calculi    identified.    Abdominal aorta: No aneurysm identified.    Proximal IVC: Visualized portions are normal.    Ascites: None.    Spectral doppler analysis: Complete Spectral Doppler examination was not    performed. The main portal vein is widely patent with hepatopetal flow.    Small bilateral pleural  effusions are again noted.        IMPRESSION:    1\. No acute abdominal findings.    2\. Small bilateral pleural effusions are again noted.        [2]        Consider further evaluation with nonemergent fluoroscopic upper    gastrointestinal study after the patient's barium contrast has  cleared.    [3]            ECGREPT    Sinus rhythm    ST-T changes are nonspecific, improved over prior tracing        Borderline ECG        PREVIOUS TRACING: 03/12/2017 07.22    Reconfirmed for order attachment    Physician Interpreter: [id number] Micheal Likens , M.D.    ECG HEART RATE: 96 /min    ECG RR INTERVAL: 625 ms    ECG P DURATION: 118 ms    ECG QRS DURATION: 90 ms    ECG PR INTERVAL: 170 ms    ECG QT INTERVAL: 390 ms    ECG QTC INTERVAL: 453 ms    ECG QT DISPERSION: 44 ms    ECG P AXIS: 48 deg    ECG QRS AXIS: 5 deg    ECG T AXIS: 29 deg        [4]        ------        [1] XR Chest Single View; Oneida Alar MD, Debroah Loop 03/14/2017 22:16 EDT    [2] US Abdomen Complete; Judd Gaudier MD 03/14/2017 23:55 EDT    [3] CT Abdomen and Pelvis C-; Dobre MD, Judie Petit Cristian 03/13/2017 16:20 EDT    [4] EKG; Ulyses Southward MD, Cliffton Asters 03/14/2017 21:18 EDT    SIGNATURE LINE Electronically signed by Marquita Palms MD, Andie Mungin on 03/15/2017 at  15:17:55 EST

## 2017-03-15 NOTE — Procedures (Signed)
__________________________________________________________________________    ENDOSCOPY PROCEDURE NOTE  DATE:  03/19/2017    REFERRING PHYSICIAN:  Hospitalist Service.     ENDOSCOPIST:  Christella Noa, M.D.    PROCEDURE:  Esophagogastroduodenoscopy.  Cardiac monitor and oximeter used.    MEDICATIONS:  As per Anesthesia.    Preprocedure physical examination was stable.  Procedure was explained to the  patient and informed consent obtained.    INDICATIONS:  The patient is a 66 year old female status post gastric bypass who  presents with abdominal pain and mild anemia.  She has a history of esophageal  ulcerations.    FINDINGS:  The patient was placed in the left lateral position.  Sedation was  achieved as per Anesthesia.  The instrument was introduced into the oropharynx  and advanced without difficulty into the esophagus.  The esophageal mucosa  appears normal throughout its length.  There was a small amount of retained  vegetable matter within the esophagus.  The gastric remnant appears normal.  The  anastomosis is normal as well.  There was no evidence of ulcerations, retained  sutures or staples.  The efferent and afferent limbs were all normal without any  mucosal breaks.  There was no evidence of fresh or old blood anywhere within the  upper GI tract.    IMPRESSION:  Normal postop anatomy.  No evidence of active or old bleeding.    RECOMMENDATIONS:  The patient's diet can be advanced.  If she remains clinically  stable, discharge planning can be initiated and she can follow up with her  primary GI doctor as an outpatient.    Dictated by:  Grace Bushy, M.D.    D:  03/19/2017 11:01:07  T:  03/21/2017 10:32:33  E:  03/21/2017 10:32:33  DFB/tam  Job# [id number]   SIGNATURE LINE    Electronically signed by Gerre Pebbles MD, Margarette Asal on 03/21/2017 at 12:18:35 EST

## 2017-03-16 LAB — HX BASIC METABOLIC PANEL
CASE NUMBER: 2018255000275
CASE NUMBER: 2018255000377
HX ANION GAP: 7 — NL (ref 3.0–11.0)
HX ANION GAP: 6 — NL (ref 3.0–11.0)
HX BUN: 12 mg/dL — NL (ref 7.0–18.0)
HX BUN: 13 mg/dL — NL (ref 7.0–18.0)
HX CALCIUM LVL: 7.6 mg/dL — ABNORMAL LOW (ref 8.5–10.1)
HX CALCIUM LVL: 7.7 mg/dL — ABNORMAL LOW (ref 8.5–10.1)
HX CHLORIDE: 96 mmol/L — ABNORMAL LOW (ref 98.0–110.0)
HX CHLORIDE: 99 mmol/L — NL (ref 98.0–110.0)
HX CO2: 32 mmol/L — NL (ref 21.0–32.0)
HX CO2: 32 mmol/L — NL (ref 21.0–32.0)
HX CREATININE: 1.08 mg/dL — NL (ref 0.55–1.3)
HX CREATININE: 0.968 mg/dL — NL (ref 0.55–1.3)
HX GLUCOSE LVL: 112 mg/dL — ABNORMAL HIGH (ref 65.0–110.0)
HX GLUCOSE LVL: 96 mg/dL — NL (ref 65.0–110.0)
HX POTASSIUM LVL: 3.4 mmol/L — ABNORMAL LOW (ref 3.6–5.2)
HX POTASSIUM LVL: 3.1 mmol/L — ABNORMAL LOW (ref 3.6–5.2)
HX SODIUM LVL: 135 mmol/L — ABNORMAL LOW (ref 136.0–145.0)
HX SODIUM LVL: 137 mmol/L — NL (ref 136.0–145.0)

## 2017-03-16 LAB — HX CBC W/ INDICES
CASE NUMBER: 2018255000275
CASE NUMBER: 2018255000377
HX ABSOLUTE NRBC COUNT: 0.02 10*3/uL
HX ABSOLUTE NRBC COUNT: 0 10*3/uL
HX HCT: 21.3 % — CR (ref 36.0–47.0)
HX HCT: 23.7 % — ABNORMAL LOW (ref 36.0–47.0)
HX HGB: 6.9 g/dL — CR (ref 11.8–16.0)
HX HGB: 7.9 g/dL — ABNORMAL LOW (ref 11.8–16.0)
HX MCH: 36.1 pg — ABNORMAL HIGH (ref 26.0–34.0)
HX MCH: 34.1 pg — ABNORMAL HIGH (ref 26.0–34.0)
HX MCHC: 32.4 g/dL — NL (ref 31.0–37.0)
HX MCHC: 33.3 g/dL — NL (ref 31.0–37.0)
HX MCV: 111.5 fL — ABNORMAL HIGH (ref 80.0–100.0)
HX MCV: 102.2 fL — ABNORMAL HIGH (ref 80.0–100.0)
HX MPV: 8.7 fL — ABNORMAL LOW (ref 9.4–12.4)
HX MPV: 8.4 fL — ABNORMAL LOW (ref 9.4–12.4)
HX NRBC PERCENT: 0.3 % — ABNORMAL HIGH
HX NRBC PERCENT: 0 % — NL
HX PLATELET: 217 10*3/uL — NL (ref 150.0–400.0)
HX PLATELET: 179 10*3/uL — NL (ref 150.0–400.0)
HX RBC: 1.91 10*6/uL — ABNORMAL LOW (ref 3.9–5.2)
HX RBC: 2.32 10*6/uL — ABNORMAL LOW (ref 3.9–5.2)
HX RDW-CV: 17 % — ABNORMAL HIGH (ref 11.5–14.5)
HX RDW-CV: 21.2 % — ABNORMAL HIGH (ref 11.5–14.5)
HX RDW-SD: 68.2 fL — ABNORMAL HIGH (ref 35.0–51.0)
HX RDW-SD: 77.9 fL — ABNORMAL HIGH (ref 35.0–51.0)
HX WBC: 6.9 10*3/uL — NL (ref 3.7–11.2)
HX WBC: 7.1 10*3/uL — NL (ref 3.7–11.2)

## 2017-03-16 LAB — HX HEPATIC FUNCTION PANEL
CASE NUMBER: 16923185
CASE NUMBER: 2018255000377
HX ALBUMIN LVL: 2.3 g/dL — ABNORMAL LOW (ref 3.5–5.0)
HX ALBUMIN LVL: 2.3 g/dL — ABNORMAL LOW (ref 3.5–5.0)
HX ALKALINE PHOSPHATASE: 84 U/L — NL (ref 45.0–117.0)
HX ALKALINE PHOSPHATASE: 86 U/L — NL (ref 45.0–117.0)
HX ALT: 10 U/L — NL (ref 6.0–78.0)
HX ALT: 11 U/L — NL (ref 6.0–78.0)
HX AST: 19 U/L — NL (ref 6.0–40.0)
HX AST: 10 U/L — NL (ref 6.0–40.0)
HX BILIRUBIN DIRECT: 0.5 mg/dL — ABNORMAL HIGH (ref 0.0–0.3)
HX BILIRUBIN DIRECT: 0.5 mg/dL — ABNORMAL HIGH (ref 0.0–0.3)
HX BILIRUBIN TOTAL: 1.3 mg/dL — ABNORMAL HIGH (ref 0.2–1.2)
HX BILIRUBIN TOTAL: 1 mg/dL — NL (ref 0.2–1.2)
HX TOTAL PROTEIN: 5.6 g/dL — ABNORMAL LOW (ref 6.0–8.0)
HX TOTAL PROTEIN: 6 g/dL — NL (ref 6.0–8.0)

## 2017-03-16 LAB — HX ZZANTIBODY SCREEN
CASE NUMBER: 2018255000275
HX ANTIBODY SCREEN: NEGATIVE — NL
HX G1: 0 — NL
HX G2: 0 — NL
HX G3: 0 — NL

## 2017-03-16 LAB — HX .ABO/RH TYPE TUBE
CASE NUMBER: 2018255000275
HX A1: 0 — NL
HX ABORH: A POS — NL
HX ANTI-B: 0 — NL

## 2017-03-16 LAB — HX MRSA CULTURE: CASE NUMBER: 2018254001302

## 2017-03-16 LAB — HX LAVENDER TOP TO HOLD: CASE NUMBER: 2018255000038

## 2017-03-16 LAB — HX C DIFFICILE TOXIN B BY PCR
CASE NUMBER: 2018255001850
HX C DIFF 027 BY PCR: NEGATIVE
HX C DIFFICILE TOXIN B BY PCR: NOT DETECTED

## 2017-03-16 LAB — HX PTT
CASE NUMBER: 2018255000377
HX PTT: 28.7 s — NL (ref 23.4–32.1)

## 2017-03-16 LAB — HX GLOMERULAR FILTRATION RATE (ESTIMATED)
CASE NUMBER: 2018255000275
HX AFN AMER GLOMERULAR FILTRATION RATE: 62 mL/min/{1.73_m2}
HX NON-AFN AMER GLOMERULAR FILTRATION RATE: 54 mL/min/{1.73_m2}

## 2017-03-16 LAB — HX VANCOMYCIN LEVEL TROUGH
CASE NUMBER: 2018255000038
HX VANCOMYCIN TROUGH: 25.7 ug/mL — CR (ref 10.0–20.0)

## 2017-03-16 LAB — HX .REDCELL
CASE NUMBER: 2018255000304
HX ADDITIONAL UNITS TO CROSSMATCH: 0 — NL
HX NUMBER OF UNITS TO TRANSFUSE: 1

## 2017-03-16 LAB — HX PT
CASE NUMBER: 2018255000377
HX INR: 1.2
HX PT: 11.7 s — ABNORMAL HIGH (ref 9.3–11.6)

## 2017-03-16 LAB — HX ALBUMIN LEVEL
CASE NUMBER: 16923201
HX ALBUMIN LVL: 2.3 g/dL — ABNORMAL LOW (ref 3.5–5.0)

## 2017-03-16 LAB — HX LACTIC ACID
CASE NUMBER: 2018255000275
HX LACTIC ACID LVL: 0.9 mmol/L — NL (ref 0.4–2.0)

## 2017-03-17 LAB — HX MAGNESIUM LEVEL
CASE NUMBER: 2018256000343
HX MAGNESIUM LVL: 1.6 mg/dL — ABNORMAL LOW (ref 1.8–2.4)

## 2017-03-17 LAB — HX BASIC METABOLIC PANEL
CASE NUMBER: 2018256000343
HX ANION GAP: 8 — NL (ref 3.0–11.0)
HX BUN: 15 mg/dL — NL (ref 7.0–18.0)
HX CALCIUM LVL: 7.7 mg/dL — ABNORMAL LOW (ref 8.5–10.1)
HX CHLORIDE: 101 mmol/L — NL (ref 98.0–110.0)
HX CO2: 32 mmol/L — NL (ref 21.0–32.0)
HX CREATININE: 1.24 mg/dL — NL (ref 0.55–1.3)
HX GLUCOSE LVL: 85 mg/dL — NL (ref 65.0–110.0)
HX POTASSIUM LVL: 3.5 mmol/L — ABNORMAL LOW (ref 3.6–5.2)
HX SODIUM LVL: 141 mmol/L — NL (ref 136.0–145.0)

## 2017-03-17 LAB — HX CBC W/ INDICES
CASE NUMBER: 2018256000343
HX ABSOLUTE NRBC COUNT: 0 10*3/uL
HX HCT: 23.7 % — ABNORMAL LOW (ref 36.0–47.0)
HX HGB: 7.8 g/dL — ABNORMAL LOW (ref 11.8–16.0)
HX MCH: 34.2 pg — ABNORMAL HIGH (ref 26.0–34.0)
HX MCHC: 32.9 g/dL — NL (ref 31.0–37.0)
HX MCV: 103.9 fL — ABNORMAL HIGH (ref 80.0–100.0)
HX MPV: 8.8 fL — ABNORMAL LOW (ref 9.4–12.4)
HX NRBC PERCENT: 0 % — NL
HX PLATELET: 230 10*3/uL — NL (ref 150.0–400.0)
HX RBC: 2.28 10*6/uL — ABNORMAL LOW (ref 3.9–5.2)
HX RDW-CV: 21 % — ABNORMAL HIGH (ref 11.5–14.5)
HX RDW-SD: 78.3 fL — ABNORMAL HIGH (ref 35.0–51.0)
HX WBC: 5.9 10*3/uL — NL (ref 3.7–11.2)

## 2017-03-17 LAB — HX GLOMERULAR FILTRATION RATE (ESTIMATED)
CASE NUMBER: 2018256000343
HX AFN AMER GLOMERULAR FILTRATION RATE: 53 mL/min/{1.73_m2}
HX NON-AFN AMER GLOMERULAR FILTRATION RATE: 46 mL/min/{1.73_m2}

## 2017-03-17 LAB — HX .REDCELL
CASE NUMBER: 2018256000821
HX ADDITIONAL UNITS TO CROSSMATCH: 0 — NL
HX NUMBER OF UNITS TO TRANSFUSE: 1

## 2017-03-17 LAB — HX CROSSMATCH: CASE NUMBER: 2018255000275

## 2017-03-18 LAB — HX CBC W/ INDICES
CASE NUMBER: 2018257000274
HX ABSOLUTE NRBC COUNT: 0 10*3/uL
HX HCT: 28.8 % — ABNORMAL LOW (ref 36.0–47.0)
HX HGB: 9.6 g/dL — ABNORMAL LOW (ref 11.8–16.0)
HX MCH: 33.8 pg — NL (ref 26.0–34.0)
HX MCHC: 33.3 g/dL — NL (ref 31.0–37.0)
HX MCV: 101.4 fL — ABNORMAL HIGH (ref 80.0–100.0)
HX MPV: 8.6 fL — ABNORMAL LOW (ref 9.4–12.4)
HX NRBC PERCENT: 0 % — NL
HX PLATELET: 249 10*3/uL — NL (ref 150.0–400.0)
HX RBC: 2.84 10*6/uL — ABNORMAL LOW (ref 3.9–5.2)
HX RDW-CV: 21.8 % — ABNORMAL HIGH (ref 11.5–14.5)
HX RDW-SD: 80.6 fL — ABNORMAL HIGH (ref 35.0–51.0)
HX WBC: 5.3 10*3/uL — NL (ref 3.7–11.2)

## 2017-03-18 LAB — HX BLOOD CULTURE
CASE NUMBER: 2018253002972
CASE NUMBER: 2018253002973
HX F: NO GROWTH
HX F: NO GROWTH

## 2017-03-18 LAB — HX GLOMERULAR FILTRATION RATE (ESTIMATED)
CASE NUMBER: 2018257000274
HX AFN AMER GLOMERULAR FILTRATION RATE: 36 mL/min/{1.73_m2}
HX NON-AFN AMER GLOMERULAR FILTRATION RATE: 31 mL/min/{1.73_m2}

## 2017-03-18 LAB — HX BASIC METABOLIC PANEL
CASE NUMBER: 2018257000274
HX ANION GAP: 6 — NL (ref 3.0–11.0)
HX BUN: 18 mg/dL — NL (ref 7.0–18.0)
HX CALCIUM LVL: 8.2 mg/dL — ABNORMAL LOW (ref 8.5–10.1)
HX CHLORIDE: 100 mmol/L — NL (ref 98.0–110.0)
HX CO2: 33 mmol/L — ABNORMAL HIGH (ref 21.0–32.0)
HX CREATININE: 1.69 mg/dL — ABNORMAL HIGH (ref 0.55–1.3)
HX GLUCOSE LVL: 90 mg/dL — NL (ref 65.0–110.0)
HX POTASSIUM LVL: 3.8 mmol/L — NL (ref 3.6–5.2)
HX SODIUM LVL: 139 mmol/L — NL (ref 136.0–145.0)

## 2017-03-19 LAB — HX CBC W/ INDICES
CASE NUMBER: 2018258000252
HX ABSOLUTE NRBC COUNT: 0 10*3/uL
HX HCT: 31.7 % — ABNORMAL LOW (ref 36.0–47.0)
HX HGB: 10.5 g/dL — ABNORMAL LOW (ref 11.8–16.0)
HX MCH: 33.3 pg — NL (ref 26.0–34.0)
HX MCHC: 33.1 g/dL — NL (ref 31.0–37.0)
HX MCV: 100.6 fL — ABNORMAL HIGH (ref 80.0–100.0)
HX MPV: 9.7 fL — NL (ref 9.4–12.4)
HX NRBC PERCENT: 0 % — NL
HX PLATELET: 235 10*3/uL — NL (ref 150.0–400.0)
HX RBC: 3.15 10*6/uL — ABNORMAL LOW (ref 3.9–5.2)
HX RDW-CV: 20.3 % — ABNORMAL HIGH (ref 11.5–14.5)
HX RDW-SD: 73.9 fL — ABNORMAL HIGH (ref 35.0–51.0)
HX WBC: 6.8 10*3/uL — NL (ref 3.7–11.2)

## 2017-03-19 LAB — HX BASIC METABOLIC PANEL
CASE NUMBER: 2018258000252
HX ANION GAP: 9 — NL (ref 3.0–11.0)
HX BUN: 23 mg/dL — ABNORMAL HIGH (ref 7.0–18.0)
HX CALCIUM LVL: 8.7 mg/dL — NL (ref 8.5–10.1)
HX CHLORIDE: 96 mmol/L — ABNORMAL LOW (ref 98.0–110.0)
HX CO2: 33 mmol/L — ABNORMAL HIGH (ref 21.0–32.0)
HX CREATININE: 1.98 mg/dL — ABNORMAL HIGH (ref 0.55–1.3)
HX GLUCOSE LVL: 83 mg/dL — NL (ref 65.0–110.0)
HX POTASSIUM LVL: 3.6 mmol/L — NL (ref 3.6–5.2)
HX SODIUM LVL: 138 mmol/L — NL (ref 136.0–145.0)

## 2017-03-19 LAB — HX GLOMERULAR FILTRATION RATE (ESTIMATED)
CASE NUMBER: 2018258000252
HX AFN AMER GLOMERULAR FILTRATION RATE: 30 mL/min/{1.73_m2}
HX NON-AFN AMER GLOMERULAR FILTRATION RATE: 26 mL/min/{1.73_m2}

## 2017-03-19 LAB — HX MAGNESIUM LEVEL
CASE NUMBER: 2018258000252
HX MAGNESIUM LVL: 2.3 mg/dL — NL (ref 1.8–2.4)

## 2017-03-20 LAB — HX .AUTOMATED DIFF
CASE NUMBER: 2018259000439
HX ABSOLUTE BASO COUNT: 0.02 10*3/uL — NL (ref 0.0–0.22)
HX ABSOLUTE EOS COUNT: 0.08 10*3/uL — NL (ref 0.0–0.45)
HX ABSOLUTE LYMPHS COUNT: 1.01 10*3/uL — NL (ref 0.74–5.04)
HX ABSOLUTE MONO COUNT: 0.67 10*3/uL — NL (ref 0.0–1.34)
HX ABSOLUTE NEUTRO COUNT: 5.33 10*3/uL — NL (ref 1.48–7.95)
HX BASOPHILS: 0.3 %
HX EOSINOPHILS: 1.1 %
HX IMMATURE GRANULOCYTES: 0.3 % — NL (ref 0.0–2.0)
HX LYMPHOCYTES: 14.2 %
HX MONOCYTES: 9.4 %
HX NEUTROPHILS: 74.7 %

## 2017-03-20 LAB — HX URINALYSIS (COMPLETE)
CASE NUMBER: 2018259000537
HX UA BILIRUBIN: NEGATIVE — NL
HX UA BLOOD: NEGATIVE — NL
HX UA GLUCOSE: NEGATIVE — NL
HX UA HYALINE CASTS: 3 /LPF — NL (ref 0.0–4.0)
HX UA KETONES: NEGATIVE — NL
HX UA NITRITE: NEGATIVE — NL
HX UA PH: 5 — NL (ref 5.0–8.0)
HX UA PROTEIN: NEGATIVE — NL
HX UA RBC: 1 /HPF — NL (ref 0.0–2.0)
HX UA SPECIFIC GRAVITY: 1.015 — NL (ref 1.003–1.03)
HX UA SQUAMOUS EPITHELIAL: 1 /HPF — NL (ref 0.0–5.0)
HX UA TRANS EPITHELIAL: <1 — ABNORMAL HIGH
HX UA UROBILINOGEN: NEGATIVE — NL
HX UA WBC: 7 /HPF — ABNORMAL HIGH (ref 0.0–5.0)

## 2017-03-20 LAB — HX CBC W/ DIFF
CASE NUMBER: 2018259000439
HX ABSOLUTE NRBC COUNT: 0 10*3/uL
HX HCT: 29.6 % — ABNORMAL LOW (ref 36.0–47.0)
HX HGB: 9.6 g/dL — ABNORMAL LOW (ref 11.8–16.0)
HX MCH: 32.4 pg — NL (ref 26.0–34.0)
HX MCHC: 32.4 g/dL — NL (ref 31.0–37.0)
HX MCV: 100 fL — NL (ref 80.0–100.0)
HX MPV: 8.9 fL — ABNORMAL LOW (ref 9.4–12.4)
HX NRBC PERCENT: 0 % — NL
HX PLATELET: 294 10*3/uL — NL (ref 150.0–400.0)
HX RBC: 2.96 10*6/uL — ABNORMAL LOW (ref 3.9–5.2)
HX RDW-CV: 19.1 % — ABNORMAL HIGH (ref 11.5–14.5)
HX RDW-SD: 69.6 fL — ABNORMAL HIGH (ref 35.0–51.0)
HX WBC: 7.1 10*3/uL — NL (ref 3.7–11.2)

## 2017-03-20 LAB — HX MAGNESIUM LEVEL
CASE NUMBER: 2018259000144
HX MAGNESIUM LVL: 2.4 mg/dL — NL (ref 1.8–2.4)

## 2017-03-20 LAB — HX CREATININE URINE
CASE NUMBER: 2018259000536
HX U CREAT: 96 mg/dL

## 2017-03-20 LAB — HX GLOMERULAR FILTRATION RATE (ESTIMATED)
CASE NUMBER: 2018259000144
HX AFN AMER GLOMERULAR FILTRATION RATE: 28 mL/min/{1.73_m2}
HX NON-AFN AMER GLOMERULAR FILTRATION RATE: 24 mL/min/{1.73_m2}

## 2017-03-20 LAB — HX BASIC METABOLIC PANEL
CASE NUMBER: 2018259000144
HX ANION GAP: 8 — NL (ref 3.0–11.0)
HX BUN: 31 mg/dL — ABNORMAL HIGH (ref 7.0–18.0)
HX CALCIUM LVL: 8.4 mg/dL — ABNORMAL LOW (ref 8.5–10.1)
HX CHLORIDE: 98 mmol/L — NL (ref 98.0–110.0)
HX CO2: 30 mmol/L — NL (ref 21.0–32.0)
HX CREATININE: 2.08 mg/dL — ABNORMAL HIGH (ref 0.55–1.3)
HX GLUCOSE LVL: 86 mg/dL — NL (ref 65.0–110.0)
HX POTASSIUM LVL: 3.6 mmol/L — NL (ref 3.6–5.2)
HX SODIUM LVL: 136 mmol/L — NL (ref 136.0–145.0)

## 2017-03-20 LAB — HX SODIUM LEVEL URINE
CASE NUMBER: 2018259000536
HX U SODIUM: 25 mmol/L

## 2017-03-20 LAB — HX PHOSPHORUS LEVEL
CASE NUMBER: 2018259000144
HX PHOSPHORUS: 3.5 mg/dL — NL (ref 2.4–4.9)

## 2017-03-21 LAB — HX .AUTOMATED DIFF
CASE NUMBER: 2018260000113
HX ABSOLUTE BASO COUNT: 0.02 10*3/uL — NL (ref 0.0–0.22)
HX ABSOLUTE EOS COUNT: 0.06 10*3/uL — NL (ref 0.0–0.45)
HX ABSOLUTE LYMPHS COUNT: 0.85 10*3/uL — NL (ref 0.74–5.04)
HX ABSOLUTE MONO COUNT: 0.61 10*3/uL — NL (ref 0.0–1.34)
HX ABSOLUTE NEUTRO COUNT: 4.4 10*3/uL — NL (ref 1.48–7.95)
HX BASOPHILS: 0.3 %
HX EOSINOPHILS: 1 %
HX IMMATURE GRANULOCYTES: 0.3 % — NL (ref 0.0–2.0)
HX LYMPHOCYTES: 14.3 %
HX MONOCYTES: 10.2 %
HX NEUTROPHILS: 73.9 %

## 2017-03-21 LAB — HX BASIC METABOLIC PANEL
CASE NUMBER: 2018260000113
HX ANION GAP: 7 — NL (ref 3.0–11.0)
HX BUN: 30 mg/dL — ABNORMAL HIGH (ref 7.0–18.0)
HX CALCIUM LVL: 8 mg/dL — ABNORMAL LOW (ref 8.5–10.1)
HX CHLORIDE: 102 mmol/L — NL (ref 98.0–110.0)
HX CO2: 31 mmol/L — NL (ref 21.0–32.0)
HX CREATININE: 1.87 mg/dL — ABNORMAL HIGH (ref 0.55–1.3)
HX GLUCOSE LVL: 94 mg/dL — NL (ref 65.0–110.0)
HX POTASSIUM LVL: 3.8 mmol/L — NL (ref 3.6–5.2)
HX SODIUM LVL: 140 mmol/L — NL (ref 136.0–145.0)

## 2017-03-21 LAB — HX MAGNESIUM LEVEL
CASE NUMBER: 2018260000113
HX MAGNESIUM LVL: 2.7 mg/dL — ABNORMAL HIGH (ref 1.8–2.4)

## 2017-03-21 LAB — HX GLOMERULAR FILTRATION RATE (ESTIMATED)
CASE NUMBER: 2018260000113
HX AFN AMER GLOMERULAR FILTRATION RATE: 32 mL/min/{1.73_m2}
HX NON-AFN AMER GLOMERULAR FILTRATION RATE: 28 mL/min/{1.73_m2}

## 2017-03-21 LAB — HX CBC W/ DIFF
CASE NUMBER: 2018260000113
HX ABSOLUTE NRBC COUNT: 0 10*3/uL
HX HCT: 26.6 % — ABNORMAL LOW (ref 36.0–47.0)
HX HGB: 8.9 g/dL — ABNORMAL LOW (ref 11.8–16.0)
HX MCH: 33.5 pg — NL (ref 26.0–34.0)
HX MCHC: 33.5 g/dL — NL (ref 31.0–37.0)
HX MCV: 100 fL — NL (ref 80.0–100.0)
HX MPV: 8.6 fL — ABNORMAL LOW (ref 9.4–12.4)
HX NRBC PERCENT: 0 % — NL
HX PLATELET: 242 10*3/uL — NL (ref 150.0–400.0)
HX RBC: 2.66 10*6/uL — ABNORMAL LOW (ref 3.9–5.2)
HX RDW-CV: 18.6 % — ABNORMAL HIGH (ref 11.5–14.5)
HX RDW-SD: 67.6 fL — ABNORMAL HIGH (ref 35.0–51.0)
HX WBC: 6 10*3/uL — NL (ref 3.7–11.2)

## 2017-03-21 LAB — HX PHOSPHORUS LEVEL
CASE NUMBER: 2018260000113
HX PHOSPHORUS: 3.3 mg/dL — NL (ref 2.4–4.9)

## 2017-03-21 LAB — HX BLUE TOP TO HOLD: CASE NUMBER: 2018260000113

## 2017-03-29 NOTE — Progress Notes (Signed)
* * *    Leah [name], Leah [name] **DOB:** Sep 14, 1950 (66 yo F) **Acc No.** [id number] **DOS:**  03/29/2017    ---        Leah [name]**    ------    55 Y old Female, DOB: August 03, 1950    Account Number: [id number]    [address], [address], [place], [id number]    Home: [phone]    Guarantor: [name], Leah Insurance: [employer] Payer ID: [id number]    Appointment Facility: Nolon Bussing DO        * * *    03/29/2017    Progress Note: [name], D.O.     ------    ---        **Current Medications**    ---    Taking     * ProAir HFA 108 (90 Base) MCG/ACT Aerosol Solution 2 puffs as needed Inhalation every 6 hrs     ---    * Amitriptyline HCl 50 MG Tablet 1 tablet Orally Once a day     ---    * hydrOXYzine HCl 25 MG Tablet 1 tablet as needed Orally every 8 hrs     ---    * Lisinopril 40 MG Tablet 1 tablet Orally Once a day     ---    * Omeprazole 20 MG Capsule Delayed Release TAKE ONE CAPSULE BY MOUTH TWICE A DAY     ---    * tiZANidine HCl 4 MG Tablet 1 tablet as needed Orally Three to four times a day     ---    * Amitriptyline HCl 50 mg Amitriptyline HCI TAKE 1 TABLET BY MOUTH EVERY DAY Orally Once a day     ---    * KlonoPIN 1 MG Tablet TAKE 1 TABLET BY MOUTH 3 TIMES A DAY FOR 14 DAYS Orally two times a day     ---    * Ferrous Gluconate 325 (36 Fe) MG Tablet 1 tablet Orally Twice a day     ---    * Vitamin D 2000 UNIT Capsule 1 tablet Orally Once a day     ---    * oxyCODONE HCl 10 MG Tablet 1 tablet Orally Twice a day     ---    * Latuda 120 MG Tablet 1 tablet Orally Once a day     ---    * Atenolol 50 MG Tablet 1 tablet Orally Twice a day     ---    * clonazePAM 1 MG Tablet TAKE 1 TABLET BY MOUTH THREE TIMES A DAY FOR 14 DAYS THEN TWICE A DAY Orally Once a day     ---    * Furosemide 40 MG Tablet 1 tablet Orally Once a day , Notes to Pharmacist: Mon, Wed,Fri 40 mg 20mg  other days     ---    Not-Taking    * Abilify 15 MG Tablet 1 TAB Orally daily     ---    * Pravastatin Sodium  40MG  1 TAB ORAL daily     ---    * Omeprazole 20MG  Tablet Delayed Release 1 TAB ORAL twice daily     ---    * Albuterol Sulfate (2.5 MG/3ML) 0.083% Nebulization Solution 3 ml Inhalation Three times a day     ---    * Advair Diskus 500-50 MCG/DOSE Aerosol Powder Breath Activated INHALE 1 PUFF TWICE A DAY     ---    *  Abilify 10mg  tablet TAKE 1 TABLET BY MOUTH ONCE A DAY     ---    * Spiriva HandiHaler 18 MCG 1 CAP Inhalation daily     ---    * ARIPiprazole 15 MG Tablet TAKE 1 TABLET BY MOUTH EVERY DAY     ---    * Zanaflex 4MG  1 TAB ORAL 2-3 times daily     ---    Medication List reviewed and reconciled with the patient    ---      Past Medical History    ---      COPD.        ---    Bipolar.        ---    Substance abuse.        ---    Encephalopthy.        ---    Nondependent tobacco use disorder.        ---    Other and unspecified hyperlipidemia.        ---    Essential hypertension, benign.        ---    Lumbago.        ---    Esophageal reflux.        ---    Dietary surveillance and counseling.        ---    Exercise counseling.        ---    Other counseling, not elsewhere classified.        ---    Overweight.        ---    Hypertension.        ---    Hyperlipidemia.        ---    Other abnormal glucose.        ---    Fatigue.        ---    Vitamin D deficiency.        ---    Personal history of tobacco use, presenting hazards to health.        ---    COPD-Stable.        ---    Cough.        ---    Cough.        ---      **Surgical History**    ---      No Surgical History documented.    ---      **Family History**    ---      Father: deceased, lung cancer    ---    Mother: deceased, bone cancer    ---    1 brother(s) , 1 sister(s) - healthy.    ---    husband passed 10/02/14-heart attack 53yo.    ---      **Social History**    ---    _Quality 2017:_    Information Last Updated    Date: _03/19/2018_    Tobacco Use/Smoking    Are you a _former smoker_    How long has it been since you last smoked? _&lt; 1  month_    Depression Screening (PHQ-2) Little interest or pleasure in doing things: Yes,  Feeling down, depressed, or hopeless: Yes.    Depression Evaluation (PHQ-9) Little interest or pleasure in doing things:  Nearly every day , Feeling down, depressed, or hopeless: Nearly every day,  Trouble falling or staying asleep, or sleeping too much: Nearly every day,  Feeling tired or having little  energy: Nearly every day, Poor appetite or  overeating: Several days, Feeling bad about yourself, or that you are a  failure, or have let yourself or your family down: Not at all, Trouble  concentrating on things, such as reading the newspaper or watching television:  Nearly every day, Moving or speaking so slowly that other people could have  noticed. Or the opposite, being so fidgety or restless that you are moving  around a lot more than usual : Not at all, Thoughts that you would be better  off dead, or of hurting yourself in some way: Not at all, Total Score:: 16,  Interpretation:: Moderately severe depression.        smoked for 40 yrs 2 packs per day.      **Hospitalization/Major Diagnostic Procedure**    ---      [facility] - fall, altered mental status injury of head 09/17/2014    ---    [facility]- High Bp, falling and pneumonia 03/21/17    ---        **Vital Signs**    ---    Wt: 178.2, Ht: 61 in, BMI:  33.67  Index, BP: 132/76 mm Hg, HR: 85 /min,  Oxygen sat %: 96 %, Ht-cm: 154.94 cm, Wt-kg: 80.83 kg.      **Assessments**    ---    1. Encounter for general adult medical examination with abnormal findings -  Z00.01 (Primary)    ---    2. Fatigue, unspecified type - R53.83    ---    3. Hypertension - I10    ---    4. Hyperlipidemia - E78.5    ---    5. B12 deficiency - E53.8    ---    6. Hyperglycemia - R73.9    ---    7. Vitamin D deficiency, unspecified - E55.9    ---      **Treatment**    ---      **1. Encounter for general adult medical examination with abnormal  findings**    Refill oxyCODONE HCl  Tablet, 10 MG, 1 tablet, Orally, Twice a day, 30, 60,  Refills 0    Refill KlonoPIN Tablet, 1 MG, TAKE 1 TABLET BY MOUTH 3 TIMES A DAY FOR 14  DAYS, Orally, two times a day, 30 days, 60, Refills 0    _LAB: TSH_    _LAB: COMPREHENSIVE METABOLIC PANEL_    _LAB: LIPID PANEL_    _LAB: CBC_    _LAB: VITAMIN D,25OH_    _LAB: URINALYSIS WITH SEDIMENT_    _LAB: VITAMIN B12_    _LAB: HEMOGLOBIN A1C_    _LAB: IRON_    ---        **2. Fatigue, unspecified type**    _LAB: TSH_    _LAB: COMPREHENSIVE METABOLIC PANEL_    _LAB: LIPID PANEL_    _LAB: CBC_    _LAB: VITAMIN D,25OH_    _LAB: URINALYSIS WITH SEDIMENT_    _LAB: VITAMIN B12_    _LAB: HEMOGLOBIN A1C_    _LAB: IRON_    Notes:  Fatigue: Care Instructions material was printed        **3. Hypertension**    _LAB: TSH_    _LAB: COMPREHENSIVE METABOLIC PANEL_    _LAB: LIPID PANEL_    _LAB: CBC_    _LAB: VITAMIN D,25OH_    _LAB: URINALYSIS WITH SEDIMENT_    _LAB: VITAMIN B12_    _LAB: HEMOGLOBIN A1C_    _LAB: IRON_        **  4. Hyperlipidemia**    _LAB: TSH_    _LAB: COMPREHENSIVE METABOLIC PANEL_    _LAB: LIPID PANEL_    _LAB: CBC_    _LAB: VITAMIN D,25OH_    _LAB: URINALYSIS WITH SEDIMENT_    _LAB: VITAMIN B12_    _LAB: HEMOGLOBIN A1C_    _LAB: IRON_        **5. B12 deficiency**    _LAB: TSH_    _LAB: COMPREHENSIVE METABOLIC PANEL_    _LAB: LIPID PANEL_    _LAB: CBC_    _LAB: VITAMIN D,25OH_    _LAB: URINALYSIS WITH SEDIMENT_    _LAB: VITAMIN B12_    _LAB: HEMOGLOBIN A1C_    _LAB: IRON_        **6. Hyperglycemia**    _LAB: TSH_    _LAB: COMPREHENSIVE METABOLIC PANEL_    _LAB: LIPID PANEL_    _LAB: CBC_    _LAB: VITAMIN D,25OH_    _LAB: URINALYSIS WITH SEDIMENT_    _LAB: VITAMIN B12_    _LAB: HEMOGLOBIN A1C_    _LAB: IRON_        **7. Vitamin D deficiency, unspecified**    _LAB: TSH_    _LAB: COMPREHENSIVE METABOLIC PANEL_    _LAB: LIPID PANEL_    _LAB: CBC_    _LAB: VITAMIN D,25OH_    _LAB: URINALYSIS WITH SEDIMENT_    _LAB: VITAMIN B12_    _LAB: HEMOGLOBIN A1C_    _LAB: IRON_     Electronically signed by Nolon Bussing , DO on 11/20/2022 at 03:38 PM  CDT    Sign off status: Completed        * * *        Nolon Bussing DO    [address]    [address]    [place], [place] [address]    Tel: [phone]    Fax: [phone]              * * *          Progress Note: Cherre Robins, D.O. 03/29/2017    ---    Note generated by [employer] EMR/PM Software ([email or web address])

## 2017-04-04 LAB — HX URINE CULTURE
CASE NUMBER: 2018259000538
HX F: 10000
HX P: 10000

## 2017-05-30 NOTE — Progress Notes (Signed)
* * *    Leah, [name] **DOB:** 1950/10/20 (66 yo F) **Acc No.** [id number] **DOS:**  05/30/2017    ---        Leah [name]**    ------    64 Y old Female, DOB: 08/27/1950    Account Number: [id number]    [address], [address], [place], [place]    Home: [phone]    Guarantor: Leah, [name] Insurance: [employer] Payer ID: [id number]    Appointment Facility: Nolon Bussing DO        * * *    05/30/2017    Progress Notes: Leah [name], D.O.     ------    ---        **Current Medications**    ---    Taking     * ProAir HFA 108 (90 Base) MCG/ACT Aerosol Solution 2 puffs as needed Inhalation every 6 hrs     ---    * hydrOXYzine HCl 25 MG Tablet 1 tablet as needed Orally every 8 hrs     ---    * tiZANidine HCl 4 MG Tablet 1 tablet as needed Orally Three to four times a day     ---    * Ferrous Gluconate 325 (36 Fe) MG Tablet 1 tablet Orally Twice a day     ---    * Vitamin D 2000 UNIT Capsule 1 tablet Orally Once a day     ---    * clonazePAM 1 MG Tablet TAKE 1 TABLET BY MOUTH THREE TIMES A DAY FOR 14 DAYS THEN TWICE A DAY Orally Once a day     ---    * Furosemide 40 MG Tablet 1 tablet Orally Once a day , Notes to Pharmacist: Mon, Wed,Fri 40 mg 20mg  other days     ---    * Cymbalta 60 MG Capsule Delayed Release Particles 1 capsule Orally Once a day     ---    * KlonoPIN 1 MG Tablet TAKE 1 TABLET BY MOUTH 3 TIMES A DAY FOR 14 DAYS Orally two times a day     ---    * Amitriptyline HCl 75 MG Tablet TAKE 1 TABLET BY MOUTH EVERY DAY Orally Once a day     ---    * Omeprazole 20 MG Capsule Delayed Release TAKE ONE CAPSULE BY MOUTH TWICE A DAY     ---    * Latuda 120 MG Tablet 1 tablet Orally Once a day     ---    * Atenolol 50 MG Tablet 1 tablet Orally Twice a day     ---    Discontinued    * Amitriptyline HCl 50 MG Tablet 1 tablet Orally Once a day     ---    * Vitamin D 1000 UNIT Tablet 1 tablet Orally Once a day     ---    * Lisinopril 40 MG Tablet 1 tablet Orally Once a day      ---    * Abilify 15 MG Tablet 1 TAB Orally daily     ---    * Pravastatin Sodium 40MG  1 TAB ORAL daily     ---    * Omeprazole 20MG  Tablet Delayed Release 1 TAB ORAL twice daily     ---    * Albuterol Sulfate (2.5 MG/3ML) 0.083% Nebulization Solution 3 ml Inhalation Three times a day     ---    * Advair  Diskus 500-50 MCG/DOSE Aerosol Powder Breath Activated INHALE 1 PUFF TWICE A DAY     ---    * Abilify 10mg  tablet TAKE 1 TABLET BY MOUTH ONCE A DAY     ---    * Spiriva HandiHaler 18 MCG 1 CAP Inhalation daily     ---    * ARIPiprazole 15 MG Tablet TAKE 1 TABLET BY MOUTH EVERY DAY     ---    * Zanaflex 4MG  1 TAB ORAL 2-3 times daily     ---    Medication List reviewed and reconciled with the patient    ---      Past Medical History    ---      COPD.        ---    Bipolar.        ---    Substance abuse.        ---    Encephalopthy.        ---    Nondependent tobacco use disorder.        ---    Other and unspecified hyperlipidemia.        ---    Essential hypertension, benign.        ---    Lumbago.        ---    Esophageal reflux.        ---    Dietary surveillance and counseling.        ---    Exercise counseling.        ---    Other counseling, not elsewhere classified.        ---    Overweight.        ---    Hypertension.        ---    Hyperlipidemia.        ---    Other abnormal glucose.        ---    Fatigue.        ---    Vitamin D deficiency.        ---    Personal history of tobacco use, presenting hazards to health.        ---    COPD-Stable.        ---    Cough.        ---    Cough.        ---      **Surgical History**    ---      No Surgical History documented.    ---      **Family History**    ---      Father: deceased, lung cancer    ---    Mother: deceased, bone cancer    ---    1 brother(s) , 1 sister(s) - healthy.    ---    husband passed 10/02/14-heart attack 53yo.    ---      **Social History**    ---    _Quality 2017:_    Information Last Updated    Date: _03/19/2018_    Tobacco Use/Smoking     Are you a _former smoker_    How long has it been since you last smoked? _&lt; 1 month_    Depression Screening (PHQ-2) Little interest or pleasure in doing things: Yes,  Feeling down, depressed, or hopeless: Yes.    Depression Evaluation (PHQ-9) Little interest or pleasure in doing things:  Nearly every day , Feeling down, depressed, or hopeless:  Nearly every day,  Trouble falling or staying asleep, or sleeping too much: Nearly every day,  Feeling tired or having little energy: Nearly every day, Poor appetite or  overeating: Several days, Feeling bad about yourself, or that you are a  failure, or have let yourself or your family down: Not at all, Trouble  concentrating on things, such as reading the newspaper or watching television:  Nearly every day, Moving or speaking so slowly that other people could have  noticed. Or the opposite, being so fidgety or restless that you are moving  around a lot more than usual : Not at all, Thoughts that you would be better  off dead, or of hurting yourself in some way: Not at all, Total Score:: 16,  Interpretation:: Moderately severe depression.        smoked for 40 yrs 2 packs per day.      **Hospitalization/Major Diagnostic Procedure**    ---      [facility] - fall, altered mental status injury of head 09/17/2014    ---    [facility]- High Bp, falling and pneumonia 03/21/17    ---        **History of Present Illness**    ---    [facility] Pre-Visit Prep_ :    Questions for Pre-visit Prep:    Hospitalized or ER visit since last appt? _No_    Other physicians seen since last appt? _No_      **Vital Signs**    ---    Wt: 180.5, Ht: 61 in, BMI:  34.10  Index, BP: 116/64 mm Hg, HR: 77 /min,  Oxygen sat %: 98 %, Ht-cm: 154.94 cm, Wt-kg: 81.87 kg.      **Assessments**    ---    1. Encounter for general adult medical examination with abnormal findings -  Z00.01 (Primary)    ---    2. Mood disorder - F39    ---    3. Hypertension - I10    ---    4. Lumbago with sciatica,  left side - M54.42    ---    5. Lumbago with sciatica, right side - M54.41    ---    6. B12 deficiency - E53.8    ---    7. Renal insufficiency - N28.9    ---      **Treatment**    ---      **1. Encounter for general adult medical examination with abnormal  findings**    Refill KlonoPIN Tablet, 1 MG, TAKE 1 TABLET BY MOUTH 3 TIMES A DAY FOR 14  DAYS, Orally, two times a day, 30 days, 60, Refills 0    Start oxyCODONE HCl Tablet, 10 MG, 1 tablet as needed, Orally, twice a day, 30  days, 60 Tablet, Refills 0    Start Amitriptyline HCl Tablet, 100 MG, 1 tablet, Orally, Once at night, 30  day(s), 30, Refills 0    ---        **2. Mood disorder**    Notes:  Learning About Mood Disorders material was printed        **3. Others**    Refill Vitamin D Capsule, 2000 UNIT, 1 tablet, Orally, Once a day, 90 days, 90  Tablet, Refills 2    Electronically signed by Nolon Bussing , DO on 11/20/2022 at 03:45 PM  CDT    Sign off status: Completed        * * *        Romana Juniper  Bowman DO    [address]    [address]    [place], [place] [address]    Tel: [phone]    Fax: [phone]              * * *          Progress Note: Cherre Robins, D.O. 05/30/2017    ---    Note generated by eClinicalWorks EMR/PM Software ([email or web address])

## 2017-06-23 NOTE — Progress Notes (Signed)
* * *    Leah [name], Leah [name] **DOB:** 1950-10-23 (66 yo F) **Acc No.** [id number] **DOS:**  06/23/2017    ---        Leah [name]**    ------    75 Y old Female, DOB: 08/26/1950    Account Number: [id number]    [address], [place], [place]    Home: [phone]    Guarantor: Leah [name] Insurance: [employer] Payer ID: [id number]    Appointment Facility: Nolon Bussing DO        * * *    06/23/2017    Progress Notes: [name], D.O.     ------    ---        **Current Medications**    ---    Taking     * Vitamin D 2000 UNIT Capsule 1 tablet Orally Once a day     ---    * KlonoPIN 1 MG Tablet TAKE 1 TABLET BY MOUTH 2 TIMES A DAY Orally two times a day     ---    * oxyCODONE HCl 10 MG Tablet 1 tablet as needed Orally twice a day , stop date 06/29/2017     ---    * Amitriptyline HCl 100 MG Tablet 1 tablet Orally Once at night     ---    * ProAir HFA 108 (90 Base) MCG/ACT Aerosol Solution 2 puffs as needed Inhalation every 6 hrs     ---    * hydrOXYzine HCl 25 MG Tablet 1 tablet as needed Orally every 8 hrs     ---    * tiZANidine HCl 4 MG Tablet 1 tablet as needed Orally Three to four times a day     ---    * Ferrous Gluconate 325 (36 Fe) MG Tablet 1 tablet Orally Twice a day     ---    * clonazePAM 1 MG Tablet TAKE 1 TABLET BY MOUTH THREE TIMES A DAY FOR 14 DAYS THEN TWICE A DAY Orally Once a day     ---    * Furosemide 40 MG Tablet 1 tablet Orally Once a day , Notes to Pharmacist: Mon, Wed,Fri 40 mg 20mg  other days     ---    * Cymbalta 60 MG Capsule Delayed Release Particles 1 capsule Orally Once a day     ---    * Amitriptyline HCl 75 MG Tablet TAKE 1 TABLET BY MOUTH EVERY DAY Orally Once a day     ---    * Omeprazole 20 MG Capsule Delayed Release TAKE ONE CAPSULE BY MOUTH TWICE A DAY     ---    * Atenolol 50 MG Tablet 1 tablet Orally Twice a day     ---    * Latuda 120 MG Tablet 1 tablet Orally Once a day     ---    Medication List reviewed and reconciled with  the patient    ---      Past Medical History    ---      COPD.        ---    Bipolar.        ---    Substance abuse.        ---    Encephalopthy.        ---    Nondependent tobacco use disorder.        ---    Other and unspecified hyperlipidemia.        ---  Essential hypertension, benign.        ---    Lumbago.        ---    Esophageal reflux.        ---    Dietary surveillance and counseling.        ---    Exercise counseling.        ---    Other counseling, not elsewhere classified.        ---    Overweight.        ---    Hypertension.        ---    Hyperlipidemia.        ---    Other abnormal glucose.        ---    Fatigue.        ---    Vitamin D deficiency.        ---    Personal history of tobacco use, presenting hazards to health.        ---    COPD-Stable.        ---    Cough.        ---    Cough.        ---      **Surgical History**    ---      No Surgical History documented.    ---      **Family History**    ---      Father: deceased, lung cancer    ---    Mother: deceased, bone cancer    ---    1 brother(s) , 1 sister(s) - healthy.    ---    husband passed 10/02/14-heart attack 53yo.    ---      **Social History**    ---    _Quality 2017:_    Information Last Updated    Date: _03/19/2018_    Tobacco Use/Smoking    Are you a _former smoker_    How long has it been since you last smoked? _&lt; 1 month_    Depression Screening (PHQ-2) Little interest or pleasure in doing things: Yes,  Feeling down, depressed, or hopeless: Yes.    Depression Evaluation (PHQ-9) Little interest or pleasure in doing things:  Nearly every day , Feeling down, depressed, or hopeless: Nearly every day,  Trouble falling or staying asleep, or sleeping too much: Nearly every day,  Feeling tired or having little energy: Nearly every day, Poor appetite or  overeating: Several days, Feeling bad about yourself, or that you are a  failure, or have let yourself or your family down: Not at all, Trouble  concentrating on things, such  as reading the newspaper or watching television:  Nearly every day, Moving or speaking so slowly that other people could have  noticed. Or the opposite, being so fidgety or restless that you are moving  around a lot more than usual : Not at all, Thoughts that you would be [name]  off dead, or of hurting yourself in some way: Not at all, Total Score:: 16,  Interpretation:: Moderately severe depression.        smoked for 40 yrs 2 packs per day.      **Hospitalization/Major Diagnostic Procedure**    ---      [facility] - fall, altered mental status injury of head 09/17/2014    ---    [facility]- High Bp, falling and pneumonia 03/21/17    ---        **  Reason for Appointment**    ---      1. Follow up and sob w/exertion    ---      **History of Present Illness**    ---    [facility] Pre[facility] Prep_ :    Questions for Pre-visit Prep:    Pre-visit Prep Discussed: _No_    Hospitalized or ER visit since last appt? _No_    Other physicians seen since last appt? _No_    Specialist Consults since last visit? _No_    Patient is OVERDUE for: _Pneumococcal_      **Vital Signs**    ---    Ht: 61 in, BP: 118/64 mm Hg, 122/66 mm Hg, HR: 67 /min, Oxygen sat %: 97 %,  Ht-cm: 154.94 cm.      **Assessments**    ---    1. SOB (shortness of breath) on exertion - R06.02 (Primary)    ---    2. Localized edema - R60.0    ---    3. Hypertension - I10    ---    4. Hyperlipidemia - E78.5    ---    5. Other chronic pain - G89.29    ---    6. Chronic obstructive pulmonary disease, unspecified - J44.9    ---      **Treatment**    ---      **1. SOB (shortness of breath) on exertion**    Notes:  Shortness of Breath: Care Instructions material was printed    ---        **2. Others**    Refill KlonoPIN Tablet, 1 MG, TAKE 1 TABLET BY MOUTH 2 TIMES A DAY, Orally,  two times a day, 30 days, 60, Refills 0    Refill oxyCODONE HCl Tablet, 5 MG, 1 tablet, Orally, twice a day, 30 days, 60  Tablet, Refills 0    Electronically signed by Nolon Bussing , DO on 11/20/2022 at 03:48 PM  CDT    Sign off status: Completed        * * *        Nolon Bussing DO    [address]    [address]    [place], [place] [address]    Tel: [phone]    Fax: [phone]              * * *          Progress Note: Cherre Robins, D.O. 06/23/2017    ---    Note generated by eClinicalWorks EMR/PM Software ([email or web address])

## 2017-08-20 ENCOUNTER — Inpatient Hospital Stay
Admit: 2017-08-20 | Disposition: A | Discharge status: Home Health | Source: Home / Self Care | Attending: Emergency Medicine | Admitting: Emergency Medicine

## 2017-08-20 LAB — HX URINE DIPSTICK W/REFLEX
CASE NUMBER: 2019047001548
HX UA BILIRUBIN: NEGATIVE — NL
HX UA GLUCOSE: NEGATIVE — NL
HX UA HYALINE CASTS: 12 /LPF — ABNORMAL HIGH (ref 0.0–4.0)
HX UA KETONES: 20 mg/dL — AB
HX UA LEUKOCYTE ESTERASE: 75 WBC/uL — AB
HX UA NITRITE: NEGATIVE — NL
HX UA PH: 5 — NL (ref 5.0–8.0)
HX UA PROTEIN: NEGATIVE — NL
HX UA RBC: 1 /HPF — NL (ref 0.0–2.0)
HX UA SPECIFIC GRAVITY: 1.015 — NL (ref 1.003–1.03)
HX UA SQUAMOUS EPITHELIAL: 3 /HPF — NL (ref 0.0–5.0)
HX UA UROBILINOGEN: 4 — AB
HX UA WBC: 27 /HPF — ABNORMAL HIGH (ref 0.0–5.0)

## 2017-08-20 LAB — HX DRUGS OF ABUSE URINE 5
CASE NUMBER: 2019047001615
HX U AMPHETAMINES SCRN: NEGATIVE — NL
HX U BENZODIAZEPINE SCRN: NEGATIVE — NL
HX U COCAINE SCRN: NEGATIVE — NL
HX U ETHANOL INTERP: NEGATIVE — NL
HX U ETHANOL: <3 — NL (ref 0.0–49.0)
HX U OPIATE SCRN: NEGATIVE — NL
HX U PH FOR DAU: 4.5 — NL (ref 4.5–8.0)

## 2017-08-20 LAB — HX COMPREHENSIVE METABOLIC PANEL
CASE NUMBER: 2019047001547
HX ALBUMIN LVL: 2.1 g/dL — ABNORMAL LOW (ref 3.2–5.0)
HX ALKALINE PHOSPHATASE: 212 U/L — ABNORMAL HIGH (ref 30.0–117.0)
HX ALT: 32 U/L — NL (ref 6.0–55.0)
HX ANION GAP: 10 — NL (ref 3.0–11.0)
HX AST: 40 U/L — NL (ref 6.0–40.0)
HX BILIRUBIN TOTAL: 2.7 mg/dL — ABNORMAL HIGH (ref 0.2–1.2)
HX BUN: 37 mg/dL — ABNORMAL HIGH (ref 8.0–23.0)
HX CALCIUM LVL: 8.2 mg/dL — ABNORMAL LOW (ref 8.5–10.5)
HX CHLORIDE: 101 mmol/L — NL (ref 98.0–110.0)
HX CO2: 32 mmol/L — NL (ref 21.0–32.0)
HX CREATININE: 1.49 mg/dL — ABNORMAL HIGH (ref 0.55–1.3)
HX GLUCOSE LVL: 96 mg/dL — NL (ref 70.0–110.0)
HX POTASSIUM LVL: 3.3 mmol/L — ABNORMAL LOW (ref 3.6–5.2)
HX SODIUM LVL: 143 mmol/L — NL (ref 136.0–146.0)
HX TOTAL PROTEIN: 6.4 g/dL — NL (ref 6.0–8.4)

## 2017-08-20 LAB — HX ABO/RH TYPE
CASE NUMBER: 2019047001547
HX ABO/RH TYPE: A POS — NL

## 2017-08-20 LAB — HX .AUTOMATED DIFF
CASE NUMBER: 2019047001547
HX ABSOLUTE BASO COUNT: 0.02 10*3/uL — NL (ref 0.0–0.22)
HX ABSOLUTE EOS COUNT: 0.03 10*3/uL — NL (ref 0.0–0.45)
HX ABSOLUTE LYMPHS COUNT: 0.94 10*3/uL — NL (ref 0.74–5.04)
HX ABSOLUTE MONO COUNT: 0.96 10*3/uL — NL (ref 0.0–1.34)
HX ABSOLUTE NEUTRO COUNT: 6.97 10*3/uL — NL (ref 1.48–7.95)
HX BASOPHILS: 0.2 %
HX EOSINOPHILS: 0.3 %
HX IMMATURE GRANULOCYTES: 0.4 % — NL (ref 0.0–2.0)
HX LYMPHOCYTES: 10.5 %
HX MONOCYTES: 10.7 %
HX NEUTROPHILS: 77.9 %

## 2017-08-20 LAB — HX CBC W/ DIFF
CASE NUMBER: 2019047001547
HX ABSOLUTE NRBC COUNT: 0 10*3/uL
HX HCT: 25 % — ABNORMAL LOW (ref 36.0–47.0)
HX HGB: 7.9 g/dL — ABNORMAL LOW (ref 11.8–16.0)
HX MCH: 37.4 pg — ABNORMAL HIGH (ref 26.0–34.0)
HX MCHC: 31.6 g/dL — NL (ref 31.0–37.0)
HX MCV: 118.5 fL — ABNORMAL HIGH (ref 80.0–100.0)
HX MPV: 8.9 fL — ABNORMAL LOW (ref 9.4–12.4)
HX NRBC PERCENT: 0 % — NL
HX PLATELET: 337 10*3/uL — NL (ref 150.0–400.0)
HX RBC: 2.11 10*6/uL — ABNORMAL LOW (ref 3.9–5.2)
HX RDW-CV: 15.8 % — ABNORMAL HIGH (ref 11.5–14.5)
HX RDW-SD: 69 fL — ABNORMAL HIGH (ref 35.0–51.0)
HX WBC: 9 10*3/uL — NL (ref 3.7–11.2)

## 2017-08-20 LAB — HX CREATINE KINASE
CASE NUMBER: 2019047001547
HX TOTAL CK: 93 U/L — NL (ref 29.0–143.0)

## 2017-08-20 LAB — HX PTT
CASE NUMBER: 2019047001549
HX APTT: 22 s — ABNORMAL LOW (ref 23.0–32.0)

## 2017-08-20 LAB — HX TROPONIN I
CASE NUMBER: 2019047001547
CASE NUMBER: 2019047001876
HX TROPONIN I: <0.015 — NL (ref 0.015–0.045)
HX TROPONIN I: <0.015 — NL (ref 0.015–0.045)

## 2017-08-20 LAB — HX .REDCELL
CASE NUMBER: 2019047001868
HX ADDITIONAL UNITS TO CROSSMATCH: 0 — NL
HX NUMBER OF UNITS TO TRANSFUSE: 1

## 2017-08-20 LAB — HX PT
CASE NUMBER: 2019047001549
HX INR: 1.1
HX PT: 11 s — NL (ref 9.3–11.6)

## 2017-08-20 LAB — HX ANTIBODY SCREEN
CASE NUMBER: 2019047001547
HX ANTIBODY SCREEN AUTOMATED: NEGATIVE — NL

## 2017-08-20 LAB — HX ETHANOL LEVEL
CASE NUMBER: 2019047001616
HX ETHANOL LVL: <3 — NL (ref 0.0–3.0)

## 2017-08-20 LAB — HX GLOMERULAR FILTRATION RATE (ESTIMATED)
CASE NUMBER: 2019047001547
HX AFN AMER GLOMERULAR FILTRATION RATE: 42 mL/min/{1.73_m2}
HX NON-AFN AMER GLOMERULAR FILTRATION RATE: 37 mL/min/{1.73_m2}

## 2017-08-20 LAB — HX MAGNESIUM LEVEL
CASE NUMBER: 2019047001547
HX MAGNESIUM LVL: 2.4 mg/dL — NL (ref 1.7–2.5)

## 2017-08-20 LAB — HX CROSSMATCH: CASE NUMBER: 2019047001547

## 2017-08-20 NOTE — H&P (Signed)
CT thorax resulted and showed reticulonodular opacities in the lingula, left  lower lobe and right middle lobe, for now w/o clinical respiratory symptoms,  no pertinent physical exam findings, no hypoxia will hold off Abxs. Also no  acute pathology noted on CT abd/pelvis.          SIGNATURE LINE Electronically signed by Orson Ape MD, Treyvion Durkee on 08/21/2017  at 00:25:49 EST

## 2017-08-20 NOTE — Consults (Signed)
Reason for Consultation    Abnormal ECG    History of Present Illness    Leah [name] is a 67 year old woman with history notable for bipolar disorder,  anxiety/depression, chronic renal insufficiency, hypertension, history of an  esophageal ulcer, history of choledocholithiasis, anemia, and COPD admitted in  the setting of a mechanical fall. She is a poor historian and she is unable to  provide any medical history at this time. History was obtained from chart  review. She denies chest pain and chest tightness. She denies presyncope and  syncope. Cardiac enzymes have been negative. Her EKG on admission was notable  for anterior T-wave inversions, new from her prior tracing. However, she has  had similar findings on prior EKGs in June and September of last year.  Cardiology was consulted for further evaluation.    Review of Systems    As discussed above in detail. All other systems were reviewed and are  negative.    Physical Exam    Vitals & Measurements    **T:** 98.3 F (Oral) **HR:** 67 **RR:** 20 **BP:** 130/82 **SpO2:** 94%    **HT:** 162 cm **WT:** 85.7 Kg **BMI:** 30.86    Gen: NAD, confused    HEENT: MMM    Neck: no JVD    Resp: CTAB    CVS: RRR    Abd: soft, NTND    Ext: warm, no edema    Impression and Plan    - ?Mechanical fall, altered mental status    - Abnormal ECG    - Bipolar disorder    - Anxiety/depression    - Chronic renal insufficiency    - Hypertension    - History of an esophageal ulcer    - History of choledocholithiasis    - Anemia    - COPD        Plan:    - With regards to her abnormal EKG, she has no complaints of chest pain or  chest tightness. Her cardiac enzymes have been negative. She was admitted in  the setting of a suspected mechanical fall. Of note, she had similar EKG  findings on previous tracings from last year. At this point, would continue to  monitor on telemetry. Echocardiogram to evaluate left ventricular function and  wall motion. Repeat EKG today.        Problem  List/Past Medical History    - Bipolar disorder    - Anxiety/depression    - Chronic renal insufficiency    - Hypertension    - History of an esophageal ulcer    - History of choledocholithiasis    - Anemia    - COPD    Medications    _Inpatient_    Al hydroxide/Mg hydroxide/simethicone, 30 mL, PO, q4hr, PRN    amitriptyline, 50 mg= 1 tab(s), PO, HS    atenolol, 50 mg= 1 tab(s), PO, BID    budesonide 0.5 mg/2 mL inhalation suspension, 0.5 mg= 2 mL, NEB, BID    cefTRIAXone    clonazePAM, 1 mg= 1 tab(s), PO, BID    Cymbalta, 60 mg= 2 cap(s), PO, Daily    docusate, 100 mg= 1 cap(s), PO, BID, PRN    DuoNeb, 3 mL, NEB, q6hr, PRN    ferrous sulfate, 325 mg= 1 tab(s), PO, BID    formoterol 20 mcg/2 mL inhalation solution, 20 mcg= 2 mL, NEB, BID    heparin, 5000 Unit(s)= 1 mL, sc, q8hr    Latuda, 120 mg= 6 tab(s), PO, HS  NaCl 0.9% bolus 1,000 mL, 1000 mL, IV    ondansetron, 4 mg= 2 mL, IV Push, q8hr, PRN    pantoprazole, 40 mg= 1 tab(s), PO, BID    Percocet 5/325 oral tablet, 1 tab(s), PO, q6hr, PRN    Sodium Chloride 0.9% 250 mL, 250 mL, IV    tiZANidine, 4 mg= 1 tab(s), PO, QID, PRN    Vitamin B12, 250 mcg= 2.5 tab(s), PO, Daily    Vitamin D3, 1000 Unit(s)= 1 tab(s), PO, Daily    _Home_    aluminum hydroxide/magnesium hydroxide/simethicone 200 mg-200 mg-20 mg/5 mL  oral suspension, 30 mL, PO, q4hr, PRN    amitriptyline, 100 mg, PO, HS    atenolol, 50 mg, PO, BID    Breo Ellipta 100 mcg-25 mcg/inh inhalation powder, 1 puff(s), INHAL, Daily    clonazePAM 1 mg oral tablet, 1 mg, PO, BID    Cymbalta 60 mg oral delayed release capsule, 60 mg= 1 cap(s), PO, Daily    docusate sodium 100 mg oral capsule, 100 mg= 1 cap(s), PO, BID, PRN, 2 refills    ferrous gluconate 325 mg (36 mg elemental iron) oral tablet, 325 mg= 1 tab(s),  PO, BID    Lasix 40 mg oral tablet, 40 mg, PO, Daily, 2 refills, **Not taking**    Latuda, 120 mg, PO, HS, evening    ondansetron 2 mg/mL injectable solution, 4 mg= 2 mL, IV Push, q8hr, PRN    oxyCODONE  5 mg oral tablet, 5 mg= 1 tab(s), PO, q6hr, PRN, Partial fill upon  request    Protonix, 40 mg, PO, BID    Spiriva 18 mcg inhalation capsule, 18 mcg= 1 cap(s), INHAL, Daily    tiZANidine 4 mg oral tablet, 4 mg= 1 tab(s), PO, QID, PRN    Vitamin B12 250 mcg oral tablet, 250 mcg= 1 tab(s), PO, Daily    Vitamin D3, 1000 Unit(s), PO, Daily    Allergies    NKA    Social History    _Alcohol_    Current, 1-2 times per week    Past    _Substance Abuse_    Never    _Tobacco_    Former smoker, Cigarettes    Family History    High blood pressure: Father, Sister and Brother.    Lab Results    BUN: 32 mg/dL High (36/64/40 34:74:25 EST)    CO2: 31 mmol/L (08/21/17 05:28:00 EST)    Creatinine: 1.32 mg/dL High (95/63/87 56:43:32 EST)    Glucose Lvl: 78 mg/dL (95/18/84 16:60:63 EST)    Hct: 26.4 % Low (08/21/17 05:28:00 EST)    Platelet: 280 thous/mm3 (08/21/17 05:28:00 EST)    Potassium Lvl: 3.1 mmol/L Low (08/21/17 05:28:00 EST)    Sodium Lvl: 143 mmol/L (08/21/17 05:28:00 EST)    Total CK: 93 Units/L (08/20/17 14:55:00 EST)    Troponin I: <0.015 (08/21/17 03:53:00 EST)    WBC: 7.4 thous/mm3 (08/21/17 05:28:00 EST)            ------        Lenox Ponds LINE Electronically signed by Kannan Proia MD, Arlys John on 08/21/2017 at  12:20:41 EST

## 2017-08-20 NOTE — Discharge Summary (Signed)
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    GERD    History of choledocholithiasis requiring percutaneous internal/external  biliary drain placement    History of esophageal ulcer, nonbleeding - Last upper endoscopy in September  2018 showed normal anatomy and no evidence of active or old bleeding    Morbid obesity status post gastric bypass    Peripheral neuropathy of unclear etiology        presented to the ER with complaints of mechanical fall. Patient was found on  the floor by the visiting nurse. Patient was noted to be anemic on admission  and was also noted to have incidental T wave inversions on EKG though she  denied any chest discomfort.            Fall: It appears that patient had multiple falls at home based on chronic  healing wounds on both upper and lower extremities. Patient was evaluated by  physical therapy and they recommended placement to rehabilitation but patient  refused.        Episode of delirium: Patient was supposed to be discharged on 18th of February  2019 but she was delirious so her discharge was withheld. Patient is  symptomatically better and hemodynamically stable on 19th of February and  there was no episode of confusion or delirium. Patient was also evaluated by  psychiatric team.         Acute on chronic macrocytic anemia: Patient strongly denied alcoholic  beverages more than 1-2 times per week. Patient is known to have history of  esophageal ulcer, nonbleeding - Last upper endoscopy in September 2018 showed  normal anatomy and no evidence of active or old bleeding. Patient had 1 unit  of blood transfusion which was ordered by the ER physician. Patient's  hemoglobin and hematocrit was stable at the time of discharge.        EKG on admission revealed T-wave inversions though patient denied any chest  pain or shortness of breath . Patient was also evaluated by cardiology team .  Serial troponin cycle her negative. Echocardiogram done did not reveal any  wall motion abnormalities .        Urinary tract infection : Patient was placed on antibiotics and advised to  continue oral antibodies as prescribed on discharge        Acute on chronic low back pain: :improved. Imaging studies done ruled out an  acute fracture but with Grade 1 spondylolisthesis with possible bilateral  spondylolysis at L4 and L5        Chronic diastolic CHF: stable. Patient's Lasix was on hold for elevated  creatinine. Patient will continue her home dose of Lasix on discharge        COPD: Stable. Continue home inhalers      Procedures and Treatment Provided    Physical Exam    Vitals & Measurements    **T:** 97.6 F (Oral) **TMIN:** 97.4 F (Axillary) **TMAX:** 98.2 F (Oral)  **HR:** 87 **BP:** 124/75 **SpO2:** 99% **O2 Method (L/min):** Room air  **WT:** 84.3 Kg    General: Alert and oriented, no acute distress    Eye: PERRL, Normal Conjunctiva    HEENT: Normocephalic, no damage to dentition, moist oral mucosa, no pharyngeal  erythema, no sinus tenderness    Neck: supple, nontender, carotid pulse WNL, no carotid bruit, no JVD, no  lymphadenopathy, no thyromegaly, full ROM    Respiratory: Lungs clear to auscultation, respirations non-labored, breath  sounds equal and regular, symmetrical chest expansion, no chest  wall  tenderness    Cardiovascular: Normal Rate, normal rhythm, _ beats/minute, no gallop, good  pulses equal in all extremities, normal peripheral perfusion, no edema.    Gastrointestinal: Abdomen soft, non tender, non-distended, normal bowel sounds  all four quadrants, no organomegaly    Integumentary: Multiple old wounds noted in lower extremities and upper  extremities    Neurologic: Alert, Oriented, normal sensory, normal motor, no focal deficits.      Lab Results    No labs resulted in the past 24 hours.    XR LS SPINE    Impression:    1\. Grade 1 spondylolisthesis with possible bilateral spondylolysis at L4 and  L5.    2\. Mildly dilated loops of small bowel in the left flank. The finding is    incompletely visualized on this examination and is of uncertain clinical    significance.        Elizbeth Squires MD 08/20/2017 4:28 PM        XR THORACIC SPINE        Impression:    1\. No evidence of an acute fracture.    2\. Moderate degenerative change in the thoracic spine.        Elizbeth Squires MD 08/20/2017 4:28 PM            CT BRAIN        [1]    IMPRESSION:    Normal intracranial CT examination.            Carron Brazen MD 08/20/2017 5:27 PM        CT THORAX        [2]    IMPRESSION:    1\. No acute pathology.    2\. Reticulonodular opacities in the lingula, left lower lobe and right middle    lobe.        Slobodan Miseljic MD 08/20/2017 10:57 PM            CT ABDOMEN AND PELVIS            [3]    IMPRESSION:    1\. No acute pathology.    2\. Fatty liver.    3\. Splenomegaly.    4\. Status post cholecystectomy.    5\. Status post gastric bypass surgery.    6\. Diverticulosis.    7\. Status post hysterectomy.    8\. Atherosclerosis.        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    GERD    History of choledocholithiasis requiring percutaneous internal/external  biliary drain placement    History of esophageal ulcer, nonbleeding - Last upper endoscopy in September  2018 showed normal anatomy and no evidence of active or old bleeding    Morbid obesity status post gastric bypass    Peripheral neuropathy of unclear etiology      Discharge Medications    _Discharge_    aluminum hydroxide/magnesium hydroxide/simethicone 200 mg-200 mg-20 mg/5 mL  oral suspension, 30 mL, PO, q4hr, PRN    amitriptyline, 100 mg, PO, HS    atenolol, 50 mg, PO, BID    Breo Ellipta 100 mcg-25 mcg/inh inhalation powder, 1 puff(s), INHAL, Daily    Cipro 250 mg oral tablet, 250 mg= 1 tab(s), PO, BID    clonazePAM 1 mg oral tablet, 1 mg, PO, BID    Cymbalta 60 mg oral delayed release capsule, 60 mg= 1 cap(s), PO, Daily    docusate sodium 100 mg oral capsule, 100 mg= 1 cap(s), PO, BID, PRN, 2 refills    ferrous gluconate 325 mg (36 mg elemental iron) oral tablet, 325 mg= 1 tab(s),  PO, BID    Lasix 40 mg oral tablet, 40 mg, PO, Daily, 2 refills, **Not taking**    Latuda, 120 mg, PO, HS, evening    ondansetron 2 mg/mL injectable solution, 4 mg= 2 mL, IV Push, q8hr, PRN    oxyCODONE 5 mg oral tablet, 5 mg= 1 tab(s), PO, q6hr, PRN, Partial fill upon  request    Protonix, 40 mg, PO, BID    Spiriva 18 mcg inhalation capsule, 18 mcg= 1 cap(s), INHAL, Daily    Vitamin B12 250 mcg oral tablet, 250 mcg= 1 tab(s), PO, Daily    Vitamin D3, 1000 Unit(s), PO, Daily    Discharge Instructions    Discharge Instructions:    Fall precaution instructions provided    Follow up with your Primary Care Physician in 1 week    Patient was  strongly recommended for rehab but she refused rehab placement      Face to Face    Face to Face time spent in discharge process is more than 30 minutes    [1] XR Spine Thoracic 3 Views; Claris Gladden MD, Reuel Boom 08/20/2017 15:26 EST    [2] CT Head or Brain [place] Jansky MD, M Elon 08/20/2017 17:19 EST    [3] CT Thorax C-; Miseljic, Slobodan MD 08/20/2017 22:49 EST    [4] CT Abdomen and Pelvis C-; Miseljic, Slobodan MD 08/20/2017 22:49 EST    SIGNATURE LINE Electronically signed by Zane Herald MD-HOSP, Jaceion Aday on 08/23/2017 at  16:12:13 EST

## 2017-08-20 NOTE — Progress Notes (Signed)
Interim History    No acute events    Physical Exam    Vitals & Measurements    **T:** 98.1 F (Oral) **HR:** 88 **RR:** 20 **BP:** 124/91 **BP:** 91/72  (Sitting) **BP:** 87/55 (Standing) **BP:** 119/84 (Supine) **SpO2:** 97%    **HT:** 162 cm **WT:** 85.7 Kg **BMI:** 30.86    Gen: NAD    Resp: CTAB    CVS: RRR    Abd: soft, NTND    Ext: warm, no edema    Impression and Plan    - Mechanical fall, altered mental status    - Abnormal ECG, though similar to prior tracings from last year    - Bipolar disorder    - Anxiety/depression    - Chronic renal insufficiency    - Hypertension    - History of an esophageal ulcer    - History of choledocholithiasis    - Anemia    - COPD        Plan:    - She remains stable from a cardiac perspective with no complaints of chest  pain. Her echocardiogram was relatively unremarkable. No further cardiac  testing at this time. Will sign off. Please call with questions.        Medications    _Inpatient_    Al hydroxide/Mg hydroxide/simethicone, 30 mL, PO, q4hr, PRN    amitriptyline, 50 mg= 1 tab(s), PO, HS    atenolol, 50 mg= 1 tab(s), PO, BID    budesonide 0.5 mg/2 mL inhalation suspension, 0.5 mg= 2 mL, NEB, BID    cefTRIAXone    clonazePAM, 1 mg= 1 tab(s), PO, BID    Cymbalta, 60 mg= 2 cap(s), PO, Daily    docusate, 100 mg= 1 cap(s), PO, BID, PRN    DuoNeb, 3 mL, NEB, q6hr, PRN    ferrous sulfate, 325 mg= 1 tab(s), PO, BID    formoterol 20 mcg/2 mL inhalation solution, 20 mcg= 2 mL, NEB, BID    Latuda, 120 mg= 6 tab(s), PO, HS    NaCl 0.9% bolus 1,000 mL, 1000 mL, IV    ondansetron, 4 mg= 2 mL, IV Push, q8hr, PRN    pantoprazole, 40 mg= 1 tab(s), PO, BID    Percocet 5/325 oral tablet, 1 tab(s), PO, q6hr, PRN    tiZANidine, 4 mg= 1 tab(s), PO, QID, PRN    Vitamin B12, 250 mcg= 2.5 tab(s), PO, Daily    Vitamin D3, 1000 Unit(s)= 1 tab(s), PO, Daily    Lab Results    BUN: 25 mg/dL High (21/30/86 57:84:69 EST)    CO2: 32 mmol/L (08/22/17 05:51:00 EST)    Creatinine: 1.36 mg/dL High  (62/95/28 41:32:44 EST)    Glucose Lvl: 82 mg/dL (07/06/70 53:66:44 EST)    Hct: 30.3 % Low (08/22/17 05:51:00 EST)    Platelet: 287 thous/mm3 (08/22/17 05:51:00 EST)    Potassium Lvl: 3 mmol/L Low (08/22/17 05:51:00 EST)    Sodium Lvl: 145 mmol/L (08/22/17 05:51:00 EST)    WBC: 4.6 thous/mm3 (08/22/17 05:51:00 EST)            ------        SIGNATURE LINE Electronically signed by Yuval Rubens MD, Arlys John on 08/22/2017 at  12:22:05 EST

## 2017-08-20 NOTE — H&P (Signed)
Chief Complaint    Fall    History of Present Illness    67 YO F w/extensive PMH as outlined below who presents to the ER s/p  mechanical fall currently with acute on chronic back pain found to have acute  on chronic anemia and incidentally T wave abnormality/inversion. It appears  that [facility] found her on the floor at around 1 PM earlier today. Mechanism of her  fall is somewhat unclear, She recollects likely a mechanical fall while using  her walker from her living room to the bathroom, But that happened at an  unknown time. She definitely denies any prodromal symptoms of chest pain,  chest pressure, palpitations, dizziness, lightheadedness prior to the event,  positive head strike but no loss of consciousness. No near-syncopal or  syncopal events otherwise. Last echo in Sep 2018 with LVEF 65% and no WMA. No  history of coronary artery disease, no exertional chest pain. 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 First troponin  normal.        The patient denies any abdominal pain, bright red blood per rectum, melena. No  macroscopic source of bleeding and rectal exam by ER physician negative. Her  abdominal exam on superficial and deep palpation remains benign. H/H 7.9/25.0  with a baseline around 9.0. She has known macrocytic anemia, ? Alcohol  dependence although the patient strongly denies alcoholic beverages more than  1-2 times per week, no DTs, no seizures. She does have history of esophageal  ulcer, nonbleeding with last upper endoscopy in September 2018 showed normal  anatomy and no evidence of active or old bleeding. CT abd/pelvis as well as CT  thorax pending.        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 Alcohol dependence although the patient  strongly denies  alcoholic beverages more than 1-2 times per week, no DTs, no seizures    Chronic low back pain on opiates    GERD    History of choledocholithiasis requiring percutaneous internal/external  biliary drain placement    History of esophageal ulcer, nonbleeding - Last upper endoscopy in September  2018 showed normal anatomy and no evidence of active or old bleeding    Morbid obesity status post gastric bypass    Peripheral neuropathy of unclear etiology    Review of Systems    A 12-point ROS was performed and is negative, pertinent positive findings as  noted per HPI    Code Status    Code Status - Ordered    -- 08/20/17 18:05:00 EST, Full Resuscitation, Constant Order    Physical Exam    Vitals & Measurements    **T:** 98.5 F (Oral) **HR:** 81 **RR:** 9 **BP:** 173/126 **SpO2:** 97%  **WT:** 81 Kg    General: AAO x 3, not in acute distress    Eye: PERRL, pale sclerae    HEENT: Normocephalic, moist oral mucosa    Neck: supple, no JVD    Respiratory: Good bilateral air entry. Lungs clear to auscultation    Cardiovascular: s1 + s2, RRR, no murmurs. No pedal edema    Gastrointestinal: BS+, soft, right upper quadrant discomfort, non-distended,  no rebound or guarding    Genitourinary:  No suprapubic or CVA tenderness    Integumentary: Skin intact, warm. No pallor, no rash.    Neurologic: no focal deficits    Impression and Plan    67 YO F w/extensive PMH as outlined below who presents to the ER s/p  mechanical fall currently with acute on chronic back pain found to have acute  on chronic anemia and incidentally T wave abnormality/inversion        #Acute on chronic macrocytic anemia: ? 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,  tele, repeat echo, beta blocker, Cardiology c/s much appreciated    #AKI on CKD stage II: baseline Cr 1.0-1.2, appears slightly hypovolemic,  cautious fluid repletion and blood products, avoid nephrotoxic agents, repeat  BMP in am    #Cystitis: will treat with IV CFTx, follow ucx, blood cxs    #Acute on chronic low back pain: XRay w/o acute fracture but with Grade 1  spondylolisthesis with possible bilateral spondylolysis at L4 and L5, CT  abd/pelvis and CT thorax pending, pain control    #Chronic diastolic CHF: hypovolemic to euvolemic, for now holding lasix but  will be very cautious of volume status    #Essential hypertension: beta blocker, we may need to re-initiate CCB - she  has been off CCB and ACEi since last admission with softer BP    #COPD With asthmatic component: stable, home inhalers    #GERD: PPI    #Bipolar disorder/Depression/anxiety: c/w home meds        DVT ppx: SCD, chemoppx ok to give after negative imaging for bleeding    Full code    CMS Two-Midnight Rule    I certify that hospital inpatient services are reasonable and medically  necessary. They are appropriately provided as inpatient services in accordance  with the two midnight benchmark under 42CFR 412 3(e), or the services are  specified as inpatient only procedure under 42 CFR 419 22(n).        Problem List/Past Medical History    Ongoing    Asthma    Bipolar 1 disorder    CHF - Congestive heart failure    CKD - chronic kidney disease    COPD - Chronic obstructive pulmonary disease    Hypertension    Neuropathy    Tobacco use    Historical    No qualifying data    Procedure/Surgical History    EGD with Anesthesia (N/A) (03/19/2017), Inspection of Upper Intestinal Tract,  Via Natural or Artificial Opening Endoscopic (03/19/2017), Transfusion of  Nonautologous Red  Blood Cells into Peripheral Vein, Percutaneous Approach  (03/16/2017), EGD with Anesthesia (N/A) (12/23/2016), Inspection of Upper  Intestinal Tract, Via Natural or Artificial Opening Endoscopic (12/23/2016),  Transfusion of Nonautologous Red Blood Cells into Peripheral Vein,  Percutaneous Approach (12/20/2016), Drainage of Common Bile Duct with Drainage  Device, Percutaneous Approach (08/06/2016), Removal of Drainage Device from  Hepatobiliary Duct, External Approach (08/06/2016), Excision of Ampulla of  Vater, Percutaneous Approach, Diagnostic (08/05/2016), Drainage of Common Bile  Duct with Drainage Device,  Percutaneous Approach (07/29/2016), Fluoroscopy of  Biliary and Pancreatic Ducts using Other Contrast (07/29/2016), TRAN NAUTO RED  BLD CLL PERIPH VN PC (10/04/2014), Gastric bypass surgery (2012).    Social History    _Alcohol_    Current, 1-2 times per week    Past    _Substance Abuse_    Never    _Tobacco_    Former smoker, Cigarettes    Family History    High blood pressure: Father, Sister and Brother.    Allergies    NKA    Medications    _Inpatient_    Al hydroxide/Mg hydroxide/simethicone, 30 mL, PO, q4hr, PRN    amitriptyline, 50 mg= 1 tab(s), PO, HS    atenolol, 50 mg= 1 tab(s), PO, BID    budesonide 0.5 mg/2 mL inhalation suspension, 0.5 mg= 2 mL, NEB, BID    cefTRIAXone    clonazePAM, 1 mg= 1 tab(s), PO, BID    Cymbalta, 60 mg= 2 cap(s), PO, Daily    docusate, 100 mg= 1 cap(s), PO, BID, PRN    DuoNeb, 3 mL, NEB, q6hr, PRN    ferrous sulfate, 325 mg= 1 tab(s), PO, BID    formoterol 20 mcg/2 mL inhalation solution, 20 mcg= 2 mL, NEB, BID    Latuda, 120 mg= 6 tab(s), PO, HS    NaCl 0.9% bolus 1,000 mL, 1000 mL, IV    ondansetron, 4 mg= 2 mL, IV Push, q8hr, PRN    pantoprazole, 40 mg= 1 tab(s), PO, BID    Percocet 5/325 oral tablet, 1 tab(s), PO, q6hr, PRN    Sodium Chloride 0.9% 250 mL, 250 mL, IV    tiZANidine, 4 mg= 1 tab(s), PO, QID, PRN    Vitamin B12, 250 mcg= 2.5 tab(s), PO, Daily    Vitamin D3,  1000 Unit(s)= 1 tab(s), PO, Daily    _Home_    aluminum hydroxide/magnesium hydroxide/simethicone 200 mg-200 mg-20 mg/5 mL  oral suspension, 30 mL, PO, q4hr, PRN    amitriptyline, 50 mg, PO, HS    atenolol, 50 mg, PO, BID    Breo Ellipta 100 mcg-25 mcg/inh inhalation powder, 1 puff(s), INHAL, Daily    clonazePAM 1 mg oral tablet, 1 mg, PO, BID    Cymbalta 60 mg oral delayed release capsule, 60 mg= 1 cap(s), PO, Daily    docusate sodium 100 mg oral capsule, 100 mg= 1 cap(s), PO, BID, PRN, 2 refills    ferrous gluconate 325 mg (36 mg elemental iron) oral tablet, 325 mg= 1 tab(s),  PO, BID    Lasix 40 mg oral tablet, 40 mg, PO, Daily, 2 refills    Latuda, 120 mg, PO, HS, evening    ondansetron 2 mg/mL injectable solution, 4 mg= 2 mL, IV Push, q8hr, PRN    oxyCODONE 5 mg oral tablet, 5 mg= 1 tab(s), PO, q6hr, PRN, Partial fill upon  request    pantoprazole 40 mg oral delayed release tablet, 40 mg= 1 tab(s), PO, BID    Spiriva 18 mcg inhalation capsule, 18 mcg= 1 cap(s), INHAL, Daily    tiZANidine 4 mg oral tablet, 4 mg= 1 tab(s), PO, QID, PRN    Vitamin B12 250 mcg oral tablet, 250 mcg= 1 tab(s), PO, Daily    Vitamin D3, 1000 Unit(s), PO, Daily    Diet    Regular Diet - Ordered    -- 08/20/17 18:05:00 EST, Assist Room Service, Scheduled / PRN    Lab Results    Glucose Lvl: 96 mg/dL (02/58/52 77:82:42  EST)    BUN: 37 mg/dL High (09/81/19 14:78:29 EST)    Creatinine: 1.49 mg/dL High (56/21/30 86:57:84 EST)    Afn Amer Glomerular Filtration Rate: 42 ml/min/1.54m2 (08/20/17 14:55:00 EST)    Non-Afn Amer Glomerular Filtration Rate: 37 ml/min/1.12m2 (08/20/17 14:55:00  EST)    Sodium Lvl: 143 mmol/L (08/20/17 14:55:00 EST)    Potassium Lvl: 3.3 mmol/L Low (08/20/17 14:55:00 EST)    Chloride: 101 mmol/L (08/20/17 14:55:00 EST)    CO2: 32 mmol/L (08/20/17 14:55:00 EST)    Anion Gap: 10 (08/20/17 14:55:00 EST)    Total Protein: 6.4 Gm/dL (69/62/95 28:41:32 EST)    Albumin Lvl: 2.1 Gm/dL Low (44/01/02 72:53:66 EST)    Calcium  Lvl: 8.2 mg/dL Low (44/03/47 42:59:56 EST)    Magnesium Lvl: 2.4 mg/dL (38/75/64 33:29:51 EST)    Bilirubin Total: 2.7 mg/dL High (88/41/66 06:30:16 EST)    Alkaline Phosphatase: 212 Units/L High (08/20/17 14:55:00 EST)    AST: 40 Units/L (08/20/17 14:55:00 EST)    ALT: 32 Units/L (08/20/17 14:55:00 EST)    Total CK: 93 Units/L (08/20/17 14:55:00 EST)    Troponin I: <0.015 (08/20/17 14:55:00 EST)    Ethanol Lvl: <3 (08/20/17 14:55:00 EST)    U Amphetamines Scrn: Negative (08/20/17 14:51:00 EST)    U Benzodiazepine Scrn: Negative (08/20/17 14:51:00 EST)    U Cocaine Scrn: Negative (08/20/17 14:51:00 EST)    U Ethanol: <3 (08/20/17 14:51:00 EST)    U Ethanol Interp: Negative (08/20/17 14:51:00 EST)    U Opiate Scrn: Negative (08/20/17 14:51:00 EST)    Confirm Positive: Confirm Upon Request (08/20/17 14:51:00 EST)    WBC: 9 thous/mm3 (08/20/17 14:55:00 EST)    RBC: 2.11 Mil/mm3 Low (08/20/17 14:55:00 EST)    Hgb: 7.9 Gm/dL Low (07/13/30 35:57:32 EST)    Hct: 25 % Low (08/20/17 14:55:00 EST)    Platelet: 337 thous/mm3 (08/20/17 14:55:00 EST)    MCV: 118.5 fL High (08/20/17 14:55:00 EST)    MCH: 37.4 pGm High (08/20/17 14:55:00 EST)    MCHC: 31.6 Gm/dL (20/25/42 70:62:37 EST)    RDW-SD: 69 fL High (08/20/17 14:55:00 EST)    MPV: 8.9 fL Low (08/20/17 14:55:00 EST)    Absolute Neutro Count: 6.97 thous/mm3 (08/20/17 14:55:00 EST)    Absolute Lymphs Count: 0.94 thous/mm3 (08/20/17 14:55:00 EST)    Absolute Mono Count: 0.96 thous/mm3 (08/20/17 14:55:00 EST)    Absolute Eos Count: 0.03 thous/mm3 (08/20/17 14:55:00 EST)    Absolute Baso Count: 0.02 thous/mm3 (08/20/17 14:55:00 EST)    Neutrophils: 77.9 % (08/20/17 14:55:00 EST)    Lymphocytes: 10.5 % (08/20/17 14:55:00 EST)    Monocytes: 10.7 % (08/20/17 14:55:00 EST)    Eosinophils: 0.3 % (08/20/17 14:55:00 EST)    Basophils: 0.2 % (08/20/17 14:55:00 EST)    Immature Granulocytes: 0.4 % (08/20/17 14:55:00 EST)    NRBC Percent: 0 % (08/20/17 14:55:00 EST)    Absolute NRBC  Count: 0 thous/mm3 (08/20/17 14:55:00 EST)    UA Color: Amber1 (08/20/17 14:11:00 EST)    UA Clarity: Hazy Abnormal (08/20/17 14:11:00 EST)    UA Specific Gravity: 1.015 (08/20/17 14:11:00 EST)    UA pH: 5 (08/20/17 14:11:00 EST)    UA Protein: Negative1 (08/20/17 14:11:00 EST)    UA Glucose: Negative1 (08/20/17 14:11:00 EST)    UA Ketones: 20 Abnormal (08/20/17 14:11:00 EST)    UA Bilirubin: Negative1 (08/20/17 14:11:00 EST)    UA Blood: Small Abnormal (08/20/17 14:11:00 EST)    UA Urobilinogen: 4.0 Abnormal (08/20/17 14:11:00  EST)    UA Nitrite: Negative1 (08/20/17 14:11:00 EST)    UA Leukocyte Esterase: 75 Abnormal (08/20/17 14:11:00 EST)    UA RBC: 1 /HPF (08/20/17 14:11:00 EST)    UA WBC: 27 /HPF High (08/20/17 14:11:00 EST)    UA Squamous Epithelial: 3 /HPF (08/20/17 14:11:00 EST)    UA Bacteria: 2+ Abnormal (08/20/17 14:11:00 EST)    UA Mucous: Few (08/20/17 14:11:00 EST)    UA Hyaline Casts: 12 /LPF High (08/20/17 14:11:00 EST)    ABO/Rh Automated: A POS (08/20/17 14:55:00 EST)    Antibody Screen Automated: Negative (08/20/17 14:55:00 EST)    Electronic Crossmatch: Computer XM OK (08/20/17 14:55:00 EST)    PT: 11 sec (08/20/17 14:55:00 EST)    INR: 1.1 (08/20/17 14:55:00 EST)    aPTT: 22 sec Low (08/20/17 14:55:00 EST)    Diagnostic Results    XR thoracic spine    1\. No evidence of an acute fracture.    2\. Moderate degenerative change in the thoracic spine.        XR lumbosacral spine    1\. Grade 1 spondylolisthesis with possible bilateral spondylolysis at L4 and  L5.    2\. Mildly dilated loops of small bowel in the left flank. The finding is  incompletely visualized on this examination and is of uncertain clinical  significance.        CT head C-    Normal intracranial CT examination        ------        SIGNATURE LINE Electronically signed by Orson Ape MD, Korin Setzler on 08/21/2017  at 00:25:49 EST

## 2017-08-20 NOTE — Progress Notes (Signed)
Subjective    patient seen and examined, history and chart reviewed    Denies chest pain palpitation short of breath dizziness or lightheadedness    Feeling tired and weak    No fever or chills    Discussed with RN    Objective    Vitals & Measurements    **T:** 98.3 F (Oral) **TMIN:** 98.1 F (Oral) **TMAX:** 98.5 F (Oral)  **HR:** 67 **RR:** 20 **BP:** 130/82 **SpO2:** 94% **O2 Method (L/min):** Room  air **WT:** 85.7 Kg    Physical Exam    General: AAO x 3, not in acute distress    Eye: PERRL, pale sclerae    HEENT: Normocephalic, moist oral mucosa    Neck: supple, no JVD    Respiratory: Good bilateral air entry. Lungs clear to auscultation    Cardiovascular: s1 + s2, RRR, no murmurs. No pedal edema    Gastrointestinal: BS+, soft, right upper quadrant discomfort, non-distended,  no rebound or guarding    Genitourinary: No suprapubic or CVA tenderness    Integumentary: Skin intact, warm. No pallor, no rash.    Neurologic: no focal deficits    Medications    _Inpatient_    Al hydroxide/Mg hydroxide/simethicone, 30 mL, PO, q4hr, PRN    amitriptyline, 50 mg= 1 tab(s), PO, HS    atenolol, 50 mg= 1 tab(s), PO, BID    budesonide 0.5 mg/2 mL inhalation suspension, 0.5 mg= 2 mL, NEB, BID    cefTRIAXone    clonazePAM, 1 mg= 1 tab(s), PO, BID    Cymbalta, 60 mg= 2 cap(s), PO, Daily    docusate, 100 mg= 1 cap(s), PO, BID, PRN    DuoNeb, 3 mL, NEB, q6hr, PRN    ferrous sulfate, 325 mg= 1 tab(s), PO, BID    formoterol 20 mcg/2 mL inhalation solution, 20 mcg= 2 mL, NEB, BID    heparin, 5000 Unit(s)= 1 mL, sc, q8hr    Latuda, 120 mg= 6 tab(s), PO, HS    NaCl 0.9% bolus 1,000 mL, 1000 mL, IV    ondansetron, 4 mg= 2 mL, IV Push, q8hr, PRN    pantoprazole, 40 mg= 1 tab(s), PO, BID    Percocet 5/325 oral tablet, 1 tab(s), PO, q6hr, PRN    Sodium Chloride 0.9% 250 mL, 250 mL, IV    tiZANidine, 4 mg= 1 tab(s), PO, QID, PRN    Vitamin B12, 250 mcg= 2.5 tab(s), PO, Daily    Vitamin D3, 1000 Unit(s)= 1 tab(s), PO, Daily    Lab  Results    Glucose Lvl: 78 mg/dL (45/40/98 11:91:47 EST)    BUN: 32 mg/dL High (82/95/62 13:08:65 EST)    Creatinine: 1.32 mg/dL High (78/46/96 29:52:84 EST)    Afn Amer Glomerular Filtration Rate: 49 ml/min/1.11m2 (08/21/17 05:28:00 EST)    Non-Afn Amer Glomerular Filtration Rate: 42 ml/min/1.28m2 (08/21/17 05:28:00  EST)    Sodium Lvl: 143 mmol/L (08/21/17 05:28:00 EST)    Potassium Lvl: 3.1 mmol/L Low (08/21/17 05:28:00 EST)    Chloride: 103 mmol/L (08/21/17 05:28:00 EST)    CO2: 31 mmol/L (08/21/17 05:28:00 EST)    Anion Gap: 9 (08/21/17 05:28:00 EST)    Total Protein: 6.4 Gm/dL (13/24/40 10:27:25 EST)    Albumin Lvl: 2.1 Gm/dL Low (36/64/40 34:74:25 EST)    Calcium Lvl: 7.7 mg/dL Low (95/63/87 56:43:32 EST)    Magnesium Lvl: 2.4 mg/dL (95/18/84 16:60:63 EST)    Bilirubin Total: 2.7 mg/dL High (01/60/10 93:23:55 EST)    Alkaline Phosphatase: 212 Units/L High (08/20/17  14:55:00 EST)    AST: 40 Units/L (08/20/17 14:55:00 EST)    ALT: 32 Units/L (08/20/17 14:55:00 EST)    Total CK: 93 Units/L (08/20/17 14:55:00 EST)    Troponin I: <0.015 (08/21/17 03:53:00 EST)    Ethanol Lvl: <3 (08/20/17 14:55:00 EST)    U Amphetamines Scrn: Negative (08/20/17 14:51:00 EST)    U Benzodiazepine Scrn: Negative (08/20/17 14:51:00 EST)    U Cocaine Scrn: Negative (08/20/17 14:51:00 EST)    U Ethanol: <3 (08/20/17 14:51:00 EST)    U Ethanol Interp: Negative (08/20/17 14:51:00 EST)    U Opiate Scrn: Negative (08/20/17 14:51:00 EST)    Confirm Positive: Confirm Upon Request (08/20/17 14:51:00 EST)    WBC: 7.4 thous/mm3 (08/21/17 05:28:00 EST)    RBC: 2.33 Mil/mm3 Low (08/21/17 05:28:00 EST)    Hgb: 8.3 Gm/dL Low (16/10/96 04:54:09 EST)    Hct: 26.4 % Low (08/21/17 05:28:00 EST)    Platelet: 280 thous/mm3 (08/21/17 05:28:00 EST)    MCV: 113.3 fL High (08/21/17 05:28:00 EST)    MCH: 35.6 pGm High (08/21/17 05:28:00 EST)    MCHC: 31.4 Gm/dL (81/19/14 78:29:56 EST)    RDW-SD: 90.1 fL High (08/21/17 05:28:00 EST)    MPV: 8.9 fL Low (08/21/17  05:28:00 EST)    Absolute Neutro Count: 6.97 thous/mm3 (08/20/17 14:55:00 EST)    Absolute Lymphs Count: 0.94 thous/mm3 (08/20/17 14:55:00 EST)    Absolute Mono Count: 0.96 thous/mm3 (08/20/17 14:55:00 EST)    Absolute Eos Count: 0.03 thous/mm3 (08/20/17 14:55:00 EST)    Absolute Baso Count: 0.02 thous/mm3 (08/20/17 14:55:00 EST)    Neutrophils: 77.9 % (08/20/17 14:55:00 EST)    Lymphocytes: 10.5 % (08/20/17 14:55:00 EST)    Monocytes: 10.7 % (08/20/17 14:55:00 EST)    Eosinophils: 0.3 % (08/20/17 14:55:00 EST)    Basophils: 0.2 % (08/20/17 14:55:00 EST)    Immature Granulocytes: 0.4 % (08/20/17 14:55:00 EST)    NRBC Percent: 0 % (08/21/17 05:28:00 EST)    Absolute NRBC Count: 0 thous/mm3 (08/21/17 05:28:00 EST)    UA Color: Amber1 (08/20/17 14:11:00 EST)    UA Clarity: Hazy Abnormal (08/20/17 14:11:00 EST)    UA Specific Gravity: 1.015 (08/20/17 14:11:00 EST)    UA pH: 5 (08/20/17 14:11:00 EST)    UA Protein: Negative1 (08/20/17 14:11:00 EST)    UA Glucose: Negative1 (08/20/17 14:11:00 EST)    UA Ketones: 20 Abnormal (08/20/17 14:11:00 EST)    UA Bilirubin: Negative1 (08/20/17 14:11:00 EST)    UA Blood: Small Abnormal (08/20/17 14:11:00 EST)    UA Urobilinogen: 4.0 Abnormal (08/20/17 14:11:00 EST)    UA Nitrite: Negative1 (08/20/17 14:11:00 EST)    UA Leukocyte Esterase: 75 Abnormal (08/20/17 14:11:00 EST)    UA RBC: 1 /HPF (08/20/17 14:11:00 EST)    UA WBC: 27 /HPF High (08/20/17 14:11:00 EST)    UA Squamous Epithelial: 3 /HPF (08/20/17 14:11:00 EST)    UA Bacteria: 2+ Abnormal (08/20/17 14:11:00 EST)    UA Mucous: Few (08/20/17 14:11:00 EST)    UA Hyaline Casts: 12 /LPF High (08/20/17 14:11:00 EST)    ABO/Rh Automated: A POS (08/20/17 14:55:00 EST)    Antibody Screen Automated: Negative (08/20/17 14:55:00 EST)    Electronic Crossmatch: Computer XM OK (08/20/17 14:55:00 EST)    TRANSFUSED: TRANSFUSED (08/20/17 23:52:27 EST)    PT: 11.1 sec (08/21/17 05:28:00 EST)    INR: 1.1 (08/21/17 05:28:00 EST)    aPTT: 24 sec  (08/21/17 05:28:00 EST)    Diagnostic Results    Impression  and Plan    PMH:    Chronic diastolic CHF    Bipolar disorder    Depression/anxiety    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    GERD    History of choledocholithiasis requiring percutaneous internal/external  biliary drain placement    History of esophageal ulcer, nonbleeding - Last upper endoscopy in September  2018 showed normal anatomy and no evidence of active or old bleeding    Morbid obesity status post gastric bypass    Peripheral neuropathy of unclear etiology        67 YO F w/extensive PMH as outlined below who presents to the ER s/p  mechanical fall currently with acute on chronic back pain found to have acute  on chronic anemia and incidentally T wave abnormality/inversion        #Acute on chronic macrocytic anemia: ? Alcohol dependence although the patient  strongly denies alcoholic beverages more than 1-2 times per week    - History of esophageal ulcer, nonbleeding - Last upper endoscopy in  September 2018 showed normal anatomy and no evidence of active or old bleeding    -s/p 1 PRBC , monitor H/H closely and HD status    -guaic stools x 1 neg        #Moderate T wave abnormality: no exertional or at rest chest pain,    - troponin-3 negative, T wave inversion pattern concerning for Wellens  syndrome, tele    - repeat echo    -c/w beta blocker, Cardiology c/s much appreciated        #AKI on CKD stage II: baseline Cr 1.0-1.2, appears slightly hypovolemic    -cautious fluid repletion and blood products    - avoid nephrotoxic agents        #fall likely mechanical    Physical therapy    Check orthostatic vital signs        #Cystitis:    -c/w IV CFTx, follow ucx, blood cxs        #Acute on chronic low back pain:  XRay w/o acute fracture but with Grade 1  spondylolisthesis with possible bilateral spondylolysis at L4 and L5    - CT abd/pelvis and CT thorax results. Reviewed; no acute pathology.  pain control        #Chronic diastolic CHF: stable    -no overt signs of heart failure    -Hold Lasix given the elevated creatinine        #Essential hypertension: beta blocker, now stable    -re-initiate CCB - she has been off CCB since last admission with softer BP hold ACEI        #COPD With asthmatic component: stable, home inhalers    #GERD: PPI    #Bipolar disorder/Depression/anxiety: c/w home meds        DVT ppx: SCD, heparin    Full code        disposition continue acute care    SIGNATURE LINE Electronically signed by Lorin Picket MD, Shardai Star on 08/21/2017 at  12:25:21 EST

## 2017-08-20 NOTE — Progress Notes (Signed)
Subjective    pt was cleared by PT in am, has been hemodynamically stable    was going to discharge pt, when she had near fall in bathroom, witnessed by  nusring Aide    RN found her to forgetful, confusing months, not remembering her phone num        I visited pts room multiple times, pt is adamant to leave hosp wants to sign  AMA. she lives alone at home, has sister in [place]    I have been seeing her since past 2 days she is orientated but appears to be  unreasonable, thought process is not clear.    I feel at present pt not safe to go home.    pysch consult for competency.        face-to-face time prolonged care 50 minutes    Objective    Vitals & Measurements    **T:** 98.1 F (Oral) **TMIN:** 98.0 F (Oral) **TMAX:** 98.8 F (Oral)  **HR:** 76 **RR:** 18 **BP:** 105/73 **BP:** 91/72 (Sitting) **BP:** 87/55  (Standing) **BP:** 119/84 (Supine) **SpO2:** 98% **O2 Method (L/min):** Room  air    Physical Exam    General: AAO x 3, not in acute distress    Eye: PERRL, pale sclerae    HEENT: Normocephalic, moist oral mucosa    Neck: supple, no JVD    Respiratory: Good bilateral air entry. Lungs clear to auscultation    Cardiovascular: s1 + s2, RRR, no murmurs. No pedal edema    Gastrointestinal: BS+, soft, right upper quadrant discomfort, non-distended,  no rebound or guarding    Genitourinary: No suprapubic or CVA tenderness    Integumentary: Skin intact, warm. No pallor, no rash.    Neurologic: no focal deficits    Medications    _Inpatient_    Al hydroxide/Mg hydroxide/simethicone, 30 mL, PO, q4hr, PRN    amitriptyline, 50 mg= 1 tab(s), PO, HS    atenolol, 50 mg= 1 tab(s), PO, BID    budesonide 0.5 mg/2 mL inhalation suspension, 0.5 mg= 2 mL, NEB, BID    cefTRIAXone    clonazePAM, 1 mg= 1 tab(s), PO, BID    Cymbalta, 60 mg= 2 cap(s), PO, Daily    docusate, 100 mg= 1 cap(s), PO, BID, PRN    DuoNeb, 3 mL, NEB, q6hr, PRN    ferrous sulfate, 325 mg= 1 tab(s), PO, BID    formoterol 20 mcg/2 mL inhalation  solution, 20 mcg= 2 mL, NEB, BID    Latuda, 120 mg= 6 tab(s), PO, HS    NaCl 0.9% bolus 1,000 mL, 1000 mL, IV    ondansetron, 4 mg= 2 mL, IV Push, q8hr, PRN    pantoprazole, 40 mg= 1 tab(s), PO, BID    Percocet 5/325 oral tablet, 1 tab(s), PO, q6hr, PRN    tiZANidine, 4 mg= 1 tab(s), PO, QID, PRN    Vitamin B12, 250 mcg= 2.5 tab(s), PO, Daily    Vitamin D3, 1000 Unit(s)= 1 tab(s), PO, Daily    Lab Results    Glucose Lvl: 82 mg/dL (16/10/96 04:54:09 EST)    BUN: 25 mg/dL High (81/19/14 78:29:56 EST)    Creatinine: 1.36 mg/dL High (21/30/86 57:84:69 EST)    Afn Amer Glomerular Filtration Rate: 47 ml/min/1.68m2 (08/22/17 05:51:00 EST)    Non-Afn Amer Glomerular Filtration Rate: 41 ml/min/1.49m2 (08/22/17 05:51:00  EST)    Sodium Lvl: 145 mmol/L (08/22/17 05:51:00 EST)    Potassium Lvl: 3 mmol/L Low (08/22/17 05:51:00 EST)    Chloride: 105  mmol/L (08/22/17 05:51:00 EST)    CO2: 32 mmol/L (08/22/17 05:51:00 EST)    Anion Gap: 8 (08/22/17 05:51:00 EST)    Calcium Lvl: 8.2 mg/dL Low (62/13/08 65:78:46 EST)    WBC: 4.6 thous/mm3 (08/22/17 05:51:00 EST)    RBC: 2.65 Mil/mm3 Low (08/22/17 05:51:00 EST)    Hgb: 9.4 Gm/dL Low (96/29/52 84:13:24 EST)    Hct: 30.3 % Low (08/22/17 05:51:00 EST)    Platelet: 287 thous/mm3 (08/22/17 05:51:00 EST)    MCV: 114.3 fL High (08/22/17 05:51:00 EST)    MCH: 35.5 pGm High (08/22/17 05:51:00 EST)    MCHC: 31 Gm/dL (40/10/27 25:36:64 EST)    RDW-SD: 90.5 fL High (08/22/17 05:51:00 EST)    MPV: 9.2 fL Low (08/22/17 05:51:00 EST)    NRBC Percent: 0 % (08/22/17 05:51:00 EST)    Absolute NRBC Count: 0 thous/mm3 (08/22/17 05:51:00 EST)          Diagnostic Results    Interpretation Summary    The left ventricle is normal in size.    There is normal left ventricular wall thickness.    The left ventricular wall motion is normal.    The estimated EF = 55%.    The right ventricle is normal in size and function.    The mitral valve is grossly normal.    The aortic valve is trileaflet.    The aortic valve  opens well.    There is trace aortic regurgitation.    The tricuspid valve is not well visualized, but is grossly normal.    Tricuspid regurgitation doppler inadequate and thus, PA pressure could not be    estimated.    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    GERD    History of choledocholithiasis requiring percutaneous internal/external  biliary drain placement    History of esophageal ulcer, nonbleeding - Last upper endoscopy in September  2018 showed normal anatomy and no evidence of active or old bleeding    Morbid obesity status post gastric bypass    Peripheral neuropathy of unclear etiology        67 YO F w/extensive PMH as outlined below who presents to the ER s/p  mechanical fall currently with acute on chronic back pain found to have acute  on chronic anemia and incidentally T wave abnormality/inversion        #Acute on chronic macrocytic anemia: ? Alcohol dependence although the patient  strongly denies alcoholic beverages more than 1-2 times per week    - History of esophageal ulcer, nonbleeding - Last upper endoscopy in  September 2018 showed normal anatomy and no evidence of active or old bleeding    -s/p 1 PRBC , monitor H/H closely and HD status    -guaic stools x 1 neg        #Moderate T wave abnormality: no exertional or at rest chest pain, similar to  previous EKG    - troponin-3 negative    - repeat echo    -c/w beta blocker, Cardiology c/s  much appreciated        #AKI on CKD stage II: baseline Cr 1.0-1.2, appears slightly hypovolemic    -cautious fluid repletion and blood products    - avoid nephrotoxic agents         #fall likely mechanical    Physical therapy    Check orthostatic vital signs        #Cystitis:    -c/w IV CFTx, follow ucx, blood cxs so far negative        #Acute on chronic low back pain:improved XRay w/o acute fracture but with  Grade 1 spondylolisthesis with possible bilateral spondylolysis at L4 and L5    - CT abd/pelvis and CT thorax results. Reviewed; no acute pathology. pain control        #Chronic diastolic CHF: stable    -no overt signs of heart failure    -Hold Lasix given the elevated creatinine        #Essential hypertension: beta blocker, now stable    -re-initiate CCB - she has been off CCB since last admission with softer BP hold ACEI        #COPD With asthmatic component: stable, home inhalers    #GERD: PPI    #Bipolar disorder/Depression/anxiety: c/w home meds        #?competency    Psych consult        DVT ppx: SCD, heparin    Full code        disposition continue care    Can be off telemetry    Hold off on discharge    SIGNATURE LINE Electronically signed by Lorin Picket MD, Felicia Both on 08/22/2017 at  16:53:09 EST

## 2017-08-21 LAB — HX TROPONIN I
CASE NUMBER: 2019048000045
HX TROPONIN I: <0.015 — NL (ref 0.015–0.045)

## 2017-08-21 LAB — HX CBC W/ INDICES
CASE NUMBER: 2019048000363
HX ABSOLUTE NRBC COUNT: 0 10*3/uL
HX HCT: 26.4 % — ABNORMAL LOW (ref 36.0–47.0)
HX HGB: 8.3 g/dL — ABNORMAL LOW (ref 11.8–16.0)
HX MCH: 35.6 pg — ABNORMAL HIGH (ref 26.0–34.0)
HX MCHC: 31.4 g/dL — NL (ref 31.0–37.0)
HX MCV: 113.3 fL — ABNORMAL HIGH (ref 80.0–100.0)
HX MPV: 8.9 fL — ABNORMAL LOW (ref 9.4–12.4)
HX NRBC PERCENT: 0 % — NL
HX PLATELET: 280 10*3/uL — NL (ref 150.0–400.0)
HX RBC: 2.33 10*6/uL — ABNORMAL LOW (ref 3.9–5.2)
HX RDW-CV: 22.7 % — ABNORMAL HIGH (ref 11.5–14.5)
HX RDW-SD: 90.1 fL — ABNORMAL HIGH (ref 35.0–51.0)
HX WBC: 7.4 10*3/uL — NL (ref 3.7–11.2)

## 2017-08-21 LAB — HX BASIC METABOLIC PANEL
CASE NUMBER: 2019048000363
HX ANION GAP: 9 — NL (ref 3.0–11.0)
HX BUN: 32 mg/dL — ABNORMAL HIGH (ref 8.0–23.0)
HX CALCIUM LVL: 7.7 mg/dL — ABNORMAL LOW (ref 8.5–10.5)
HX CHLORIDE: 103 mmol/L — NL (ref 98.0–110.0)
HX CO2: 31 mmol/L — NL (ref 21.0–32.0)
HX CREATININE: 1.32 mg/dL — ABNORMAL HIGH (ref 0.55–1.3)
HX GLUCOSE LVL: 78 mg/dL — NL (ref 70.0–110.0)
HX POTASSIUM LVL: 3.1 mmol/L — ABNORMAL LOW (ref 3.6–5.2)
HX SODIUM LVL: 143 mmol/L — NL (ref 136.0–146.0)

## 2017-08-21 LAB — HX GLOMERULAR FILTRATION RATE (ESTIMATED)
CASE NUMBER: 2019048000363
HX AFN AMER GLOMERULAR FILTRATION RATE: 49 mL/min/{1.73_m2}
HX NON-AFN AMER GLOMERULAR FILTRATION RATE: 42 mL/min/{1.73_m2}

## 2017-08-21 LAB — HX PTT
CASE NUMBER: 2019048000363
HX APTT: 24 s — NL (ref 23.0–32.0)

## 2017-08-21 LAB — HX PT
CASE NUMBER: 2019048000363
HX INR: 1.1
HX PT: 11.1 s — NL (ref 9.3–11.6)

## 2017-08-22 LAB — HX CBC W/ INDICES
CASE NUMBER: 2019049000350
HX ABSOLUTE NRBC COUNT: 0 10*3/uL
HX HCT: 30.3 % — ABNORMAL LOW (ref 36.0–47.0)
HX HGB: 9.4 g/dL — ABNORMAL LOW (ref 11.8–16.0)
HX MCH: 35.5 pg — ABNORMAL HIGH (ref 26.0–34.0)
HX MCHC: 31 g/dL — NL (ref 31.0–37.0)
HX MCV: 114.3 fL — ABNORMAL HIGH (ref 80.0–100.0)
HX MPV: 9.2 fL — ABNORMAL LOW (ref 9.4–12.4)
HX NRBC PERCENT: 0 % — NL
HX PLATELET: 287 10*3/uL — NL (ref 150.0–400.0)
HX RBC: 2.65 10*6/uL — ABNORMAL LOW (ref 3.9–5.2)
HX RDW-CV: 22.3 % — ABNORMAL HIGH (ref 11.5–14.5)
HX RDW-SD: 90.5 fL — ABNORMAL HIGH (ref 35.0–51.0)
HX WBC: 4.6 10*3/uL — NL (ref 3.7–11.2)

## 2017-08-22 LAB — HX BASIC METABOLIC PANEL
CASE NUMBER: 2019049000350
HX ANION GAP: 8 — NL (ref 3.0–11.0)
HX BUN: 25 mg/dL — ABNORMAL HIGH (ref 8.0–23.0)
HX CALCIUM LVL: 8.2 mg/dL — ABNORMAL LOW (ref 8.5–10.5)
HX CHLORIDE: 105 mmol/L — NL (ref 98.0–110.0)
HX CO2: 32 mmol/L — NL (ref 21.0–32.0)
HX CREATININE: 1.36 mg/dL — ABNORMAL HIGH (ref 0.55–1.3)
HX GLUCOSE LVL: 82 mg/dL — NL (ref 70.0–110.0)
HX POTASSIUM LVL: 3 mmol/L — ABNORMAL LOW (ref 3.6–5.2)
HX SODIUM LVL: 145 mmol/L — NL (ref 136.0–146.0)

## 2017-08-22 LAB — HX GLOMERULAR FILTRATION RATE (ESTIMATED)
CASE NUMBER: 2019049000350
HX AFN AMER GLOMERULAR FILTRATION RATE: 47 mL/min/{1.73_m2}
HX NON-AFN AMER GLOMERULAR FILTRATION RATE: 41 mL/min/{1.73_m2}

## 2017-08-25 LAB — HX BLOOD CULTURE
CASE NUMBER: 2019047001878
CASE NUMBER: 2019047001879
HX F: NO GROWTH
HX F: NO GROWTH

## 2017-09-03 ENCOUNTER — Inpatient Hospital Stay: Admit: 2017-09-03 | Disposition: A | Discharge status: Skilled Nursing Facility | Source: Home / Self Care

## 2017-09-03 LAB — HX TROPONIN I
CASE NUMBER: 2019061001223
HX TROPONIN I: <0.015 — NL (ref 0.015–0.045)

## 2017-09-03 LAB — HX .MANUAL DIFF
CASE NUMBER: 2019061001223
HX ABSOLUTE BASO COUNT MANUAL: 0 10*3/uL — NL (ref 0.0–0.22)
HX ABSOLUTE EOS COUNT MANUAL: 0 10*3/uL — NL (ref 0.0–0.45)
HX ABSOLUTE LYMPHS COUNT MANUAL: 0.48 10*3/uL — ABNORMAL LOW (ref 0.74–5.04)
HX ABSOLUTE MONO COUNT MANUAL: 0.24 10*3/uL — NL (ref 0.0–1.34)
HX ABSOLUTE NEUTRO COUNT MANUAL: 11.29 10*3/uL — ABNORMAL HIGH (ref 1.48–7.95)
HX BASOS MANUAL: 0 %
HX EOS MANUAL: 0 %
HX LYMPHS MANUAL: 4 %
HX MONOS MANUAL: 2 %
HX NEUTROPHILS MANUAL: 94 %
HX REACTIVE LYMPHS MANUAL: 0 %

## 2017-09-03 LAB — HX URINE DIPSTICK W/REFLEX
CASE NUMBER: 2019061001224
CASE NUMBER: 2019061001916
HX UA BILIRUBIN: NEGATIVE — NL
HX UA BILIRUBIN: NEGATIVE — NL
HX UA GLUCOSE: NEGATIVE — NL
HX UA GLUCOSE: NEGATIVE — NL
HX UA HYALINE CASTS: 13 /LPF — ABNORMAL HIGH (ref 0.0–4.0)
HX UA KETONES: NEGATIVE — NL
HX UA KETONES: NEGATIVE — NL
HX UA LEUKOCYTE ESTERASE: NEGATIVE — NL
HX UA LEUKOCYTE ESTERASE: NEGATIVE — NL
HX UA NITRITE: NEGATIVE — NL
HX UA NITRITE: NEGATIVE — NL
HX UA PH: 6 — NL (ref 5.0–8.0)
HX UA PH: 5 — NL (ref 5.0–8.0)
HX UA PROTEIN: NEGATIVE — NL
HX UA PROTEIN: 30 mg/dL — AB
HX UA RBC: 7 /HPF — ABNORMAL HIGH (ref 0.0–2.0)
HX UA RBC: 4 /HPF — ABNORMAL HIGH (ref 0.0–2.0)
HX UA RENAL EPITHELIAL: 1 /HPF — ABNORMAL HIGH
HX UA SPECIFIC GRAVITY: 1.019 — NL (ref 1.003–1.03)
HX UA SPECIFIC GRAVITY: 1.021 — NL (ref 1.003–1.03)
HX UA SQUAMOUS EPITHELIAL: <1 — NL (ref 0.0–5.0)
HX UA SQUAMOUS EPITHELIAL: 2 /HPF — NL (ref 0.0–5.0)
HX UA UROBILINOGEN: 4 — AB
HX UA UROBILINOGEN: 4 — AB
HX UA WBC: 1 /HPF — NL (ref 0.0–5.0)
HX UA WBC: 4 /HPF — NL (ref 0.0–5.0)

## 2017-09-03 LAB — HX DRUGS OF ABUSE URINE 9
CASE NUMBER: 2019061001224
HX U AMPHETAMINES SCRN: NEGATIVE — NL
HX U BARBITURATE SCRN: NEGATIVE — NL
HX U BENZODIAZEPINE SCRN: NEGATIVE — NL
HX U CANNABINOID SCRN: NEGATIVE — NL
HX U COCAINE SCRN: NEGATIVE — NL
HX U ETHANOL INTERP: NEGATIVE — NL
HX U ETHANOL: <3 — NL (ref 0.0–49.0)
HX U METHADONE SCRN: NEGATIVE — NL
HX U OPIATE 300 SCRN: NEGATIVE — NL
HX U PH FOR DAU: 6 — NL (ref 4.5–8.0)
HX U PHENCYCLIDINE SCRN: NEGATIVE — NL

## 2017-09-03 LAB — HX CBC W/ DIFF
CASE NUMBER: 2019061001223
HX ABSOLUTE NRBC COUNT: 0 10*3/uL
HX HCT: 27.4 % — ABNORMAL LOW (ref 36.0–47.0)
HX HGB: 8.6 g/dL — ABNORMAL LOW (ref 11.8–16.0)
HX MCH: 36 pg — ABNORMAL HIGH (ref 26.0–34.0)
HX MCHC: 31.4 g/dL — NL (ref 31.0–37.0)
HX MCV: 114.6 fL — ABNORMAL HIGH (ref 80.0–100.0)
HX MPV: 9.1 fL — ABNORMAL LOW (ref 9.4–12.4)
HX NRBC PERCENT: 0 % — NL
HX PLATELET: 290 10*3/uL — NL (ref 150.0–400.0)
HX RBC: 2.39 10*6/uL — ABNORMAL LOW (ref 3.9–5.2)
HX RDW-CV: 18.3 % — ABNORMAL HIGH (ref 11.5–14.5)
HX RDW-SD: 78 fL — ABNORMAL HIGH (ref 35.0–51.0)
HX WBC: 12 10*3/uL — ABNORMAL HIGH (ref 3.7–11.2)

## 2017-09-03 LAB — HX COMPREHENSIVE METABOLIC PANEL
CASE NUMBER: 2019061001223
HX ALBUMIN LVL: 2.1 g/dL — ABNORMAL LOW (ref 3.2–5.0)
HX ALKALINE PHOSPHATASE: 157 U/L — ABNORMAL HIGH (ref 30.0–117.0)
HX ALT: 31 U/L — NL (ref 6.0–55.0)
HX ANION GAP: 9 — NL (ref 3.0–11.0)
HX AST: 46 U/L — ABNORMAL HIGH (ref 6.0–40.0)
HX BILIRUBIN TOTAL: 1.7 mg/dL — ABNORMAL HIGH (ref 0.2–1.2)
HX BUN: 23 mg/dL — NL (ref 8.0–23.0)
HX CALCIUM LVL: 8.5 mg/dL — NL (ref 8.5–10.5)
HX CHLORIDE: 109 mmol/L — NL (ref 98.0–110.0)
HX CO2: 26 mmol/L — NL (ref 21.0–32.0)
HX CREATININE: 1.49 mg/dL — ABNORMAL HIGH (ref 0.55–1.3)
HX GLUCOSE LVL: 142 mg/dL — ABNORMAL HIGH (ref 70.0–110.0)
HX POTASSIUM LVL: 4.5 mmol/L — NL (ref 3.6–5.2)
HX SODIUM LVL: 144 mmol/L — NL (ref 136.0–146.0)
HX TOTAL PROTEIN: 6.8 g/dL — NL (ref 6.0–8.4)

## 2017-09-03 LAB — HX SST GOLD TUBE TO HOLD: CASE NUMBER: 2019061001223

## 2017-09-03 LAB — HX INFLUENZA A&B BY PCR
CASE NUMBER: 2019061001359
HX INFLUENZA A BY PCR: NOT DETECTED
HX INFLUENZA B BY PCR: NOT DETECTED

## 2017-09-03 LAB — HX LACTIC ACID
CASE NUMBER: 2019061001301
CASE NUMBER: 2019061001368
HX LACTIC ACID LVL: 5.1 mmol/L — CR (ref 0.4–2.0)
HX LACTIC ACID LVL: 3.4 mmol/L — ABNORMAL HIGH (ref 0.4–2.0)

## 2017-09-03 LAB — HX BLOOD GAS ARTERIAL
CASE NUMBER: 2019061001858
HX BASE EXCESS ARTERIAL: 5.5 mmol/L — ABNORMAL HIGH (ref <=2.0)
HX FIO2 CONC: 21
HX HCO3 ARTERIAL: 29 mmol/L — ABNORMAL HIGH (ref 22.0–26.0)
HX O2 SAT ARTERIAL: 94.4 % — ABNORMAL LOW (ref 95.0–99.0)
HX PCO2 ARTERIAL: 34.8 mmHg — ABNORMAL LOW (ref 35.0–45.0)
HX PH ARTERIAL: 7.52 — ABNORMAL HIGH (ref 7.35–7.45)
HX PO2 ARTERIAL: 62.2 mmHg — ABNORMAL LOW (ref 80.0–100.0)

## 2017-09-03 LAB — HX PT
CASE NUMBER: 2019061001223
HX INR: 1.3
HX PT: 13.3 s — ABNORMAL HIGH (ref 9.3–11.6)

## 2017-09-03 LAB — HX GLOMERULAR FILTRATION RATE (ESTIMATED)
CASE NUMBER: 2019061001223
HX AFN AMER GLOMERULAR FILTRATION RATE: 42 mL/min/{1.73_m2}
HX NON-AFN AMER GLOMERULAR FILTRATION RATE: 37 mL/min/{1.73_m2}

## 2017-09-03 LAB — HX TSH REFLEX PANEL (RECOMMENDED)
CASE NUMBER: 2019061001381
HX 3RD GEN TSH: 2.79 u[IU]/mL — NL (ref 0.358–3.74)

## 2017-09-03 LAB — HX CREATINE KINASE
CASE NUMBER: 17627796
HX TOTAL CK: 165 U/L — ABNORMAL HIGH (ref 29.0–143.0)

## 2017-09-03 LAB — HX PTT
CASE NUMBER: 2019061001223
HX APTT: 25 s — NL (ref 23.0–32.0)

## 2017-09-03 LAB — HX C-REACTIVE PROTEIN (CRP)
CASE NUMBER: 2019061001223
HX C-REACTIVE PROTEIN: 19.4 mg/dL — ABNORMAL HIGH (ref 0.0–0.8)

## 2017-09-03 LAB — HX AMMONIA LEVEL
CASE NUMBER: 2019061001382
HX AMMONIA: 23 umol/L — NL (ref 11.0–32.0)

## 2017-09-03 LAB — HX MAGNESIUM LEVEL
CASE NUMBER: 2019061001223
HX MAGNESIUM LVL: 1.9 mg/dL — NL (ref 1.7–2.5)

## 2017-09-03 NOTE — Progress Notes (Signed)
Subjective    still disoriented and wishing to go home    HCP activated    PICC line inplace    agitated last night    Review of Systems    Objective    Vitals & Measurements    **T:** 98.5 F (Oral) **TMIN:** 97.5 F (Oral) **TMAX:** 98.5 F (Oral)  **HR:** 106 (Peripheral Pulse) **RR:** 20 **BP:** 116/71 **SpO2:** 95% **O2  Method (L/min):** Room air **WT:** 77 Kg    Physical Exam    General: sitting in chair, not in acute distress, awake    Eye: Normal conjunctiva.    HENT: Normocephalic.    Respiratory: Lungs are clear to auscultation.    Cardiovascular: Normal rate, Regular rhythm, 1+ pitting edema to ankles  bilaterally.    Gastrointestinal: Mildly distended but soft, Non-tender, Normal bowel sounds;  no rebound or guarding    Integumentary: Warm, Dry, Numerous areas of excoriations on hands and feet.  Toes with dry wounds on all toes.    Neurologic: Alert, oriented to self, moving all extremities, no focal deficits    Medications    _Inpatient_    acetaminophen 650 mg rectal suppository, 650 mg= 2 tab(s), PO, q6hr    albuterol 2.5 mg/3 mL (0.083%) inhalation solution, 5 mg= 6 mL, NEB, q6hr, PRN    Ativan, 0.5 mg= 0.25 mL, IV Push, q8hr, PRN    Ativan, 0.5 mg= 1 tab(s), PO, q6hr, PRN    bacitracin 500 units/g topical ointment, 1 app, TOP, ud, PRN    Colace, 100 mg= 1 cap(s), PO, BID    Cymbalta, 60 mg= 2 cap(s), PO, Daily    folic acid, 1 mg= 1 tab(s), PO, Daily    heparin, 5000 Unit(s)= 1 mL, sc, q8hr    labetalol, 50 mg= 0.5 tab(s), PO, BID    lurasidone, 120 mg= 6 tab(s), PO, Daily    MiraLax oral powder for reconstitution, 17 Gm= 1 EA, PO, Daily, PRN    Multiple Vitamins oral liquid, 1 tab(s), PO, Daily    ondansetron, 4 mg= 2 mL, IV Push, q8hr, PRN    oxacillin    Potassium Chloride Oral, 40 mEq= 30 mL, PO, Once    Protonix, 40 mg= 1 tab(s), PO, ACB    senna, 8.6 mg= 1 tab(s), PO, BID    Sodium Chloride 0.9% 250 mL, 250 mL, IV    Sodium Chloride 0.9% Flush, 10 mL, Flush, ud, PRN    Sodium Chloride  0.9% Flush, 10 mL, Flush, q12hr    traMADol, 50 mg= 1 tab(s), PO, q4hr, PRN    ZyPREXA Zydis, 5 mg= 1 tab(s), PO, Daily    Lab Results    Glucose Lvl: 79 mg/dL (47/82/95 62:13:08 EDT)    BUN: 14 mg/dL (65/78/46 96:29:52 EDT)    Creatinine: 1.6 mg/dL High (84/13/24 40:10:27 EDT)    Afn Amer Glomerular Filtration Rate: 39 ml/min/1.76m2 (09/13/17 06:11:00 EDT)    Brent Bulla Amer Glomerular Filtration Rate: 34 ml/min/1.9m2 (09/13/17 06:11:00  EDT)    Sodium Lvl: 141 mmol/L (09/13/17 06:11:00 EDT)    Potassium Lvl: 3.1 mmol/L Low (09/13/17 06:11:00 EDT)    Chloride: 110 mmol/L (09/13/17 06:11:00 EDT)    CO2: 19 mmol/L Low (09/13/17 06:11:00 EDT)    Anion Gap: 12 High (09/13/17 06:11:00 EDT)    Calcium Lvl: 6.9 mg/dL Critical (25/36/64 40:34:74 EDT)    Phosphorus: 6.3 mg/dL High (25/95/63 87:56:43 EDT)    Magnesium Lvl: 2.1 mg/dL (32/95/18 84:16:60 EDT)    WBC: 9 thous/mm3 (09/13/17  06:48:00 EDT)    RBC: 2.01 Mil/mm3 Low (09/13/17 06:48:00 EDT)    Hgb: 7 Gm/dL Low (16/10/96 04:54:09 EDT)    Hct: 21.9 % Low (09/13/17 06:48:00 EDT)    Platelet: 369 thous/mm3 (09/13/17 06:48:00 EDT)    MCV: 109 fL High (09/13/17 06:48:00 EDT)    MCH: 34.8 pGm High (09/13/17 06:48:00 EDT)    MCHC: 32 Gm/dL (81/19/14 78:29:56 EDT)    RDW-SD: 78.2 fL High (09/13/17 06:48:00 EDT)    MPV: 9 fL Low (09/13/17 06:48:00 EDT)    NRBC Percent: 0 % (09/13/17 06:48:00 EDT)    Absolute NRBC Count: 0 thous/mm3 (09/13/17 06:48:00 EDT)    Diagnostic Results    Impression and Plan    67 yo f w/ hx of HFpEF, COPD, chronic pain, falls, gastric bipass, bipolar dz,  presented with confusion after fall, fevers, AKI, sepsis now with MSSA  bacteremia with epidural and psoas abscesses, acute respiratory failure        #Toxic metabolic encephalopathy- due to severe sepsis/resp  failure/Hypernatremia, underlying bipolar    #?Benzodiazepine dependence    # severe sepsis-resolving    #Acute hypoxic + hypercapnic respiratory failure in the setting of severe  sepsis and  altered mental status, resolved    #MSSA lumbar osteomyelitis, Epidural abscess, psoas abscess    #Severe hypokalemia, resolved    #Hypernatremia, resolved    #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 repeat blood cultures negative.  PICC line in    zyprexa at night    recheck CT abdomen with dropping HCT    transfuse blood stat    Continue dysphagia diet. Continue blood glucose monitoring    Home atenolol was switched to labetalol    will activate HCP given pt not able to make decisions    pysch eval appreciated        heparin for dvt ppx    SIGNATURE LINE Electronically signed by Margia Wiesen MD, Harless Litten on 09/13/2017 at  11:30:05 EST

## 2017-09-03 NOTE — Progress Notes (Signed)
Subjective    she feels better today    abdominal pain esp with movement    Review of Systems    Objective    Vitals & Measurements    **T:** 98.6 F (Oral) **TMIN:** 97.1 F (Oral) **TMAX:** 98.6 F (Oral)  **HR:** 97 (Peripheral Pulse) **RR:** 20 **BP:** 130/85 **SpO2:** 100% **O2  Method (L/min):** Room air    Physical Exam    General: upright in bed, not in acute distress, awake, responds to questiosn  appropriately    Eye: Normal conjunctiva.    HENT: Normocephalic.    Respiratory: Lungs are clear to auscultation.    Cardiovascular: Normal rate, Regular rhythm, 1+ pitting edema to hands and  ankles bilaterally.    Gastrointestinal: Mildly distended but soft, Non-tender, Normal bowel sounds;  no rebound or guarding    Integumentary: Warm, Dry, Numerous areas of excoriations on hands and feet.  Toes with dry wounds on all toes.    Neurologic: Alert, oriented to self, moving all extremities, no focal  deficits.    Medications    _Inpatient_    acetaminophen 650 mg rectal suppository, 650 mg= 2 tab(s), PO, q6hr    albuterol 2.5 mg/3 mL (0.083%) inhalation solution, 5 mg= 6 mL, NEB, q6hr, PRN    Ativan, 0.5 mg= 1 tab(s), PO, q6hr, PRN    Colace, 100 mg= 1 cap(s), PO, BID    Cymbalta, 60 mg= 2 cap(s), PO, Daily    folic acid, 1 mg= 1 tab(s), PO, Daily    heparin, 5000 Unit(s)= 1 mL, sc, q8hr    labetalol, 50 mg= 0.5 tab(s), PO, BID    lurasidone, 120 mg= 6 tab(s), PO, Daily    MiraLax oral powder for reconstitution, 17 Gm= 1 EA, PO, Daily, PRN    Multiple Vitamins oral liquid, 1 tab(s), PO, Daily    ondansetron, 4 mg= 2 mL, IV Push, q8hr, PRN    oxacillin    Protonix, 40 mg= 1 tab(s), PO, ACB    senna, 8.6 mg= 1 tab(s), PO, BID    traMADol, 50 mg= 1 tab(s), PO, q4hr, PRN    Lab Results    Blood Glucose POC: 92 mg/dL (78/46/96 29:52:84 EST)    Glucose Lvl: 80 mg/dL (13/24/40 10:27:25 EST)    BUN: 11 mg/dL (36/64/40 34:74:25 EST)    Creatinine: 1.1 mg/dL (95/63/87 56:43:32 EST)    Afn Amer Glomerular Filtration  Rate: 61 ml/min/1.10m2 (09/10/17 06:25:00 EST)    Non-Afn Amer Glomerular Filtration Rate: 53 ml/min/1.9m2 (09/10/17 06:25:00  EST)    Sodium Lvl: 145 mmol/L (09/10/17 06:25:00 EST)    Potassium Lvl: 3.8 mmol/L (09/10/17 13:41:00 EST)    Chloride: 112 mmol/L High (09/10/17 06:25:00 EST)    CO2: 22 mmol/L (09/10/17 06:25:00 EST)    Anion Gap: 11 (09/10/17 06:25:00 EST)    Calcium Lvl: 7.5 mg/dL Low (95/18/84 16:60:63 EST)    Phosphorus: 5.2 mg/dL High (01/60/10 93:23:55 EST)    Magnesium Lvl: 2.1 mg/dL (73/22/02 54:27:06 EST)    Folate Lvl: >20.0 (09/10/17 06:25:00 EST)    WBC: 7.3 thous/mm3 (09/10/17 06:25:00 EST)    RBC: 2.21 Mil/mm3 Low (09/10/17 06:25:00 EST)    Hgb: 7.6 Gm/dL Low (23/76/28 31:51:76 EST)    Hct: 24.9 % Low (09/10/17 06:25:00 EST)    Platelet: 283 thous/mm3 (09/10/17 06:25:00 EST)    MCV: 112.7 fL High (09/10/17 06:25:00 EST)    MCH: 34.4 pGm High (09/10/17 06:25:00 EST)    MCHC: 30.5 Gm/dL Low (16/07/37 10:62:69 EST)  RDW-SD: 82.2 fL High (09/10/17 06:25:00 EST)    MPV: 9.8 fL (09/10/17 06:25:00 EST)    NRBC Percent: 0 % (09/10/17 06:25:00 EST)    Absolute NRBC Count: 0 thous/mm3 (09/10/17 06:25:00 EST)    Diagnostic Results    Impression and Plan    Altered mental status    altered mental status    Sepsis    67 yo f w/ hx of HFpEF, COPD, chronic pain, falls, gastric bipass, bipolar dz,  presented with confusion after fall, fevers, AKI, sepsis now with MSSA  bacteremia with epidural and psoas abscesses, acute respiratory failure        #Toxic metabolic encephalopathy- due to severe sepsis/resp  failure/Hypernatremia    #?Benzodiazepine dependence    # severe sepsis-resolving    #Acute hypoxic + hypercapnic respiratory failure in the setting of severe  sepsis and altered mental status, resolved    #MSSA lumbar osteomyelitis, Epidural abscess, psoas abscess    #Severe hypokalemia, resolved    #Hypernatremia, resolved    #Acute kidney injury on CKD stage II -III- Resolving    # Hypertension  continue labetalol            Plan    Continue oxacillin per ID recommendations. Hold off on PICC line until repeat  blood cultures negative for 48 hours    Per ICU, no area amenable to IR drainage. Consider repeat imaging if blood  cultures do not clear    Pulmonary toilet, avoid sedatives if possible. Aspiration precautions,  Incentive spirometry    Continue supportive care, reorient patient    Ativan when necessary for anxiety or withdrawal symptoms    Continue dysphagia diet. Continue blood glucose monitoring    Home atenolol was switched to labetalol                    DVT prophylaxis heparin    PPI for GI prophylaxis    SIGNATURE LINE Electronically signed by Abigail Miyamoto MD, Damarco Keysor on 09/10/2017 at  15:14:47 EST

## 2017-09-03 NOTE — Progress Notes (Signed)
Subjective    HCT stable    wishing to go home    Review of Systems    Objective    Vitals & Measurements    **T:** 97.8 F (Oral) **TMIN:** 97.8 F (Oral) **TMAX:** 98.6 F (Oral)  **HR:** 73 (Peripheral Pulse) **RR:** 20 **BP:** 140/87 **SpO2:** 100% **O2  Method (L/min):** Room air    Physical Exam    General: sitting in chair, not in acute distress, awake    Eye: Normal conjunctiva.    HENT: Normocephalic.    Respiratory: Lungs are clear to auscultation.    Cardiovascular: Normal rate, Regular rhythm, 1+ pitting edema to ankles  bilaterally.    Gastrointestinal: Mildly distended but soft, Non-tender, Normal bowel sounds;  no rebound or guarding    Integumentary: Warm, Dry, Numerous areas of excoriations on hands and feet.  Toes with dry wounds on all toes.    Neurologic: Alert, oriented to self, moving all extremities, no focal deficits    Medications    _Inpatient_    acetaminophen 650 mg rectal suppository, 650 mg= 2 tab(s), PO, q6hr    albuterol 2.5 mg/3 mL (0.083%) inhalation solution, 5 mg= 6 mL, NEB, q6hr, PRN    Ativan, 0.5 mg= 0.25 mL, IV Push, q8hr, PRN    Ativan, 0.5 mg= 1 tab(s), PO, q6hr, PRN    bacitracin 500 units/g topical ointment, 1 app, TOP, ud, PRN    Colace, 100 mg= 1 cap(s), PO, BID    Cymbalta, 60 mg= 2 cap(s), PO, Daily    folic acid, 1 mg= 1 tab(s), PO, Daily    labetalol, 50 mg= 0.5 tab(s), PO, BID    lurasidone, 120 mg= 6 tab(s), PO, Daily    MiraLax oral powder for reconstitution, 17 Gm= 1 EA, PO, Daily, PRN    Multiple Vitamins oral liquid, 1 tab(s), PO, Daily    Omnipaque 240, 50 mL, PO, Once    ondansetron, 4 mg= 2 mL, IV Push, q8hr, PRN    oxacillin    Protonix, 40 mg= 1 tab(s), PO, ACB    senna, 8.6 mg= 1 tab(s), PO, BID    Sodium Chloride 0.9% Flush, 10 mL, Flush, ud, PRN    Sodium Chloride 0.9% Flush, 10 mL, Flush, q12hr    traMADol, 50 mg= 1 tab(s), PO, q4hr, PRN    ZyPREXA Zydis, 5 mg= 1 tab(s), PO, Daily    Lab Results    Blood Glucose POC: 106 mg/dL (40/10/27 25:36:64  EDT)    Glucose Lvl: 85 mg/dL (40/34/74 25:95:63 EDT)    BUN: 17 mg/dL (87/56/43 32:95:18 EDT)    Creatinine: 1.84 mg/dL High (84/16/60 63:01:60 EDT)    Afn Amer Glomerular Filtration Rate: 33 ml/min/1.52m2 (09/14/17 06:40:00 EDT)    Brent Bulla Amer Glomerular Filtration Rate: 28 ml/min/1.84m2 (09/14/17 06:40:00  EDT)    Sodium Lvl: 141 mmol/L (09/14/17 06:40:00 EDT)    Potassium Lvl: 3 mmol/L Low (09/14/17 06:40:00 EDT)    Chloride: 108 mmol/L (09/14/17 06:40:00 EDT)    CO2: 21 mmol/L (09/14/17 06:40:00 EDT)    Anion Gap: 12 High (09/14/17 06:40:00 EDT)    Calcium Lvl: 7.2 mg/dL Low (10/93/23 55:73:22 EDT)    Phosphorus: 6.1 mg/dL High (02/54/27 06:23:76 EDT)    Magnesium Lvl: 2.2 mg/dL (28/31/51 76:16:07 EDT)    WBC: 9.2 thous/mm3 (09/14/17 06:40:00 EDT)    RBC: 2.86 Mil/mm3 Low (09/14/17 06:40:00 EDT)    Hgb: 9.3 Gm/dL Low (37/10/62 69:48:54 EDT)    Hct: 28.1 % Low (09/14/17 06:40:00 EDT)  Platelet: 382 thous/mm3 (09/14/17 06:40:00 EDT)    MCV: 98.3 fL (09/14/17 06:40:00 EDT)    MCH: 32.5 pGm (09/14/17 06:40:00 EDT)    MCHC: 33.1 Gm/dL (47/82/95 62:13:08 EDT)    RDW-SD: 84.4 fL High (09/14/17 06:40:00 EDT)    MPV: 9.1 fL Low (09/14/17 06:40:00 EDT)    Absolute Neutro Count: 7.15 thous/mm3 (09/14/17 06:40:00 EDT)    Absolute Lymphs Count: 1.13 thous/mm3 (09/14/17 06:40:00 EDT)    Absolute Mono Count: 0.7 thous/mm3 (09/14/17 06:40:00 EDT)    Absolute Eos Count: 0.04 thous/mm3 (09/14/17 06:40:00 EDT)    Absolute Baso Count: 0.03 thous/mm3 (09/14/17 06:40:00 EDT)    Neutrophils: 78.2 % (09/14/17 06:40:00 EDT)    Lymphocytes: 12.3 % (09/14/17 06:40:00 EDT)    Monocytes: 7.6 % (09/14/17 06:40:00 EDT)    Eosinophils: 0.4 % (09/14/17 06:40:00 EDT)    Basophils: 0.3 % (09/14/17 06:40:00 EDT)    Immature Granulocytes: 1.2 % (09/14/17 06:40:00 EDT)    NRBC Percent: 0 % (09/14/17 06:40:00 EDT)    Absolute NRBC Count: 0 thous/mm3 (09/14/17 06:40:00 EDT)    Anisocytosis: 1+ Abnormal (09/14/17 06:40:00 EDT)    Echinocytes: 1+  Abnormal (09/14/17 06:40:00 EDT)    TRANSFUSED: TRANSFUSED (09/13/17 18:33:28 EDT)    Diagnostic Results    Impression and Plan    67 yo f w/ hx of HFpEF, COPD, chronic pain, falls, gastric bipass, bipolar dz,  presented with confusion after fall, fevers, AKI, sepsis now with MSSA  bacteremia with epidural and psoas abscesses, acute respiratory failure        #Toxic metabolic encephalopathy- due to severe sepsis/resp  failure/Hypernatremia, underlying bipolar    #?Benzodiazepine dependence    # severe sepsis-resolving    #Acute hypoxic + hypercapnic respiratory failure in the setting of severe  sepsis and altered mental status, resolved    #MSSA lumbar osteomyelitis, Epidural abscess, psoas abscess    #Severe hypokalemia, resolved    #Hypernatremia, resolved    #Acute kidney injury on CKD stage II -III- Resolving    # Hypertension continue labetalol            Plan    Continue oxacillin per ID recommendations 6 weeks from 3/4    repeat blood cultures negative. PICC line in    zyprexa at night helping    ? recheck CT abdomen with dropping HCT    pt not amenable    transfused blood stat with stable HCT, observe for now. low threshold to image        Continue dysphagia diet. Continue blood glucose monitoring    Home atenolol was switched to labetalol    will activate HCP given pt not able to make decisions    pysch eval appreciated        heparin for dvt ppx    SIGNATURE LINE Electronically signed by Clytee Heinrich MD, Harless Litten on 09/14/2017 at  14:24:03 EST

## 2017-09-03 NOTE — Consults (Signed)
__________________________________________________________________________    CONSULTATION  DATE:  09/05/2017    REASON FOR CONSULTATION:  Lumbar epidural abscess.    HISTORY OF PRESENT ILLNESS:  Leah [name] is older is a 67 year old woman who  lives at home alone and has a history of diastolic congestive heart failure,  COPD, chronic renal disease, bipolar disorder who had multiple hospitalizations  recently for falls, anemia and multiple skin wounds and breakdown.  She was  found at home by her home health aide confused and febrile and was last seen 12  hours prior.  She was brought into the hospital and intubated for respiratory  distress and airway protection.  She had blood cultures positive for Staph and  MRI of the brain and lumbar spine performed.  There is some history of low back  pain in the notes.  History is unobtainable from the patient.  The MRI of the  brain was negative for abscess or any evidence of meningitis.  MRI of the lumbar  spine shows substantial lumbar spondylosis with multilevel degenerative changes  as well as a contrast enhancing collection on the ventral aspect of the epidural  space at L2-3.  This is consistent with an epidural abscess.  Her C-reactive  protein is 19 on admission.    HOSPITAL COURSE:  She is currently intubated, but able to open eyes and respond  to commands and questions appropriately, nodding yes or no.  She is able to  follow commands throughout.    PAST MEDICAL HISTORY:  As noted above.    ALLERGIES:  No known drug allergies.    PAST SURGICAL HISTORY:  Consistent with multiple pancreatobiliary operations.    SOCIAL HISTORY:  Denies substance abuse.  Questionable alcohol use and former  cigarette smoker.      FAMILY HISTORY:  Noncontributory.    MEDICATIONS:  As available for review in the chart.    PHYSICAL EXAMINATION:  VITAL SIGNS:  Blood pressure 136/82.  Heart rate 102.   Temperature 98.5.  T-max for the last 24 hours 102.1.  Respiratory rate 24.   O2  saturations 99% on the ventilator.  NEUROLOGIC:  Eyes to voice.  Pupils equal,  round and reactive to light and accommodation.  Extraocular movements are  intact.  Endotracheal tube in place.  On motor exam, she is 4-/5 in the  bilateral upper extremities with sustained resting tremor in the left upper  extremity, briskly hyperreflexic throughout.  Lower extremities are 2/5 bilaterally.  Sustained clonus at the left ankle and a  positive Babinski.  The patient acknowledges sensation.  Symmetric to light  touch throughout.  Gait not tested.    LABORATORY DATA:  White count 4.0.  Hemoglobin 8.  Hematocrit 24.  Platelet  count 120.  C-reactive protein 19.4.  Sodium 144.  Potassium 4.0.  BUN 16.   Creatinine 1.04.  Calcium 7.6.    RADIOLOGY DATA:  As noted above.    IMPRESSION AND PLAN:  Leah [name] is a 67 year old woman who presents with  sepsis and staph bacteremia.  MRI of the lumbar spine does demonstrate a ventral  epidural abscess in the lumbar spine at L2-3.  There is no significant canal  compromise.  There is baseline facet hypertrophy and ligamentous hypertrophy  throughout with multiple level spondylosis and degenerative disc disease.  She  is globally weak; however, she could be quadriparetic secondary to infectious  source.  The patient will need an MRI of the cervical and thoracic spine with  and without  contrast for evaluation for abscess at additional levels.  Recommend  continued current antibiotic therapy.  Extubation per the ICU team.      We will follow along in her care.    Dictated by:  Mosetta Pigeon, M.D.    D:  09/05/2017 13:58:15  T:  09/05/2017 14:35:54  E:  09/05/2017 14:35:54  RM/wmt  Job# [id number]   SIGNATURE LINE    Electronically signed by Hyacinth Meeker MD, Elana Alm on 09/08/2017 at 15:39:43 EST

## 2017-09-03 NOTE — Consults (Signed)
__________________________________________________________________________    CONSULTATION  DATE:  09/04/2017    HISTORY OF PRESENT ILLNESS:  The patient is a 67 year old woman with a complex  past medical history including hypertension, chronic kidney disease and heart  failure, who presented with alteration of mental status.  Neurological  consultation was requested.  Even though a perfectly reasonable explanation was  arrived giving her the change in mental status.  In essence, she was found to be  septic.  Infectious Disease has seen the patient and Dr. Vickki Muff feels that this  is probably methicillin-susceptible Staphylococcus aureus bacteremia.  The  patient was covered with broad spectrum antibiotics as well as acyclovir.   Lumbar puncture was ordered.  She had a CAT scan of the brain which  demonstrated, by report, no acute intracranial abnormality.  She was initially  admitted to a regular floor, but transferred to the Intensive Care Unit for  further monitoring.    PAST MEDICAL HISTORY:  As above.    ALLERGIES:  No known drug allergies.    SOCIAL HISTORY:  Unknown as I cannot get a history from the patient.    FAMILY HISTORY:  Unknown.    REVIEW OF SYSTEMS:  Cannot be completed.    MEDICATIONS:  1.  Acetaminophen rectal.    2.  Ceftriaxone.  3.  Famotidine.  4.  Subcutaneous heparin.    5.  Potassium chloride.    6.  Thiamine.    7.  Vancomycin.    8.  Fentanyl.    9.  Propofol.    PHYSICAL EXAMINATION:  NEUROLOGIC:  The patient is intubated and unresponsive.   Pupils equal and round but sluggish.  Corneal reflexes present bilaterally.   Positive eye movements with oculocephalic maneuvering.  No grimacing noted.  No  abnormal posturing.  Normal tone, some withdraw to pain but minimal diffusely.   Reflexes depressed.  Toes mute.  Multiple skin lesions noted.    IMPRESSION:  Toxic metabolic encephalopathy secondary to bacteremia in a patient  with multiple medical issues.    PLAN:  I think that the  Intensive Care Unit team as well as Infectious Disease  consultant have this case under good control and appropriately managing the  illness.  From a neurological standpoint, I do not see anything else further to  recommend, however, if she appears to have acute focal neurological symptoms  including seizures, reevaluation would be appropriate.  I think it is fine to  discontinue the acyclovir since we have a source of infection.  I would rely on  Infectious Disease consultant as to which antibiotics would be best to use in  treating this sick patient.  Please reconsult as needed.    Dictated by:  Nyoka Lint, M.D.    D:  09/04/2017 11:34:18  T:  09/05/2017 10:05:32  E:  09/05/2017 10:05:32  JM/jf  Job# [id number]   SIGNATURE LINE    Electronically signed by Esmond Harps MD, Hervey Ard on 09/05/2017 at 10:06:14 EST

## 2017-09-03 NOTE — Progress Notes (Signed)
Subjective    sitting up in a chair    confused and belligerent    Review of Systems    Objective    Vitals & Measurements    **T:** 98.0 [name] (Oral) **TMIN:** 98.0 [name] (Oral) **TMAX:** 98.6 [name] (Oral)  **HR:** 84 (Peripheral Pulse) **RR:** 18 **BP:** 122/58 **SpO2:** 96% **O2  Method (L/min):** Room air    Physical Exam    General: sitting in chair, not in acute distress, awake    Eye: Normal conjunctiva.    HENT: Normocephalic.    Respiratory: Lungs are clear to auscultation.    Cardiovascular: Normal rate, Regular rhythm, 1+ pitting edema to ankles  bilaterally.    Gastrointestinal: Mildly distended but soft, Non-tender, Normal bowel sounds;  no rebound or guarding    Integumentary: Warm, Dry, Numerous areas of excoriations on hands and feet.  Toes with dry wounds on all toes.    Neurologic: Alert, oriented to self, moving all extremities, no focal deficits    Medications    _Inpatient_    acetaminophen 650 mg rectal suppository, 650 mg= 2 tab(s), PO, q6hr    albuterol 2.5 mg/3 mL (0.083%) inhalation solution, 5 mg= 6 mL, NEB, q6hr, PRN    Ativan, 0.5 mg= 0.25 mL, IV Push, q8hr, PRN    Ativan, 0.5 mg= 1 tab(s), PO, q6hr, PRN    bacitracin 500 units/g topical ointment, 1 app, TOP, ud, PRN    Colace, 100 mg= 1 cap(s), PO, BID    Cymbalta, 60 mg= 2 cap(s), PO, Daily    Dextrose 5% in Lactated Ringers 1,000 mL, 1000 mL, IV    folic acid, 1 mg= 1 tab(s), PO, Daily    heparin, 5000 Unit(s)= 1 mL, sc, q8hr    labetalol, 50 mg= 0.5 tab(s), PO, BID    lurasidone, 120 mg= 6 tab(s), PO, Daily    MiraLax oral powder for reconstitution, 17 Gm= 1 EA, PO, Daily, PRN    Multiple Vitamins oral liquid, 1 tab(s), PO, Daily    ondansetron, 4 mg= 2 mL, IV Push, q8hr, PRN    oxacillin    Protonix, 40 mg= 1 tab(s), PO, ACB    senna, 8.6 mg= 1 tab(s), PO, BID    Sodium Chloride 0.9% Flush, 10 mL, Flush, ud, PRN    Sodium Chloride 0.9% Flush, 10 mL, Flush, q12hr    traMADol, 50 mg= 1 tab(s), PO, q4hr, PRN    Lab Results    Blood  Glucose POC: 121 mg/dL High (16/10/96 04:54:09 EDT)    Glucose Lvl: 102 mg/dL (81/19/14 78:29:56 EDT)    BUN: 13 mg/dL (21/30/86 57:84:69 EDT)    Creatinine: 1.56 mg/dL High (62/95/28 41:32:44 EDT)    Afn Amer Glomerular Filtration Rate: 40 ml/min/1.64m2 (09/12/17 06:43:00 EDT)    Brent Bulla Amer Glomerular Filtration Rate: 35 ml/min/1.68m2 (09/12/17 06:43:00  EDT)    Sodium Lvl: 139 mmol/L (09/12/17 06:43:00 EDT)    Potassium Lvl: 3 mmol/L Low (09/12/17 06:43:00 EDT)    Chloride: 109 mmol/L (09/12/17 06:43:00 EDT)    CO2: 21 mmol/L (09/12/17 06:43:00 EDT)    Anion Gap: 9 (09/12/17 06:43:00 EDT)    Calcium Lvl: 7.3 mg/dL Low (07/06/70 53:66:44 EDT)    Phosphorus: 6.2 mg/dL High (03/47/42 59:56:38 EDT)    Magnesium Lvl: 2.2 mg/dL (75/64/33 29:51:88 EDT)    WBC: 8.2 thous/mm3 (09/12/17 06:43:00 EDT)    RBC: 2.27 Mil/mm3 Low (09/12/17 06:43:00 EDT)    Hgb: 7.8 Gm/dL Low (41/66/06 30:16:01 EDT)    Hct: 24.9 %  Low (09/12/17 06:43:00 EDT)    Platelet: 361 thous/mm3 (09/12/17 06:43:00 EDT)    MCV: 109.7 fL High (09/12/17 06:43:00 EDT)    MCH: 34.4 pGm High (09/12/17 06:43:00 EDT)    MCHC: 31.3 Gm/dL (16/10/96 04:54:09 EDT)    RDW-SD: 80.8 fL High (09/12/17 06:43:00 EDT)    MPV: 9 fL Low (09/12/17 06:43:00 EDT)    NRBC Percent: 0 % (09/12/17 06:43:00 EDT)    Absolute NRBC Count: 0 thous/mm3 (09/12/17 06:43:00 EDT)    Diagnostic Results    Impression and Plan    67 yo [name] w/ hx of HFpEF, COPD, chronic pain, falls, gastric bipass, bipolar dz,  presented with confusion after fall, fevers, AKI, sepsis now with MSSA  bacteremia with epidural and psoas abscesses, acute respiratory failure        #Toxic metabolic encephalopathy- due to severe sepsis/resp  failure/Hypernatremia, underlying bipolar    #?Benzodiazepine dependence    # severe sepsis-resolving    #Acute hypoxic + hypercapnic respiratory failure in the setting of severe  sepsis and altered mental status, resolved    #MSSA lumbar osteomyelitis, Epidural abscess, psoas abscess     #Severe hypokalemia, resolved    #Hypernatremia, resolved    #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 repeat blood cultures negative.  PICC line today    Per ICU, no area amenable to IR drainage. Consider repeat imaging if blood  cultures do not clear    Pulmonary toilet, avoid sedatives if possible. Aspiration precautions,  Incentive spirometry    Continue supportive care, reorient patient    Ativan when necessary for anxiety or withdrawal symptoms    Continue dysphagia diet. Continue blood glucose monitoring    Home atenolol was switched to labetalol    will activate HCP given pt not able to make decisions    pysch eval        heparin for dvt ppx    SIGNATURE LINE Electronically signed by Nyna Chilton MD, Harless Litten on 09/12/2017 at  11:39:30 EST

## 2017-09-03 NOTE — Progress Notes (Signed)
Responded to Rapid        Patient was tachypneic to 40, Drowsy but arousable, Hypoxic 90% on 2  L,Tachycardic, Temp of 103.    On exam hyperventilating    CVS S1-S2 tachycardic    Lungs diminished breath sounds    Abdomen distended nontender        A/p Acute respiratory failure    Severe sepsis sec Bacteremia    Stat ABG, And chest x-ray    Given imminent respiratory failure, ICU consult And transfer for intubation.    Discussed with intensivist        Critical care time 50 minutes              SIGNATURE LINE Electronically signed by Lorin Picket MD, Micheala Morissette on 09/06/2017 at  16:27:34 EST

## 2017-09-03 NOTE — Progress Notes (Signed)
Subjective    patient much more alert and awake this morning    Review of Systems    Objective    Vitals & Measurements    **T:** 98.0 F (Axillary) **TMIN:** 97.9 F (Axillary) **TMAX:** 98.5 F  (Oral) **HR:** 91 **RR:** 17 **BP:** 94/69 **SpO2:** 97% **O2 Rate:** 2 **O2  Method (L/min):** Room air    Physical Exam    General: upright in bed, not in acute distress, awake, responds to questiosn  appropriately    Eye: Normal conjunctiva.    HENT: Normocephalic.    Respiratory: Lungs are clear to auscultation.    Cardiovascular: Normal rate, Regular rhythm, 1+ pitting edema to hands and  ankles bilaterally.    Gastrointestinal: Mildly distended but soft, Non-tender, Normal bowel sounds;  no rebound or guarding    Integumentary: Warm, Dry, Numerous areas of excoriations on hands and feet.  Toes with dry wounds on all toes.    Neurologic: Alert, oriented to self, moving all extremities, no focal  deficits.  [1]    Medications    _Inpatient_    acetaminophen 650 mg rectal suppository, 650 mg= 2 tab(s), PO, q6hr    albuterol 2.5 mg/3 mL (0.083%) inhalation solution, 5 mg= 6 mL, NEB, q6hr, PRN    Ativan, 0.5 mg= 1 tab(s), PO, q6hr, PRN    Colace, 100 mg= 1 cap(s), PO, BID    Cymbalta, 60 mg= 2 cap(s), PO, Daily    folic acid, 1 mg= 1 tab(s), PO, Daily    heparin, 5000 Unit(s)= 1 mL, sc, q8hr    labetalol, 50 mg= 0.5 tab(s), PO, BID    lurasidone, 120 mg= 6 tab(s), PO, Daily    MiraLax oral powder for reconstitution, 17 Gm= 1 EA, PO, Daily, PRN    Multiple Vitamins oral liquid, 1 tab(s), PO, Daily    ondansetron, 4 mg= 2 mL, IV Push, q8hr, PRN    oxacillin    Protonix, 40 mg= 1 tab(s), PO, ACB    senna, 8.6 mg= 1 tab(s), PO, BID    traMADol, 50 mg= 1 tab(s), PO, q4hr, PRN    Lab Results    Glucose Lvl: 90 mg/dL (13/24/40 10:27:25 EST)    BUN: 9 mg/dL (36/64/40 34:74:25 EST)    Creatinine: 0.965 mg/dL (95/63/87 56:43:32 EST)    Afn Amer Glomerular Filtration Rate: 71 ml/min/1.43m2 (09/09/17 06:20:00 EST)    Non-Afn  Amer Glomerular Filtration Rate: 62 ml/min/1.20m2 (09/09/17 06:20:00  EST)    Sodium Lvl: 142 mmol/L (09/09/17 06:20:00 EST)    Potassium Lvl: 4.1 mmol/L (09/09/17 06:20:00 EST)    Chloride: 112 mmol/L High (09/09/17 06:20:00 EST)    CO2: 22 mmol/L (09/09/17 06:20:00 EST)    Anion Gap: 8 (09/09/17 06:20:00 EST)    Total Protein: 5.5 Gm/dL Low (95/18/84 16:60:63 EST)    Albumin Lvl: 1.4 Gm/dL Low (01/60/10 93:23:55 EST)    Calcium Lvl: 7.5 mg/dL Low (73/22/02 54:27:06 EST)    Phosphorus: 3.6 mg/dL (23/76/28 31:51:76 EST)    Magnesium Lvl: 2.2 mg/dL (16/07/37 10:62:69 EST)    Bilirubin Total: 1.4 mg/dL High (48/54/62 70:35:00 EST)    Bilirubin Direct: 1.1 mg/dL High (93/81/82 99:37:16 EST)    Alkaline Phosphatase: 191 Units/L High (09/09/17 06:20:00 EST)    AST: 40 Units/L (09/09/17 06:20:00 EST)    ALT: 30 Units/L (09/09/17 06:20:00 EST)    WBC: 6.8 thous/mm3 (09/09/17 06:20:00 EST)    RBC: 2.24 Mil/mm3 Low (09/09/17 06:20:00 EST)    Hgb: 7.8 Gm/dL Low (96/78/93 81:01:75  EST)    Hct: 24.5 % Low (09/09/17 06:20:00 EST)    Platelet: 217 thous/mm3 (09/09/17 06:20:00 EST)    MCV: 109.4 fL High (09/09/17 06:20:00 EST)    MCH: 34.8 pGm High (09/09/17 06:20:00 EST)    MCHC: 31.8 Gm/dL (16/10/96 04:54:09 EST)    RDW-SD: 78.7 fL High (09/09/17 06:20:00 EST)    MPV: 9.9 fL (09/09/17 06:20:00 EST)    NRBC Percent: 0 % (09/09/17 06:20:00 EST)    Absolute NRBC Count: 0 thous/mm3 (09/09/17 06:20:00 EST)    Diagnostic Results    Impression and Plan    Altered mental status        67 yo f w/ hx of HFpEF, COPD, chronic pain, falls, gastric bipass, bipolar dz,  presented with confusion after fall, fevers, AKI, sepsis now with MSSA  bacteremia with epidural and psoas abscesses, acute respiratory failure        #Toxic metabolic encephalopathy- due to severe sepsis/resp  failure/Hypernatremia    #?Benzodiazepine dependence    # severe sepsis-resolving    #Acute hypoxic + hypercapnic respiratory failure in the setting of severe  sepsis  and altered mental status, resolved    #MSSA lumbar osteomyelitis, Epidural abscess, psoas abscess    #Severe hypokalemia, resolved    #Hypernatremia, resolved    #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 Hold off on PICC line until repeat  blood cultures negative for 48 hours    Per ICU, no area amenable to IR drainage. Consider repeat imaging if blood  cultures do not clear    Pulmonary toilet, avoid sedatives if possible. Aspiration precautions,  Incentive spirometry    Continue supportive care, reorient patient    Ativan when necessary for anxiety or withdrawal symptoms    Continue dysphagia diet. Continue blood glucose monitoring    Home atenolol was switched to labetalol        Updated sister Dorthula Rue who lives in [place]. Any further  procedures/interventions will require activation of HCP as patient currently  does not have capacity to make decisions            DVT prophylaxis heparin    PPI for GI prophylaxis            Patient can be transferred to [facility] floor    [1] Progress/SOAP Note; Gerrie Nordmann MD 09/08/2017 11:01 EST    [2] Progress/SOAP Note; Gerrie Nordmann MD 09/08/2017 11:01 EST    SIGNATURE LINE Electronically signed by Allena Katz MD, Dalan Cowger on 09/09/2017 at  13:23:45 EST

## 2017-09-03 NOTE — H&P (Signed)
Chief Complaint    AMS    History of Present Illness    67 year old lady with history of heart failure preserved EF, COPD,  hypertension, chronic kidney disease, presented today with alteration of  mental status. History taking is very limited, patient is alert but lethargic  time and clearly confused, neurological exams limited due to patient current  condition, her head CT showed no acute abnormality. She had an elevated lactic  acid of 5 to improve to 3 after fluid resuscitation. Her abdomen is distended  and slightly tender in the lower left quadrant. Her CT abdomen without  contrast shows no acute abnormality. He does have an elevated white count, is  tachycardic, developed a fever, flu is negative. No lumbar puncture performed  however started on antibiotic, given clinical picture ICU consult was  requested and patient has been evaluated but intensive care attending  overnight and deemed appropriate for ICC level of care. Her ABGs pending as  well as a CT thorax without contrast for further evaluation. Her blood  pressure was maintained. There is no meningitic signs I can elicit, her pupils  are sluggish but reactive to light and accommodation. She will be admitted for  further management, chest x-ray shows no acute abnormality and he urine  analysis is negative. Her medication reconciliation still pending  unfortunately.        Past Medical History    Bipolar disorder    Peripheral neuropathy    Chronic low back pain    Cholangitis with common bile duct stone obstruction    history of multiple falls    History of severe anemia    Question prior history of substance abuse    COPD          Review of Systems    10 point review of system is negative except per history of present illness    Code Status    Code Status - Ordered    -- 09/03/17 22:09:00 EST, Full Resuscitation, Constant Order    Physical Exam    Vitals & Measurements    **T:** 102 F (Rectal) **TMIN:** 102 F (Rectal) **TMAX:** 103.8 F  (Rectal)  **HR:** 110 **RR:** 29 **BP:** 124/66 **SpO2:** 97% **O2 Method (L/min):**  Room air **WT:** 74.84 Kg    General: lethargic    Eye: PERRL, Normal Conjunctiva    HEENT: Normocephalic, no damage to dentition, TM's clear, good light reflex,  normal hearing, moist oral mucosa, no pharyngeal erythema, ear canals patent,  no sinus tenderness    Neck: supple, nontender, carotid pulse WNL, no carotid bruit, no JVD, no  lymphadenopathy, no thyromegaly, full ROM    Respiratory: Lungs clear to auscultation, respirations non-labored, breath  sounds equal and regular, symmetrical chest expansion, no chest wall  tenderness    Cardiovascular: tachy  beats/minute, no gallop, good pulses equal in all  extremities, normal peripheral perfusion,    Gastrointestinal: as per hpi    Integumentary: Skin intact, warm, pink, dry. No pallor, no rash.    Neurologic: As per history of present illness    Cognition and Speech:Lethargic    Psychiatric: Lethargic      Impression and Plan                #Sepsis, possible encephalitis? Vancomycin, Rocephin meningitic dose,  acyclovir IV, IV fluid, trend lactic, aggressive fluid resuscitation,  infectious disease consult. Culture obtained. Appreciate ICU consult.    #History of bipolar disorder, peripheral neuropathy, continue home meds.    #  History of COPD, continue inhalers.    #DVT and GI prophylaxis, inpatient, ICC, more than 2 midnights, full code.      CMS Two-Midnight Rule        Problem List/Past Medical History    Ongoing    Anemia    Anxiety    Asthma    Bipolar 1 disorder    CHF - Congestive heart failure    CKD - chronic kidney disease    COPD - Chronic obstructive pulmonary disease    Depression    GERD    Hypertension    Neuropathy    Historical    Tobacco use    Procedure/Surgical History    Transfusion of Nonautologous Red Blood Cells into Peripheral Vein,  Percutaneous Approach (08/20/2017), EGD with Anesthesia (N/A) (03/19/2017),  Inspection of Upper Intestinal Tract, Via  Natural or Artificial Opening  Endoscopic (03/19/2017), Transfusion of Nonautologous Red Blood Cells into  Peripheral Vein, Percutaneous Approach (03/16/2017), EGD with Anesthesia (N/A)  (12/23/2016), Inspection of Upper Intestinal Tract, Via Natural or Artificial  Opening Endoscopic (12/23/2016), Transfusion of Nonautologous Red Blood Cells  into Peripheral Vein, Percutaneous Approach (12/20/2016), Drainage of Common  Bile Duct with Drainage Device, Percutaneous Approach (08/06/2016), Removal of  Drainage Device from Hepatobiliary Duct, External Approach (08/06/2016),  Excision of Ampulla of Vater, Percutaneous Approach, Diagnostic (08/05/2016),  Drainage of Common Bile Duct with Drainage Device, Percutaneous Approach  (07/29/2016), Fluoroscopy of Biliary and Pancreatic Ducts using Other Contrast  (07/29/2016), TRAN NAUTO RED BLD CLL PERIPH VN PC (10/04/2014), Gastric bypass  surgery (2012).    Social History    _Alcohol_    Current, 1-2 times per week    Past    _Substance Abuse_    Never    _Tobacco_    Former smoker, Cigarettes    Family History    High blood pressure: Father, Sister and Brother.    Allergies    NKA    Medications    _Inpatient_    acetaminophen 650 mg rectal suppository, 650 mg= 1 supp, PR, q4hr    acetaminophen 650 mg rectal suppository, 650 mg= 1 supp, PR, q4hr    acyclovir    bolus NS 1,000 mL, 1000 mL, IV    famotidine, 20 mg= 1 tab(s), PO, BID    heparin, 5000 Unit(s)= 1 mL, sc, q8hr    NaCl 0.9% bolus 1,000 mL, 1000 mL, IV    ondansetron, 4 mg= 2 mL, IV Push, q8hr, PRN    Rocephin    Sodium Chloride 0.9% 1,000 mL, 1000 mL, IV    vancomycin    _Home_    aluminum hydroxide/magnesium hydroxide/simethicone 200 mg-200 mg-20 mg/5 mL  oral suspension, 30 mL, PO, q4hr, PRN    amitriptyline, 100 mg, PO, HS    atenolol, 50 mg, PO, BID    Breo Ellipta 100 mcg-25 mcg/inh inhalation powder, 1 puff(s), INHAL, Daily    clonazePAM 1 mg oral tablet, 1 mg, PO, BID    Cymbalta 60 mg oral delayed release  capsule, 60 mg= 1 cap(s), PO, Daily    docusate sodium 100 mg oral capsule, 100 mg= 1 cap(s), PO, BID, PRN, 2 refills    ferrous gluconate 325 mg (36 mg elemental iron) oral tablet, 325 mg= 1 tab(s),  PO, BID    Lasix 40 mg oral tablet, 40 mg, PO, Daily, 2 refills, **Not taking**    Latuda, 120 mg, PO, HS, evening    ondansetron 2 mg/mL injectable solution, 4 mg= 2 mL,  IV Push, q8hr, PRN    oxyCODONE 5 mg oral tablet, 5 mg= 1 tab(s), PO, q6hr, PRN, Partial fill upon  request    Protonix, 40 mg, PO, BID    Spiriva 18 mcg inhalation capsule, 18 mcg= 1 cap(s), INHAL, Daily    Vitamin B12 250 mcg oral tablet, 250 mcg= 1 tab(s), PO, Daily    Vitamin D3, 1000 Unit(s), PO, Daily    Diet    Cardiac Solid Diet ([facility] Solid Diet) - Ordered    -- 09/03/17 22:08:00 EST, Room Service, Scheduled / PRN    Lab Results    Glucose Lvl: 142 mg/dL High (27/25/36 64:40:34 EST)    BUN: 23 mg/dL (74/25/95 63:87:56 EST)    Creatinine: 1.49 mg/dL High (43/32/95 18:84:16 EST)    Afn Amer Glomerular Filtration Rate: 42 ml/min/1.80m2 (09/03/17 13:15:00 EST)    Non-Afn Amer Glomerular Filtration Rate: 37 ml/min/1.57m2 (09/03/17 13:15:00  EST)    Sodium Lvl: 144 mmol/L (09/03/17 13:15:00 EST)    Potassium Lvl: 4.5 mmol/L (09/03/17 13:15:00 EST)    Chloride: 109 mmol/L (09/03/17 13:15:00 EST)    CO2: 26 mmol/L (09/03/17 13:15:00 EST)    Anion Gap: 9 (09/03/17 13:15:00 EST)    Total Protein: 6.8 Gm/dL (60/63/01 60:10:93 EST)    Albumin Lvl: 2.1 Gm/dL Low (23/55/73 22:02:54 EST)    Calcium Lvl: 8.5 mg/dL (27/06/23 76:28:31 EST)    Magnesium Lvl: 1.9 mg/dL (51/76/16 07:37:10 EST)    Bilirubin Total: 1.7 mg/dL High (62/69/48 54:62:70 EST)    Alkaline Phosphatase: 157 Units/L High (09/03/17 13:15:00 EST)    AST: 46 Units/L High (09/03/17 13:15:00 EST)    ALT: 31 Units/L (09/03/17 13:15:00 EST)    Ammonia: 23 mcMol/L (09/03/17 14:14:00 EST)    Lactic Acid Lvl: 3.4 mmol/L High (09/03/17 15:12:00 EST)    3rd Gen TSH: 2.79 mclU/ml (09/03/17 14:14:00 EST)     Total CK: 165 Units/L High (09/03/17 13:15:00 EST)    Troponin I: <0.015 (09/03/17 13:15:00 EST)    C-Reactive Protein: 19.4 mg/dL High (35/00/93 81:82:99 EST)    U Amphetamines Scrn: Negative (09/03/17 13:15:00 EST)    U Barbiturate Scrn: Negative (09/03/17 13:15:00 EST)    U Benzodiazepine Scrn: Negative (09/03/17 13:15:00 EST)    U Cannabinoid Scrn: Negative (09/03/17 13:15:00 EST)    U Cocaine Scrn: Negative (09/03/17 13:15:00 EST)    U Ethanol: <3 (09/03/17 13:15:00 EST)    U Ethanol Interp: Negative (09/03/17 13:15:00 EST)    U Methadone Scrn: Negative (09/03/17 13:15:00 EST)    U Opiate 300 Scrn: Negative (09/03/17 13:15:00 EST)    U Phencyclidine Scrn: Negative (09/03/17 13:15:00 EST)    Confirm Positive: Confirm Upon Request (09/03/17 13:15:00 EST)    WBC: 12 thous/mm3 High (09/03/17 13:15:00 EST)    RBC: 2.39 Mil/mm3 Low (09/03/17 13:15:00 EST)    Hgb: 8.6 Gm/dL Low (37/16/96 78:93:81 EST)    Hct: 27.4 % Low (09/03/17 13:15:00 EST)    Platelet: 290 thous/mm3 (09/03/17 13:15:00 EST)    MCV: 114.6 fL High (09/03/17 13:15:00 EST)    MCH: 36 pGm High (09/03/17 13:15:00 EST)    MCHC: 31.4 Gm/dL (01/75/10 25:85:27 EST)    RDW-SD: 78 fL High (09/03/17 13:15:00 EST)    MPV: 9.1 fL Low (09/03/17 13:15:00 EST)    Absolute Neutro Count Manual: 11.29 thous/mm3 High (09/03/17 13:15:00 EST)    Absolute Lymphs Count Manual: 0.48 thous/mm3 Low (09/03/17 13:15:00 EST)    Absolute Mono Count Manual: 0.24 thous/mm3 (09/03/17 13:15:00 EST)  Absolute Eos Count Manual: 0 thous/mm3 (09/03/17 13:15:00 EST)    Absolute Baso Count Manual: 0 thous/mm3 (09/03/17 13:15:00 EST)    Neutrophils Manual: 94 % (09/03/17 13:15:00 EST)    Lymphs Manual: 4 % (09/03/17 13:15:00 EST)    Monos Manual: 2 % (09/03/17 13:15:00 EST)    Eos Manual: 0 % (09/03/17 13:15:00 EST)    Basos Manual: 0 % (09/03/17 13:15:00 EST)    Reactive Lymphs Manual: 0 % (09/03/17 13:15:00 EST)    NRBC Percent: 0 % (09/03/17 13:15:00 EST)    Absolute NRBC Count: 0  thous/mm3 (09/03/17 13:15:00 EST)    RBC Morphology: See Below Abnormal (09/03/17 13:15:00 EST)    Polychrom: 1+ Abnormal (09/03/17 13:15:00 EST)    Anisocytosis: 2+ Abnormal (09/03/17 13:15:00 EST)    Macrocytes: 2+ Abnormal (09/03/17 13:15:00 EST)    Pappenh Bodies: Occasional Abnormal (09/03/17 13:15:00 EST)    Platelet Morph: Clumped Abnormal (09/03/17 13:15:00 EST)    UA Color: Amber1 (09/03/17 23:01:00 EST)    UA Clarity: Clear1 (09/03/17 23:01:00 EST)    UA Specific Gravity: 1.021 (09/03/17 23:01:00 EST)    UA pH: 5 (09/03/17 23:01:00 EST)    UA Protein: 30 Abnormal (09/03/17 23:01:00 EST)    UA Glucose: Negative1 (09/03/17 23:01:00 EST)    UA Ketones: Negative1 (09/03/17 23:01:00 EST)    UA Bilirubin: Negative1 (09/03/17 23:01:00 EST)    UA Blood: Small Abnormal (09/03/17 23:01:00 EST)    UA Urobilinogen: 4.0 Abnormal (09/03/17 23:01:00 EST)    UA Nitrite: Negative1 (09/03/17 23:01:00 EST)    UA Leukocyte Esterase: Negative1 (09/03/17 23:01:00 EST)    UA RBC: 4 /HPF High (09/03/17 23:01:00 EST)    UA WBC: 4 /HPF (09/03/17 23:01:00 EST)    UA Squamous Epithelial: 2 /HPF (09/03/17 23:01:00 EST)    UA Renal Epithelial: 1 /HPF High (09/03/17 23:01:00 EST)    UA Bacteria: Trace1 Abnormal (09/03/17 23:01:00 EST)    UA Mucous: Few (09/03/17 23:01:00 EST)    UA Hyaline Casts: 13 /LPF High (09/03/17 13:15:00 EST)    UA Amorphous Crys: Rare Abnormal (09/03/17 23:01:00 EST)    Specimen Site: RB (09/03/17 23:00:00 EST)    FiO2 Conc: FiO2 Conc (09/03/17 23:00:00 EST)    Comment (Blood Gas): room air (09/03/17 23:00:00 EST)    pH Arterial: 7.52 High (09/03/17 23:00:00 EST)    pCO2 Arterial: 34.8 mmHg Low (09/03/17 23:00:00 EST)    pO2 Arterial: 62.2 mmHg Low (09/03/17 23:00:00 EST)    HCO3 Arterial: 29 mmol/L High (09/03/17 23:00:00 EST)    Base Excess Arterial: 5.5 mmol/L High (09/03/17 23:00:00 EST)    O2 Sat Arterial: 94.4 % Low (09/03/17 23:00:00 EST)    PT: 13.3 sec High (09/03/17 13:15:00 EST)    INR: 1.3 (09/03/17  13:15:00 EST)    aPTT: 25 sec (09/03/17 13:15:00 EST)    Diagnostic Results        ------        SIGNATURE LINE Electronically signed by Layla Barter MD, Silveria Botz on 09/04/2017 at  01:25:30 EST

## 2017-09-03 NOTE — Progress Notes (Signed)
Subjective    no acute issues overnight    Review of Systems    Objective    Vitals & Measurements    **T:** 97.8 F (Oral) **TMIN:** 97.6 F (Oral) **TMAX:** 98.6 F (Oral)  **HR:** 82 (Peripheral Pulse) **RR:** 18 **BP:** 112/74 **SpO2:** 100% **O2  Method (L/min):** Room air    Physical Exam    General: upright in bed, not in acute distress, awake    Eye: Normal conjunctiva.    HENT: Normocephalic.    Respiratory: Lungs are clear to auscultation.    Cardiovascular: Normal rate, Regular rhythm, 1+ pitting edema to ankles  bilaterally.    Gastrointestinal: Mildly distended but soft, Non-tender, Normal bowel sounds;  no rebound or guarding    Integumentary: Warm, Dry, Numerous areas of excoriations on hands and feet.  Toes with dry wounds on all toes.    Neurologic: Alert, oriented to self, moving all extremities, no focal  deficits.    Medications    _Inpatient_    acetaminophen 650 mg rectal suppository, 650 mg= 2 tab(s), PO, q6hr    albuterol 2.5 mg/3 mL (0.083%) inhalation solution, 5 mg= 6 mL, NEB, q6hr, PRN    Ativan, 0.5 mg= 1 tab(s), PO, q6hr, PRN    Colace, 100 mg= 1 cap(s), PO, BID    Cymbalta, 60 mg= 2 cap(s), PO, Daily    folic acid, 1 mg= 1 tab(s), PO, Daily    heparin, 5000 Unit(s)= 1 mL, sc, q8hr    labetalol, 50 mg= 0.5 tab(s), PO, BID    lurasidone, 120 mg= 6 tab(s), PO, Daily    MiraLax oral powder for reconstitution, 17 Gm= 1 EA, PO, Daily, PRN    Multiple Vitamins oral liquid, 1 tab(s), PO, Daily    ondansetron, 4 mg= 2 mL, IV Push, q8hr, PRN    oxacillin    Protonix, 40 mg= 1 tab(s), PO, ACB    senna, 8.6 mg= 1 tab(s), PO, BID    traMADol, 50 mg= 1 tab(s), PO, q4hr, PRN    Lab Results    Blood Glucose POC: 121 mg/dL High (27/25/36 64:40:34 EDT)    Glucose Lvl: 81 mg/dL (74/25/95 63:87:56 EDT)    BUN: 13 mg/dL (43/32/95 18:84:16 EDT)    Creatinine: 1.21 mg/dL (60/63/01 60:10:93 EDT)    Afn Amer Glomerular Filtration Rate: 54 ml/min/1.45m2 (09/11/17 06:12:00 EDT)    Brent Bulla Amer Glomerular  Filtration Rate: 47 ml/min/1.38m2 (09/11/17 06:12:00  EDT)    Sodium Lvl: 142 mmol/L (09/11/17 06:12:00 EDT)    Potassium Lvl: 3.5 mmol/L Low (09/11/17 06:12:00 EDT)    Chloride: 111 mmol/L High (09/11/17 06:12:00 EDT)    CO2: 21 mmol/L (09/11/17 06:12:00 EDT)    Anion Gap: 10 (09/11/17 06:12:00 EDT)    Calcium Lvl: 7.2 mg/dL Low (23/55/73 22:02:54 EDT)    Phosphorus: 5.1 mg/dL High (27/06/23 76:28:31 EDT)    Magnesium Lvl: 2.2 mg/dL (51/76/16 07:37:10 EDT)    WBC: 7.8 thous/mm3 (09/11/17 06:12:00 EDT)    RBC: 2.19 Mil/mm3 Low (09/11/17 06:12:00 EDT)    Hgb: 7.6 Gm/dL Low (62/69/48 54:62:70 EDT)    Hct: 24.3 % Low (09/11/17 06:12:00 EDT)    Platelet: 327 thous/mm3 (09/11/17 06:12:00 EDT)    MCV: 111 fL High (09/11/17 06:12:00 EDT)    MCH: 34.7 pGm High (09/11/17 06:12:00 EDT)    MCHC: 31.3 Gm/dL (35/00/93 81:82:99 EDT)    RDW-SD: 80.7 fL High (09/11/17 06:12:00 EDT)    MPV: 9.4 fL (09/11/17 06:12:00 EDT)    NRBC Percent:  0 % (09/11/17 06:12:00 EDT)    Absolute NRBC Count: 0 thous/mm3 (09/11/17 06:12:00 EDT)    Diagnostic Results    Impression and Plan    Altered mental status    altered mental status    Sepsis    67 yo f w/ hx of HFpEF, COPD, chronic pain, falls, gastric bipass, bipolar dz,  presented with confusion after fall, fevers, AKI, sepsis now with MSSA  bacteremia with epidural and psoas abscesses, acute respiratory failure        #Toxic metabolic encephalopathy- due to severe sepsis/resp  failure/Hypernatremia    #?Benzodiazepine dependence    # severe sepsis-resolving    #Acute hypoxic + hypercapnic respiratory failure in the setting of severe  sepsis and altered mental status, resolved    #MSSA lumbar osteomyelitis, Epidural abscess, psoas abscess    #Severe hypokalemia, resolved    #Hypernatremia, resolved    #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 Hold off on PICC line until repeat  blood  cultures negative.    Nursing called microbiology lab and was told the results will return tomorrow    Per ICU, no area amenable to IR drainage. Consider repeat imaging if blood  cultures do not clear    Pulmonary toilet, avoid sedatives if possible. Aspiration precautions,  Incentive spirometry    Continue supportive care, reorient patient    Ativan when necessary for anxiety or withdrawal symptoms    Continue dysphagia diet. Continue blood glucose monitoring    Home atenolol was switched to labetalol        heparin for dvt ppx    SIGNATURE LINE Electronically signed by Abigail Miyamoto MD, Raeanne Deschler on 09/11/2017 at  13:39:07 EST

## 2017-09-03 NOTE — Discharge Summary (Signed)
Date of Admission    09/03/17    Date of Discharge    09/15/17    Admission History    Please see H and P at time of admission for details    Code Status    Code Status - Ordered    -- 09/03/17 22:09:00 EST, Full Resuscitation, Constant Order    Allergies    NKA    Social History    _Alcohol_    Current, 1-2 times per week    Past    _Substance Abuse_    Never    _Tobacco_    Former smoker, Cigarettes    Hospital Course    67 year old lady with history of heart failure preserved EF, COPD,  hypertension, chronic kidney disease, admitted with alteration of mental  status. Pt was lethargic and confused at presentation, head CT showed no acute  abnormality. She had an elevated lactic acid of 5 which improved after fluid  resuscitation. CT abdomen without contrast shows no acute abnormality.Did have  an elevated white count, is tachycardic, developed a fever, flu is negative.no  meningitic signs, chest x-ray shows no acute abnormality and he urine analysis  is negative. Empiric ABx and Acyclovir coverage for viral and bacterial  meningitis were started and LP was done. She was then seen by ICU for Severe  Sepsis, Gram positive bacteremia, Gram stain 1/4 bottles with GPC in clusters,  probable MSSA, was intubated for airway protection given declining clinical  status. Source of sepsis remained unclear and Id consult was appreciated. ?  entry point may be from multiple wounds on LE. Had a history of some LBP, ?  spinal osteomyelitis was raised which was then proven with spinal MRI.  Nuerosurg wa sconsulted and felt no surgical intervention was needed , [facility]  spine and T spine eng for any abscess higher up. She was then switched to  oxacillin and PICC line inserted for the ABX to eb adminitered for 6 weeks  from 09/08/17 ( blood c/s neg date)        She was extubated and continued treatment for lumbar spinal osteomyelitis,  epidural abscess, and psoas abscess with Iv oxacillin. TTE without clear  endocarditis. She is being  discharge din stable condition to rehab to finish  her ABX.HCP has been invoked this admission      Procedures and Treatment Provided    MRI spine : Small abscess in the right psoas muscle at the level of L3 and L4  with likely    early mild discitis/osteomyelitis at L1-2 on the right side.    Possible but not definite very small epidural abscess in the right anterior    epidural space at level of L2.    Likely left psoas myositis without an abscess.    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      Physical Exam    Vitals & Measurements    **T:** 97.7 F (Oral) **TMIN:** 97.7 F (Oral) **TMAX:** 99 F (Axillary)  **HR:** 86 (Peripheral Pulse) **RR:** 19 **BP:** 127/78 **SpO2:** 100% **O2  Method (L/min):** Room air    General: sitting in chair, not in acute distress, awake    Eye: Normal conjunctiva.    HENT: Normocephalic.    Respiratory: Lungs are clear to auscultation.    Cardiovascular: Normal rate, Regular rhythm, 1+ pitting edema to ankles  bilaterally.    Gastrointestinal: Mildly distended but soft,  Non-tender, Normal bowel sounds;  no rebound or guarding    Integumentary: Warm, Dry, Numerous areas of excoriations on hands and feet.  Toes with dry wounds on all toes.    Neurologic: Alert, oriented to self, moving all extremities, no focal deficits    Lab Results    Glucose Lvl: 93 mg/dL (16/10/96 04:54:09 EDT)    BUN: 17 mg/dL (81/19/14 78:29:56 EDT)    Creatinine: 1.57 mg/dL High (21/30/86 57:84:69 EDT)    Afn Amer Glomerular Filtration Rate: 40 ml/min/1.42m2 (09/15/17 06:00:00 EDT)    Brent Bulla Amer Glomerular Filtration Rate: 34 ml/min/1.21m2 (09/15/17 06:00:00  EDT)    Sodium Lvl: 139 mmol/L (09/15/17 06:00:00 EDT)    Potassium Lvl: 3.2 mmol/L Low (09/15/17 06:00:00 EDT)    Chloride: 108 mmol/L (09/15/17 06:00:00 EDT)    CO2: 19 mmol/L Low (09/15/17 06:00:00 EDT)    Anion Gap: 12 High (09/15/17 06:00:00 EDT)    Calcium Lvl: 7.1 mg/dL Low  (62/95/28 41:32:44 EDT)    Phosphorus: 4.9 mg/dL (07/06/70 53:66:44 EDT)    Magnesium Lvl: 2.1 mg/dL (03/47/42 59:56:38 EDT)    WBC: 11.9 thous/mm3 High (09/15/17 05:47:00 EDT)    RBC: 2.98 Mil/mm3 Low (09/15/17 05:47:00 EDT)    Hgb: 9.9 Gm/dL Low (75/64/33 29:51:88 EDT)    Hct: 29.6 % Low (09/15/17 05:47:00 EDT)    Platelet: 410 thous/mm3 High (09/15/17 05:47:00 EDT)    MCV: 99.3 fL (09/15/17 05:47:00 EDT)    MCH: 33.2 pGm (09/15/17 05:47:00 EDT)    MCHC: 33.4 Gm/dL (41/66/06 30:16:01 EDT)    RDW-SD: 86.7 fL High (09/15/17 05:47:00 EDT)    MPV: 8.9 fL Low (09/15/17 05:47:00 EDT)    NRBC Percent: 0 % (09/15/17 05:47:00 EDT)    Absolute NRBC Count: 0 thous/mm3 (09/15/17 05:47:00 EDT)    Discharge Diagnoses    67 yo f w/ hx of HFpEF, COPD, chronic pain, falls, gastric bipass, bipolar dz,  presented with confusion after fall, fevers, AKI, sepsis now with MSSA  bacteremia with epidural and psoas abscesses, acute respiratory failure        #Toxic metabolic encephalopathy- due to severe sepsis/resp  failure/Hypernatremia, underlying bipolar, back to baseline    #?Benzodiazepine dependence- appreciate psych eval    # severe sepsis-resolved sec to lumbar OM , psoas abscess on oxacillin    #Acute hypoxic + hypercapnic respiratory failure in the setting of severe  sepsis and altered mental status, resolved    #MSSA lumbar osteomyelitis, Epidural abscess, psoas abscess, oxacillin for 6  weeks from 09/08/17    #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 dilutional. stable now        Continue dysphagia diet. Continue blood glucose monitoring    Home atenolol was switched to labetalol    will activate HCP given pt not able to make decisions    heparin for dvt ppx    Discharge Medications    _Discharge_    aluminum hydroxide/magnesium hydroxide/simethicone 200 mg-200 mg-20 mg/5 mL  oral suspension, 30 mL, PO, q4hr,  PRN    amitriptyline, 100 mg, PO, HS    Breo Ellipta 100 mcg-25 mcg/inh inhalation powder, 1 puff(s), INHAL, Daily    Cymbalta 60 mg oral delayed release capsule, 60 mg= 1 cap(s), PO, Daily    docusate sodium 100 mg oral capsule, 100 mg= 1 cap(s), PO, BID, PRN, 2  refills    ferrous gluconate 325 mg (36 mg elemental iron) oral tablet, 325 mg= 1 tab(s),  PO, BID    folic acid 1 mg oral tablet, 1 mg= 1 tab(s), PO, Daily    labetalol 100 mg oral tablet, 50 mg= 0.5 tab(s), PO, BID    Lasix 40 mg oral tablet, 40 mg, PO, Daily, 2 refills, **Not taking**    Latuda, 120 mg, PO, HS, evening    LORazepam 0.5 mg oral tablet, 0.5 mg= 1 tab(s), PO, q6hr, PRN    OLANZapine 5 mg oral tablet, disintegrating, 5 mg= 1 tab(s), PO, Daily    oxacillin 2 g injection, 2000 mg, IV, q4hr    Protonix, 40 mg, PO, BID    senna 8.6 mg oral tablet, 8.6 mg= 1 tab(s), PO, BID    Spiriva 18 mcg inhalation capsule, 18 mcg= 1 cap(s), INHAL, Daily    traMADol 50 mg oral tablet, 50 mg= 1 tab(s), PO, q4hr, PRN    Vitamin B12 250 mcg oral tablet, 250 mcg= 1 tab(s), PO, Daily    Vitamin D3, 1000 Unit(s), PO, Daily    Discharge Instructions    Please follwo up with PCP, ID    Counseling    Face to Face    total time of discharge 45 min        Face to Face Encounter: Based on my findings, the following services are  medically necessary home health services.    I had a face-to-face encounter with this patient during which a medical  condition was addressed which is the primary reason for home health care on:  _ / _ /_.    Based on my findings, the following services are medically necessary home  health services: Nursing, Physical Therapy, Occupational Therapy, Speech  Therapy.        Skilled nursing service which includes assessment, teaching and intervention:  Wound Care, IV Therapy, Medication Management and Education, Disease  Management, Symptom Control, Ostomy Care and Teaching, Post-Surgical Care,  Catheter Care, Enteral Therapy Teaching, Injectable  Medication.        Skilled Soil scientist which includes assessment, teaching and  intervention -PT/OT/ST: Therapeutic Exercise, Gait Training, ROM, Home  Exercise Program, Home Safety Evaluation, Assessment of DME and Adaptive  Equipment, Modalities [e.g., E Stim, Ultrasound], Pain and Symptom Management,  Orthopedic Rehabilitation Following Surgery, Speech and Swallowing Disorders,  Dysphagia Treatment, Fall Risk, Restore Patient Function.        Homebound (i.e. absences from home require considerable and taxing effort and  are for medical reason or religious services or infrequently or of short  duration when for other reasons): Unsafe Ambulation, Gait Disorder, Dyspnea  with Minimal Exertion, Low Vision, Uncontrolled Pain Interfering with  Activity, Frequent Falls, Unable to [employer], Use of Supportive Device  [Cane, Walker], Complex Wound, Psychiatric Symptoms, Cognitive Deficits,  Memory Impairments.        Based on the above findings, I certify that this patient is confined to the  home and needs intermittent skilled nursing care, physical therapy and /or  speech therapy or continues to need occupational therapy: The patient is under  my care, and I have initiated the establishment of the plan of care. This  patient will be followed by a physician who will periodically review the plan  of care.      SIGNATURE LINE Electronically signed by Mckynlie Vanderslice MD, Harless Litten on 09/15/2017 at  11:15:26 EST

## 2017-09-03 NOTE — Progress Notes (Signed)
Subjective    patient is awake ,confused, not following my commands        PO intake is reduced per nursing        Repeat blood cx drawn today    Review of Systems    Objective    Vitals & Measurements    **T:** 98.3 F (Oral) **TMIN:** 96.5 F (Axillary) **TMAX:** 98.3 F (Oral)  **HR:** 68 **RR:** 19 **BP:** 130/103 **SpO2:** 100% **O2 Rate:** 2 **O2  Method (L/min):** Nasal cannula **WT:** 77 Kg    Physical Exam    General: upright in bed, not in acute distress, awake, does not follow simple  commands    Eye: Normal conjunctiva.    HENT: Normocephalic.    Respiratory: Lungs are clear to auscultation.    Cardiovascular: Normal rate, Regular rhythm, 1+ pitting edema to hands and  ankles bilaterally.    Gastrointestinal: Mildly distended but soft, Non-tender, Normal bowel sounds;  no rebound or guarding    Integumentary: Warm, Dry, Numerous areas of excoriations on hands and feet.  Toes with dry wounds on all toes.    Neurologic: Alert, oriented to self, moving all extremities, no focal  deficits.    [1]    Medications    _Inpatient_    acetaminophen 650 mg rectal suppository, 650 mg= 2 tab(s), PO, q6hr    albuterol 2.5 mg/3 mL (0.083%) inhalation solution, 5 mg= 6 mL, NEB, q6hr, PRN    Ativan, 0.5 mg= 1 tab(s), PO, q6hr, PRN    Colace, 100 mg= 1 cap(s), PO, BID    Cymbalta, 60 mg= 2 cap(s), PO, Daily    folic acid, 1 mg= 1 tab(s), PO, Daily    heparin, 5000 Unit(s)= 1 mL, sc, q8hr    labetalol, 50 mg= 0.5 tab(s), PO, BID    lurasidone, 120 mg= 6 tab(s), PO, Daily    MiraLax oral powder for reconstitution, 17 Gm= 1 EA, PO, Daily, PRN    Multiple Vitamins oral liquid, 1 tab(s), PO, Daily    ondansetron, 4 mg= 2 mL, IV Push, q8hr, PRN    oxacillin    Potassium Chloride IV, 10 mEq= 100 mL, IV Piggyback, q1hr    Protonix, 40 mg= 1 tab(s), PO, ACB    senna, 8.6 mg= 1 tab(s), PO, BID    traMADol, 50 mg= 1 tab(s), PO, q4hr, PRN    Lab Results    Glucose Lvl: 81 mg/dL (13/08/65 78:46:96 EST)    BUN: 13 mg/dL  (29/52/84 13:24:40 EST)    Creatinine: 1.1 mg/dL (04/30/24 36:64:40 EST)    Afn Amer Glomerular Filtration Rate: 61 ml/min/1.34m2 (09/08/17 06:12:00 EST)    Non-Afn Amer Glomerular Filtration Rate: 53 ml/min/1.53m2 (09/08/17 06:12:00  EST)    Sodium Lvl: 144 mmol/L (09/08/17 06:12:00 EST)    Potassium Lvl: 2.7 mmol/L Critical (09/08/17 06:12:00 EST)    Chloride: 110 mmol/L (09/08/17 06:12:00 EST)    CO2: 23 mmol/L (09/08/17 06:12:00 EST)    Anion Gap: 11 (09/08/17 06:12:00 EST)    Calcium Lvl: 7.3 mg/dL Low (34/74/25 95:63:87 EST)    Phosphorus: 3.9 mg/dL (56/43/32 95:18:84 EST)    Magnesium Lvl: 2.1 mg/dL (16/60/63 01:60:10 EST)    Iron: 43 mcg/dL (93/23/55 73:22:02 EST)    TIBC: 33 mcg/dL Low (54/27/06 23:76:28 EST)    % Iron Bound: >100 High (09/08/17 06:12:00 EST)    Ferritin Lvl: 1989 nGm/ml High (09/08/17 06:12:00 EST)    WBC: 6 thous/mm3 (09/08/17 06:12:00 EST)    RBC: 2.22 Mil/mm3 Low (  09/08/17 06:12:00 EST)    Hgb: 7.7 Gm/dL Low (82/95/62 13:08:65 EST)    Hct: 24 % Low (09/08/17 06:12:00 EST)    Platelet: 149 thous/mm3 Low (09/08/17 06:12:00 EST)    MCV: 108.1 fL High (09/08/17 06:12:00 EST)    MCH: 34.7 pGm High (09/08/17 06:12:00 EST)    MCHC: 32.1 Gm/dL (78/46/96 29:52:84 EST)    RDW-SD: 78.3 fL High (09/08/17 06:12:00 EST)    MPV: 10.1 fL (09/08/17 06:12:00 EST)    NRBC Percent: 0 % (09/08/17 06:12:00 EST)    Absolute NRBC Count: 0 thous/mm3 (09/08/17 06:12:00 EST)            XR Abd 03/07        IMPRESSION:    1\. Mild air distention of the colon. Limited small bowel assessment without    evidence of obstruction.    2\. Otherwise limited study with chronic/incidental findings as above.    [2]    Diagnostic Results    Impression and Plan    Altered mental status            67 yo f w/ hx of HFpEF, COPD, chronic pain, falls, gastric bipass, bipolar dz,  presented with confusion after fall, fevers, AKI, sepsis now with MSSA  bacteremia with epidural and psoas abscesses, acute respiratory failure         #Toxic metabolic encephalopathy- due to severe sepsis/resp  failure/Hypernatremia    #?Benzodiazepine dependence    # severe sepsis-resolving    #Acute hypoxic + hypercapnic respiratory failure in the setting of severe  sepsis and altered mental status, resolved    #MSSA lumbar osteomyelitis, Epidural abscess, psoas abscess    #Severe hypokalemia    #Hypernatremia, resolved    #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 Blood cultures have been positive.  Repeat blood cultures drawn today. Hold off on PICC line until blood cultures  negative for 48 hours    Per ICU, no area amenable to IR drainage. Consider repeat imaging blood  cultures do not clear    Pulmonary toilet, avoid sedatives if possible. Aspiration precautions,  Incentive spirometry    Continue supportive care, reorient patient    Ativan when necessary for anxiety or withdrawal symptoms    Continue dysphagia diet. Will consider IV hydration if oral intake remains  poor. Continue blood glucose monitoring    Replete potassium. Recheck level in 4-6 hours. Continue cardiac monitoring    Home atenolol switched to labetalol        Will update sister Dorthula Rue who lives in [place]. Any further  procedures/interventions will require activation of HCP as patient currently  does not have capacity to make decisions.            DVT prophylaxis heparin    PPI for GI prophylaxis      [1] ICU Progress Note *; Dunion Remi Haggard 09/07/2017 11:31 EST    [2] XR Abdomen 1 View; Everardo Beals MD, Casimiro Needle 09/08/2017 10:32 EST    [3] ICU Progress Note *; Dunion Remi Haggard 09/07/2017 11:31 EST    SIGNATURE LINE Electronically signed by Allena Katz MD, Hy Swiatek on 09/08/2017 at  11:17:48 EST

## 2017-09-04 LAB — HX BASIC METABOLIC PANEL
CASE NUMBER: 2019062000278
CASE NUMBER: 17629410
CASE NUMBER: 17629893
HX ANION GAP: 9 — NL (ref 3.0–11.0)
HX ANION GAP: 7 — NL (ref 3.0–11.0)
HX ANION GAP: 7 — NL (ref 3.0–11.0)
HX BUN: 21 mg/dL — NL (ref 8.0–23.0)
HX BUN: 21 mg/dL — NL (ref 8.0–23.0)
HX BUN: 18 mg/dL — NL (ref 8.0–23.0)
HX CALCIUM LVL: 7.5 mg/dL — ABNORMAL LOW (ref 8.5–10.5)
HX CALCIUM LVL: 7.2 mg/dL — ABNORMAL LOW (ref 8.5–10.5)
HX CALCIUM LVL: 7.3 mg/dL — ABNORMAL LOW (ref 8.5–10.5)
HX CHLORIDE: 112 mmol/L — ABNORMAL HIGH (ref 98.0–110.0)
HX CHLORIDE: 115 mmol/L — ABNORMAL HIGH (ref 98.0–110.0)
HX CHLORIDE: 114 mmol/L — ABNORMAL HIGH (ref 98.0–110.0)
HX CO2: 27 mmol/L — NL (ref 21.0–32.0)
HX CO2: 25 mmol/L — NL (ref 21.0–32.0)
HX CO2: 25 mmol/L — NL (ref 21.0–32.0)
HX CREATININE: 1.37 mg/dL — ABNORMAL HIGH (ref 0.55–1.3)
HX CREATININE: 1.41 mg/dL — ABNORMAL HIGH (ref 0.55–1.3)
HX CREATININE: 1.29 mg/dL — NL (ref 0.55–1.3)
HX GLUCOSE LVL: 145 mg/dL — ABNORMAL HIGH (ref 70.0–110.0)
HX GLUCOSE LVL: 127 mg/dL — ABNORMAL HIGH (ref 70.0–110.0)
HX GLUCOSE LVL: 134 mg/dL — ABNORMAL HIGH (ref 70.0–110.0)
HX POTASSIUM LVL: 3.1 mmol/L — ABNORMAL LOW (ref 3.6–5.2)
HX POTASSIUM LVL: 5 mmol/L — NL (ref 3.6–5.2)
HX POTASSIUM LVL: 3.9 mmol/L — NL (ref 3.6–5.2)
HX SODIUM LVL: 148 mmol/L — ABNORMAL HIGH (ref 136.0–146.0)
HX SODIUM LVL: 147 mmol/L — ABNORMAL HIGH (ref 136.0–146.0)
HX SODIUM LVL: 146 mmol/L — NL (ref 136.0–146.0)

## 2017-09-04 LAB — HX CBC W/ DIFF
CASE NUMBER: 2019062000278
HX ABSOLUTE NRBC COUNT: 0 10*3/uL
HX HCT: 21.1 % — ABNORMAL LOW (ref 36.0–47.0)
HX HGB: 6.5 g/dL — CR (ref 11.8–16.0)
HX MCH: 35.7 pg — ABNORMAL HIGH (ref 26.0–34.0)
HX MCHC: 30.8 g/dL — ABNORMAL LOW (ref 31.0–37.0)
HX MCV: 115.9 fL — ABNORMAL HIGH (ref 80.0–100.0)
HX MPV: 9.7 fL — NL (ref 9.4–12.4)
HX NRBC PERCENT: 0 % — NL
HX PLATELET: 187 10*3/uL — NL (ref 150.0–400.0)
HX RBC: 1.82 10*6/uL — ABNORMAL LOW (ref 3.9–5.2)
HX RDW-CV: 18.7 % — ABNORMAL HIGH (ref 11.5–14.5)
HX RDW-SD: 78.5 fL — ABNORMAL HIGH (ref 35.0–51.0)
HX WBC: 4 10*3/uL — NL (ref 3.7–11.2)

## 2017-09-04 LAB — HX HEPATIC FUNCTION PANEL
CASE NUMBER: 17628932
HX ALBUMIN LVL: 1.7 g/dL — ABNORMAL LOW (ref 3.2–5.0)
HX ALKALINE PHOSPHATASE: 138 U/L — ABNORMAL HIGH (ref 30.0–117.0)
HX ALT: 31 U/L — NL (ref 6.0–55.0)
HX AST: 84 U/L — ABNORMAL HIGH (ref 6.0–40.0)
HX BILIRUBIN DIRECT: 1.1 mg/dL — ABNORMAL HIGH (ref 0.0–0.3)
HX BILIRUBIN TOTAL: 1.3 mg/dL — ABNORMAL HIGH (ref 0.2–1.2)
HX TOTAL PROTEIN: 6 g/dL — NL (ref 6.0–8.4)

## 2017-09-04 LAB — HX CBC W/ INDICES
CASE NUMBER: 17629410
HX ABSOLUTE NRBC COUNT: 0 10*3/uL
HX HCT: 26.2 % — ABNORMAL LOW (ref 36.0–47.0)
HX HGB: 8.3 g/dL — ABNORMAL LOW (ref 11.8–16.0)
HX MCH: 35.8 pg — ABNORMAL HIGH (ref 26.0–34.0)
HX MCHC: 31.7 g/dL — NL (ref 31.0–37.0)
HX MCV: 112.9 fL — ABNORMAL HIGH (ref 80.0–100.0)
HX MPV: 9.8 fL — NL (ref 9.4–12.4)
HX NRBC PERCENT: 0 % — NL
HX PLATELET: 175 10*3/uL — NL (ref 150.0–400.0)
HX RBC: 2.32 10*6/uL — ABNORMAL LOW (ref 3.9–5.2)
HX RDW-CV: 21.1 % — ABNORMAL HIGH (ref 11.5–14.5)
HX RDW-SD: 86.7 fL — ABNORMAL HIGH (ref 35.0–51.0)
HX WBC: 5.9 10*3/uL — NL (ref 3.7–11.2)

## 2017-09-04 LAB — HX TROPONIN I
CASE NUMBER: 2019062000194
CASE NUMBER: 2019062000380
HX TROPONIN I: 0.024 ng/mL — NL (ref 0.015–0.045)
HX TROPONIN I: 0.024 ng/mL — NL (ref 0.015–0.045)

## 2017-09-04 LAB — HX ANTIBODY SCREEN
CASE NUMBER: 2019062000278
HX ANTIBODY SCREEN AUTOMATED: NEGATIVE — NL

## 2017-09-04 LAB — HX BLOOD GAS ARTERIAL
CASE NUMBER: 2019062000520
CASE NUMBER: 2019062000653
CASE NUMBER: 2019062001329
HX BASE EXCESS ARTERIAL: 0.6 mmol/L — NL (ref <=2.0)
HX BASE EXCESS ARTERIAL: 1.9 mmol/L — NL (ref <=2.0)
HX BASE EXCESS ARTERIAL: -0.3 mmol/L — NL (ref <=2.0)
HX FIO2 CONC: 28
HX FIO2 CONC: 30
HX FIO2 CONC: 30
HX HCO3 ARTERIAL: 25.4 mmol/L — NL (ref 22.0–26.0)
HX HCO3 ARTERIAL: 26.1 mmol/L — ABNORMAL HIGH (ref 22.0–26.0)
HX HCO3 ARTERIAL: 24.4 mmol/L — NL (ref 22.0–26.0)
HX O2 SAT ARTERIAL: 98.9 % — NL (ref 95.0–99.0)
HX O2 SAT ARTERIAL: 97.5 % — NL (ref 95.0–99.0)
HX O2 SAT ARTERIAL: 99.4 % — ABNORMAL HIGH (ref 95.0–99.0)
HX PCO2 ARTERIAL: 25.5 mmHg — ABNORMAL LOW (ref 35.0–45.0)
HX PCO2 ARTERIAL: 31.8 mmHg — ABNORMAL LOW (ref 35.0–45.0)
HX PCO2 ARTERIAL: 30.9 mmHg — ABNORMAL LOW (ref 35.0–45.0)
HX PH ARTERIAL: 7.56 — ABNORMAL HIGH (ref 7.35–7.45)
HX PH ARTERIAL: 7.5 — ABNORMAL HIGH (ref 7.35–7.45)
HX PH ARTERIAL: 7.48 — ABNORMAL HIGH (ref 7.35–7.45)
HX PO2 ARTERIAL: 104 mmHg — ABNORMAL HIGH (ref 80.0–100.0)
HX PO2 ARTERIAL: 80.5 mmHg — NL (ref 80.0–100.0)
HX PO2 ARTERIAL: 111 mmHg — ABNORMAL HIGH (ref 80.0–100.0)

## 2017-09-04 LAB — HX ABO/RH TYPE
CASE NUMBER: 2019062000278
HX ABO/RH TYPE: A POS — NL

## 2017-09-04 LAB — HX .MANUAL DIFF
CASE NUMBER: 2019062000278
HX ABSOLUTE BASO COUNT MANUAL: 0 10*3/uL — NL (ref 0.0–0.22)
HX ABSOLUTE EOS COUNT MANUAL: 0 10*3/uL — NL (ref 0.0–0.45)
HX ABSOLUTE LYMPHS COUNT MANUAL: 0.24 10*3/uL — ABNORMAL LOW (ref 0.74–5.04)
HX ABSOLUTE MONO COUNT MANUAL: 0.24 10*3/uL — NL (ref 0.0–1.34)
HX ABSOLUTE NEUTRO COUNT MANUAL: 3.4 10*3/uL — NL (ref 1.48–7.95)
HX BASOS MANUAL: 0 %
HX EOS MANUAL: 0 %
HX LYMPHS MANUAL: 6 %
HX METAS MANUAL: 4 % — ABNORMAL HIGH (ref 0.0–1.0)
HX MONOS MANUAL: 6 %
HX NEUTROPHILS MANUAL: 84 %
HX REACTIVE LYMPHS MANUAL: 0 %

## 2017-09-04 LAB — HX LACTIC ACID
CASE NUMBER: 2019062000560
CASE NUMBER: 17629410
CASE NUMBER: 2019062001116
HX LACTIC ACID LVL: 4.9 mmol/L — CR (ref 0.4–2.0)
HX LACTIC ACID LVL: 3.9 mmol/L — ABNORMAL HIGH (ref 0.4–2.0)
HX LACTIC ACID LVL: 3.4 mmol/L — ABNORMAL HIGH (ref 0.4–2.0)

## 2017-09-04 LAB — HX GLOMERULAR FILTRATION RATE (ESTIMATED)
CASE NUMBER: 2019062000278
HX AFN AMER GLOMERULAR FILTRATION RATE: 47 mL/min/{1.73_m2}
HX NON-AFN AMER GLOMERULAR FILTRATION RATE: 40 mL/min/{1.73_m2}

## 2017-09-04 LAB — HX .REDCELL
CASE NUMBER: 2019062000513
HX ADDITIONAL UNITS TO CROSSMATCH: 0 — NL
HX NUMBER OF UNITS TO TRANSFUSE: 1

## 2017-09-04 LAB — HX MAGNESIUM LEVEL
CASE NUMBER: 2019062001114
HX MAGNESIUM LVL: 2.2 mg/dL — NL (ref 1.7–2.5)

## 2017-09-04 LAB — HX SST GOLD TUBE TO HOLD: CASE NUMBER: 2019062000560

## 2017-09-04 LAB — HX LAVENDER TOP TO HOLD: CASE NUMBER: 2019062000560

## 2017-09-04 LAB — HX CREATINE KINASE
CASE NUMBER: 17629535
HX TOTAL CK: 236 U/L — ABNORMAL HIGH (ref 29.0–143.0)

## 2017-09-04 LAB — HX GRAY TUBE TO HOLD: CASE NUMBER: 2019062000560

## 2017-09-04 LAB — HX TRIGLYCERIDES
CASE NUMBER: 2019062000560
HX TRIG: 168 mg/dL — ABNORMAL HIGH

## 2017-09-04 LAB — HX BB TUBE TO HOLD: CASE NUMBER: 2019062000560

## 2017-09-04 LAB — HX CROSSMATCH: CASE NUMBER: 2019062000278

## 2017-09-05 ENCOUNTER — Ambulatory Visit

## 2017-09-05 LAB — HX CBC W/ INDICES
CASE NUMBER: 2019063000602
HX ABSOLUTE NRBC COUNT: 0 10*3/uL
HX HCT: 24.3 % — ABNORMAL LOW (ref 36.0–47.0)
HX HGB: 8 g/dL — ABNORMAL LOW (ref 11.8–16.0)
HX MCH: 35.7 pg — ABNORMAL HIGH (ref 26.0–34.0)
HX MCHC: 32.9 g/dL — NL (ref 31.0–37.0)
HX MCV: 108.5 fL — ABNORMAL HIGH (ref 80.0–100.0)
HX MPV: 9.8 fL — NL (ref 9.4–12.4)
HX NRBC PERCENT: 0 % — NL
HX PLATELET: 120 10*3/uL — ABNORMAL LOW (ref 150.0–400.0)
HX RBC: 2.24 10*6/uL — ABNORMAL LOW (ref 3.9–5.2)
HX RDW-CV: 21 % — ABNORMAL HIGH (ref 11.5–14.5)
HX RDW-SD: 83.4 fL — ABNORMAL HIGH (ref 35.0–51.0)
HX WBC: 4 10*3/uL — NL (ref 3.7–11.2)

## 2017-09-05 LAB — HX BASIC METABOLIC PANEL
CASE NUMBER: 2019063000086
CASE NUMBER: 2019063000087
CASE NUMBER: 2019063000802
CASE NUMBER: 2019063002068
HX ANION GAP: 5 — NL (ref 3.0–11.0)
HX ANION GAP: 7 — NL (ref 3.0–11.0)
HX ANION GAP: 7 — NL (ref 3.0–11.0)
HX ANION GAP: 9 — NL (ref 3.0–11.0)
HX BUN: 16 mg/dL — NL (ref 8.0–23.0)
HX BUN: 17 mg/dL — NL (ref 8.0–23.0)
HX BUN: 18 mg/dL — NL (ref 8.0–23.0)
HX BUN: 18 mg/dL — NL (ref 8.0–23.0)
HX CALCIUM LVL: 7.3 mg/dL — ABNORMAL LOW (ref 8.5–10.5)
HX CALCIUM LVL: 7.3 mg/dL — ABNORMAL LOW (ref 8.5–10.5)
HX CALCIUM LVL: 7.5 mg/dL — ABNORMAL LOW (ref 8.5–10.5)
HX CALCIUM LVL: 7.6 mg/dL — ABNORMAL LOW (ref 8.5–10.5)
HX CHLORIDE: 113 mmol/L — ABNORMAL HIGH (ref 98.0–110.0)
HX CHLORIDE: 114 mmol/L — ABNORMAL HIGH (ref 98.0–110.0)
HX CHLORIDE: 114 mmol/L — ABNORMAL HIGH (ref 98.0–110.0)
HX CHLORIDE: 115 mmol/L — ABNORMAL HIGH (ref 98.0–110.0)
HX CO2: 23 mmol/L — NL (ref 21.0–32.0)
HX CO2: 24 mmol/L — NL (ref 21.0–32.0)
HX CO2: 25 mmol/L — NL (ref 21.0–32.0)
HX CO2: 26 mmol/L — NL (ref 21.0–32.0)
HX CREATININE: 0.997 mg/dL — NL (ref 0.55–1.3)
HX CREATININE: 1.01 mg/dL — NL (ref 0.55–1.3)
HX CREATININE: 1.04 mg/dL — NL (ref 0.55–1.3)
HX CREATININE: 1.15 mg/dL — NL (ref 0.55–1.3)
HX GLUCOSE LVL: 102 mg/dL — NL (ref 70.0–110.0)
HX GLUCOSE LVL: 103 mg/dL — NL (ref 70.0–110.0)
HX GLUCOSE LVL: 84 mg/dL — NL (ref 70.0–110.0)
HX GLUCOSE LVL: 92 mg/dL — NL (ref 70.0–110.0)
HX POTASSIUM LVL: 3.5 mmol/L — ABNORMAL LOW (ref 3.6–5.2)
HX POTASSIUM LVL: 3.9 mmol/L — NL (ref 3.6–5.2)
HX POTASSIUM LVL: 4 mmol/L — NL (ref 3.6–5.2)
HX POTASSIUM LVL: 4.5 mmol/L — NL (ref 3.6–5.2)
HX SODIUM LVL: 144 mmol/L — NL (ref 136.0–146.0)
HX SODIUM LVL: 145 mmol/L — NL (ref 136.0–146.0)
HX SODIUM LVL: 146 mmol/L — NL (ref 136.0–146.0)
HX SODIUM LVL: 147 mmol/L — ABNORMAL HIGH (ref 136.0–146.0)

## 2017-09-05 LAB — HX BLOOD GAS ARTERIAL
CASE NUMBER: 2019063000260
HX BASE EXCESS ARTERIAL: 2.6 mmol/L — ABNORMAL HIGH (ref <=2.0)
HX FIO2 CONC: 30
HX HCO3 ARTERIAL: 26.8 mmol/L — ABNORMAL HIGH (ref 22.0–26.0)
HX O2 SAT ARTERIAL: 99 % — NL (ref 95.0–99.0)
HX PCO2 ARTERIAL: 32.3 mmHg — ABNORMAL LOW (ref 35.0–45.0)
HX PH ARTERIAL: 7.51 — ABNORMAL HIGH (ref 7.35–7.45)
HX PO2 ARTERIAL: 103 mmHg — ABNORMAL HIGH (ref 80.0–100.0)

## 2017-09-05 LAB — HX GLOMERULAR FILTRATION RATE (ESTIMATED)
CASE NUMBER: 2019063000086
HX AFN AMER GLOMERULAR FILTRATION RATE: 58 mL/min/{1.73_m2}
HX NON-AFN AMER GLOMERULAR FILTRATION RATE: 50 mL/min/{1.73_m2}

## 2017-09-05 LAB — HX LACTIC ACID
CASE NUMBER: 2019063000086
CASE NUMBER: 2019063000087
CASE NUMBER: 2019063000986
HX LACTIC ACID LVL: 1 mmol/L — NL (ref 0.4–2.0)
HX LACTIC ACID LVL: 1.1 mmol/L — NL (ref 0.4–2.0)
HX LACTIC ACID LVL: 3.2 mmol/L — ABNORMAL HIGH (ref 0.4–2.0)

## 2017-09-05 LAB — HX CREATINE KINASE
CASE NUMBER: 2019063000087
HX TOTAL CK: 77 U/L — NL (ref 29.0–143.0)

## 2017-09-05 LAB — HX MAGNESIUM LEVEL
CASE NUMBER: 2019063000086
CASE NUMBER: 2019063000087
CASE NUMBER: 2019063000802
CASE NUMBER: 2019063002068
HX MAGNESIUM LVL: 2.2 mg/dL — NL (ref 1.7–2.5)
HX MAGNESIUM LVL: 2.2 mg/dL — NL (ref 1.7–2.5)
HX MAGNESIUM LVL: 2.2 mg/dL — NL (ref 1.7–2.5)
HX MAGNESIUM LVL: 2.2 mg/dL — NL (ref 1.7–2.5)

## 2017-09-05 LAB — HX LAVENDER TOP TO HOLD: CASE NUMBER: 2019063002068

## 2017-09-05 LAB — HX HEPATIC FUNCTION PANEL
CASE NUMBER: 2019063000087
HX ALBUMIN LVL: 1.5 g/dL — ABNORMAL LOW (ref 3.2–5.0)
HX ALKALINE PHOSPHATASE: 140 U/L — ABNORMAL HIGH (ref 30.0–117.0)
HX ALT: 51 U/L — NL (ref 6.0–55.0)
HX AST: 187 U/L — ABNORMAL HIGH (ref 6.0–40.0)
HX BILIRUBIN DIRECT: 1.7 mg/dL — ABNORMAL HIGH (ref 0.0–0.3)
HX BILIRUBIN TOTAL: 1.9 mg/dL — ABNORMAL HIGH (ref 0.2–1.2)
HX TOTAL PROTEIN: 4.7 g/dL — ABNORMAL LOW (ref 6.0–8.4)

## 2017-09-05 LAB — HX GREEN TUBE TO HOLD: CASE NUMBER: 2019063000602

## 2017-09-05 LAB — HX VANCOMYCIN LEVEL TROUGH
CASE NUMBER: 2019063000986
HX VANCOMYCIN TROUGH: 10.8 ug/mL — NL (ref 10.0–20.0)

## 2017-09-05 LAB — HX BLUE TOP TO HOLD: CASE NUMBER: 2019063002068

## 2017-09-06 LAB — HX GLOMERULAR FILTRATION RATE (ESTIMATED)
CASE NUMBER: 2019064000096
HX AFN AMER GLOMERULAR FILTRATION RATE: 69 mL/min/{1.73_m2}
HX NON-AFN AMER GLOMERULAR FILTRATION RATE: 59 mL/min/{1.73_m2}

## 2017-09-06 LAB — HX BASIC METABOLIC PANEL
CASE NUMBER: 2019064000096
HX ANION GAP: 8 — NL (ref 3.0–11.0)
HX BUN: 16 mg/dL — NL (ref 8.0–23.0)
HX CALCIUM LVL: 7.7 mg/dL — ABNORMAL LOW (ref 8.5–10.5)
HX CHLORIDE: 113 mmol/L — ABNORMAL HIGH (ref 98.0–110.0)
HX CO2: 23 mmol/L — NL (ref 21.0–32.0)
HX CREATININE: 0.997 mg/dL — NL (ref 0.55–1.3)
HX GLUCOSE LVL: 77 mg/dL — NL (ref 70.0–110.0)
HX POTASSIUM LVL: 3.4 mmol/L — ABNORMAL LOW (ref 3.6–5.2)
HX SODIUM LVL: 144 mmol/L — NL (ref 136.0–146.0)

## 2017-09-06 LAB — HX C-REACTIVE PROTEIN (CRP)
CASE NUMBER: 17634235
HX C-REACTIVE PROTEIN: 17.1 mg/dL — ABNORMAL HIGH (ref 0.0–0.8)

## 2017-09-06 LAB — HX CBC W/ INDICES
CASE NUMBER: 2019064000096
HX ABSOLUTE NRBC COUNT: 0 10*3/uL
HX HCT: 24.4 % — ABNORMAL LOW (ref 36.0–47.0)
HX HGB: 7.8 g/dL — ABNORMAL LOW (ref 11.8–16.0)
HX MCH: 34.5 pg — ABNORMAL HIGH (ref 26.0–34.0)
HX MCHC: 32 g/dL — NL (ref 31.0–37.0)
HX MCV: 108 fL — ABNORMAL HIGH (ref 80.0–100.0)
HX MPV: 10.1 fL — NL (ref 9.4–12.4)
HX NRBC PERCENT: 0 % — NL
HX PLATELET: 116 10*3/uL — ABNORMAL LOW (ref 150.0–400.0)
HX RBC: 2.26 10*6/uL — ABNORMAL LOW (ref 3.9–5.2)
HX RDW-CV: 19.9 % — ABNORMAL HIGH (ref 11.5–14.5)
HX RDW-SD: 78.3 fL — ABNORMAL HIGH (ref 35.0–51.0)
HX WBC: 3.9 10*3/uL — NL (ref 3.7–11.2)

## 2017-09-06 LAB — HX PHOSPHORUS LEVEL
CASE NUMBER: 2019064000096
HX PHOSPHORUS: 2.8 mg/dL — NL (ref 2.4–4.9)

## 2017-09-06 LAB — HX MAGNESIUM LEVEL
CASE NUMBER: 2019064000096
HX MAGNESIUM LVL: 2.4 mg/dL — NL (ref 1.7–2.5)

## 2017-09-07 LAB — HX CBC W/ INDICES
CASE NUMBER: 2019065000063
HX ABSOLUTE NRBC COUNT: 0 10*3/uL
HX HCT: 23.1 % — ABNORMAL LOW (ref 36.0–47.0)
HX HGB: 7.4 g/dL — ABNORMAL LOW (ref 11.8–16.0)
HX MCH: 34.6 pg — ABNORMAL HIGH (ref 26.0–34.0)
HX MCHC: 32 g/dL — NL (ref 31.0–37.0)
HX MCV: 107.9 fL — ABNORMAL HIGH (ref 80.0–100.0)
HX MPV: 10.1 fL — NL (ref 9.4–12.4)
HX NRBC PERCENT: 0 % — NL
HX PLATELET: 107 10*3/uL — ABNORMAL LOW (ref 150.0–400.0)
HX RBC: 2.14 10*6/uL — ABNORMAL LOW (ref 3.9–5.2)
HX RDW-CV: 19.8 % — ABNORMAL HIGH (ref 11.5–14.5)
HX RDW-SD: 78.3 fL — ABNORMAL HIGH (ref 35.0–51.0)
HX WBC: 4.6 10*3/uL — NL (ref 3.7–11.2)

## 2017-09-07 LAB — HX BASIC METABOLIC PANEL
CASE NUMBER: 2019065000063
HX ANION GAP: 8 — NL (ref 3.0–11.0)
HX BUN: 15 mg/dL — NL (ref 8.0–23.0)
HX CALCIUM LVL: 7.3 mg/dL — ABNORMAL LOW (ref 8.5–10.5)
HX CHLORIDE: 113 mmol/L — ABNORMAL HIGH (ref 98.0–110.0)
HX CO2: 26 mmol/L — NL (ref 21.0–32.0)
HX CREATININE: 1.05 mg/dL — NL (ref 0.55–1.3)
HX GLUCOSE LVL: 92 mg/dL — NL (ref 70.0–110.0)
HX POTASSIUM LVL: 2.9 mmol/L — ABNORMAL LOW (ref 3.6–5.2)
HX SODIUM LVL: 147 mmol/L — ABNORMAL HIGH (ref 136.0–146.0)

## 2017-09-07 LAB — HX GLOMERULAR FILTRATION RATE (ESTIMATED)
CASE NUMBER: 2019065000063
HX AFN AMER GLOMERULAR FILTRATION RATE: 64 mL/min/{1.73_m2}
HX NON-AFN AMER GLOMERULAR FILTRATION RATE: 56 mL/min/{1.73_m2}

## 2017-09-07 LAB — HX PHOSPHORUS LEVEL
CASE NUMBER: 2019065000063
HX PHOSPHORUS: 4.7 mg/dL — NL (ref 2.4–4.9)

## 2017-09-07 LAB — HX MAGNESIUM LEVEL
CASE NUMBER: 2019065000063
HX MAGNESIUM LVL: 2.1 mg/dL — NL (ref 1.7–2.5)

## 2017-09-08 LAB — HX CBC W/ INDICES
CASE NUMBER: 2019066000053
HX ABSOLUTE NRBC COUNT: 0 10*3/uL
HX HCT: 24 % — ABNORMAL LOW (ref 36.0–47.0)
HX HGB: 7.7 g/dL — ABNORMAL LOW (ref 11.8–16.0)
HX MCH: 34.7 pg — ABNORMAL HIGH (ref 26.0–34.0)
HX MCHC: 32.1 g/dL — NL (ref 31.0–37.0)
HX MCV: 108.1 fL — ABNORMAL HIGH (ref 80.0–100.0)
HX MPV: 10.1 fL — NL (ref 9.4–12.4)
HX NRBC PERCENT: 0 % — NL
HX PLATELET: 149 10*3/uL — ABNORMAL LOW (ref 150.0–400.0)
HX RBC: 2.22 10*6/uL — ABNORMAL LOW (ref 3.9–5.2)
HX RDW-CV: 19.9 % — ABNORMAL HIGH (ref 11.5–14.5)
HX RDW-SD: 78.3 fL — ABNORMAL HIGH (ref 35.0–51.0)
HX WBC: 6 10*3/uL — NL (ref 3.7–11.2)

## 2017-09-08 LAB — HX IRON PROFILE
CASE NUMBER: 17645389
HX % IRON BOUND: >100 — ABNORMAL HIGH (ref 10.0–50.0)
HX FERRITIN LVL: 1989 ng/mL — ABNORMAL HIGH (ref 11.0–307.0)
HX IRON: 43 ug/dL — NL (ref 30.0–160.0)
HX TIBC: 33 ug/dL — ABNORMAL LOW (ref 250.0–450.0)

## 2017-09-08 LAB — HX BASIC METABOLIC PANEL
CASE NUMBER: 2019066000053
HX ANION GAP: 11 — NL (ref 3.0–11.0)
HX BUN: 13 mg/dL — NL (ref 8.0–23.0)
HX CALCIUM LVL: 7.3 mg/dL — ABNORMAL LOW (ref 8.5–10.5)
HX CHLORIDE: 110 mmol/L — NL (ref 98.0–110.0)
HX CO2: 23 mmol/L — NL (ref 21.0–32.0)
HX CREATININE: 1.1 mg/dL — NL (ref 0.55–1.3)
HX GLUCOSE LVL: 81 mg/dL — NL (ref 70.0–110.0)
HX POTASSIUM LVL: 2.7 mmol/L — CR (ref 3.6–5.2)
HX SODIUM LVL: 144 mmol/L — NL (ref 136.0–146.0)

## 2017-09-08 LAB — HX VITAMIN B12 LEVEL
CASE NUMBER: 2019066000053
HX VITAMIN B12 LVL: >2000 — ABNORMAL HIGH (ref 193.0–986.0)

## 2017-09-08 LAB — HX PHOSPHORUS LEVEL
CASE NUMBER: 2019066000053
HX PHOSPHORUS: 3.9 mg/dL — NL (ref 2.4–4.9)

## 2017-09-08 LAB — HX POTASSIUM LEVEL
CASE NUMBER: 2019066001688
CASE NUMBER: 2019066003344
HX POTASSIUM LVL: 3.1 mmol/L — ABNORMAL LOW (ref 3.6–5.2)
HX POTASSIUM LVL: 3.3 mmol/L — ABNORMAL LOW (ref 3.6–5.2)

## 2017-09-08 LAB — HX GLOMERULAR FILTRATION RATE (ESTIMATED)
CASE NUMBER: 2019066000053
HX AFN AMER GLOMERULAR FILTRATION RATE: 61 mL/min/{1.73_m2}
HX NON-AFN AMER GLOMERULAR FILTRATION RATE: 53 mL/min/{1.73_m2}

## 2017-09-08 LAB — HX MAGNESIUM LEVEL
CASE NUMBER: 2019066000053
HX MAGNESIUM LVL: 2.1 mg/dL — NL (ref 1.7–2.5)

## 2017-09-09 LAB — HX BASIC METABOLIC PANEL
CASE NUMBER: 2019067000073
HX ANION GAP: 8 — NL (ref 3.0–11.0)
HX BUN: 9 mg/dL — NL (ref 8.0–23.0)
HX CALCIUM LVL: 7.5 mg/dL — ABNORMAL LOW (ref 8.5–10.5)
HX CHLORIDE: 112 mmol/L — ABNORMAL HIGH (ref 98.0–110.0)
HX CO2: 22 mmol/L — NL (ref 21.0–32.0)
HX CREATININE: 0.965 mg/dL — NL (ref 0.55–1.3)
HX GLUCOSE LVL: 90 mg/dL — NL (ref 70.0–110.0)
HX POTASSIUM LVL: 4.1 mmol/L — NL (ref 3.6–5.2)
HX SODIUM LVL: 142 mmol/L — NL (ref 136.0–146.0)

## 2017-09-09 LAB — HX HEPATIC FUNCTION PANEL
CASE NUMBER: 17652514
HX ALBUMIN LVL: 1.4 g/dL — ABNORMAL LOW (ref 3.2–5.0)
HX ALKALINE PHOSPHATASE: 191 U/L — ABNORMAL HIGH (ref 30.0–117.0)
HX ALT: 30 U/L — NL (ref 6.0–55.0)
HX AST: 40 U/L — NL (ref 6.0–40.0)
HX BILIRUBIN DIRECT: 1.1 mg/dL — ABNORMAL HIGH (ref 0.0–0.3)
HX BILIRUBIN TOTAL: 1.4 mg/dL — ABNORMAL HIGH (ref 0.2–1.2)
HX TOTAL PROTEIN: 5.5 g/dL — ABNORMAL LOW (ref 6.0–8.4)

## 2017-09-09 LAB — HX MAGNESIUM LEVEL
CASE NUMBER: 2019067000073
HX MAGNESIUM LVL: 2.2 mg/dL — NL (ref 1.7–2.5)

## 2017-09-09 LAB — HX GLOMERULAR FILTRATION RATE (ESTIMATED)
CASE NUMBER: 2019067000073
HX AFN AMER GLOMERULAR FILTRATION RATE: 71 mL/min/{1.73_m2}
HX NON-AFN AMER GLOMERULAR FILTRATION RATE: 62 mL/min/{1.73_m2}

## 2017-09-09 LAB — HX CBC W/ INDICES
CASE NUMBER: 2019067000073
HX ABSOLUTE NRBC COUNT: 0 10*3/uL
HX HCT: 24.5 % — ABNORMAL LOW (ref 36.0–47.0)
HX HGB: 7.8 g/dL — ABNORMAL LOW (ref 11.8–16.0)
HX MCH: 34.8 pg — ABNORMAL HIGH (ref 26.0–34.0)
HX MCHC: 31.8 g/dL — NL (ref 31.0–37.0)
HX MCV: 109.4 fL — ABNORMAL HIGH (ref 80.0–100.0)
HX MPV: 9.9 fL — NL (ref 9.4–12.4)
HX NRBC PERCENT: 0 % — NL
HX PLATELET: 217 10*3/uL — NL (ref 150.0–400.0)
HX RBC: 2.24 10*6/uL — ABNORMAL LOW (ref 3.9–5.2)
HX RDW-CV: 19.7 % — ABNORMAL HIGH (ref 11.5–14.5)
HX RDW-SD: 78.7 fL — ABNORMAL HIGH (ref 35.0–51.0)
HX WBC: 6.8 10*3/uL — NL (ref 3.7–11.2)

## 2017-09-09 LAB — HX PHOSPHORUS LEVEL
CASE NUMBER: 2019067000073
HX PHOSPHORUS: 3.6 mg/dL — NL (ref 2.4–4.9)

## 2017-09-10 LAB — HX POTASSIUM LEVEL
CASE NUMBER: 2019068001443
HX POTASSIUM LVL: 3.8 mmol/L — NL (ref 3.6–5.2)

## 2017-09-10 LAB — HX BASIC METABOLIC PANEL
CASE NUMBER: 2019068000067
HX ANION GAP: 11 — NL (ref 3.0–11.0)
HX BUN: 11 mg/dL — NL (ref 8.0–23.0)
HX CALCIUM LVL: 7.5 mg/dL — ABNORMAL LOW (ref 8.5–10.5)
HX CHLORIDE: 112 mmol/L — ABNORMAL HIGH (ref 98.0–110.0)
HX CO2: 22 mmol/L — NL (ref 21.0–32.0)
HX CREATININE: 1.1 mg/dL — NL (ref 0.55–1.3)
HX GLUCOSE LVL: 80 mg/dL — NL (ref 70.0–110.0)
HX POTASSIUM LVL: 3.9 mmol/L — NL (ref 3.6–5.2)
HX SODIUM LVL: 145 mmol/L — NL (ref 136.0–146.0)

## 2017-09-10 LAB — HX GLOMERULAR FILTRATION RATE (ESTIMATED)
CASE NUMBER: 2019068000067
HX AFN AMER GLOMERULAR FILTRATION RATE: 61 mL/min/{1.73_m2}
HX NON-AFN AMER GLOMERULAR FILTRATION RATE: 53 mL/min/{1.73_m2}

## 2017-09-10 LAB — HX CBC W/ INDICES
CASE NUMBER: 2019068000067
HX ABSOLUTE NRBC COUNT: 0 10*3/uL
HX HCT: 24.9 % — ABNORMAL LOW (ref 36.0–47.0)
HX HGB: 7.6 g/dL — ABNORMAL LOW (ref 11.8–16.0)
HX MCH: 34.4 pg — ABNORMAL HIGH (ref 26.0–34.0)
HX MCHC: 30.5 g/dL — ABNORMAL LOW (ref 31.0–37.0)
HX MCV: 112.7 fL — ABNORMAL HIGH (ref 80.0–100.0)
HX MPV: 9.8 fL — NL (ref 9.4–12.4)
HX NRBC PERCENT: 0 % — NL
HX PLATELET: 283 10*3/uL — NL (ref 150.0–400.0)
HX RBC: 2.21 10*6/uL — ABNORMAL LOW (ref 3.9–5.2)
HX RDW-CV: 20 % — ABNORMAL HIGH (ref 11.5–14.5)
HX RDW-SD: 82.2 fL — ABNORMAL HIGH (ref 35.0–51.0)
HX WBC: 7.3 10*3/uL — NL (ref 3.7–11.2)

## 2017-09-10 LAB — HX PHOSPHORUS LEVEL
CASE NUMBER: 2019068000067
HX PHOSPHORUS: 5.2 mg/dL — ABNORMAL HIGH (ref 2.4–4.9)

## 2017-09-10 LAB — HX FOLATE LEVEL
CASE NUMBER: 2019068000067
HX FOLATE LVL: >20 — NL

## 2017-09-10 LAB — HX MAGNESIUM LEVEL
CASE NUMBER: 2019068000067
HX MAGNESIUM LVL: 2.1 mg/dL — NL (ref 1.7–2.5)

## 2017-09-11 LAB — HX MAGNESIUM LEVEL
CASE NUMBER: 2019069000082
HX MAGNESIUM LVL: 2.2 mg/dL — NL (ref 1.7–2.5)

## 2017-09-11 LAB — HX BASIC METABOLIC PANEL
CASE NUMBER: 2019069000082
HX ANION GAP: 10 — NL (ref 3.0–11.0)
HX BUN: 13 mg/dL — NL (ref 8.0–23.0)
HX CALCIUM LVL: 7.2 mg/dL — ABNORMAL LOW (ref 8.5–10.5)
HX CHLORIDE: 111 mmol/L — ABNORMAL HIGH (ref 98.0–110.0)
HX CO2: 21 mmol/L — NL (ref 21.0–32.0)
HX CREATININE: 1.21 mg/dL — NL (ref 0.55–1.3)
HX GLUCOSE LVL: 81 mg/dL — NL (ref 70.0–110.0)
HX POTASSIUM LVL: 3.5 mmol/L — ABNORMAL LOW (ref 3.6–5.2)
HX SODIUM LVL: 142 mmol/L — NL (ref 136.0–146.0)

## 2017-09-11 LAB — HX GLOMERULAR FILTRATION RATE (ESTIMATED)
CASE NUMBER: 2019069000082
HX AFN AMER GLOMERULAR FILTRATION RATE: 54 mL/min/{1.73_m2}
HX NON-AFN AMER GLOMERULAR FILTRATION RATE: 47 mL/min/{1.73_m2}

## 2017-09-11 LAB — HX CBC W/ INDICES
CASE NUMBER: 2019069000082
HX ABSOLUTE NRBC COUNT: 0 10*3/uL
HX HCT: 24.3 % — ABNORMAL LOW (ref 36.0–47.0)
HX HGB: 7.6 g/dL — ABNORMAL LOW (ref 11.8–16.0)
HX MCH: 34.7 pg — ABNORMAL HIGH (ref 26.0–34.0)
HX MCHC: 31.3 g/dL — NL (ref 31.0–37.0)
HX MCV: 111 fL — ABNORMAL HIGH (ref 80.0–100.0)
HX MPV: 9.4 fL — NL (ref 9.4–12.4)
HX NRBC PERCENT: 0 % — NL
HX PLATELET: 327 10*3/uL — NL (ref 150.0–400.0)
HX RBC: 2.19 10*6/uL — ABNORMAL LOW (ref 3.9–5.2)
HX RDW-CV: 19.9 % — ABNORMAL HIGH (ref 11.5–14.5)
HX RDW-SD: 80.7 fL — ABNORMAL HIGH (ref 35.0–51.0)
HX WBC: 7.8 10*3/uL — NL (ref 3.7–11.2)

## 2017-09-11 LAB — HX PHOSPHORUS LEVEL
CASE NUMBER: 2019069000082
HX PHOSPHORUS: 5.1 mg/dL — ABNORMAL HIGH (ref 2.4–4.9)

## 2017-09-12 LAB — HX BASIC METABOLIC PANEL
CASE NUMBER: 2019070000067
HX ANION GAP: 9 — NL (ref 3.0–11.0)
HX BUN: 13 mg/dL — NL (ref 8.0–23.0)
HX CALCIUM LVL: 7.3 mg/dL — ABNORMAL LOW (ref 8.5–10.5)
HX CHLORIDE: 109 mmol/L — NL (ref 98.0–110.0)
HX CO2: 21 mmol/L — NL (ref 21.0–32.0)
HX CREATININE: 1.56 mg/dL — ABNORMAL HIGH (ref 0.55–1.3)
HX GLUCOSE LVL: 102 mg/dL — NL (ref 70.0–110.0)
HX POTASSIUM LVL: 3 mmol/L — ABNORMAL LOW (ref 3.6–5.2)
HX SODIUM LVL: 139 mmol/L — NL (ref 136.0–146.0)

## 2017-09-12 LAB — HX CBC W/ INDICES
CASE NUMBER: 2019070000067
HX ABSOLUTE NRBC COUNT: 0 10*3/uL
HX HCT: 24.9 % — ABNORMAL LOW (ref 36.0–47.0)
HX HGB: 7.8 g/dL — ABNORMAL LOW (ref 11.8–16.0)
HX MCH: 34.4 pg — ABNORMAL HIGH (ref 26.0–34.0)
HX MCHC: 31.3 g/dL — NL (ref 31.0–37.0)
HX MCV: 109.7 fL — ABNORMAL HIGH (ref 80.0–100.0)
HX MPV: 9 fL — ABNORMAL LOW (ref 9.4–12.4)
HX NRBC PERCENT: 0 % — NL
HX PLATELET: 361 10*3/uL — NL (ref 150.0–400.0)
HX RBC: 2.27 10*6/uL — ABNORMAL LOW (ref 3.9–5.2)
HX RDW-CV: 20 % — ABNORMAL HIGH (ref 11.5–14.5)
HX RDW-SD: 80.8 fL — ABNORMAL HIGH (ref 35.0–51.0)
HX WBC: 8.2 10*3/uL — NL (ref 3.7–11.2)

## 2017-09-12 LAB — HX BLOOD CULTURE
CASE NUMBER: 2019066001155
CASE NUMBER: 2019066001156
HX F: NO GROWTH
HX F: NO GROWTH

## 2017-09-12 LAB — HX GLOMERULAR FILTRATION RATE (ESTIMATED)
CASE NUMBER: 2019070000067
HX AFN AMER GLOMERULAR FILTRATION RATE: 40 mL/min/{1.73_m2}
HX NON-AFN AMER GLOMERULAR FILTRATION RATE: 35 mL/min/{1.73_m2}

## 2017-09-12 LAB — HX MAGNESIUM LEVEL
CASE NUMBER: 2019070000067
HX MAGNESIUM LVL: 2.2 mg/dL — NL (ref 1.7–2.5)

## 2017-09-12 LAB — HX PHOSPHORUS LEVEL
CASE NUMBER: 2019070000067
HX PHOSPHORUS: 6.2 mg/dL — ABNORMAL HIGH (ref 2.4–4.9)

## 2017-09-13 LAB — HX CBC W/ INDICES
CASE NUMBER: 2019071000535
HX ABSOLUTE NRBC COUNT: 0 10*3/uL
HX HCT: 21.9 % — ABNORMAL LOW (ref 36.0–47.0)
HX HGB: 7 g/dL — ABNORMAL LOW (ref 11.8–16.0)
HX MCH: 34.8 pg — ABNORMAL HIGH (ref 26.0–34.0)
HX MCHC: 32 g/dL — NL (ref 31.0–37.0)
HX MCV: 109 fL — ABNORMAL HIGH (ref 80.0–100.0)
HX MPV: 9 fL — ABNORMAL LOW (ref 9.4–12.4)
HX NRBC PERCENT: 0 % — NL
HX PLATELET: 369 10*3/uL — NL (ref 150.0–400.0)
HX RBC: 2.01 10*6/uL — ABNORMAL LOW (ref 3.9–5.2)
HX RDW-CV: 19.8 % — ABNORMAL HIGH (ref 11.5–14.5)
HX RDW-SD: 78.2 fL — ABNORMAL HIGH (ref 35.0–51.0)
HX WBC: 9 10*3/uL — NL (ref 3.7–11.2)

## 2017-09-13 LAB — HX OCCULT BLOOD STOOL
CASE NUMBER: 2019071001889
HX COLLECT DATE I: 1:2019031200000000 {titer}
HX OCCULT BLOOD STOOL: NEGATIVE — NL

## 2017-09-13 LAB — HX BASIC METABOLIC PANEL
CASE NUMBER: 2019071000084
HX ANION GAP: 12 — ABNORMAL HIGH (ref 3.0–11.0)
HX BUN: 14 mg/dL — NL (ref 8.0–23.0)
HX CALCIUM LVL: 6.9 mg/dL — CR (ref 8.5–10.5)
HX CHLORIDE: 110 mmol/L — NL (ref 98.0–110.0)
HX CO2: 19 mmol/L — ABNORMAL LOW (ref 21.0–32.0)
HX CREATININE: 1.6 mg/dL — ABNORMAL HIGH (ref 0.55–1.3)
HX GLUCOSE LVL: 79 mg/dL — NL (ref 70.0–110.0)
HX POTASSIUM LVL: 3.1 mmol/L — ABNORMAL LOW (ref 3.6–5.2)
HX SODIUM LVL: 141 mmol/L — NL (ref 136.0–146.0)

## 2017-09-13 LAB — HX C DIFFICILE TOXIN B BY PCR
CASE NUMBER: 2019071001894
HX C DIFF 027 BY PCR: NEGATIVE
HX C DIFFICILE TOXIN B BY PCR: NOT DETECTED

## 2017-09-13 LAB — HX ABO/RH TYPE
CASE NUMBER: 2019071001564
HX ABO/RH TYPE: A POS — NL

## 2017-09-13 LAB — HX MAGNESIUM LEVEL
CASE NUMBER: 2019071000084
HX MAGNESIUM LVL: 2.1 mg/dL — NL (ref 1.7–2.5)

## 2017-09-13 LAB — HX CROSSMATCH: CASE NUMBER: 2019071001564

## 2017-09-13 LAB — HX ANTIBODY SCREEN
CASE NUMBER: 2019071001564
HX ANTIBODY SCREEN AUTOMATED: NEGATIVE — NL

## 2017-09-13 LAB — HX GLOMERULAR FILTRATION RATE (ESTIMATED)
CASE NUMBER: 2019071000084
HX AFN AMER GLOMERULAR FILTRATION RATE: 39 mL/min/{1.73_m2}
HX NON-AFN AMER GLOMERULAR FILTRATION RATE: 34 mL/min/{1.73_m2}

## 2017-09-13 LAB — HX PHOSPHORUS LEVEL
CASE NUMBER: 2019071000084
HX PHOSPHORUS: 6.3 mg/dL — ABNORMAL HIGH (ref 2.4–4.9)

## 2017-09-13 LAB — HX .REDCELL
CASE NUMBER: 2019071001564
HX ADDITIONAL UNITS TO CROSSMATCH: 0 — NL
HX NUMBER OF UNITS TO TRANSFUSE: 2

## 2017-09-14 LAB — HX CBC W/ DIFF
CASE NUMBER: 2019072000389
HX ABSOLUTE NRBC COUNT: 0 10*3/uL
HX HCT: 28.1 % — ABNORMAL LOW (ref 36.0–47.0)
HX HGB: 9.3 g/dL — ABNORMAL LOW (ref 11.8–16.0)
HX MCH: 32.5 pg — NL (ref 26.0–34.0)
HX MCHC: 33.1 g/dL — NL (ref 31.0–37.0)
HX MCV: 98.3 fL — NL (ref 80.0–100.0)
HX MPV: 9.1 fL — ABNORMAL LOW (ref 9.4–12.4)
HX NRBC PERCENT: 0 % — NL
HX PLATELET: 382 10*3/uL — NL (ref 150.0–400.0)
HX RBC: 2.86 10*6/uL — ABNORMAL LOW (ref 3.9–5.2)
HX RDW-CV: 24.6 % — ABNORMAL HIGH (ref 11.5–14.5)
HX RDW-SD: 84.4 fL — ABNORMAL HIGH (ref 35.0–51.0)
HX WBC: 9.2 10*3/uL — NL (ref 3.7–11.2)

## 2017-09-14 LAB — HX .AUTOMATED DIFF
CASE NUMBER: 2019072000389
HX ABSOLUTE BASO COUNT: 0.03 10*3/uL — NL (ref 0.0–0.22)
HX ABSOLUTE EOS COUNT: 0.04 10*3/uL — NL (ref 0.0–0.45)
HX ABSOLUTE LYMPHS COUNT: 1.13 10*3/uL — NL (ref 0.74–5.04)
HX ABSOLUTE MONO COUNT: 0.7 10*3/uL — NL (ref 0.0–1.34)
HX ABSOLUTE NEUTRO COUNT: 7.15 10*3/uL — NL (ref 1.48–7.95)
HX BASOPHILS: 0.3 %
HX EOSINOPHILS: 0.4 %
HX IMMATURE GRANULOCYTES: 1.2 % — NL (ref 0.0–2.0)
HX LYMPHOCYTES: 12.3 %
HX MONOCYTES: 7.6 %
HX NEUTROPHILS: 78.2 %

## 2017-09-14 LAB — HX BASIC METABOLIC PANEL
CASE NUMBER: 2019072000068
HX ANION GAP: 12 — ABNORMAL HIGH (ref 3.0–11.0)
HX BUN: 17 mg/dL — NL (ref 8.0–23.0)
HX CALCIUM LVL: 7.2 mg/dL — ABNORMAL LOW (ref 8.5–10.5)
HX CHLORIDE: 108 mmol/L — NL (ref 98.0–110.0)
HX CO2: 21 mmol/L — NL (ref 21.0–32.0)
HX CREATININE: 1.84 mg/dL — ABNORMAL HIGH (ref 0.55–1.3)
HX GLUCOSE LVL: 85 mg/dL — NL (ref 70.0–110.0)
HX POTASSIUM LVL: 3 mmol/L — ABNORMAL LOW (ref 3.6–5.2)
HX SODIUM LVL: 141 mmol/L — NL (ref 136.0–146.0)

## 2017-09-14 LAB — HX .RBC MORPHOLOGY: CASE NUMBER: 2019072000389

## 2017-09-14 LAB — HX MAGNESIUM LEVEL
CASE NUMBER: 2019072000068
HX MAGNESIUM LVL: 2.2 mg/dL — NL (ref 1.7–2.5)

## 2017-09-14 LAB — HX GLOMERULAR FILTRATION RATE (ESTIMATED)
CASE NUMBER: 2019072000068
HX AFN AMER GLOMERULAR FILTRATION RATE: 33 mL/min/{1.73_m2}
HX NON-AFN AMER GLOMERULAR FILTRATION RATE: 28 mL/min/{1.73_m2}

## 2017-09-14 LAB — HX PHOSPHORUS LEVEL
CASE NUMBER: 2019072000023
HX PHOSPHORUS: 6.1 mg/dL — ABNORMAL HIGH (ref 2.4–4.9)

## 2017-09-15 LAB — HX CBC W/ INDICES
CASE NUMBER: 2019073000067
HX ABSOLUTE NRBC COUNT: 0 10*3/uL
HX HCT: 29.6 % — ABNORMAL LOW (ref 36.0–47.0)
HX HGB: 9.9 g/dL — ABNORMAL LOW (ref 11.8–16.0)
HX MCH: 33.2 pg — NL (ref 26.0–34.0)
HX MCHC: 33.4 g/dL — NL (ref 31.0–37.0)
HX MCV: 99.3 fL — NL (ref 80.0–100.0)
HX MPV: 8.9 fL — ABNORMAL LOW (ref 9.4–12.4)
HX NRBC PERCENT: 0 % — NL
HX PLATELET: 410 10*3/uL — ABNORMAL HIGH (ref 150.0–400.0)
HX RBC: 2.98 10*6/uL — ABNORMAL LOW (ref 3.9–5.2)
HX RDW-CV: 25.1 % — ABNORMAL HIGH (ref 11.5–14.5)
HX RDW-SD: 86.7 fL — ABNORMAL HIGH (ref 35.0–51.0)
HX WBC: 11.9 10*3/uL — ABNORMAL HIGH (ref 3.7–11.2)

## 2017-09-15 LAB — HX BASIC METABOLIC PANEL
CASE NUMBER: 2019073000067
HX ANION GAP: 12 — ABNORMAL HIGH (ref 3.0–11.0)
HX BUN: 17 mg/dL — NL (ref 8.0–23.0)
HX CALCIUM LVL: 7.1 mg/dL — ABNORMAL LOW (ref 8.5–10.5)
HX CHLORIDE: 108 mmol/L — NL (ref 98.0–110.0)
HX CO2: 19 mmol/L — ABNORMAL LOW (ref 21.0–32.0)
HX CREATININE: 1.57 mg/dL — ABNORMAL HIGH (ref 0.55–1.3)
HX GLUCOSE LVL: 93 mg/dL — NL (ref 70.0–110.0)
HX POTASSIUM LVL: 3.2 mmol/L — ABNORMAL LOW (ref 3.6–5.2)
HX SODIUM LVL: 139 mmol/L — NL (ref 136.0–146.0)

## 2017-09-15 LAB — HX MAGNESIUM LEVEL
CASE NUMBER: 2019073000067
HX MAGNESIUM LVL: 2.1 mg/dL — NL (ref 1.7–2.5)

## 2017-09-15 LAB — HX PHOSPHORUS LEVEL
CASE NUMBER: 2019073000067
HX PHOSPHORUS: 4.9 mg/dL — NL (ref 2.4–4.9)

## 2017-09-15 LAB — HX GLOMERULAR FILTRATION RATE (ESTIMATED)
CASE NUMBER: 2019073000067
HX AFN AMER GLOMERULAR FILTRATION RATE: 40 mL/min/{1.73_m2}
HX NON-AFN AMER GLOMERULAR FILTRATION RATE: 34 mL/min/{1.73_m2}

## 2017-09-29 ENCOUNTER — Ambulatory Visit: Admitting: Family Medicine

## 2017-09-29 LAB — HX LIPASE LEVEL
CASE NUMBER: 2019087003935
HX LIPASE LVL: 31 U/L — ABNORMAL LOW (ref 73.0–393.0)

## 2017-09-29 LAB — HX COMPREHENSIVE METABOLIC PANEL
CASE NUMBER: 2019087003935
HX ALBUMIN LVL: 1.4 g/dL — ABNORMAL LOW (ref 3.2–5.0)
HX ALKALINE PHOSPHATASE: 197 U/L — ABNORMAL HIGH (ref 30.0–117.0)
HX ALT: 13 U/L — NL (ref 6.0–55.0)
HX ANION GAP: 12 — ABNORMAL HIGH (ref 3.0–11.0)
HX AST: 22 U/L — NL (ref 6.0–40.0)
HX BILIRUBIN TOTAL: 0.8 mg/dL — NL (ref 0.2–1.2)
HX BUN: 35 mg/dL — ABNORMAL HIGH (ref 8.0–23.0)
HX CALCIUM LVL: 7.4 mg/dL — ABNORMAL LOW (ref 8.5–10.5)
HX CHLORIDE: 107 mmol/L — NL (ref 98.0–110.0)
HX CO2: 23 mmol/L — NL (ref 21.0–32.0)
HX CREATININE: 1.76 mg/dL — ABNORMAL HIGH (ref 0.55–1.3)
HX GLUCOSE LVL: 89 mg/dL — NL (ref 70.0–110.0)
HX POTASSIUM LVL: 3.7 mmol/L — NL (ref 3.6–5.2)
HX SODIUM LVL: 142 mmol/L — NL (ref 136.0–146.0)
HX TOTAL PROTEIN: 6.4 g/dL — NL (ref 6.0–8.4)

## 2017-09-29 LAB — HX TROPONIN I
CASE NUMBER: 2019087003935
HX TROPONIN I: <0.015 — NL (ref 0.015–0.045)

## 2017-09-29 LAB — HX CREATINE KINASE
CASE NUMBER: 2019087003935
HX TOTAL CK: 36 U/L — NL (ref 29.0–143.0)

## 2017-09-29 LAB — HX MAGNESIUM LEVEL
CASE NUMBER: 2019087003935
HX MAGNESIUM LVL: 1.8 mg/dL — NL (ref 1.7–2.5)

## 2017-09-29 LAB — HX NT-PROBNP
CASE NUMBER: 2019087003935
HX NT-PROBNP: 4794 pg/mL — ABNORMAL HIGH (ref 0.0–900.0)

## 2017-09-29 LAB — HX GLOMERULAR FILTRATION RATE (ESTIMATED)
CASE NUMBER: 2019087003935
HX AFN AMER GLOMERULAR FILTRATION RATE: 34 mL/min/{1.73_m2}
HX NON-AFN AMER GLOMERULAR FILTRATION RATE: 30 mL/min/{1.73_m2}

## 2017-09-29 NOTE — Progress Notes (Signed)
Subjective    no overnight events, she wants to go today to appointment at 10am, for PICC  line, she was adamant to go asking for wheel chair, explained to the pt that  she is in hospital and nursing staff will take care of the picc line and abx  but she was refusing trying to get out of bed, she walked with walker and sat  on wheel chair she was not able to wheel herself, given the situation and risk  redirected her back and given zyprex 5mg  _IM once._    Review of Systems    Objective    Vitals & Measurements    **T:** 98.9 F (Oral) **TMIN:** 98.7 F (Oral) **TMAX:** 99.1 F (Oral)  **HR:** 101 (Peripheral Pulse) **RR:** 18 **BP:** 122/92 **SpO2:** 98% **O2  Method (L/min):** Room air    Physical Exam    alert and oriented    heent atraumatic, normocephalic    heart s1s2    lungs clear    abd soft and non tender    back tender on right side    neuro no gross motor defecit  [1]    Medications    _Inpatient_    acetaminophen 325 mg oral tablet, 650 mg= 2 tab(s), PO, q4hr, PRN    albuterol 2.5 mg/3 mL (0.083%) inhalation solution, 2.5 mg= 3 mL, NEB, q4hr,  PRN    amitriptyline, 100 mg= 4 tab(s), PO, HS    bacitracin 500 units/g topical ointment, 1 app, TOP, ud, PRN    budesonide 0.5 mg/2 mL inhalation suspension, 0.5 mg= 2 mL, NEB, BID    cefTRIAXone    Cymbalta, 60 mg= 2 cap(s), PO, Daily    folic acid, 1 mg= 1 tab(s), PO, Daily    formoterol 20 mcg/2 mL inhalation solution, 20 mcg= 2 mL, NEB, BID    heparin, 5000 Unit(s)= 1 mL, sc, q12hr    labetalol, 50 mg= 0.5 tab(s), PO, BID    Lasix, 40 mg= 1 tab(s), PO, Daily    Latuda, 120 mg= 6 tab(s), PO, HS    LORazepam, 0.5 mg= 1 tab(s), PO, q6hr, PRN    OLANZapine odt, 5 mg= 1 tab(s), PO, Daily    oxacillin    Protonix, 40 mg= 1 tab(s), PO, BID    senna, 8.6 mg= 1 tab(s), PO, BID    Sodium Chloride 0.9% Flush, 10 mL, Flush, ud, PRN    Sodium Chloride 0.9% Flush, 10 mL, Flush, q12hr    Spiriva, 18 mcg= 1 cap(s), INHAL, Daily    traMADol, 50 mg= 1 tab(s), PO, q4hr,  PRN    Vitamin B12, 500 mcg= 0.5 tab(s), PO, Daily    Vitamin D3, 1000 Unit(s)= 1 tab(s), PO, Daily    Lab Results    No labs resulted in the past 24 hours.    Diagnostic Results    Impression and Plan    # Unwitnessed fall at rehab.    # Chronic LE edema    # UTI    # Mild hypokalemia    # recent MSSA osteomyelitis with chronic back pain.    # CKD III    # Multiple skin excoriations throughout mostly in legs and upper chest. [1]        Fall likely mechanical,    PT eval    UTI curine culture shows gram neg rods started on iv ceftriaxone    Recent MSSA osteomyelitis, epidural abscess, psoas abscess, cont with abx  oxacillin as per original plan  6 weeks from 09/08/2017    CKD stage III creatinine stable cont    sc heparin for DVT prophylaxis [2]    agitation zyprexa PRN and soma bed.    [1] Progress/SOAP Note; Brayln Duque MD, Daleen Bo 10/01/2017 11:39 EDT    [2] Progress/SOAP Note; Rehanna Oloughlin MD, Daleen Bo 10/01/2017 11:39 EDT    SIGNATURE LINE Electronically signed by Einar Gip MD, Keyontae Huckeby on 10/02/2017 at  11:59:53 EST

## 2017-09-29 NOTE — H&P (Signed)
Chief Complaint    Fall at [facility]    History of Present Illness    67 year old female with a history of heart failure, COPD, hypertension,  chronic kidney disease who was admitted earlier in March for altered mental  status. She ended up developing sepsis with gram-positive bacteremia. Source  of the sepsis was unclear and ID was consulted. She had multiple wounds on her  lower extremities and also had some low back pain. 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 PICC line was placed she was discharge in  stable condition with plans to continue oxacillin. No clear endocarditis on  the transthoracic echocardiogram.        Last evening, she was found on the floor. Staff thinks she may of been on the  floor for about 4 hours or so. Patient herself denies any complaints. Denies  any headache, neck pain. Denies any new weakness or paresthesias. No chest  pain or shortness of breath. No abdominal pain. She complaints of back pain to  me, she feels hungry. Denies any lower extremity swelling. She is now being  admitted under obs.      Review of Systems    pt appears to be a poor historian but she was able to answer the above-  mentioned questions.    Code Status    Code Status - Ordered    -- 09/30/17 8:13:00 EDT, Full Resuscitation, Constant Order    Physical Exam    Vitals & Measurements    **T:** 98.0 F (Oral) **TMIN:** 98.0 F (Oral) **TMAX:** 98.4 F (Oral)  **HR:** 79 (Peripheral Pulse) **RR:** 18 **BP:** 133/71 **SpO2:** 100% **O2  Method (L/min):** Room air **WT:** 78.9 Kg    General: Alert, frail, elderly, quite deconditioned.    Skin: Warm, dry, pink, multiple areas of excoriations bilateral LE without  warmth, drainage, crepitus or fluctuance.    Head: Normocephalic, atraumatic.    Neck: Supple, trachea midline, no tenderness, no step offs, full range of  motion, no stiffness, no painful range of motion, no  crepitus.    Eye: Pupils are equal, round and reactive to light, extraocular movements are  intact, normal conjunctiva.    Ears, nose, mouth and throat: Oral mucosa moist, no pharyngeal erythema or  exudate.    Cardiovascular: Regular rate and rhythm, No murmur, Normal peripheral  perfusion, moderate BLE edema.    Respiratory: Lungs are clear to auscultation, respirations are non-labored,  breath sounds are equal, Symmetrical chest wall expansion.    Back: No step-offs, No crepitus/deformity. Minimal vague tenderness lower  lumbar area. No step-offs.    Musculoskeletal: No tenderness, no deformity.    Gastrointestinal: Soft, Nontender, Non distended, Normal bowel sounds.    Neurological: Awake, alert, slightly confused, overall quite deconditioned,  4/5 strength BUE/BLE.    Psychiatric: Mood and affect: Flat.      Impression and Plan    # Unwitnessed fall at rehab.    # Chronic LE edema    # Pyuria, ? UTI    # Mild hypokalemia    # recent MSSA osteomyelitis with chronic back pain.    # CKD III    # Multiple skin excoriations throughout mostly in legs and upper chest.        Plan:    Unclear how the fall happened, but sounds like mechanical fall. CK is flat.  She was on the floor about 4hr  per ER documentation.    Renal function stable, even slightly improved this am, she does have LE  swelling, ? if this contributing to her falling.    I will give her small dose lasix IV x1 and re-assess, her O2 level is good on  RA. She does not appear in overt HF at this time.    EKG personally reviewed shows NSR, poor voltages in precordial leads.    Replete K and monitor electrolytes.    Consul to COC placed.    Unclear whether she has true UTI, she is vague in terms of symptoms, I will  hold off on antibiotics at the time, she is non-toxic appearing. CXR clear for  acute infection.        I will try to call the HCP, sister, who lives in [place] for update.        This is likely an obs admission, <2MN stay pending clinical  improvement.        Problem List/Past Medical History    Ongoing    Anemia    Anxiety    Asthma    Bipolar 1 disorder    CHF - Congestive heart failure    CKD - chronic kidney disease    COPD - Chronic obstructive pulmonary disease    Depression    GERD    Hypertension    Neuropathy    Historical    Tobacco use    Procedure/Surgical History    Transfusion of Nonautologous Red Blood Cells into Peripheral Vein,  Percutaneous Approach (09/13/2017), Insertion of Endotracheal Airway into  Trachea, Via Natural or Artificial Opening (09/04/2017), Respiratory  Ventilation, 24-96 Consecutive Hours (09/04/2017), Inspection of Spinal Cord,  Percutaneous Approach (09/03/2017), Transfusion of Nonautologous Red Blood  Cells into Peripheral Vein, Percutaneous Approach (08/20/2017), EGD with  Anesthesia (N/A) (03/19/2017), Inspection of Upper Intestinal Tract, Via  Natural or Artificial Opening Endoscopic (03/19/2017), Transfusion of  Nonautologous Red Blood Cells into Peripheral Vein, Percutaneous Approach  (03/16/2017), EGD with Anesthesia (N/A) (12/23/2016), Inspection of Upper  Intestinal Tract, Via Natural or Artificial Opening Endoscopic (12/23/2016),  Transfusion of Nonautologous Red Blood Cells into Peripheral Vein,  Percutaneous Approach (12/20/2016), Drainage of Common Bile Duct with Drainage  Device, Percutaneous Approach (08/06/2016), Removal of Drainage Device from  Hepatobiliary Duct, External Approach (08/06/2016), Excision of Ampulla of  Vater, Percutaneous Approach, Diagnostic (08/05/2016), Drainage of Common Bile  Duct with Drainage Device, Percutaneous Approach (07/29/2016), Fluoroscopy of  Biliary and Pancreatic Ducts using Other Contrast (07/29/2016), TRAN NAUTO RED  BLD CLL PERIPH VN PC (10/04/2014), Gastric bypass surgery (2012).    Social History    _Alcohol_    Current, 1-2 times per week    Past    _Substance Abuse_    Never    _Tobacco_    Former smoker, Cigarettes    Family History    High blood  pressure: Father, Sister and Brother.    Allergies    NKA    Medications    _Inpatient_    acetaminophen 325 mg oral tablet, 650 mg= 2 tab(s), PO, q4hr, PRN    albuterol 2.5 mg/3 mL (0.083%) inhalation solution, 2.5 mg= 3 mL, NEB, q4hr,  PRN    amitriptyline, 100 mg= 4 tab(s), PO, HS    Atrovent neb, 500 mcg= 2.5 mL, NEB, QID    budesonide 0.5 mg/2 mL inhalation suspension, 0.5 mg= 2 mL, NEB, BID    Cymbalta, 60 mg= 2 cap(s), PO, Daily    folic acid, 1  mg= 1 tab(s), PO, Daily    formoterol 20 mcg/2 mL inhalation solution, 20 mcg= 2 mL, NEB, BID    labetalol, 50 mg= 0.5 tab(s), PO, BID    Lasix, 40 mg= 1 tab(s), PO, Daily    Latuda, 120 mg= 6 tab(s), PO, HS    LORazepam, 0.5 mg= 1 tab(s), PO, q6hr, PRN    OLANZapine odt, 5 mg= 1 tab(s), PO, Daily    oxacillin    Potassium Chloride Oral, 40 mEq= 30 mL, PO, q4hr    Protonix, 40 mg= 1 tab(s), PO, BID    senna, 8.6 mg= 1 tab(s), PO, BID    traMADol, 50 mg= 1 tab(s), PO, q4hr, PRN    Vitamin B12, 500 mcg= 0.5 tab(s), PO, Daily    Vitamin D3, 1000 Unit(s)= 1 tab(s), PO, Daily    _Home_    acetaminophen 325 mg oral tablet, 650 mg, PO, q4hr, PRN    amitriptyline, 100 mg, PO, HS    Breo Ellipta 100 mcg-25 mcg/inh inhalation powder, 1 puff(s), INHAL, Daily    Culturelle Digestive Health oral capsule, 1 cap(s), PO, BID    Cymbalta 60 mg oral delayed release capsule, 60 mg= 1 cap(s), PO, Daily    docusate sodium 100 mg oral capsule, 100 mg= 1 cap(s), PO, BID, PRN, 2 refills    ferrous gluconate 240 mg (27 mg elemental iron) oral tablet, 240 mg, PO, BID    folic acid 1 mg oral tablet, 1 mg= 1 tab(s), PO, Daily    labetalol 100 mg oral tablet, 50 mg= 0.5 tab(s), PO, BID    Lasix 40 mg oral tablet, 40 mg, PO, Daily, 2 refills    Latuda, 120 mg, PO, HS, evening    LORazepam, 0.5 mg, PO, q6hr, PRN    OLANZapine 5 mg oral tablet, disintegrating, 5 mg= 1 tab(s), PO, Daily    oxacillin 2 g injection, 2000 mg, IV, q4hr    Potassium Chloride Oral, 20 mEq, PO, BID    Protonix, 40 mg, PO,  BID    senna 8.6 mg oral tablet, 8.6 mg= 1 tab(s), PO, BID    Spiriva 18 mcg inhalation capsule, 18 mcg= 1 cap(s), INHAL, Daily    traMADol, 50 mg, PO, q4hr, PRN    Vitamin B12 250 mcg oral tablet, 500 mcg, PO, Daily    Vitamin D3, 1000 Unit(s), PO, Daily    Diet    Cardiac Solid Diet - Ordered    -- 09/30/17 8:28:00 EDT, Room Service, Scheduled / PRN    Lab Results    Glucose Lvl: 80 mg/dL (16/10/96 04:54:09)    BUN: 34 mg/dL High (81/19/14 78:29:56)    Creatinine: 1.66 mg/dL High (21/30/86 57:84:69)    Afn Amer Glomerular Filtration Rate: 37 ml/min/1.33m2 (09/30/17 08:42:00)    Non-Afn Amer Glomerular Filtration Rate: 32 ml/min/1.12m2 (09/30/17 08:42:00)    Sodium Lvl: 143 mmol/L (09/30/17 08:42:00)    Potassium Lvl: 3 mmol/L Low (09/30/17 08:42:00)    Chloride: 103 mmol/L (09/30/17 08:42:00)    CO2: 28 mmol/L (09/30/17 08:42:00)    Anion Gap: 12 High (09/30/17 08:42:00)    Total Protein: 6.4 Gm/dL (62/95/28 41:32:44)    Albumin Lvl: 1.4 Gm/dL Low (07/06/70 53:66:44)    Calcium Lvl: 7.7 mg/dL Low (03/47/42 59:56:38)    Magnesium Lvl: 1.8 mg/dL (75/64/33 29:51:88)    Bilirubin Total: 0.8 mg/dL (41/66/06 30:16:01)    Alkaline Phosphatase: 197 Units/L High (09/29/17 23:13:00)    AST: 22 Units/L (09/29/17 23:13:00)  ALT: 13 Units/L (09/29/17 23:13:00)    Lipase Lvl: 31 Units/L Low (09/29/17 23:13:00)    Total CK: 36 Units/L (09/29/17 23:13:00)    Troponin I: <0.015 (09/29/17 23:13:00)    NT-ProBNP: 4794 pGm/ml High (09/29/17 23:13:00)    WBC: 8.2 thous/mm3 (09/30/17 00:19:00)    RBC: 2.44 Mil/mm3 Low (09/30/17 00:19:00)    Hgb: 8.1 Gm/dL Low (16/10/96 04:54:09)    Hct: 25.6 % Low (09/30/17 00:19:00)    Platelet: 318 thous/mm3 (09/30/17 00:19:00)    MCV: 104.9 fL High (09/30/17 00:19:00)    MCH: 33.2 pGm (09/30/17 00:19:00)    MCHC: 31.6 Gm/dL (81/19/14 78:29:56)    RDW-SD: 94.4 fL High (09/30/17 00:19:00)    MPV: 9.2 fL Low (09/30/17 00:19:00)    Absolute Neutro Count: 5.77 thous/mm3 (09/30/17 00:19:00)    Absolute  Lymphs Count: 1.74 thous/mm3 (09/30/17 00:19:00)    Absolute Mono Count: 0.6 thous/mm3 (09/30/17 00:19:00)    Absolute Eos Count: 0.09 thous/mm3 (09/30/17 00:19:00)    Absolute Baso Count: 0.02 thous/mm3 (09/30/17 00:19:00)    Neutrophils: 69.9 % (09/30/17 00:19:00)    Lymphocytes: 21.1 % (09/30/17 00:19:00)    Monocytes: 7.3 % (09/30/17 00:19:00)    Eosinophils: 1.1 % (09/30/17 00:19:00)    Basophils: 0.2 % (09/30/17 00:19:00)    Immature Granulocytes: 0.4 % (09/30/17 00:19:00)    NRBC Percent: 0 % (09/30/17 00:19:00)    Absolute NRBC Count: 0 thous/mm3 (09/30/17 00:19:00)    Hypochromia: 1+ Abnormal (09/30/17 00:19:00)    Anisocytosis: 1+ Abnormal (09/30/17 00:19:00)    Platelet Morph: Enlarged Abnormal (09/30/17 00:19:00)    UA Color: Yellow1 (09/30/17 02:24:00)    UA Clarity: Hazy Abnormal (09/30/17 02:24:00)    UA Specific Gravity: 1.01 (09/30/17 02:24:00)    UA pH: 5 (09/30/17 02:24:00)    UA Protein: Negative1 (09/30/17 02:24:00)    UA Glucose: Negative1 (09/30/17 02:24:00)    UA Ketones: Negative1 (09/30/17 02:24:00)    UA Bilirubin: Negative1 (09/30/17 02:24:00)    UA Blood: Small Abnormal (09/30/17 02:24:00)    UA Urobilinogen: Negative1 (09/30/17 02:24:00)    UA Nitrite: Negative1 (09/30/17 02:24:00)    UA Leukocyte Esterase: 250 Abnormal (09/30/17 02:24:00)    UA RBC: <1 (09/30/17 02:24:00)    UA WBC: 36 /HPF High (09/30/17 02:24:00)    UA Squamous Epithelial: <1 (09/30/17 02:24:00)    UA Bacteria: 4+ Abnormal (09/30/17 02:24:00)    UA Mucous: Few (09/30/17 02:24:00)    UA Granular Cast: 3 /LPF High (09/30/17 02:24:00)    Diagnostic Results        ------        SIGNATURE LINE Electronically signed by Felipa Furnace MD (HOSP), Talan Gildner on  10/01/2017 at 14:35:59 EST

## 2017-09-29 NOTE — H&P (Signed)
I placed a call out to HCP sister in [place], but was ringing the entire day, so  asked COC to try as well, and update sister of current admission.    SIGNATURE LINE Electronically signed by Keoki Mchargue MD (HOSP), Firas Guardado on  10/01/2017 at 14:35:59 EST

## 2017-09-29 NOTE — Progress Notes (Signed)
Subjective    sitting, has chronic pain in the back and on abx, no urinary symptoms    Review of Systems    Objective    Vitals & Measurements    **T:** 98.1 F (Oral) **TMIN:** 98.1 F (Oral) **TMAX:** 98.6 F (Oral)  **HR:** 65 (Peripheral Pulse) **RR:** 18 **BP:** 93/67 **SpO2:** 99% **O2  Method (L/min):** Room air    Physical Exam    alert and oriented    heent atraumatic, normocephalic    heart s1s2    lungs clear    abd soft and non tender    back tender on right side    neuro at baseline    Medications    _Inpatient_    acetaminophen 325 mg oral tablet, 650 mg= 2 tab(s), PO, q4hr, PRN    albuterol 2.5 mg/3 mL (0.083%) inhalation solution, 2.5 mg= 3 mL, NEB, q4hr,  PRN    amitriptyline, 100 mg= 4 tab(s), PO, HS    budesonide 0.5 mg/2 mL inhalation suspension, 0.5 mg= 2 mL, NEB, BID    cefTRIAXone    Cymbalta, 60 mg= 2 cap(s), PO, Daily    folic acid, 1 mg= 1 tab(s), PO, Daily    formoterol 20 mcg/2 mL inhalation solution, 20 mcg= 2 mL, NEB, BID    heparin, 5000 Unit(s)= 1 mL, sc, q12hr    labetalol, 50 mg= 0.5 tab(s), PO, BID    Lasix, 40 mg= 1 tab(s), PO, Daily    Latuda, 120 mg= 6 tab(s), PO, HS    LORazepam, 0.5 mg= 1 tab(s), PO, q6hr, PRN    OLANZapine odt, 5 mg= 1 tab(s), PO, Daily    oxacillin    Protonix, 40 mg= 1 tab(s), PO, BID    senna, 8.6 mg= 1 tab(s), PO, BID    Spiriva, 18 mcg= 1 cap(s), INHAL, Daily    traMADol, 50 mg= 1 tab(s), PO, q4hr, PRN    Vitamin B12, 500 mcg= 0.5 tab(s), PO, Daily    Vitamin D3, 1000 Unit(s)= 1 tab(s), PO, Daily    Lab Results    Glucose Lvl: 75 mg/dL (51/76/16 07:37:10)    BUN: 32 mg/dL High (62/69/48 54:62:70)    Creatinine: 1.63 mg/dL High (35/00/93 81:82:99)    Afn Amer Glomerular Filtration Rate: 38 ml/min/1.49m2 (10/01/17 05:53:00)    Non-Afn Amer Glomerular Filtration Rate: 33 ml/min/1.28m2 (10/01/17 05:53:00)    Sodium Lvl: 144 mmol/L (10/01/17 05:53:00)    Potassium Lvl: 3.6 mmol/L (10/01/17 05:53:00)    Chloride: 103 mmol/L (10/01/17 05:53:00)    CO2:  30 mmol/L (10/01/17 05:53:00)    Anion Gap: 11 (10/01/17 05:53:00)    Calcium Lvl: 7.6 mg/dL Low (37/16/96 78:93:81)    WBC: 6.5 thous/mm3 (10/01/17 05:53:00)    RBC: 2.32 Mil/mm3 Low (10/01/17 05:53:00)    Hgb: 7.8 Gm/dL Low (01/75/10 25:85:27)    Hct: 24.1 % Low (10/01/17 05:53:00)    Platelet: 289 thous/mm3 (10/01/17 05:53:00)    MCV: 103.9 fL High (10/01/17 05:53:00)    MCH: 33.6 pGm (10/01/17 05:53:00)    MCHC: 32.4 Gm/dL (78/24/23 53:61:44)    RDW-SD: 93.7 fL High (10/01/17 05:53:00)    MPV: 8.9 fL Low (10/01/17 05:53:00)    Absolute Neutro Count: 4.44 thous/mm3 (10/01/17 05:53:00)    Absolute Lymphs Count: 1.44 thous/mm3 (10/01/17 05:53:00)    Absolute Mono Count: 0.55 thous/mm3 (10/01/17 05:53:00)    Absolute Eos Count: 0.06 thous/mm3 (10/01/17 05:53:00)    Absolute Baso Count: 0.01 thous/mm3 (10/01/17 05:53:00)    Neutrophils: 67.9 % (  10/01/17 05:53:00)    Lymphocytes: 22.1 % (10/01/17 05:53:00)    Monocytes: 8.4 % (10/01/17 05:53:00)    Eosinophils: 0.9 % (10/01/17 05:53:00)    Basophils: 0.2 % (10/01/17 05:53:00)    Immature Granulocytes: 0.5 % (10/01/17 05:53:00)    NRBC Percent: 0 % (10/01/17 05:53:00)    Absolute NRBC Count: 0 thous/mm3 (10/01/17 05:53:00)    Hypochromia: 1+ Abnormal (10/01/17 05:53:00)    Anisocytosis: 1+ Abnormal (10/01/17 05:53:00)    Microcytes: 1+ Abnormal (10/01/17 05:53:00)    Macrocytes: 1+ Abnormal (10/01/17 05:53:00)    Target Cells: 1+ Abnormal (10/01/17 05:53:00)    Platelet Morph: Enlarged Abnormal (10/01/17 05:53:00)    Diagnostic Results    Impression and Plan    # Unwitnessed fall at rehab.    # Chronic LE edema    # UTI    # Mild hypokalemia    # recent MSSA osteomyelitis with chronic back pain.    # CKD III    # Multiple skin excoriations throughout mostly in legs and upper chest. [1]        Fall likely mechanical,    PT eval    UTI curine culture shows gram neg rods start on iv ceftriaxone    Recent MSSA osteomyelitis, epidural abscess, psoas abscess, cont with  abx  oxacillin as per original plan 6 weeks from 09/08/2017    CKD stage III creatinine stable cont    sc heparin for DVT prophylaxis    [1] Admission H & P; Miniati MD, Gordy Councilman 09/30/2017 12:12 EDT    SIGNATURE LINE Electronically signed by Einar Gip MD, Eyden Dobie on 10/01/2017 at  11:46:31 EST

## 2017-09-29 NOTE — Progress Notes (Signed)
Subjective    patient shows no signs of bleeding, blood loss. abd is slightly distended.        Patient seen and examined at bedside.    Labs , imaging, and notes reviewed.    Discussed case with RN    Review of Systems    Objective    Vitals & Measurements    **T:** 98.1 F (Oral) **TMIN:** 98.1 F (Oral) **TMAX:** 98.3 F (Oral)  **HR:** 88 (Peripheral Pulse) **RR:** 18 **BP:** 107/67 **SpO2:** 98% **O2  Method (L/min):** Room air    Physical Exam    General: Alert, frail, elderly, quite deconditioned.    Skin: Warm, dry, pink, multiple areas of excoriations bilateral LE without  warmth, drainage, crepitus or fluctuance.    Head: Normocephalic, atraumatic.    Neck: Supple, trachea midline, no tenderness, no step offs, full range of  motion, no stiffness, no painful range of motion, no crepitus.    Eye: Pupils are equal, round and reactive to light, extraocular movements are  intact, normal conjunctiva.    Ears, nose, mouth and throat: Oral mucosa moist, no pharyngeal erythema or  exudate.    Cardiovascular: Regular rate and rhythm, No murmur, Normal peripheral  perfusion, moderate BLE edema.    Respiratory: Lungs are clear to auscultation, respirations are non-labored,  breath sounds are equal, Symmetrical chest wall expansion.    Back: No step-offs, No crepitus/deformity. Minimal vague tenderness lower  lumbar area. No step-offs.    Musculoskeletal: No tenderness, no deformity.    Gastrointestinal: Soft, Nontender, Non distended, Normal bowel sounds.    Neurological: Awake, alert, slightly confused, overall quite deconditioned,  4/5 strength BUE/BLE.    Psychiatric: Mood and affect: Flat.  [1]    Medications    _Inpatient_    acetaminophen 325 mg oral tablet, 650 mg= 2 tab(s), PO, q4hr, PRN    albuterol 2.5 mg/3 mL (0.083%) inhalation solution, 2.5 mg= 3 mL, NEB, q4hr,  PRN    amitriptyline, 100 mg= 4 tab(s), PO, HS    bacitracin 500 units/g topical ointment, 1 app, TOP, ud, PRN    budesonide 0.5 mg/2 mL  inhalation suspension, 0.5 mg= 2 mL, NEB, BID    cefTRIAXone    Cymbalta, 60 mg= 2 cap(s), PO, Daily    folic acid, 1 mg= 1 tab(s), PO, Daily    formoterol 20 mcg/2 mL inhalation solution, 20 mcg= 2 mL, NEB, BID    heparin, 5000 Unit(s)= 1 mL, sc, q12hr    labetalol, 50 mg= 0.5 tab(s), PO, BID    Lasix, 40 mg= 1 tab(s), PO, Daily    Latuda, 120 mg= 6 tab(s), PO, HS    LORazepam, 0.5 mg= 1 tab(s), PO, q6hr, PRN    OLANZapine odt, 5 mg= 1 tab(s), PO, Daily    oxacillin    Potassium Chloride Oral, 40 mEq= 2 tab(s), PO, q4hr    Protonix, 40 mg= 1 tab(s), PO, BID    senna, 8.6 mg= 1 tab(s), PO, BID    Sodium Chloride 0.9% Flush, 10 mL, Flush, ud, PRN    Sodium Chloride 0.9% Flush, 10 mL, Flush, q12hr    Spiriva, 18 mcg= 1 cap(s), INHAL, Daily    traMADol, 50 mg= 1 tab(s), PO, q4hr, PRN    Vitamin B12, 500 mcg= 0.5 tab(s), PO, Daily    Vitamin D3, 1000 Unit(s)= 1 tab(s), PO, Daily    ZyPREXA, 5 mg= 0.5 EA, IM, Once, PRN    Lab Results    Glucose Lvl: 77  mg/dL (16/10/96 04:54:09)    BUN: 23 mg/dL (81/19/14 78:29:56)    Creatinine: 1.37 mg/dL High (21/30/86 57:84:69)    Afn Amer Glomerular Filtration Rate: 48 ml/min/1.65m2 (10/03/17 05:11:00)    Non-Afn Amer Glomerular Filtration Rate: 42 ml/min/1.56m2 (10/03/17 05:11:00)    Sodium Lvl: 143 mmol/L (10/03/17 06:56:00)    Potassium Lvl: 2.6 mmol/L Critical (10/03/17 06:56:00)    Chloride: 102 mmol/L (10/03/17 06:56:00)    CO2: 28 mmol/L (10/03/17 06:56:00)    Anion Gap: 13 High (10/03/17 06:56:00)    Total Protein: 6.4 Gm/dL (62/95/28 41:32:44)    Albumin Lvl: 1.4 Gm/dL Low (07/06/70 53:66:44)    Calcium Lvl: 7.7 mg/dL Low (03/47/42 59:56:38)    Magnesium Lvl: 1.7 mg/dL (75/64/33 29:51:88)    Bilirubin Total: 0.6 mg/dL (41/66/06 30:16:01)    Alkaline Phosphatase: 188 Units/L High (10/03/17 06:56:00)    AST: 25 Units/L (10/03/17 06:56:00)    ALT: 10 Units/L (10/03/17 06:56:00)    WBC: 8.1 thous/mm3 (10/03/17 06:56:00)    RBC: 2.31 Mil/mm3 Low (10/03/17 06:56:00)    Hgb: 7.8  Gm/dL Low (09/32/35 57:32:20)    Hct: 24 % Low (10/03/17 06:56:00)    Platelet: 295 thous/mm3 (10/03/17 06:56:00)    MCV: 103.9 fL High (10/03/17 06:56:00)    MCH: 33.8 pGm (10/03/17 06:56:00)    MCHC: 32.5 Gm/dL (25/42/70 62:37:62)    RDW-SD: 87.6 fL High (10/03/17 06:56:00)    MPV: 9 fL Low (10/03/17 06:56:00)    Absolute Neutro Count: 6.26 thous/mm3 (10/03/17 06:56:00)    Absolute Lymphs Count: 1.17 thous/mm3 (10/03/17 06:56:00)    Absolute Mono Count: 0.58 thous/mm3 (10/03/17 06:56:00)    Absolute Eos Count: 0.06 thous/mm3 (10/03/17 06:56:00)    Absolute Baso Count: 0.02 thous/mm3 (10/03/17 06:56:00)    Neutrophils: 77 % (10/03/17 06:56:00)    Lymphocytes: 14.4 % (10/03/17 06:56:00)    Monocytes: 7.1 % (10/03/17 06:56:00)    Eosinophils: 0.7 % (10/03/17 06:56:00)    Basophils: 0.2 % (10/03/17 06:56:00)    Immature Granulocytes: 0.6 % (10/03/17 06:56:00)    NRBC Percent: 0 % (10/03/17 06:56:00)    Absolute NRBC Count: 0 thous/mm3 (10/03/17 06:56:00)    Hypochromia: 1+ Abnormal (10/03/17 05:11:00)    Polychrom: 1+ Abnormal (10/03/17 06:56:00)    Anisocytosis: 1+ Abnormal (10/03/17 06:56:00)    Microcytes: 1+ Abnormal (10/03/17 05:11:00)    Macrocytes: 1+ Abnormal (10/03/17 06:56:00)    Target Cells: 1+ Abnormal (10/03/17 05:11:00)    Platelet Morph: Enlarged Abnormal (10/03/17 06:56:00)    Diagnostic Results    Impression and Plan            67 year old female with a history of heart failure, COPD, hypertension,  chronic kidney disease who was admitted earlier in March for altered mental  status. She ended up developing sepsis with gram-positive bacteremia. Source  of the sepsis was unclear and ID was consulted. She had multiple wounds on her  lower extremities and also had some low back pain. 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 PICC line was placed she was discharge in  stable condition with plans to continue  oxacillin. No clear endocarditis on  the transthoracic echocardiogram. Evening of admission.  she was found on the  floor. Staff thinks she may of been on the floor for about 4 hours or so.            # Unwitnessed fall at rehab.    check carotid  ultrasound,    no overt sign of injury,specific area of pain after secondary eval.            Altered mental status due to toxic metaboli encephalopathy due to UTI,  klebsiella    - cont Rocephin, follow cultures and sensitivity        # recent MSSA osteomyelitis with chronic back pain.    cont oxacillin as ordered and outlined in ID note.    per original plan 6 weeks from 09/08/2017        # Chronic LE edema            Hypokalemia    - replete orally, and K repeat level at 6pm tonight.        # CKD III    stable..            # Multiple skin excoriations throughout mostly in legs and upper chest. [1]    stable,no sign of new ssti.            Heparin : dvt ppx.            Cardiac Solid Diet - Ordered    -- 09/30/17 8:28:00 EDT, Room Service, DYSPHAGIA ADVANCED, thin liquids,  Scheduled / PRN            Code Status - Ordered    -- 09/30/17 8:13:00 EDT, Full Resuscitation, Constant Order        PT eval, and pending dc if potassium level improved. nutrition consult for  supsected malnutrition.    [1] Admission H & P; Miniati MD, Gordy Councilman 09/30/2017 12:12 EDT    [2] Progress/SOAP Note; [place] MD, Daleen Bo 10/02/2017 11:54 EDT    SIGNATURE LINE Electronically signed by Hoy Register MD, Lamaya Hyneman on 10/03/2017 at  12:05:02 EST

## 2017-09-29 NOTE — Discharge Summary (Signed)
Date of Admission    10/02/2017    Date of Discharge    10/04/2017    Admission History    67 year old Leah [name] with a history of heart failure, COPD, hypertension,  chronic kidney disease, admitted with altered mental status of multifactorial  etiology including infectious, electrolyte abnormalities. See admission H&P  and Hospital Course for details.    Code Status    Code Status - Ordered    -- 09/30/17 8:13:00 EDT, Full Resuscitation, Constant Order    Allergies    NKA    Social History    _Alcohol_    Current, 1-2 times per week    Past    _Substance Abuse_    Never    _Tobacco_    Former smoker, Cigarettes    Hospital Course    67 year old Leah [name] with a history of heart failure, COPD, hypertension,  chronic kidney disease who was admitted earlier in March for altered mental  status. She ended up developing sepsis with gram-positive bacteremia. Source  of the sepsis was unclear and ID was consulted. She had multiple wounds on her  lower extremities and also had some low back pain. 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 PICC line was placed she was discharge in  stable condition with plans to continue oxacillin. No clear endocarditis on  the transthoracic echocardiogram. Evening of admission. she was found on the  floor. Staff thinks she may of been on the floor for about 4 hours or so.      1) Unwitnessed fall at rehab.    check carotid ultrasound,    no overt sign of injury,specific area of pain after secondary eval.      2) Altered mental status due to toxic metabolic encephalopathy;    Has improved. Mental status at baseline by time of d/c.      3) Recent MSSA osteomyelitis with chronic back pain.    cont oxacillin as ordered and outlined in ID note.    per original plan 6 weeks from 09/08/2017.    =>4/2; Has completed 3 weeks of IV Oxacillin. Discharging with 3 mores weeks.      4)Chronic LE edema;    continue  Lasix.      5) Hypokalemia    - replete orally, and K repeat level at 6pm tonight.    Corrected. Needs continuous monitoring and correction of lytes      6) CKD III    Stable. Renal function at baseline.      7) Multiple skin excoriations throughout mostly in legs and upper chest. [1]    stable,no sign of new sites.      Procedures and Treatment Provided    Physical Exam    Vitals & Measurements    **T:** 98.4 F (Oral) **TMIN:** 98.1 F (Oral) **TMAX:** 99.2 F (Oral)  **HR:** 79 (Peripheral Pulse) **RR:** 18 **BP:** 107/72 **SpO2:** 98% **O2  Method (L/min):** Room air    GEN; A, O X 2, no acute distress    HEENT: AT/NC, PERRLA, EOMI'    NECK Supple, No JVD    CV: RRR, equal bilateral palpable pulses    LUNGS: CTA (B), symmetrical chest expansion, no rales, no wheeze    ABD: Soft, nontender, nondistended, normoactive bowel sounds, no palpable mass    EXT: No edema    NEURO: Nonfocal;    MUSC/SKEL: Preserved ROM    SKIN: No rash,  no new concerning skin lesion    Lab Results    Glucose Lvl: 71 mg/dL (09/81/19 14:78:29)    BUN: 23 mg/dL (56/21/30 86:57:84)    Creatinine: 1.37 mg/dL High (69/62/95 28:41:32)    Sodium Lvl: 143 mmol/L (10/04/17 12:28:00)    Potassium Lvl: 3.5 mmol/L Low (10/04/17 12:28:00)    Chloride: 103 mmol/L (10/04/17 12:28:00)    CO2: 29 mmol/L (10/04/17 12:28:00)    Anion Gap: 11 (10/04/17 12:28:00)    Calcium Lvl: 7.7 mg/dL Low (Leah/01/02 72:53:66)    WBC: 7.6 thous/mm3 (10/04/17 12:28:00)    RBC: 2.11 Mil/mm3 Low (10/04/17 12:28:00)    Hgb: 7.1 Gm/dL Low (Leah/03/47 42:59:56)    Hct: 22.2 % Low (10/04/17 12:28:00)    Platelet: 272 thous/mm3 (10/04/17 12:28:00)    MCV: 105.2 fL High (10/04/17 12:28:00)    MCH: 33.6 pGm (10/04/17 12:28:00)    MCHC: 32 Gm/dL (38/75/64 33:29:51)    RDW-SD: 91.3 fL High (10/04/17 12:28:00)    MPV: 8.8 fL Low (10/04/17 12:28:00)    NRBC Percent: 0 % (10/04/17 12:28:00)    Absolute NRBC Count: 0 thous/mm3 (10/04/17 12:28:00)    Discharge Diagnoses    See admission H&P  and Hospital Course    Discharge Medications    _Discharge_    acetaminophen 325 mg oral tablet, 650 mg, PO, q4hr, PRN    albuterol 2.5 mg/3 mL (0.083%) inhalation solution, 2.5 mg= 3 mL, NEB, q4hr,  PRN    amitriptyline, 100 mg, PO, HS    Breo Ellipta 100 mcg-25 mcg/inh inhalation powder, 1 puff(s), INHAL, Daily    ciprofloxacin 250 mg oral tablet, 250 mg= 1 tab(s), PO, BID    Culturelle Digestive Health oral capsule, 1 cap(s), PO, BID    Cymbalta 60 mg oral delayed release capsule, 60 mg= 1 cap(s), PO, Daily    docusate sodium 100 mg oral capsule, 100 mg= 1 cap(s), PO, BID, PRN, 2 refills    ferrous gluconate 240 mg (27 mg elemental iron) oral tablet, 240 mg, PO, BID    folic acid 1 mg oral tablet, 1 mg= 1 tab(s), PO, Daily    labetalol 100 mg oral tablet, 50 mg= 0.5 tab(s), PO, BID    Lasix 40 mg oral tablet, 40 mg, PO, Daily, 2 refills    Latuda, 120 mg, PO, HS, evening    LORazepam 0.5 mg oral tablet, 0.5 mg, PO, q6hr, PRN    OLANZapine 5 mg oral tablet, disintegrating, 5 mg= 1 tab(s), PO, Daily    oxacillin 2 g injection, 2000 mg, IV, q4hr    Potassium Chloride Oral, 20 mEq, PO, BID    Protonix, 40 mg, PO, BID    senna 8.6 mg oral tablet, 8.6 mg= 1 tab(s), PO, BID    Spiriva 18 mcg inhalation capsule, 18 mcg= 1 cap(s), INHAL, Daily    traMADol 50 mg oral tablet, 50 mg, PO, q4hr, PRN    Vitamin B12 250 mcg oral tablet, 500 mcg, PO, Daily    Vitamin D3, 1000 Unit(s), PO, Daily    Discharge Instructions    Disposition: Discharge to SNF    Diet: Heart Healthy Diet    Activity: As tolerated with assistance    Followup: With ID within 1-2 weeks    Face to Face    Total Discharge time :35 minutes    SIGNATURE LINE Electronically signed by Daryll Brod MD    SIGNATURE LINE Electronically signed by Daryll Brod MD on 10/04/2017 at  14:20:20 EST

## 2017-09-30 LAB — HX URINE DIPSTICK W/REFLEX
CASE NUMBER: 2019087003936
HX UA BILIRUBIN: NEGATIVE — NL
HX UA GLUCOSE: NEGATIVE — NL
HX UA GRANULAR CAST: 3 /LPF — ABNORMAL HIGH
HX UA KETONES: NEGATIVE — NL
HX UA LEUKOCYTE ESTERASE: 250 WBC/uL — AB
HX UA NITRITE: NEGATIVE — NL
HX UA PH: 5 — NL (ref 5.0–8.0)
HX UA PROTEIN: NEGATIVE — NL
HX UA RBC: <1 — NL (ref 0.0–2.0)
HX UA SPECIFIC GRAVITY: 1.01 — NL (ref 1.003–1.03)
HX UA SQUAMOUS EPITHELIAL: <1 — NL (ref 0.0–5.0)
HX UA UROBILINOGEN: NEGATIVE — NL
HX UA WBC: 36 /HPF — ABNORMAL HIGH (ref 0.0–5.0)

## 2017-09-30 LAB — HX CBC W/ DIFF
CASE NUMBER: 2019088000260
HX ABSOLUTE NRBC COUNT: 0 10*3/uL
HX HCT: 25.6 % — ABNORMAL LOW (ref 36.0–47.0)
HX HGB: 8.1 g/dL — ABNORMAL LOW (ref 11.8–16.0)
HX MCH: 33.2 pg — NL (ref 26.0–34.0)
HX MCHC: 31.6 g/dL — NL (ref 31.0–37.0)
HX MCV: 104.9 fL — ABNORMAL HIGH (ref 80.0–100.0)
HX MPV: 9.2 fL — ABNORMAL LOW (ref 9.4–12.4)
HX NRBC PERCENT: 0 % — NL
HX PLATELET: 318 10*3/uL — NL (ref 150.0–400.0)
HX RBC: 2.44 10*6/uL — ABNORMAL LOW (ref 3.9–5.2)
HX RDW-CV: 25 % — ABNORMAL HIGH (ref 11.5–14.5)
HX RDW-SD: 94.4 fL — ABNORMAL HIGH (ref 35.0–51.0)
HX WBC: 8.2 10*3/uL — NL (ref 3.7–11.2)

## 2017-09-30 LAB — HX .AUTOMATED DIFF
CASE NUMBER: 2019088000260
HX ABSOLUTE BASO COUNT: 0.02 10*3/uL — NL (ref 0.0–0.22)
HX ABSOLUTE EOS COUNT: 0.09 10*3/uL — NL (ref 0.0–0.45)
HX ABSOLUTE LYMPHS COUNT: 1.74 10*3/uL — NL (ref 0.74–5.04)
HX ABSOLUTE MONO COUNT: 0.6 10*3/uL — NL (ref 0.0–1.34)
HX ABSOLUTE NEUTRO COUNT: 5.77 10*3/uL — NL (ref 1.48–7.95)
HX BASOPHILS: 0.2 %
HX EOSINOPHILS: 1.1 %
HX IMMATURE GRANULOCYTES: 0.4 % — NL (ref 0.0–2.0)
HX LYMPHOCYTES: 21.1 %
HX MONOCYTES: 7.3 %
HX NEUTROPHILS: 69.9 %

## 2017-09-30 LAB — HX BASIC METABOLIC PANEL
CASE NUMBER: 2019088000760
HX ANION GAP: 12 — ABNORMAL HIGH (ref 3.0–11.0)
HX BUN: 34 mg/dL — ABNORMAL HIGH (ref 8.0–23.0)
HX CALCIUM LVL: 7.7 mg/dL — ABNORMAL LOW (ref 8.5–10.5)
HX CHLORIDE: 103 mmol/L — NL (ref 98.0–110.0)
HX CO2: 28 mmol/L — NL (ref 21.0–32.0)
HX CREATININE: 1.66 mg/dL — ABNORMAL HIGH (ref 0.55–1.3)
HX GLUCOSE LVL: 80 mg/dL — NL (ref 70.0–110.0)
HX POTASSIUM LVL: 3 mmol/L — ABNORMAL LOW (ref 3.6–5.2)
HX SODIUM LVL: 143 mmol/L — NL (ref 136.0–146.0)

## 2017-09-30 LAB — HX TROPONIN I
CASE NUMBER: 17752676
HX TROPONIN I: <0.015 — NL (ref 0.015–0.045)

## 2017-09-30 LAB — HX .RBC MORPHOLOGY: CASE NUMBER: 2019088000260

## 2017-09-30 LAB — HX GLOMERULAR FILTRATION RATE (ESTIMATED)
CASE NUMBER: 2019088000760
HX AFN AMER GLOMERULAR FILTRATION RATE: 37 mL/min/{1.73_m2}
HX NON-AFN AMER GLOMERULAR FILTRATION RATE: 32 mL/min/{1.73_m2}

## 2017-10-01 LAB — HX CBC W/ DIFF
CASE NUMBER: 2019089000343
HX ABSOLUTE NRBC COUNT: 0 10*3/uL
HX HCT: 24.1 % — ABNORMAL LOW (ref 36.0–47.0)
HX HGB: 7.8 g/dL — ABNORMAL LOW (ref 11.8–16.0)
HX MCH: 33.6 pg — NL (ref 26.0–34.0)
HX MCHC: 32.4 g/dL — NL (ref 31.0–37.0)
HX MCV: 103.9 fL — ABNORMAL HIGH (ref 80.0–100.0)
HX MPV: 8.9 fL — ABNORMAL LOW (ref 9.4–12.4)
HX NRBC PERCENT: 0 % — NL
HX PLATELET: 289 10*3/uL — NL (ref 150.0–400.0)
HX RBC: 2.32 10*6/uL — ABNORMAL LOW (ref 3.9–5.2)
HX RDW-CV: 24.8 % — ABNORMAL HIGH (ref 11.5–14.5)
HX RDW-SD: 93.7 fL — ABNORMAL HIGH (ref 35.0–51.0)
HX WBC: 6.5 10*3/uL — NL (ref 3.7–11.2)

## 2017-10-01 LAB — HX BASIC METABOLIC PANEL
CASE NUMBER: 2019089000343
HX ANION GAP: 11 — NL (ref 3.0–11.0)
HX BUN: 32 mg/dL — ABNORMAL HIGH (ref 8.0–23.0)
HX CALCIUM LVL: 7.6 mg/dL — ABNORMAL LOW (ref 8.5–10.5)
HX CHLORIDE: 103 mmol/L — NL (ref 98.0–110.0)
HX CO2: 30 mmol/L — NL (ref 21.0–32.0)
HX CREATININE: 1.63 mg/dL — ABNORMAL HIGH (ref 0.55–1.3)
HX GLUCOSE LVL: 75 mg/dL — NL (ref 70.0–110.0)
HX POTASSIUM LVL: 3.6 mmol/L — NL (ref 3.6–5.2)
HX SODIUM LVL: 144 mmol/L — NL (ref 136.0–146.0)

## 2017-10-01 LAB — HX .RBC MORPHOLOGY: CASE NUMBER: 2019089000343

## 2017-10-01 LAB — HX .AUTOMATED DIFF
CASE NUMBER: 2019089000343
HX ABSOLUTE BASO COUNT: 0.01 10*3/uL — NL (ref 0.0–0.22)
HX ABSOLUTE EOS COUNT: 0.06 10*3/uL — NL (ref 0.0–0.45)
HX ABSOLUTE LYMPHS COUNT: 1.44 10*3/uL — NL (ref 0.74–5.04)
HX ABSOLUTE MONO COUNT: 0.55 10*3/uL — NL (ref 0.0–1.34)
HX ABSOLUTE NEUTRO COUNT: 4.44 10*3/uL — NL (ref 1.48–7.95)
HX BASOPHILS: 0.2 %
HX EOSINOPHILS: 0.9 %
HX IMMATURE GRANULOCYTES: 0.5 % — NL (ref 0.0–2.0)
HX LYMPHOCYTES: 22.1 %
HX MONOCYTES: 8.4 %
HX NEUTROPHILS: 67.9 %

## 2017-10-01 LAB — HX GLOMERULAR FILTRATION RATE (ESTIMATED)
CASE NUMBER: 2019089000343
HX AFN AMER GLOMERULAR FILTRATION RATE: 38 mL/min/{1.73_m2}
HX NON-AFN AMER GLOMERULAR FILTRATION RATE: 33 mL/min/{1.73_m2}

## 2017-10-03 LAB — HX COMPREHENSIVE METABOLIC PANEL
CASE NUMBER: 2019091000452
HX ALBUMIN LVL: 1.4 g/dL — ABNORMAL LOW (ref 3.2–5.0)
HX ALKALINE PHOSPHATASE: 188 U/L — ABNORMAL HIGH (ref 30.0–117.0)
HX ALT: 10 U/L — NL (ref 6.0–55.0)
HX ANION GAP: 13 — ABNORMAL HIGH (ref 3.0–11.0)
HX AST: 25 U/L — NL (ref 6.0–40.0)
HX BILIRUBIN TOTAL: 0.6 mg/dL — NL (ref 0.2–1.2)
HX BUN: 23 mg/dL — NL (ref 8.0–23.0)
HX CALCIUM LVL: 7.7 mg/dL — ABNORMAL LOW (ref 8.5–10.5)
HX CHLORIDE: 102 mmol/L — NL (ref 98.0–110.0)
HX CO2: 28 mmol/L — NL (ref 21.0–32.0)
HX CREATININE: 1.37 mg/dL — ABNORMAL HIGH (ref 0.55–1.3)
HX GLUCOSE LVL: 77 mg/dL — NL (ref 70.0–110.0)
HX POTASSIUM LVL: 2.6 mmol/L — CR (ref 3.6–5.2)
HX SODIUM LVL: 143 mmol/L — NL (ref 136.0–146.0)
HX TOTAL PROTEIN: 6.4 g/dL — NL (ref 6.0–8.4)

## 2017-10-03 LAB — HX CBC W/ DIFF
CASE NUMBER: 2019091000172
CASE NUMBER: 2019091000452
HX ABSOLUTE NRBC COUNT: 0 10*3/uL
HX ABSOLUTE NRBC COUNT: 0 10*3/uL
HX HCT: 21.2 % — ABNORMAL LOW (ref 36.0–47.0)
HX HCT: 24 % — ABNORMAL LOW (ref 36.0–47.0)
HX HGB: 6.9 g/dL — CR (ref 11.8–16.0)
HX HGB: 7.8 g/dL — ABNORMAL LOW (ref 11.8–16.0)
HX MCH: 33.2 pg — NL (ref 26.0–34.0)
HX MCH: 33.8 pg — NL (ref 26.0–34.0)
HX MCHC: 32.5 g/dL — NL (ref 31.0–37.0)
HX MCHC: 32.5 g/dL — NL (ref 31.0–37.0)
HX MCV: 101.9 fL — ABNORMAL HIGH (ref 80.0–100.0)
HX MCV: 103.9 fL — ABNORMAL HIGH (ref 80.0–100.0)
HX MPV: 8.7 fL — ABNORMAL LOW (ref 9.4–12.4)
HX MPV: 9 fL — ABNORMAL LOW (ref 9.4–12.4)
HX NRBC PERCENT: 0 % — NL
HX NRBC PERCENT: 0 % — NL
HX PLATELET: 229 10*3/uL — NL (ref 150.0–400.0)
HX PLATELET: 295 10*3/uL — NL (ref 150.0–400.0)
HX RBC: 2.08 10*6/uL — ABNORMAL LOW (ref 3.9–5.2)
HX RBC: 2.31 10*6/uL — ABNORMAL LOW (ref 3.9–5.2)
HX RDW-CV: 23.2 % — ABNORMAL HIGH (ref 11.5–14.5)
HX RDW-CV: 23.2 % — ABNORMAL HIGH (ref 11.5–14.5)
HX RDW-SD: 85 fL — ABNORMAL HIGH (ref 35.0–51.0)
HX RDW-SD: 87.6 fL — ABNORMAL HIGH (ref 35.0–51.0)
HX WBC: 6.8 10*3/uL — NL (ref 3.7–11.2)
HX WBC: 8.1 10*3/uL — NL (ref 3.7–11.2)

## 2017-10-03 LAB — HX .AUTOMATED DIFF
CASE NUMBER: 2019091000172
CASE NUMBER: 2019091000452
HX ABSOLUTE BASO COUNT: 0.01 10*3/uL — NL (ref 0.0–0.22)
HX ABSOLUTE BASO COUNT: 0.02 10*3/uL — NL (ref 0.0–0.22)
HX ABSOLUTE EOS COUNT: 0.05 10*3/uL — NL (ref 0.0–0.45)
HX ABSOLUTE EOS COUNT: 0.06 10*3/uL — NL (ref 0.0–0.45)
HX ABSOLUTE LYMPHS COUNT: 1.01 10*3/uL — NL (ref 0.74–5.04)
HX ABSOLUTE LYMPHS COUNT: 1.17 10*3/uL — NL (ref 0.74–5.04)
HX ABSOLUTE MONO COUNT: 0.54 10*3/uL — NL (ref 0.0–1.34)
HX ABSOLUTE MONO COUNT: 0.58 10*3/uL — NL (ref 0.0–1.34)
HX ABSOLUTE NEUTRO COUNT: 5.11 10*3/uL — NL (ref 1.48–7.95)
HX ABSOLUTE NEUTRO COUNT: 6.26 10*3/uL — NL (ref 1.48–7.95)
HX BASOPHILS: 0.1 %
HX BASOPHILS: 0.2 %
HX EOSINOPHILS: 0.7 %
HX EOSINOPHILS: 0.7 %
HX IMMATURE GRANULOCYTES: 0.4 % — NL (ref 0.0–2.0)
HX IMMATURE GRANULOCYTES: 0.6 % — NL (ref 0.0–2.0)
HX LYMPHOCYTES: 15 %
HX LYMPHOCYTES: 14.4 %
HX MONOCYTES: 8 %
HX MONOCYTES: 7.1 %
HX NEUTROPHILS: 75.8 %
HX NEUTROPHILS: 77 %

## 2017-10-03 LAB — HX GLOMERULAR FILTRATION RATE (ESTIMATED)
CASE NUMBER: 2019091000172
HX AFN AMER GLOMERULAR FILTRATION RATE: 48 mL/min/{1.73_m2}
HX NON-AFN AMER GLOMERULAR FILTRATION RATE: 42 mL/min/{1.73_m2}

## 2017-10-03 LAB — HX BASIC METABOLIC PANEL
CASE NUMBER: 2019091000172
HX ANION GAP: 13 — ABNORMAL HIGH (ref 3.0–11.0)
HX BUN: 24 mg/dL — ABNORMAL HIGH (ref 8.0–23.0)
HX CALCIUM LVL: 7.1 mg/dL — ABNORMAL LOW (ref 8.5–10.5)
HX CHLORIDE: 101 mmol/L — NL (ref 98.0–110.0)
HX CO2: 28 mmol/L — NL (ref 21.0–32.0)
HX CREATININE: 1.34 mg/dL — ABNORMAL HIGH (ref 0.55–1.3)
HX GLUCOSE LVL: 85 mg/dL — NL (ref 70.0–110.0)
HX POTASSIUM LVL: 2.3 mmol/L — CR (ref 3.6–5.2)
HX SODIUM LVL: 142 mmol/L — NL (ref 136.0–146.0)

## 2017-10-03 LAB — HX .RBC MORPHOLOGY
CASE NUMBER: 2019091000172
CASE NUMBER: 2019091000452

## 2017-10-03 LAB — HX POTASSIUM LEVEL
CASE NUMBER: 2019091002763
HX POTASSIUM LVL: 2.9 mmol/L — ABNORMAL LOW (ref 3.6–5.2)

## 2017-10-03 LAB — HX MAGNESIUM LEVEL
CASE NUMBER: 17762634
HX MAGNESIUM LVL: 1.7 mg/dL — NL (ref 1.7–2.5)

## 2017-10-04 LAB — HX BASIC METABOLIC PANEL
CASE NUMBER: 2019092001760
HX ANION GAP: 11 — NL (ref 3.0–11.0)
HX BUN: 23 mg/dL — NL (ref 8.0–23.0)
HX CALCIUM LVL: 7.7 mg/dL — ABNORMAL LOW (ref 8.5–10.5)
HX CHLORIDE: 103 mmol/L — NL (ref 98.0–110.0)
HX CO2: 29 mmol/L — NL (ref 21.0–32.0)
HX CREATININE: 1.37 mg/dL — ABNORMAL HIGH (ref 0.55–1.3)
HX GLUCOSE LVL: 71 mg/dL — NL (ref 70.0–110.0)
HX POTASSIUM LVL: 3.5 mmol/L — ABNORMAL LOW (ref 3.6–5.2)
HX SODIUM LVL: 143 mmol/L — NL (ref 136.0–146.0)

## 2017-10-04 LAB — HX CBC W/ INDICES
CASE NUMBER: 2019092001760
HX ABSOLUTE NRBC COUNT: 0 10*3/uL
HX HCT: 22.2 % — ABNORMAL LOW (ref 36.0–47.0)
HX HGB: 7.1 g/dL — ABNORMAL LOW (ref 11.8–16.0)
HX MCH: 33.6 pg — NL (ref 26.0–34.0)
HX MCHC: 32 g/dL — NL (ref 31.0–37.0)
HX MCV: 105.2 fL — ABNORMAL HIGH (ref 80.0–100.0)
HX MPV: 8.8 fL — ABNORMAL LOW (ref 9.4–12.4)
HX NRBC PERCENT: 0 % — NL
HX PLATELET: 272 10*3/uL — NL (ref 150.0–400.0)
HX RBC: 2.11 10*6/uL — ABNORMAL LOW (ref 3.9–5.2)
HX RDW-CV: 24.1 % — ABNORMAL HIGH (ref 11.5–14.5)
HX RDW-SD: 91.3 fL — ABNORMAL HIGH (ref 35.0–51.0)
HX WBC: 7.6 10*3/uL — NL (ref 3.7–11.2)

## 2017-10-05 ENCOUNTER — Ambulatory Visit: Admitting: Infectious Disease

## 2017-10-05 NOTE — Progress Notes (Signed)
* * *        **  Leah [name]    --- ---    63 Y old Female, DOB: 1950/12/08    [address] [place], [place] [address]    Home: [phone]    Provider: Rosalio Loud        * * *    Telephone Encounter    ---    Answered by   Juliann Pares  Date: 10/05/2017         Time: 02:47 PM    Reason   No show Wed. 10/05/2017    --- ---                * * *                ---          * * *          Patient: Leah [name], Leah [name] DOB: 12-18-1950 Provider: Rosalio Loud  10/05/2017    ---    Note generated by e[employer]Works EMR/PM Software ([email or web address])

## 2017-10-21 ENCOUNTER — Ambulatory Visit

## 2017-10-25 ENCOUNTER — Inpatient Hospital Stay
Admit: 2017-10-25 | Disposition: A | Discharge status: Skilled Nursing Facility | Source: Home / Self Care | Attending: Internal Medicine | Admitting: Internal Medicine

## 2017-10-25 ENCOUNTER — Ambulatory Visit: Admitting: Emergency Medicine

## 2017-10-25 ENCOUNTER — Ambulatory Visit

## 2017-10-25 LAB — HX COMPREHENSIVE METABOLIC PANEL
CASE NUMBER: 2019113001784
HX ALBUMIN LVL: 1.2 g/dL — ABNORMAL LOW (ref 3.2–5.0)
HX ALKALINE PHOSPHATASE: 170 U/L — ABNORMAL HIGH (ref 30.0–117.0)
HX ALT: 7 U/L — NL (ref 6.0–55.0)
HX ANION GAP: 16 — ABNORMAL HIGH (ref 3.0–11.0)
HX AST: 12 U/L — NL (ref 6.0–40.0)
HX BILIRUBIN TOTAL: 0.4 mg/dL — NL (ref 0.2–1.2)
HX BUN: 34 mg/dL — ABNORMAL HIGH (ref 8.0–23.0)
HX CALCIUM LVL: 8 mg/dL — ABNORMAL LOW (ref 8.5–10.5)
HX CHLORIDE: 99 mmol/L — NL (ref 98.0–110.0)
HX CO2: 22 mmol/L — NL (ref 21.0–32.0)
HX CREATININE: 1.57 mg/dL — ABNORMAL HIGH (ref 0.55–1.3)
HX GLUCOSE LVL: 75 mg/dL — NL (ref 70.0–110.0)
HX POTASSIUM LVL: 2.6 mmol/L — CR (ref 3.6–5.2)
HX SODIUM LVL: 137 mmol/L — NL (ref 136.0–146.0)
HX TOTAL PROTEIN: 5.7 g/dL — ABNORMAL LOW (ref 6.0–8.4)

## 2017-10-25 LAB — HX TROPONIN I
CASE NUMBER: 17865755
HX TROPONIN I: <0.015 — NL (ref 0.015–0.045)

## 2017-10-25 LAB — HX ABO/RH TYPE
CASE NUMBER: 2019113002081
HX ABO/RH TYPE: A POS — NL

## 2017-10-25 LAB — HX CROSSMATCH: CASE NUMBER: 2019113002081

## 2017-10-25 LAB — HX CBC W/ DIFF
CASE NUMBER: 2019113001784
HX ABSOLUTE NRBC COUNT: 0 10*3/uL
HX HCT: 19.8 % — CR (ref 36.0–47.0)
HX HGB: 6.1 g/dL — CR (ref 11.8–16.0)
HX MCH: 34.1 pg — ABNORMAL HIGH (ref 26.0–34.0)
HX MCHC: 30.8 g/dL — ABNORMAL LOW (ref 31.0–37.0)
HX MCV: 110.6 fL — ABNORMAL HIGH (ref 80.0–100.0)
HX MPV: 8.8 fL — ABNORMAL LOW (ref 9.4–12.4)
HX NRBC PERCENT: 0 % — NL
HX PLATELET: 297 10*3/uL — NL (ref 150.0–400.0)
HX RBC: 1.79 10*6/uL — ABNORMAL LOW (ref 3.9–5.2)
HX RDW-CV: 23.5 % — ABNORMAL HIGH (ref 11.5–14.5)
HX RDW-SD: 94.1 fL — ABNORMAL HIGH (ref 35.0–51.0)
HX WBC: 9.3 10*3/uL — NL (ref 3.7–11.2)

## 2017-10-25 LAB — HX .RBC MORPHOLOGY: CASE NUMBER: 2019113001784

## 2017-10-25 LAB — HX LACTIC ACID
CASE NUMBER: 2019113001784
CASE NUMBER: 2019113002306
HX LACTIC ACID LVL: 2.7 mmol/L — ABNORMAL HIGH (ref 0.4–2.0)
HX LACTIC ACID LVL: 2.3 mmol/L — ABNORMAL HIGH (ref 0.4–2.0)

## 2017-10-25 LAB — HX LIPASE LEVEL
CASE NUMBER: 17865755
HX LIPASE LVL: 15 U/L — ABNORMAL LOW (ref 73.0–393.0)

## 2017-10-25 LAB — HX BLUE TOP TO HOLD: CASE NUMBER: 2019113001784

## 2017-10-25 LAB — HX INFLUENZA A&B BY PCR
CASE NUMBER: 2019113001823
HX INFLUENZA A BY PCR: NOT DETECTED
HX INFLUENZA B BY PCR: NOT DETECTED

## 2017-10-25 LAB — HX ANTIBODY SCREEN
CASE NUMBER: 2019113002081
HX ANTIBODY SCREEN AUTOMATED: NEGATIVE — NL

## 2017-10-25 LAB — HX PT
CASE NUMBER: 2019113001784
HX INR: 1.2
HX PT: 11.9 s — ABNORMAL HIGH (ref 9.3–11.6)

## 2017-10-25 LAB — HX PTT
CASE NUMBER: 2019113001784
HX APTT: 23 s — NL (ref 23.0–32.0)

## 2017-10-25 LAB — HX .AUTOMATED DIFF
CASE NUMBER: 2019113001784
HX ABSOLUTE BASO COUNT: 0.01 10*3/uL — NL (ref 0.0–0.22)
HX ABSOLUTE EOS COUNT: 0.03 10*3/uL — NL (ref 0.0–0.45)
HX ABSOLUTE LYMPHS COUNT: 1.42 10*3/uL — NL (ref 0.74–5.04)
HX ABSOLUTE MONO COUNT: 0.59 10*3/uL — NL (ref 0.0–1.34)
HX ABSOLUTE NEUTRO COUNT: 7.19 10*3/uL — NL (ref 1.48–7.95)
HX BASOPHILS: 0.1 %
HX EOSINOPHILS: 0.3 %
HX IMMATURE GRANULOCYTES: 0.4 % — NL (ref 0.0–2.0)
HX LYMPHOCYTES: 15.3 %
HX MONOCYTES: 6.4 %
HX NEUTROPHILS: 77.5 %

## 2017-10-25 LAB — HX GLOMERULAR FILTRATION RATE (ESTIMATED)
CASE NUMBER: 2019113001784
HX AFN AMER GLOMERULAR FILTRATION RATE: 40 mL/min/{1.73_m2}
HX NON-AFN AMER GLOMERULAR FILTRATION RATE: 34 mL/min/{1.73_m2}

## 2017-10-25 LAB — HX MAGNESIUM LEVEL
CASE NUMBER: 2019113001784
HX MAGNESIUM LVL: 1.8 mg/dL — NL (ref 1.7–2.5)

## 2017-10-25 LAB — HX PHOSPHORUS LEVEL
CASE NUMBER: 2019113001784
HX PHOSPHORUS: 3.9 mg/dL — NL (ref 2.4–4.9)

## 2017-10-25 LAB — HX .REDCELL
CASE NUMBER: 2019113002241
HX ADDITIONAL UNITS TO CROSSMATCH: 0 — NL
HX NUMBER OF UNITS TO TRANSFUSE: 2

## 2017-10-25 NOTE — Progress Notes (Signed)
Subjective    No events overnight, afebrile, denies N/V, she has not slept the last 2 days.    Objective    Vitals & Measurements    **T: **98.1 F  (Oral) **TMIN: **97.5 F  (Oral) **TMAX: **98.6 F  (Oral)  **HR: **99 **RR: **21 **BP: **103/59 **SpO2: **100% **O2 Method (L/min):  **Room air **WT: **72 Kg    Physical Exam    General:  Alert and oriented, No acute distress.    Eye:  Pupils are equal, round and reactive to light, Normal conjunctiva.    HENT:  Normocephalic.    Neck:  Supple, Non-tender.    Respiratory:  Lungs are clear to auscultation, Respirations are non-labored.    Cardiovascular:  Normal rate, Regular rhythm, No murmur, No gallop.    Gastrointestinal:  Soft, Non-tender, Non-distended, Normal bowel sounds.    Genitourinary:  No costovertebral angle tenderness, Patient declines groin  exam.    Musculoskeletal: Decreasing LLE erythema.    Integumentary:  Warm, Dry, No rash.    Neurologic:  Alert, Oriented, No focal deficits.    Psychiatric:  Cooperative.    Medications    _Inpatient_    acetaminophen tablet, 650 mg= 2 tab(s), PO, q4hr, PRN    albuterol 2.5 mg/3 mL (0.083%) inhalation solution, 2.5 mg= 3 mL, NEB, q4hr,  PRN    amitriptyline, 100 mg= 2 tab(s), PO, HS    bacitracin 500 units/g topical ointment, 1 app, TOP, ud, PRN    bisacodyl, 10 mg= 1 supp, PR, Daily, PRN    budesonide 0.5 mg/2 mL inhalation suspension, 0.5 mg= 2 mL, NEB, BID    Culturelle Digestive Health oral capsule, 1 cap(s), PO, BID    Cymbalta, 60 mg= 2 cap(s), PO, Daily    docusate sodium, 100 mg= 1 cap(s), PO, BID    ferrous sulfate, 325 mg= 1 tab(s), PO, Daily    folic acid, 1 mg= 1 tab(s), PO, Daily    formoterol 20 mcg/2 mL inhalation solution, 20 mcg= 2 mL, NEB, BID    glycopyrrolate 15.6 mcg inhalation capsule, 15.6 mcg= 1 cap(s), INHAL, BID    guaiFENesin, 100 mg= 5 mL, PO, q4hr, PRN    heparin, 5000 Unit(s)= 1 mL, sc, q12hr    Heparin 10 unit(s)/ml Flush, 5 mL, Flush, q12hr    Latuda, 120 mg= 6 tab(s), PO,  HS    LORazepam, 0.5 mg= 1 tab(s), PO, q6hr, PRN    Maalox, 30 mL, PO, q4hr, PRN    Milk of Magnesia 8% oral suspension, 2400 mg= 30 mL, PO, Daily, PRN    nystatin topical, 1 app, TOP, TID    OLANZapine odt, 5 mg= 1 tab(s), PO, Daily    ondansetron, 4 mg= 2 mL, IV Push, q8hr, PRN    oxyCODONE, 10 mg= 2 tab(s), PO, q6hr, PRN    pantoprazole, 40 mg= 1 tab(s), PO, BID    Sodium Chloride 0.9% Flush, 10 mL, Flush, ud, PRN    Sodium Chloride 0.9% Flush, 10 mL, Flush, q12hr    traMADol, 50 mg= 1 tab(s), PO, q6hr, PRN    vancomycin    VANCOMYCIN PROTOCOL SEE DOSE, 1 EA, N/A, Daily    Vitamin B12, 500 mcg= 0.5 tab(s), PO, Daily    Vitamin D3, 1000 Unit(s)= 1 tab(s), PO, Daily    Zosyn    Lab Results    Potassium Lvl: 4 mmol/L (10/27/17 06:20:00)    WBC: 6.6 thous/mm3 (10/27/17 06:20:00)    RBC: 2.84 Mil/mm3 Low (10/27/17 06:20:00)  Hgb: 9.5 Gm/dL Low (16/10/96 04:54:09)    Hct: 29.3 % Low (10/27/17 06:20:00)    Platelet: 311 thous/mm3 (10/27/17 06:20:00)    MCV: 103.2 fL High (10/27/17 06:20:00)    MCH: 33.5 pGm (10/27/17 06:20:00)    MCHC: 32.4 Gm/dL (81/19/14 78:29:56)    RDW-SD: 86.1 fL High (10/27/17 06:20:00)    MPV: 8.4 fL Low (10/27/17 06:20:00)    NRBC Percent: 0 % (10/27/17 06:20:00)    Absolute NRBC Count: 0 thous/mm3 (10/27/17 06:20:00)    Diagnostic Results    Impression and Plan    Past Medical History    - Bipolar disorder    - Anxiety/depression    - Chronic renal insufficiency    - Hypertension    - History of an esophageal ulcer    - History of choledocholithiasis    - Anemia    - COPD    - CHF        67 year old lady, with the above-mentioned past medical history, presented  from her facility with hypotension and fever.        # SIRS/ possible early sepsis    # LLE cellulitis    # Hx of O2 dependent COPD    # AoC anemia, without evidence of guaiac positive stools    # Hypokalemia    # CKD III    # Hypotension    # Recent MSSA lumbar osteomyelitis on oxacillin        Plan:    C/w vanco and zosyn.    F/up  blood cx, MRSA cx: neg and wound culture.    MRI L/S spine still pending.    Anemia and hypokalemia ? sec to recent bactrim use (started by PCP 1 day ago),  responded appropriately to 2 units pRBC and K repleted.    Stool documented guaiac neg in ER and doubt slow GI bleed.    Monitor daily labs.    Other wise, mentation appears at baseline.    Her COPD does not appear to be in acute exacerbation.    Holding lasix and labetalol in the light of low BP.        may transfer to M/S.        Start HSQ for DVT prophylaxis.    SIGNATURE LINE Electronically signed by Felipa Furnace MD (HOSP), Katelind Pytel on  10/27/2017 at 13:47:19 EST

## 2017-10-25 NOTE — Progress Notes (Signed)
Subjective    Pt seen and examined and chart is reviewed.    Pt c/o increasing pain in LLE.    Afebrile, HD stable.    Denies any N/V/ abd pain.    Objective    Vitals & Measurements    **T: **98.6 F  (Oral) **TMIN: **97.8 F  (Axillary) **TMAX: **98.6 F  (Oral)  **HR: **94 **RR: **14 **BP: **115/93 **SpO2: **100% **O2 Method (L/min):  **Room air **WT: **72 Kg    Physical Exam    General:  Alert and oriented, No acute distress.    Eye:  Pupils are equal, round and reactive to light, Normal conjunctiva.    HENT:  Normocephalic.    Neck:  Supple, Non-tender.    Respiratory:  Lungs are clear to auscultation, Respirations are non-labored.    Cardiovascular:  Normal rate, Regular rhythm, No murmur, No gallop.    Gastrointestinal:  Soft, Non-tender, Non-distended, Normal bowel sounds.    Genitourinary:  No costovertebral angle tenderness, Patient declines groin  exam.    Musculoskeletal: Decreasing LLE erythema.    Integumentary:  Warm, Dry, No rash.    Neurologic:  Alert, Oriented, No focal deficits.    Psychiatric:  Cooperative.    Medications    _Inpatient_    acetaminophen tablet, 650 mg= 2 tab(s), PO, q4hr, PRN    albuterol 2.5 mg/3 mL (0.083%) inhalation solution, 2.5 mg= 3 mL, NEB, q4hr,  PRN    amitriptyline, 100 mg= 2 tab(s), PO, HS    bacitracin 500 units/g topical ointment, 1 app, TOP, ud, PRN    bisacodyl, 10 mg= 1 supp, PR, Daily, PRN    budesonide 0.5 mg/2 mL inhalation suspension, 0.5 mg= 2 mL, NEB, BID    Culturelle Digestive Health oral capsule, 1 cap(s), PO, BID    Cymbalta, 60 mg= 2 cap(s), PO, Daily    docusate sodium, 100 mg= 1 cap(s), PO, BID    ferrous sulfate, 325 mg= 1 tab(s), PO, Daily    folic acid, 1 mg= 1 tab(s), PO, Daily    formoterol 20 mcg/2 mL inhalation solution, 20 mcg= 2 mL, NEB, BID    glycopyrrolate 15.6 mcg inhalation capsule, 15.6 mcg= 1 cap(s), INHAL, BID    guaiFENesin, 100 mg= 5 mL, PO, q4hr, PRN    Heparin 10 unit(s)/ml Flush, 5 mL, Flush, q12hr    Latuda, 120 mg= 6  tab(s), PO, HS    Maalox, 30 mL, PO, q4hr, PRN    Milk of Magnesia 8% oral suspension, 2400 mg= 30 mL, PO, Daily, PRN    nystatin topical, 1 app, TOP, TID    OLANZapine odt, 5 mg= 1 tab(s), PO, Daily    ondansetron, 4 mg= 2 mL, IV Push, q8hr, PRN    pantoprazole, 40 mg= 1 tab(s), PO, BID    Potassium Chloride Oral, 40 mEq= 30 mL, PO, Once    Sodium Chloride 0.9% Flush, 10 mL, Flush, ud, PRN    Sodium Chloride 0.9% Flush, 10 mL, Flush, q12hr    vancomycin    VANCOMYCIN PROTOCOL SEE DOSE, 1 EA, N/A, Daily    Vitamin B12, 500 mcg= 0.5 tab(s), PO, Daily    Vitamin D3, 1000 Unit(s)= 1 tab(s), PO, Daily    Zosyn    Lab Results    Glucose Lvl: 79 mg/dL (21/30/86 57:84:69)    BUN: 29 mg/dL High (62/95/28 41:32:44)    Creatinine: 1.24 mg/dL (07/06/70 53:66:44)    Afn Amer Glomerular Filtration Rate: 53 ml/min/1.70m2 (10/26/17 05:57:00)  Non-Afn Amer Glomerular Filtration Rate: 46 ml/min/1.74m2 (10/26/17 05:57:00)    Sodium Lvl: 143 mmol/L (10/26/17 05:57:00)    Potassium Lvl: 3.5 mmol/L Low (10/26/17 05:57:00)    Chloride: 111 mmol/L High (10/26/17 05:57:00)    CO2: 21 mmol/L (10/26/17 05:57:00)    Anion Gap: 11 (10/26/17 05:57:00)    Total Protein: 5.7 Gm/dL Low (29/56/21 30:86:57)    Albumin Lvl: 1.2 Gm/dL Low (84/69/62 95:28:41)    Calcium Lvl: 7.3 mg/dL Low (32/44/01 02:72:53)    Phosphorus: 3.9 mg/dL (66/44/03 47:42:59)    Magnesium Lvl: 1.8 mg/dL (56/38/75 64:33:29)    Bilirubin Total: 0.4 mg/dL (51/88/41 66:06:30)    Alkaline Phosphatase: 170 Units/L High (10/25/17 13:28:00)    AST: 12 Units/L (10/25/17 13:28:00)    ALT: 7 Units/L (10/25/17 13:28:00)    Lipase Lvl: 15 Units/L Low (10/25/17 13:28:00)    Lactic Acid Lvl: 2.3 mmol/L High (10/25/17 19:59:00)    Troponin I: <0.015 (10/25/17 13:28:00)    C-Reactive Protein: 22.3 mg/dL High (16/01/09 32:35:57)    WBC: 9.4 thous/mm3 (10/26/17 05:57:00)    RBC: 2.53 Mil/mm3 Low (10/26/17 05:57:00)    Hgb: 8.3 Gm/dL Low (32/20/25 42:70:62)    Hct: 25.8 % Low (10/26/17  05:57:00)    Platelet: 304 thous/mm3 (10/26/17 05:57:00)    MCV: 102 fL High (10/26/17 05:57:00)    MCH: 32.8 pGm (10/26/17 05:57:00)    MCHC: 32.2 Gm/dL (37/62/83 15:17:61)    RDW-SD: 82.3 fL High (10/26/17 05:57:00)    MPV: 8.5 fL Low (10/26/17 05:57:00)    Absolute Neutro Count: 7.63 thous/mm3 (10/26/17 05:57:00)    Absolute Lymphs Count: 1.1 thous/mm3 (10/26/17 05:57:00)    Absolute Mono Count: 0.51 thous/mm3 (10/26/17 05:57:00)    Absolute Eos Count: 0.09 thous/mm3 (10/26/17 05:57:00)    Absolute Baso Count: 0.03 thous/mm3 (10/26/17 05:57:00)    Neutrophils: 81.2 % (10/26/17 05:57:00)    Lymphocytes: 11.7 % (10/26/17 05:57:00)    Monocytes: 5.4 % (10/26/17 05:57:00)    Eosinophils: 1 % (10/26/17 05:57:00)    Basophils: 0.3 % (10/26/17 05:57:00)    Immature Granulocytes: 0.4 % (10/26/17 05:57:00)    NRBC Percent: 0 % (10/26/17 05:57:00)    Absolute NRBC Count: 0 thous/mm3 (10/26/17 05:57:00)    Hypochromia: 1+ Abnormal (10/25/17 13:28:00)    Polychrom: 1+ Abnormal (10/26/17 05:57:00)    Anisocytosis: 1+ Abnormal (10/26/17 05:57:00)    Macrocytes: 1+ Abnormal (10/26/17 05:57:00)    Spherocytes: 1+ Abnormal (10/26/17 05:57:00)    Platelet Morph: Enlarged Abnormal (10/26/17 05:57:00)    Sed Rate: 39 mm/hr High (10/26/17 05:57:00)    UA Color: Yellow1 (10/26/17 05:57:00)    UA Clarity: Clear1 (10/26/17 05:57:00)    UA Specific Gravity: 1.014 (10/26/17 05:57:00)    UA pH: 5 (10/26/17 05:57:00)    UA Protein: Negative1 (10/26/17 05:57:00)    UA Glucose: Negative1 (10/26/17 05:57:00)    UA Ketones: Negative1 (10/26/17 05:57:00)    UA Bilirubin: Negative1 (10/26/17 05:57:00)    UA Blood: Negative1 (10/26/17 05:57:00)    UA Urobilinogen: Negative1 (10/26/17 05:57:00)    UA Nitrite: Negative1 (10/26/17 05:57:00)    UA Leukocyte Esterase: 25 Abnormal (10/26/17 05:57:00)    UA RBC: <1 (10/26/17 05:57:00)    UA WBC: 2 /HPF (10/26/17 05:57:00)    UA Squamous Epithelial: 1 /HPF (10/26/17 05:57:00)    UA Bacteria: None1  (10/26/17 05:57:00)    ABO/Rh Automated: A POS (10/25/17 13:28:00)    Antibody Screen Automated: Negative (10/25/17 13:28:00)    Electronic Crossmatch: Computer XM OK (10/25/17  13:28:00)    Electronic Crossmatch: Computer XM OK (10/25/17 13:28:00)    TRANSFUSED: TRANSFUSED (10/25/17 20:44:00)    PT: 11.9 sec High (10/25/17 13:28:00)    INR: 1.2 (10/25/17 13:28:00)    aPTT: 23 sec (10/25/17 13:28:00)    Blue Top Tube To Hold: DONE (10/25/17 13:28:00)    Diagnostic Results    Impression and Plan    Past Medical History    - Bipolar disorder    - Anxiety/depression    - Chronic renal insufficiency    - Hypertension    - History of an esophageal ulcer    - History of choledocholithiasis    - Anemia    - COPD    - CHF        67 year old lady, with the above-mentioned past medical history, presented  from her facility with hypotension and fever.        # SIRS/ possible early sepsis    # LLE cellulitis    # Hx of O2 dependent COPD    # AoC anemia, without evidence of guaiac positive stools    # Hypokalemia    # CKD III    # Hypotension    # Recent MSSA lumbar osteomyelitis on oxacillin        Plan:    Agree with ID to cover broadly with vanco and zosyn for the LLE cellulitis, we  need to mark the erythematous skin area to reference. D/w nursing staff.    F/up blood cx, MRSA cx and wound culture.    MRI L/S spine has been ordered to ensure proper improvement.    Anemia and hypokalemia ? sec to recent bactrim use (started by PCP 1 day ago),  responded appropriately to 2 units pRBC and K repleted.    Stool documented guaiac neg in ER and doubt slow GI bleed.    Monitor daily labs.    Other wise, mentation appears at baseline.    Her COPD does not appear to be in acute exacerbation.    Holding lasix and labetalol in the light of low BP.        C/w tele for today.    SIGNATURE LINE Electronically signed by Felipa Furnace MD (HOSP), Ihsan Nomura on  10/26/2017 at 11:23:32 EST

## 2017-10-25 NOTE — Progress Notes (Signed)
K 3.2 and ordered KCL 40 mEq q4h x2 from this AM. at 2:55 PM, RN Jen from [facility]  called said can only give 30 mEq to her po so far (even MAR marked 2 doses  done).    will need to given anothe 40 mEq KCl via iv as cannot give much on PO    SIGNATURE LINE Electronically signed by Loel Dubonnet MD, Charlene Brooke on 11/01/2017 at  14:57:05 EST

## 2017-10-25 NOTE — Discharge Summary (Signed)
Date of Admission    10/25/2017    Date of Discharge    11/08/2017      Admission History                History of Present Illness    This is a 67 year old lady, with the above-mentioned past medical history,  presented from her facility with hypotension and fever. Of note the patient  was discharged on April 2 after she was hospitalized for unwitnessed fall at  rehabilitation, altered mental status secondary to toxic metabolic  encephalopathy, recent MSSA bacteremia and osteomyelitis on IV oxacillin by  PICC line. The patient is a poor historian,She has no complaints at this  point, denies any respiratory, GI, or irritative urinary symptoms, the history  was taken from emergency department team and transfer paperwork, at her  facility there was a concern of worsening left leg skin cellulitis and  discharges from her chronic wounds, she was started on Bactrim yesterday, she  was noted to have low blood pressure today with fever, she presented to the  ER. In the ER initial blood pressure was 86/58, she received IV fluid  hydration, current blood pressure is 95/62 with map of 73, blood work shows  creatinine of 1.5, sodium 137, potassium 2.6, albumin of 1.2, lactic acid of  2.7, no leukocytosis, hematocrit of 19 from 22 earlier this month, MCV 110,  urine is pending, chest x-ray shows focal opacity in the left midlung could be  atelectasis versus pneumonia, she received a dose of vancomycin and Zosyn, she  is being transfused with 2 units of packed RBCs, she is guaiac-negative, she's  been admitted for further management.    [1]    Code Status    Code Status - Ordered    -- 10/25/17 15:03:00 EDT, Full Resuscitation, Constant Order    Allergies    NKA    Social History    _Alcohol_    Current, 1-2 times per week    Past    _Substance Abuse_    Never    _Tobacco_    Former smoker, Cigarettes    Hospital Course            67 year old female who presents with confusion and reported fever        Sepsis from left leg  cellulitis and L-spine osteomyelitis, with MSSA  bacteremia.    PO bactrim for LLE cellulitis (course complete)    on IV oxacillin and continue clindamycin to augment MSSA spinal osteomyelitis  treatment.    at least another 3 weeks of these from 10/31/17    Weekly CBC w/diff, BUN/Cr, LFTs, and ESR/CRP checked and faxed to Dr. Vickki Muff  at [phone]    c. diff negative, continue probiotics        toxic metabolic encephalopathy \- psych consult appreciated, continue  mentation change likely related with infection.continue current meds.    cont zyprexa.        anemia - no signs of active bleeding. Stool documented guaiac neg in ER, s/p  PRBC transfusion. however H&H dropped from 9.5 > 7.7. stool guaiac negative,  monitor H&H, hold heparin sc, continue PPI.    h/h stable.        acute kidney injury - possible related to bactrim use , monitor I&O and renal  functions. AKI now resolved    restart lasix.        Hypertension    holding labetalol.  Past Medical History:    Anemia    Anxiety    Asthma    Bipolar 1 disorder    CHF - Congestive heart failure    CKD - chronic kidney disease    COPD - Chronic obstructive pulmonary disease    Depression    GERD    Hypertension    Neuropathy [2]    Procedures and Treatment Provided    xr chest:    IMPRESSION:    Left sided PICC with tip in SVC.    Focal opacity in left midlung could reflect atelectasis or infiltrate.  Multiple    old healed right-sided rib fractures.    [2]            ID s/o note:                    P: Will narrow to PO bactrim for LLE cellulitis, advise another 3 days.    Will restart IV oxacillin and continue clindamycin to augment MSSA spinal  osteomyelitis treatment.    Advise at least another 3 weeks of these.    Weekly CBC w/diff, BUN/Cr, LFTs, and ESR/CRP checked and faxed to me at  [phone] [3]                MRI SPINE:        IMPRESSION:        1\.  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 The ventral epidural phlegmon now present at L1-2 contributing to    moderate spinal stenosis with tiny pockets of rim-enhancing T2 hyperintense    material centrally at the disc level, compatible with tiny abscesses.  Paraspinal    phlegmon with two small psoas abscesses on the left. Interval resolution of    right-sided psoas abscess. Presumed reactive changes in the psoas muscles and    right diaphragmatic crux.        2\.  Otherwise stable multilevel degenerative findings with varying degrees of    spinal stenosis and foraminal narrowing. [4]            XR CHEST:            IMPRESSION:    Left sided PICC with tip in SVC.    Focal opacity in left midlung could reflect atelectasis or infiltrate.  Multiple    old healed right-sided rib fractures.    [5]            CT C SPINE:            IMPRESSION:        Multilevel degenerative changes without evidence of a cervical spine fracture  or    malalignment.        Hendricks Limes MD 10/25/2017 1:12 PM    [6]            CT HEAD:            IMPRESSION:    No evidence of an acute intracranial bleed or skull fracture.        Hendricks Limes MD 10/25/2017 1:08 PM [7]            PELVIS:                    IMPRESSION:    No acute fracture.        Marco Collie MD 10/25/2017  2:24 PM [8]    Physical Exam    Vitals & Measurements    **T: **98.1 F  (Oral) **TMIN: **98.1 F  (Oral) **TMAX: **99.7 F  (Oral)  **HR: **95 (Peripheral Pulse) **RR: **16 **BP: **132/81 **SpO2: **100% **O2  Method (L/min): **Room air            Alert. NAD.    General: disheveled. alert/oriented    Respiratory:  Lungs are clear to auscultation, Breath sounds are equal.    Cardiovascular:  Regular rate and rhythm.    Gastrointestinal:  Soft, Non-tender, Non-distended    Neurologic:  awake    CWC:BJSE edema 1+ [9]    Lab Results    Hgb: 9.1 Gm/dL Low (83/15/17 61:60:73)    Hct: 28.5 % Low (11/08/17 06:00:00)    Discharge Diagnoses        dc dx:        Sepsis  from left leg cellulitis and L-spine osteomyelitis, with MSSA  bacteremia.    Altered Mental status due to toxic metabolic encephalopathy (now resolved)    Acute kidney Injury    hypokalemia              Discharge Medications    _Discharge_    acetaminophen 325 mg oral tablet, 650 mg, PO, q4hr, PRN    albuterol 2.5 mg/3 mL (0.083%) inhalation solution, 2.5 mg= 3 mL, NEB, q4hr,  PRN    amitriptyline, 100 mg, PO, HS    Breo Ellipta 100 mcg-25 mcg/inh inhalation powder, 1 puff(s), INHAL, Daily    Culturelle Digestive Health oral capsule, 1 cap(s), PO, BID    Cymbalta 60 mg oral delayed release capsule, 60 mg= 1 cap(s), PO, Daily    docusate sodium 100 mg oral capsule, 100 mg= 1 cap(s), PO, BID, PRN, 2 refills    folic acid 1 mg oral tablet, 1 mg= 1 tab(s), PO, Daily    Lasix 40 mg oral tablet, 40 mg, PO, Daily, 2 refills    Latuda, 120 mg, PO, HS, evening    LORazepam 0.5 mg oral tablet, 0.5 mg= 1 tab(s), PO, q6hr, PRN    OLANZapine 5 mg oral tablet, disintegrating, 5 mg= 1 tab(s), PO, Daily    oxyCODONE 5 mg oral tablet, 10 mg= 2 tab(s), PO, q6hr, PRN, Partial fill upon  request    Protonix, 40 mg, PO, BID    Spiriva 18 mcg inhalation capsule, 18 mcg= 1 cap(s), INHAL, Daily    Vitamin B12 250 mcg oral tablet, 500 mcg, PO, Daily    Vitamin D3, 1000 Unit(s), PO, Daily    Discharge Instructions    per ID : IV oxacillin and continue clindamycin to augment MSSA spinal  osteomyelitis treatment.    Advise at least another 3 weeks of these.    Weekly CBC w/diff, BUN/Cr, LFTs, and ESR/CRP checked and faxed to me at  [phone] [2]                Discharge Instructions:    Fall precaution instructions provided    Follow up with your Primary Care Phyisician in 1 week    Follow up with cardiology as scheduled    Check CBC and Basic Metabolic Panel in  1 week    Check PT/INR results in 24 hours    Follow up pending Blood cultures, Urine cultures and Biopsy results with your  PCP    Patient/Family updated on medication  reconcillation noted above.  Low Sodium Diet - Ordered    -- 10/25/17 15:01:00 EDT, Room Service, Ensure Clear BID and Mighty Shake  1x/day, Scheduled / PRN    Counseling    Patient and family counselled to return to E.D. if any reoccurance of  symptoms.    Face to Face    Face to Face Encounter: Time spent with patient and care coordination was  approximately 45 minutes. Patient is medIcally stable and able to be discharge  today. Plan of Care was discussed with patient and family. Patient verbalizes  understanding of management and plan of care. No issues regarding discharge  plan.        PLEASE CC THIS DISCHARGE SUMMARY TO THE PATIENT'S PRIMARY CARE PHYSICIAN          [1] Admission H & Dannielle Burn MD, Riad 10/25/2017 15:08 EDT    [2] Progress/SOAP Note; Anmarie Fukushima MD, Fabio Asa 11/07/2017 14:21 EDT    [3] ID Follow UP note; Vickki Muff MD, Adam 10/31/2017 13:15 EDT    [4] MRI Spine Lumbosacral C-/C+; Gery Pray MD, Judie Bonus 10/27/2017 16:03 EDT    [5] XR Chest Single View; Wynelle Link MD, [facility] 10/25/2017 12:21 EDT    [6] CT Spine Cervical C-; Zettie Pho MD, [facility] 10/25/2017 12:33 EDT    [7] CT Head or Brain Yancey Flemings MD, Scott 10/25/2017 12:33 EDT    [8] XR Pelvis 1 or 2 Views; Edyth Gunnels MD, Lillia Abed 10/25/2017 14:04 EDT    [9] Progress/SOAP Note; Hoy Register MD, Fabio Asa 11/07/2017 14:21 EDT    SIGNATURE LINE Electronically signed by Hoy Register MD, Jaheem Hedgepath on 11/08/2017 at  11:36:21 EST

## 2017-10-25 NOTE — Progress Notes (Signed)
Subjective    pt is still paranoid, screaming, not cooperate, cannot be out of [facility] bed due  to safety    no fever    no other events overnight    ROS as noted above, otherwise unremarkable.    History and chart reviewed. lab and images reviewed    Current medications reviewed    pt seen and examined    discussed with nursing staff    Review of Systems    Objective    Vitals & Measurements    **T: **98.0 F  (Oral) **TMIN: **98.0 F  (Oral) **TMAX: **98.6 F  (Oral)  **HR: **92 (Peripheral Pulse) **RR: **18 **BP: **121/74 **SpO2: **98% **O2  Method (L/min): **Room air **WT: **72 Kg    Physical Exam    General:  no acute distress    HEENT: Normocephalic    Respiratory:  Lungs are clear to auscultation, Breath sounds are equal.    Cardiovascular:  Regular rate and rhythm.    Gastrointestinal:  Soft, Non-tender, Non-distended    Extremities: left LE with mild erythema and edema, mild tenderness, improved  from marking area    Neurologic:  awake, paranoid, not cooperative    Medications    _Inpatient_    acetaminophen tablet, 650 mg= 2 tab(s), PO, q4hr, PRN    albuterol 2.5 mg/3 mL (0.083%) inhalation solution, 2.5 mg= 3 mL, NEB, q4hr,  PRN    amitriptyline, 100 mg= 2 tab(s), PO, HS    bacitracin 500 units/g topical ointment, 1 app, TOP, ud, PRN    bisacodyl, 10 mg= 1 supp, PR, Daily, PRN    budesonide 0.5 mg/2 mL inhalation suspension, 0.5 mg= 2 mL, NEB, BID    clindamycin, 600 mg= 50 mL, IV Piggyback, q8hr    Culturelle Digestive Health oral capsule, 1 cap(s), PO, BID    Cymbalta, 60 mg= 2 cap(s), PO, Daily    docusate sodium, 100 mg= 1 cap(s), PO, BID    ferrous sulfate, 325 mg= 1 tab(s), PO, Daily    folic acid, 1 mg= 1 tab(s), PO, Daily    formoterol 20 mcg/2 mL inhalation solution, 20 mcg= 2 mL, NEB, BID    glycopyrrolate 15.6 mcg inhalation capsule, 15.6 mcg= 1 cap(s), INHAL, BID    guaiFENesin, 100 mg= 5 mL, PO, q4hr, PRN    Latuda, 120 mg= 6 tab(s), PO, HS    LORazepam, 0.5 mg= 1 tab(s), PO, q6hr,  PRN    Maalox, 30 mL, PO, q4hr, PRN    Milk of Magnesia 8% oral suspension, 2400 mg= 30 mL, PO, Daily, PRN    nystatin topical, 1 app, TOP, TID    OLANZapine odt, 5 mg= 1 tab(s), PO, Daily    ondansetron, 4 mg= 2 mL, IV Push, q8hr, PRN    oxacillin    oxyCODONE, 10 mg= 2 tab(s), PO, q6hr, PRN    pantoprazole, 40 mg= 1 tab(s), PO, BID    Potassium Chloride IV, 10 mEq= 100 mL, IV Piggyback, q1hr    Sodium Chloride 0.9% Flush, 10 mL, Flush, ud, PRN    Sodium Chloride 0.9% Flush, 10 mL, Flush, q12hr    Sodium Chloride 0.9% Flush, 10 mL, Flush, ud, PRN    Sodium Chloride 0.9% Flush, 10 mL, Flush, q12hr    traMADol, 50 mg= 1 tab(s), PO, q6hr, PRN    Vitamin B12, 500 mcg= 0.5 tab(s), PO, Daily    Vitamin D3, 1000 Unit(s)= 1 tab(s), PO, Daily    ZyPREXA, 5 mg= 2 tab(s),  PO, Daily, PRN    Lab Results    Glucose Lvl: 94 mg/dL (47/82/95 62:13:08)    BUN: 5 mg/dL Low (65/78/46 96:29:52)    Creatinine: 0.716 mg/dL (84/13/24 40:10:27)    Afn Amer Glomerular Filtration Rate: >90 (11/03/17 05:50:00)    Non-Afn Amer Glomerular Filtration Rate: 89 ml/min/1.25m2 (11/03/17 05:50:00)    Sodium Lvl: 145 mmol/L (11/03/17 05:50:00)    Potassium Lvl: 3.1 mmol/L Low (11/03/17 05:50:00)    Chloride: 116 mmol/L High (11/03/17 05:50:00)    CO2: 22 mmol/L (11/03/17 05:50:00)    Anion Gap: 7 (11/03/17 05:50:00)    Calcium Lvl: 7.5 mg/dL Low (25/36/64 40:34:74)    WBC: 4.4 thous/mm3 (11/03/17 05:50:00)    RBC: 2.16 Mil/mm3 Low (11/03/17 05:50:00)    Hgb: 7 Gm/dL Low (25/95/63 87:56:43)    Hct: 22.4 % Low (11/03/17 05:50:00)    Platelet: 329 thous/mm3 (11/03/17 05:50:00)    MCV: 103.7 fL High (11/03/17 05:50:00)    MCH: 32.4 pGm (11/03/17 05:50:00)    MCHC: 31.3 Gm/dL (32/95/18 84:16:60)    RDW-SD: 82.2 fL High (11/03/17 05:50:00)    MPV: 8.7 fL Low (11/03/17 05:50:00)    NRBC Percent: 0 % (11/03/17 05:50:00)    Absolute NRBC Count: 0 thous/mm3 (11/03/17 05:50:00)    Diagnostic Results    Impression and Plan    67 year old female who presents with  confusion and reported fever        SIRS with left leg cellulitis with possible L-spine osteomyelitis    - ID consult appreciated, suggested narrow to PO bactrim for LLE cellulitis,  advise another 3 days. stop today after 3 days of course    restart IV oxacillin and continue clindamycin to augment MSSA spinal  osteomyelitis treatment.    Advise at least another 3 weeks of these.    Weekly CBC w/diff, BUN/Cr, LFTs, and ESR/CRP checked and faxed to Dr. Vickki Muff  at [phone]    c. diff negative, continue probiotics        toxic metabolic encephalopathy \- psych consult appreciated, continue  mentation change likely related with infection.continue current meds.    now with infection under control, still with paranoid, will get psych re-eval    more agitated 4/29, required [facility] bed for safety. continue to monitor, try to  be off S[facility] as possible        anemia - no signs of active bleeding. Stool documented guaiac neg in ER, s/p  PRBC transfusion. however H&H dropped from 9.5 > 7.7. check stool guaic  negative, monitor H&H, hold heparin sc, continue PPI. H&H stable around 7-8        acute kidney injury - possible related to bactrim use , monitor I&O and renal  functions. AKI now resolved        hypo-K - replete        Holding lasix and labetalol in the light of relative low BP        prophylaxis: hold heparin sc with anemia. SCDs        likely rehab when out of S[facility] for 24 hours, reconsult psych    SIGNATURE LINE Electronically signed by Loel Dubonnet MD, Charlene Brooke on 11/03/2017 at  14:37:08 EST

## 2017-10-25 NOTE — Progress Notes (Signed)
Subjective    no fever    Left LE pain controlled. less erythema    no chest pain or sob    no abd pain    eating well    lower back pain with movement    agitated overnight, this AM more calm    History and chart reviewed. lab and images reviewed    Current medications reviewed    pt seen and examined    discussed with nursing staff    ROS as noted above, otherwise unremarkable.      Objective    Vitals & Measurements    **T: **98.7 F  (Oral) **TMIN: **97.7 F  (Oral) **TMAX: **98.7 F  (Oral)  **HR: **100 **RR: **15 **BP: **92/72 **SpO2: **100% **O2 Method (L/min):  **Room air    Physical Exam    General:  no acute distress    HEENT: Normocephalic    Respiratory:  Lungs are clear to auscultation, Breath sounds are equal.    Cardiovascular:  Regular rate and rhythm.    Gastrointestinal:  Soft, Non-tender, Non-distended    Extremities: left LE with mild erythema and edema, mild tenderness, improved  from marking    Neurologic:  awake, alert    Psychiatric:  Cooperative          Medications    _Inpatient_    acetaminophen tablet, 650 mg= 2 tab(s), PO, q4hr, PRN    albuterol 2.5 mg/3 mL (0.083%) inhalation solution, 2.5 mg= 3 mL, NEB, q4hr,  PRN    amitriptyline, 100 mg= 2 tab(s), PO, HS    bacitracin 500 units/g topical ointment, 1 app, TOP, ud, PRN    bisacodyl, 10 mg= 1 supp, PR, Daily, PRN    budesonide 0.5 mg/2 mL inhalation suspension, 0.5 mg= 2 mL, NEB, BID    clindamycin, 600 mg= 50 mL, IV Piggyback, q8hr    Culturelle Digestive Health oral capsule, 1 cap(s), PO, BID    Cymbalta, 60 mg= 2 cap(s), PO, Daily    docusate sodium, 100 mg= 1 cap(s), PO, BID    ferrous sulfate, 325 mg= 1 tab(s), PO, Daily    folic acid, 1 mg= 1 tab(s), PO, Daily    formoterol 20 mcg/2 mL inhalation solution, 20 mcg= 2 mL, NEB, BID    glycopyrrolate 15.6 mcg inhalation capsule, 15.6 mcg= 1 cap(s), INHAL, BID    guaiFENesin, 100 mg= 5 mL, PO, q4hr, PRN    heparin, 5000 Unit(s)= 1 mL, sc, q12hr    Heparin 10 unit(s)/ml Flush, 5  mL, Flush, q12hr    Latuda, 120 mg= 6 tab(s), PO, HS    LORazepam, 0.5 mg= 1 tab(s), PO, q6hr, PRN    Maalox, 30 mL, PO, q4hr, PRN    Milk of Magnesia 8% oral suspension, 2400 mg= 30 mL, PO, Daily, PRN    nystatin topical, 1 app, TOP, TID    OLANZapine odt, 5 mg= 1 tab(s), PO, Daily    ondansetron, 4 mg= 2 mL, IV Push, q8hr, PRN    oxyCODONE, 10 mg= 2 tab(s), PO, q6hr, PRN    pantoprazole, 40 mg= 1 tab(s), PO, BID    Sodium Chloride 0.9% Flush, 10 mL, Flush, ud, PRN    Sodium Chloride 0.9% Flush, 10 mL, Flush, q12hr    traMADol, 50 mg= 1 tab(s), PO, q6hr, PRN    Vitamin B12, 500 mcg= 0.5 tab(s), PO, Daily    Vitamin D3, 1000 Unit(s)= 1 tab(s), PO, Daily    Zosyn    ZyPREXA, 5  mg= 2 tab(s), PO, Daily, PRN    Lab Results    No labs resulted in the past 24 hours.    Diagnostic Results    CT c-spine:    Multilevel degenerative changes without evidence of a cervical spine fracture  or    malalignment. [1]        CT head:    No evidence of an acute intracranial bleed or skull fracture. [2]        CT pelvis:    No acute fracture. [3]        CXR:    Left sided PICC with tip in SVC.    Focal opacity in left midlung could reflect atelectasis or infiltrate.  Multiple    old healed right-sided rib fractures. [4]        MRI L-spine:    1\.  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 The ventral epidural phlegmon now present at L1-2 contributing to    moderate spinal stenosis with tiny pockets of rim-enhancing T2 hyperintense    material centrally at the disc level, compatible with tiny abscesses.  Paraspinal    phlegmon with two small psoas abscesses on the left. Interval resolution of    right-sided psoas abscess. Presumed reactive changes in the psoas muscles and    right diaphragmatic crux.        2\.  Otherwise stable multilevel degenerative findings with varying degrees of    spinal stenosis and foraminal  narrowing. [5]    Impression and Plan    67 year old female who presents with confusion and reported fever        SIRS with left leg cellulitis with possible L-spine osteomyelitis    - ID consult appreciated, suggested continue zosyn and clindamycin.  Anticipate at least another 2-4 weeks of IV antibiotics.        toxic metabolic encephalopathy likely related with infection - psych consult  appreciated, continue current meds        anemia - no signs of active bleeding. Stool documented guaiac neg in ER, s/p  PRBC transfusion. H&H stable after transfusion        acute kidney injury - likely related to bactrim use , monitor I&O and renal  functions. AKI now resolved        Holding lasix and labetalol in the light of low BP        prophylaxis: heparin sc      [1] CT Spine Cervical CZettie Pho MD, Scott 10/25/2017 12:33 EDT    [2] CT Head or Brain Yancey Flemings MD, Scott 10/25/2017 12:33 EDT    [3] XR Pelvis 1 or 2 Views; Edyth Gunnels MD, Lillia Abed 10/25/2017 14:04 EDT    [4] XR Chest Single View; Wynelle Link MD, [facility] 10/25/2017 12:21 EDT    [5] MRI Spine Lumbosacral C-/C+; Gery Pray MD, Judie Bonus 10/27/2017 16:03 EDT    SIGNATURE LINE Electronically signed by Loel Dubonnet MD, Charlene Brooke on 10/29/2017 at  15:37:31 EST

## 2017-10-25 NOTE — Progress Notes (Signed)
Subjective    chart reviewed. pt seen and examined.    still in soma bed    Objective    Vitals & Measurements    **T: **98.7 F  (Oral) **TMIN: **98.7 F  (Oral) **TMAX: **99.0 F  (Oral)  **HR: **106 (Peripheral Pulse) **RR: **17 **BP: **132/70 **SpO2: **99% **O2  Method (L/min): **Room air    Physical Exam    Alert. NAD. In soma bed    Respiratory:  Lungs are clear to auscultation, Breath sounds are equal.    Cardiovascular:  Regular rate and rhythm.    Gastrointestinal:  Soft, Non-tender, Non-distended    Neurologic:  awake, not cooperative    Medications    _Inpatient_    acetaminophen tablet, 650 mg= 2 tab(s), PO, q4hr, PRN    albuterol 2.5 mg/3 mL (0.083%) inhalation solution, 2.5 mg= 3 mL, NEB, q4hr,  PRN    amitriptyline, 100 mg= 2 tab(s), PO, HS    bacitracin 500 units/g topical ointment, 1 app, TOP, ud, PRN    bisacodyl, 10 mg= 1 supp, PR, Daily, PRN    budesonide 0.5 mg/2 mL inhalation suspension, 0.5 mg= 2 mL, NEB, BID    clindamycin, 600 mg= 50 mL, IV Piggyback, q8hr    Culturelle Digestive Health oral capsule, 1 cap(s), PO, BID    Cymbalta, 60 mg= 2 cap(s), PO, Daily    docusate sodium, 100 mg= 1 cap(s), PO, BID    ferrous sulfate, 325 mg= 1 tab(s), PO, Daily    folic acid, 1 mg= 1 tab(s), PO, Daily    formoterol 20 mcg/2 mL inhalation solution, 20 mcg= 2 mL, NEB, BID    glycopyrrolate 15.6 mcg inhalation capsule, 15.6 mcg= 1 cap(s), INHAL, BID    guaiFENesin, 100 mg= 5 mL, PO, q4hr, PRN    Latuda, 120 mg= 6 tab(s), PO, HS    LORazepam, 0.5 mg= 1 tab(s), PO, q6hr, PRN    Maalox, 30 mL, PO, q4hr, PRN    magnesium oxide, 400 mg= 1 tab(s), PO, BID    Milk of Magnesia 8% oral suspension, 2400 mg= 30 mL, PO, Daily, PRN    nystatin topical, 1 app, TOP, TID    OLANZapine odt, 5 mg= 1 tab(s), PO, Daily    ondansetron, 4 mg= 2 mL, IV Push, q8hr, PRN    oxacillin    oxyCODONE, 10 mg= 2 tab(s), PO, q6hr, PRN    pantoprazole, 40 mg= 1 tab(s), PO, BID    Sodium Chloride 0.9% Flush, 10 mL, Flush, ud,  PRN    Sodium Chloride 0.9% Flush, 10 mL, Flush, q12hr    Sodium Chloride 0.9% Flush, 10 mL, Flush, ud, PRN    Sodium Chloride 0.9% Flush, 10 mL, Flush, q12hr    traMADol, 50 mg= 1 tab(s), PO, q6hr, PRN    Vitamin B12, 500 mcg= 0.5 tab(s), PO, Daily    Vitamin D3, 1000 Unit(s)= 1 tab(s), PO, Daily    ZyPREXA, 5 mg= 2 tab(s), PO, Daily, PRN    Lab Results    Glucose Lvl: 89 mg/dL (84/69/62 95:28:41)    BUN: 2 mg/dL Low (32/44/01 02:72:53)    Creatinine: 0.707 mg/dL (66/44/03 47:42:59)    Afn Amer Glomerular Filtration Rate: >90 (11/05/17 05:47:00)    Non-Afn Amer Glomerular Filtration Rate: >90 (11/05/17 05:47:00)    Sodium Lvl: 145 mmol/L (11/05/17 05:47:00)    Potassium Lvl: 3.8 mmol/L (11/05/17 05:47:00)    Chloride: 119 mmol/L High (11/05/17 05:47:00)    CO2: 22 mmol/L (11/05/17 05:47:00)  Anion Gap: 4 (11/05/17 05:47:00)    Calcium Lvl: 7.3 mg/dL Low (40/10/27 25:36:64)    C-Reactive Protein: 1.74 mg/dL High (40/34/74 25:95:63)    WBC: 4.3 thous/mm3 (11/05/17 05:47:00)    RBC: 2.15 Mil/mm3 Low (11/05/17 05:47:00)    Hgb: 7 Gm/dL Low (87/56/43 32:95:18)    Hct: 22.1 % Low (11/05/17 05:47:00)    Platelet: 273 thous/mm3 (11/05/17 05:47:00)    MCV: 102.8 fL High (11/05/17 05:47:00)    MCH: 32.6 pGm (11/05/17 05:47:00)    MCHC: 31.7 Gm/dL (84/16/60 63:01:60)    RDW-SD: 82 fL High (11/05/17 05:47:00)    MPV: 8.3 fL Low (11/05/17 05:47:00)    NRBC Percent: 0 % (11/05/17 05:47:00)    Absolute NRBC Count: 0 thous/mm3 (11/05/17 05:47:00)    Sed Rate: 13 mm/hr (11/05/17 05:47:00)    Impression and Plan    67 year old female who presents with confusion and reported fever        Sepsis from left leg cellulitis with possible L-spine osteomyelitis    PO bactrim for LLE cellulitis (course complete)    on IV oxacillin and continue clindamycin to augment MSSA spinal osteomyelitis  treatment.    at least another 3 weeks of these from 10/31/17    Weekly CBC w/diff, BUN/Cr, LFTs, and ESR/CRP checked and faxed to Dr. Vickki Muff  at  [phone]    c. diff negative, continue probiotics        toxic metabolic encephalopathy \- psych consult appreciated, continue  mentation change likely related with infection.continue current meds.    now with infection under control, still paranoid, psych re-eval appreciated    more agitated 4/29, required SOMA bed for safety. continue to monitor, try to  be off SOMA as possible        anemia - no signs of active bleeding. Stool documented guaiac neg in ER, s/p  PRBC transfusion. however H&H dropped from 9.5 > 7.7. stool guaic negative,  monitor H&H, hold heparin sc, continue PPI.        acute kidney injury - possible related to bactrim use , monitor I&O and renal  functions. AKI now resolved        Holding lasix and labetalol in the light of relative low BP        prophylaxis: hold heparin sc with anemia. SCDs        likely rehab when out of SOMA for 24 hours    SIGNATURE LINE Electronically signed by Melynda Ripple MD, Cimberly Stoffel on 11/05/2017 at  11:51:06 EST

## 2017-10-25 NOTE — Progress Notes (Signed)
Subjective    Today pt noted more paranoid and moody, trying to punch nursing staff. An  additional dose of po Zyprexa given with modest effect.    remains afebrile.    Leg cellulitis much improved.    Objective    Vitals & Measurements    **T: **98.5 F  (Oral) **TMIN: **98.5 F  (Oral) **TMAX: **98.6 F  (Oral)  **HR: **96 (Peripheral Pulse) **RR: **20 **BP: **115/65 **SpO2: **100% **O2  Method (L/min): **Room air    Physical Exam    General:  Alert and oriented, No acute distress.    Eye:  Pupils are equal, round and reactive to light, Normal conjunctiva.    HENT:  Normocephalic.    Neck:  Supple, Non-tender.    Respiratory:  Lungs are clear to auscultation, Respirations are non-labored.    Cardiovascular:  Normal rate, Regular rhythm, No murmur, No gallop.    Gastrointestinal:  Soft, Non-tender, Non-distended, Normal bowel sounds.    Genitourinary:  No costovertebral angle tenderness, Patient declines groin  exam.    Musculoskeletal: Decreasing LLE erythema.    Integumentary:  Warm, Dry, No rash.    Neurologic:  Alert, Oriented, No focal deficits.    Psychiatric:  Cooperative.    Medications    _Inpatient_    acetaminophen tablet, 650 mg= 2 tab(s), PO, q4hr, PRN    albuterol 2.5 mg/3 mL (0.083%) inhalation solution, 2.5 mg= 3 mL, NEB, q4hr,  PRN    amitriptyline, 100 mg= 2 tab(s), PO, HS    bacitracin 500 units/g topical ointment, 1 app, TOP, ud, PRN    bisacodyl, 10 mg= 1 supp, PR, Daily, PRN    budesonide 0.5 mg/2 mL inhalation suspension, 0.5 mg= 2 mL, NEB, BID    clindamycin, 600 mg= 50 mL, IV Piggyback, q8hr    Culturelle Digestive Health oral capsule, 1 cap(s), PO, BID    Cymbalta, 60 mg= 2 cap(s), PO, Daily    docusate sodium, 100 mg= 1 cap(s), PO, BID    ferrous sulfate, 325 mg= 1 tab(s), PO, Daily    folic acid, 1 mg= 1 tab(s), PO, Daily    formoterol 20 mcg/2 mL inhalation solution, 20 mcg= 2 mL, NEB, BID    glycopyrrolate 15.6 mcg inhalation capsule, 15.6 mcg= 1 cap(s), INHAL,  BID    guaiFENesin, 100 mg= 5 mL, PO, q4hr, PRN    heparin, 5000 Unit(s)= 1 mL, sc, q12hr    Heparin 10 unit(s)/ml Flush, 5 mL, Flush, q12hr    Latuda, 120 mg= 6 tab(s), PO, HS    LORazepam, 0.5 mg= 1 tab(s), PO, q6hr, PRN    Maalox, 30 mL, PO, q4hr, PRN    Milk of Magnesia 8% oral suspension, 2400 mg= 30 mL, PO, Daily, PRN    nystatin topical, 1 app, TOP, TID    OLANZapine odt, 5 mg= 1 tab(s), PO, Daily    ondansetron, 4 mg= 2 mL, IV Push, q8hr, PRN    oxyCODONE, 10 mg= 2 tab(s), PO, q6hr, PRN    pantoprazole, 40 mg= 1 tab(s), PO, BID    Sodium Chloride 0.9% Flush, 10 mL, Flush, ud, PRN    Sodium Chloride 0.9% Flush, 10 mL, Flush, q12hr    traMADol, 50 mg= 1 tab(s), PO, q6hr, PRN    Vitamin B12, 500 mcg= 0.5 tab(s), PO, Daily    Vitamin D3, 1000 Unit(s)= 1 tab(s), PO, Daily    Zosyn    Lab Results    Glucose Lvl: 73 mg/dL (32/44/01 02:72:53)  BUN: 20 mg/dL (16/10/96 04:54:09)    Creatinine: 0.801 mg/dL (81/19/14 78:29:56)    Afn Amer Glomerular Filtration Rate: 89 ml/min/1.81m2 (10/28/17 05:46:00)    Non-Afn Amer Glomerular Filtration Rate: 78 ml/min/1.35m2 (10/28/17 05:46:00)    Sodium Lvl: 141 mmol/L (10/28/17 05:46:00)    Potassium Lvl: 3.3 mmol/L Low (10/28/17 05:46:00)    Chloride: 111 mmol/L High (10/28/17 05:46:00)    CO2: 20 mmol/L Low (10/28/17 05:46:00)    Anion Gap: 10 (10/28/17 05:46:00)    Calcium Lvl: 7.7 mg/dL Low (21/30/86 57:84:69)    Vancomycin Lvl: 18.1 mcg/ml (10/28/17 05:46:00)    Vancomycin Trough: 32 mcg/ml Critical (10/27/17 17:21:00)    Lav Top Tube to Hold: DONE (10/27/17 17:21:00)    Blue Top Tube To Hold: DONE (10/27/17 17:21:00)    Diagnostic Results    MRI L spine:            IMPRESSION:        1\.  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 The ventral epidural phlegmon now present at L1-2 contributing to    moderate spinal stenosis with tiny pockets of rim-enhancing T2  hyperintense    material centrally at the disc level, compatible with tiny abscesses.  Paraspinal    phlegmon with two small psoas abscesses on the left. Interval resolution of    right-sided psoas abscess. Presumed reactive changes in the psoas muscles and    right diaphragmatic crux.        2\.  Otherwise stable multilevel degenerative findings with varying degrees of    spinal stenosis and foraminal narrowing. [1]    Impression and Plan    67 year old lady, with the above-mentioned past medical history, presented  from her facility with hypotension and fever.        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        Plan:    Vanco was stopped by ID given no MRSA.    MRI spine results reviewed with ID:    MRI difficult to interpret, some of this may be natural progression while on  treatment but some of it does appear legitimately worse, may need more  aggressive therapy.    Continue zosyn and will follow up gram negative on gram stain.    Will add clindamycin to augment MSSA spinal osteomyelitis treatment.    ID anticipates at least another 2-4 weeks of IV antibiotics.        Anemia and hypokalemia ? sec to recent bactrim use (started by PCP 1 day ago),  responded appropriately to 2 units pRBC and K repleted.    Stool documented guaiac neg in ER and doubt slow GI bleed.    Her COPD does not appear to be in acute exacerbation.    Holding lasix and labetalol in the light of low BP.        Soft wrist restraints ok to be applied if needed. I had asked Psych to  evaluate pt today, given more paranoia.    [1] MRI Spine  Lumbosacral C-/C+; Gery Pray MD, Judie Bonus 10/27/2017 16:03 EDT    SIGNATURE LINE Electronically signed by Felipa Furnace MD (HOSP), Camay Pedigo on  10/28/2017 at 13:52:30  EST

## 2017-10-25 NOTE — Progress Notes (Signed)
Subjective    still in soma bed    Objective    Vitals & Measurements    **T: **98.0 F  (Oral) **TMIN: **98.0 F  (Oral) **TMAX: **98.4 F  (Oral)  **HR: **99 (Peripheral Pulse) **RR: **16 **BP: **153/89 **SpO2: **100% **O2  Method (L/min): **Room air    Physical Exam    Alert. NAD. In soma bed    Respiratory:  Lungs are clear to auscultation, Breath sounds are equal.    Cardiovascular:  Regular rate and rhythm.    Gastrointestinal:  Soft, Non-tender, Non-distended    Neurologic:  awake, not cooperative    Medications    _Inpatient_    acetaminophen tablet, 650 mg= 2 tab(s), PO, q4hr, PRN    albuterol 2.5 mg/3 mL (0.083%) inhalation solution, 2.5 mg= 3 mL, NEB, q4hr,  PRN    amitriptyline, 100 mg= 2 tab(s), PO, HS    bacitracin 500 units/g topical ointment, 1 app, TOP, ud, PRN    bisacodyl, 10 mg= 1 supp, PR, Daily, PRN    budesonide 0.5 mg/2 mL inhalation suspension, 0.5 mg= 2 mL, NEB, BID    clindamycin, 600 mg= 50 mL, IV Piggyback, q8hr    Culturelle Digestive Health oral capsule, 1 cap(s), PO, BID    Cymbalta, 60 mg= 2 cap(s), PO, Daily    docusate sodium, 100 mg= 1 cap(s), PO, BID    ferrous sulfate, 325 mg= 1 tab(s), PO, Daily    folic acid, 1 mg= 1 tab(s), PO, Daily    formoterol 20 mcg/2 mL inhalation solution, 20 mcg= 2 mL, NEB, BID    glycopyrrolate 15.6 mcg inhalation capsule, 15.6 mcg= 1 cap(s), INHAL, BID    guaiFENesin, 100 mg= 5 mL, PO, q4hr, PRN    Latuda, 120 mg= 6 tab(s), PO, HS    LORazepam, 0.5 mg= 1 tab(s), PO, q6hr, PRN    Maalox, 30 mL, PO, q4hr, PRN    Milk of Magnesia 8% oral suspension, 2400 mg= 30 mL, PO, Daily, PRN    nystatin topical, 1 app, TOP, TID    OLANZapine odt, 5 mg= 1 tab(s), PO, Daily    ondansetron, 4 mg= 2 mL, IV Push, q8hr, PRN    oxacillin    oxyCODONE, 10 mg= 2 tab(s), PO, q6hr, PRN    pantoprazole, 40 mg= 1 tab(s), PO, BID    Sodium Chloride 0.9% Flush, 10 mL, Flush, ud, PRN    Sodium Chloride 0.9% Flush, 10 mL, Flush, q12hr    Sodium Chloride 0.9% Flush, 10 mL,  Flush, ud, PRN    Sodium Chloride 0.9% Flush, 10 mL, Flush, q12hr    traMADol, 50 mg= 1 tab(s), PO, q6hr, PRN    Vitamin B12, 500 mcg= 0.5 tab(s), PO, Daily    Vitamin D3, 1000 Unit(s)= 1 tab(s), PO, Daily    ZyPREXA, 5 mg= 2 tab(s), PO, Daily, PRN    Lab Results    Hgb: 7.1 Gm/dL Low (16/10/96 04:54:09)    Hct: 22.7 % Low (11/06/17 05:53:00)    Impression and Plan    67 year old female who presents with confusion and reported fever        Sepsis from left leg cellulitis with possible L-spine osteomyelitis    PO bactrim for LLE cellulitis (course complete)    on IV oxacillin and continue clindamycin to augment MSSA spinal osteomyelitis  treatment.    at least another 3 weeks of these from 10/31/17    Weekly CBC w/diff, BUN/Cr, LFTs, and ESR/CRP checked  and faxed to Dr. Vickki Muff  at [phone]    c. diff negative, continue probiotics        toxic metabolic encephalopathy \- psych consult appreciated, continue  mentation change likely related with infection.continue current meds.    now with infection under control, still paranoid, psych re-eval appreciated    more agitated 4/29, required SOMA bed for safety. continue to monitor, try to  take her off SOMA as possible and safe        anemia - no signs of active bleeding. Stool documented guaiac neg in ER, s/p  PRBC transfusion. however H&H dropped from 9.5 > 7.7. stool guaiac negative,  monitor H&H, hold heparin sc, continue PPI.        acute kidney injury - possible related to bactrim use , monitor I&O and renal  functions. AKI now resolved        Holding lasix and labetalol in the light of relative low BP        prophylaxis: hold heparin sc with anemia. SCDs    SIGNATURE LINE Electronically signed by Melynda Ripple MD, Pattie Flaharty on 11/06/2017 at  10:26:06 EST

## 2017-10-25 NOTE — Progress Notes (Signed)
Subjective    had bowel movement overnight    no abd pain    leg pain improving    no fever    ROS as noted above, otherwise unremarkable.    History and chart reviewed. lab and images reviewed    Current medications reviewed    pt seen and examined    discussed with nursing staff      Objective    Vitals & Measurements    **T: **98.4 F  (Oral) **TMIN: **98.4 F  (Oral) **TMAX: **98.7 F  (Oral)  **HR: **94 **RR: **15 **BP: **127/85 **SpO2: **100% **O2 Method (L/min):  **Room air    Physical Exam    General:  no acute distress    HEENT: Normocephalic    Respiratory:  Lungs are clear to auscultation, Breath sounds are equal.    Cardiovascular:  Regular rate and rhythm.    Gastrointestinal:  Soft, Non-tender, Non-distended    Extremities: left LE with mild erythema and edema, mild tenderness, improved  from marking    Neurologic:  awake    Psychiatric:  Cooperative    Medications    _Inpatient_    acetaminophen tablet, 650 mg= 2 tab(s), PO, q4hr, PRN    albuterol 2.5 mg/3 mL (0.083%) inhalation solution, 2.5 mg= 3 mL, NEB, q4hr,  PRN    amitriptyline, 100 mg= 2 tab(s), PO, HS    bacitracin 500 units/g topical ointment, 1 app, TOP, ud, PRN    bisacodyl, 10 mg= 1 supp, PR, Daily, PRN    budesonide 0.5 mg/2 mL inhalation suspension, 0.5 mg= 2 mL, NEB, BID    clindamycin, 600 mg= 50 mL, IV Piggyback, q8hr    Culturelle Digestive Health oral capsule, 1 cap(s), PO, BID    Cymbalta, 60 mg= 2 cap(s), PO, Daily    docusate sodium, 100 mg= 1 cap(s), PO, BID    ferrous sulfate, 325 mg= 1 tab(s), PO, Daily    folic acid, 1 mg= 1 tab(s), PO, Daily    formoterol 20 mcg/2 mL inhalation solution, 20 mcg= 2 mL, NEB, BID    glycopyrrolate 15.6 mcg inhalation capsule, 15.6 mcg= 1 cap(s), INHAL, BID    guaiFENesin, 100 mg= 5 mL, PO, q4hr, PRN    Heparin 10 unit(s)/ml Flush, 5 mL, Flush, q12hr    Latuda, 120 mg= 6 tab(s), PO, HS    LORazepam, 0.5 mg= 1 tab(s), PO, q6hr, PRN    Maalox, 30 mL, PO, q4hr, PRN    Milk of Magnesia 8%  oral suspension, 2400 mg= 30 mL, PO, Daily, PRN    nystatin topical, 1 app, TOP, TID    OLANZapine odt, 5 mg= 1 tab(s), PO, Daily    ondansetron, 4 mg= 2 mL, IV Push, q8hr, PRN    oxyCODONE, 10 mg= 2 tab(s), PO, q6hr, PRN    pantoprazole, 40 mg= 1 tab(s), PO, BID    Potassium Chloride Oral, 40 mEq= 2 tab(s), PO, q4hr    Sodium Chloride 0.9% Flush, 10 mL, Flush, ud, PRN    Sodium Chloride 0.9% Flush, 10 mL, Flush, q12hr    traMADol, 50 mg= 1 tab(s), PO, q6hr, PRN    Vitamin B12, 500 mcg= 0.5 tab(s), PO, Daily    Vitamin D3, 1000 Unit(s)= 1 tab(s), PO, Daily    Zosyn    ZyPREXA, 5 mg= 2 tab(s), PO, Daily, PRN    Lab Results    Glucose Lvl: 86 mg/dL (16/10/96 04:54:09)    BUN: 12 mg/dL (81/19/14 78:29:56)  Creatinine: 0.669 mg/dL (46/96/29 52:84:13)    Afn Amer Glomerular Filtration Rate: >90 (10/30/17 07:30:00)    Non-Afn Amer Glomerular Filtration Rate: >90 (10/30/17 07:30:00)    Sodium Lvl: 144 mmol/L (10/30/17 07:30:00)    Potassium Lvl: 3 mmol/L Low (10/30/17 07:30:00)    Chloride: 113 mmol/L High (10/30/17 07:30:00)    CO2: 22 mmol/L (10/30/17 07:30:00)    Anion Gap: 9 (10/30/17 07:30:00)    Calcium Lvl: 7.7 mg/dL Low (24/40/10 27:25:36)    WBC: 7.5 thous/mm3 (10/30/17 07:30:00)    RBC: 2.32 Mil/mm3 Low (10/30/17 07:30:00)    Hgb: 7.7 Gm/dL Low (64/40/34 74:25:95)    Hct: 23.5 % Low (10/30/17 07:30:00)    Platelet: 254 thous/mm3 (10/30/17 07:30:00)    MCV: 101.3 fL High (10/30/17 07:30:00)    MCH: 33.2 pGm (10/30/17 07:30:00)    MCHC: 32.8 Gm/dL (63/87/56 43:32:95)    RDW-SD: 79.3 fL High (10/30/17 07:30:00)    MPV: 8.4 fL Low (10/30/17 07:30:00)    NRBC Percent: 0 % (10/30/17 07:30:00)    Absolute NRBC Count: 0 thous/mm3 (10/30/17 07:30:00)    Diagnostic Results    Impression and Plan    67 year old female who presents with confusion and reported fever        SIRS with left leg cellulitis with possible L-spine osteomyelitis    - ID consult appreciated, suggested continue zosyn and clindamycin.  Anticipate at  least another 2-4 weeks of IV antibiotics.        toxic metabolic encephalopathy likely related with infection - psych consult  appreciated, continue current meds        anemia - no signs of active bleeding. Stool documented guaiac neg in ER, s/p  PRBC transfusion. however H&H dropped from 9.5 > 7.7. will check stool guaic,  monitor H&H, hold heparin sc, continue PPI        acute kidney injury - likely related to bactrim use , monitor I&O and renal  functions. AKI now resolved        hypo-K - replete        Holding lasix and labetalol in the light of low BP        prophylaxis: hold heparin sc with anemia. SCDs    SIGNATURE LINE Electronically signed by Loel Dubonnet MD, Charlene Brooke on 10/30/2017 at  14:09:26 EST

## 2017-10-25 NOTE — Progress Notes (Signed)
Subjective    patient had been taken out of soma bed last night.        Patient seen and examined at bedside.    Labs , imaging, and notes reviewed.    Discussed case with RN    8 systems examined, pertinent positives above, all rest negative.    Review of Systems    Objective    Vitals & Measurements    **T: **99.7 F  (Oral) **TMIN: **98.0 F  (Oral) **TMAX: **99.7 F  (Oral)  **HR: **99 (Peripheral Pulse) **RR: **20 **BP: **135/86 **SpO2: **98% **O2  Method (L/min): **Room air    Physical Exam    Alert. NAD.    General: disheveled. alert/oriented    Respiratory:  Lungs are clear to auscultation, Breath sounds are equal.    Cardiovascular:  Regular rate and rhythm.    Gastrointestinal:  Soft, Non-tender, Non-distended    Neurologic:  awake    ZOX:WRUE edema 1+    Medications    _Inpatient_    acetaminophen tablet, 650 mg= 2 tab(s), PO, q4hr, PRN    albuterol 2.5 mg/3 mL (0.083%) inhalation solution, 2.5 mg= 3 mL, NEB, q4hr,  PRN    amitriptyline, 100 mg= 2 tab(s), PO, HS    bacitracin 500 units/g topical ointment, 1 app, TOP, ud, PRN    bisacodyl, 10 mg= 1 supp, PR, Daily, PRN    budesonide 0.5 mg/2 mL inhalation suspension, 0.5 mg= 2 mL, NEB, BID    clindamycin, 600 mg= 50 mL, IV Piggyback, q8hr    Culturelle Digestive Health oral capsule, 1 cap(s), PO, BID    Cymbalta, 60 mg= 2 cap(s), PO, Daily    docusate sodium, 100 mg= 1 cap(s), PO, BID    ferrous sulfate, 325 mg= 1 tab(s), PO, Daily    folic acid, 1 mg= 1 tab(s), PO, Daily    formoterol 20 mcg/2 mL inhalation solution, 20 mcg= 2 mL, NEB, BID    glycopyrrolate 15.6 mcg inhalation capsule, 15.6 mcg= 1 cap(s), INHAL, BID    guaiFENesin, 100 mg= 5 mL, PO, q4hr, PRN    Latuda, 120 mg= 6 tab(s), PO, HS    LORazepam, 0.5 mg= 1 tab(s), PO, q6hr, PRN    Maalox, 30 mL, PO, q4hr, PRN    Milk of Magnesia 8% oral suspension, 2400 mg= 30 mL, PO, Daily, PRN    nystatin topical, 1 app, TOP, TID    OLANZapine odt, 5 mg= 1 tab(s), PO, Daily    ondansetron, 4 mg= 2 mL,  IV Push, q8hr, PRN    oxacillin    oxyCODONE, 10 mg= 2 tab(s), PO, q6hr, PRN    pantoprazole, 40 mg= 1 tab(s), PO, BID    Sodium Chloride 0.9% Flush, 10 mL, Flush, ud, PRN    Sodium Chloride 0.9% Flush, 10 mL, Flush, q12hr    Sodium Chloride 0.9% Flush, 10 mL, Flush, ud, PRN    Sodium Chloride 0.9% Flush, 10 mL, Flush, q12hr    traMADol, 50 mg= 1 tab(s), PO, q6hr, PRN    Vitamin B12, 500 mcg= 0.5 tab(s), PO, Daily    Vitamin D3, 1000 Unit(s)= 1 tab(s), PO, Daily    ZyPREXA, 5 mg= 2 tab(s), PO, Daily, PRN    Lab Results    Hgb: 7.1 Gm/dL Low (45/40/98 11:91:47)    Hct: 22.1 % Low (11/07/17 06:12:00)    Diagnostic Results    Impression and Plan    67 year old female who presents with confusion and reported fever  Sepsis from left leg cellulitis with possible L-spine osteomyelitis    PO bactrim for LLE cellulitis (course complete)    on IV oxacillin and continue clindamycin to augment MSSA spinal osteomyelitis  treatment.    at least another 3 weeks of these from 10/31/17    Weekly CBC w/diff, BUN/Cr, LFTs, and ESR/CRP checked and faxed to Dr. Vickki Muff  at [phone]    c. diff negative, continue probiotics        toxic metabolic encephalopathy \- psych consult appreciated, continue  mentation change likely related with infection.continue current meds.    out of soma bed    likely dcin the AM .    cont odt zyprexa.        anemia - no signs of active bleeding. Stool documented guaiac neg in ER, s/p  PRBC transfusion. however H&H dropped from 9.5 > 7.7. stool guaiac negative,  monitor H&H, hold heparin sc, continue PPI.    h/h stable.        acute kidney injury - possible related to bactrim use , monitor I&O and renal  functions. AKI now resolved    restart lasix.        Hypertension    holding labetalol.        prophylaxis: hold heparin sc with anemia. SCDs            Low Sodium Diet - Ordered    -- 10/25/17 15:01:00 EDT, Room Service, Ensure Clear BID and Mighty Shake  1x/day, Scheduled / PRN            Code  Status - Ordered    -- 10/25/17 15:03:00 EDT, Full Resuscitation, Constant Order        Past Medical History:    Anemia    Anxiety    Asthma    Bipolar 1 disorder    CHF - Congestive heart failure    CKD - chronic kidney disease    COPD - Chronic obstructive pulmonary disease    Depression    GERD    Hypertension    Neuropathy          [1] Progress/SOAP Note; Melynda Ripple MD, Abhijeet 11/06/2017 10:24 EDT    SIGNATURE LINE Electronically signed by Hoy Register MD, Neriyah Cercone on 11/07/2017 at  14:47:28 EST

## 2017-10-25 NOTE — Progress Notes (Signed)
Subjective    intermittent agitation. currently calm and able to answer some questions    fever 99.9 F max    felt leg pain improving    back pain with movement    History and chart reviewed. lab and images reviewed    Current medications reviewed    pt seen and examined    discussed with nursing staff      Objective    Vitals & Measurements    **T: **98.9 F  (Oral) **TMIN: **97.6 F  (Axillary) **TMAX: **99.9 F  (Oral)  **HR: **98 **RR: **11 **BP: **127/90 **SpO2: **99% **O2 Method (L/min): **Room  air    Physical Exam    General:  no acute distress    HEENT: Normocephalic    Respiratory:  Lungs are clear to auscultation, Breath sounds are equal.    Cardiovascular:  Regular rate and rhythm.    Gastrointestinal:  Soft, Non-tender, Non-distended    Extremities: left LE with mild erythema and edema, mild tenderness, improved  from marking    Neurologic:  awake      Medications    _Inpatient_    acetaminophen tablet, 650 mg= 2 tab(s), PO, q4hr, PRN    albuterol 2.5 mg/3 mL (0.083%) inhalation solution, 2.5 mg= 3 mL, NEB, q4hr,  PRN    amitriptyline, 100 mg= 2 tab(s), PO, HS    bacitracin 500 units/g topical ointment, 1 app, TOP, ud, PRN    bisacodyl, 10 mg= 1 supp, PR, Daily, PRN    budesonide 0.5 mg/2 mL inhalation suspension, 0.5 mg= 2 mL, NEB, BID    clindamycin, 600 mg= 50 mL, IV Piggyback, q8hr    Culturelle Digestive Health oral capsule, 1 cap(s), PO, BID    Cymbalta, 60 mg= 2 cap(s), PO, Daily    docusate sodium, 100 mg= 1 cap(s), PO, BID    ferrous sulfate, 325 mg= 1 tab(s), PO, Daily    folic acid, 1 mg= 1 tab(s), PO, Daily    formoterol 20 mcg/2 mL inhalation solution, 20 mcg= 2 mL, NEB, BID    glycopyrrolate 15.6 mcg inhalation capsule, 15.6 mcg= 1 cap(s), INHAL, BID    guaiFENesin, 100 mg= 5 mL, PO, q4hr, PRN    Heparin 10 unit(s)/ml Flush, 5 mL, Flush, q12hr    Latuda, 120 mg= 6 tab(s), PO, HS    LORazepam, 0.5 mg= 1 tab(s), PO, q6hr, PRN    Maalox, 30 mL, PO, q4hr, PRN    Milk of Magnesia 8% oral  suspension, 2400 mg= 30 mL, PO, Daily, PRN    nystatin topical, 1 app, TOP, TID    OLANZapine odt, 5 mg= 1 tab(s), PO, Daily    ondansetron, 4 mg= 2 mL, IV Push, q8hr, PRN    oxyCODONE, 10 mg= 2 tab(s), PO, q6hr, PRN    pantoprazole, 40 mg= 1 tab(s), PO, BID    Potassium Chloride Oral, 40 mEq= 2 tab(s), PO, q4hr    Sodium Chloride 0.9% Flush, 10 mL, Flush, ud, PRN    Sodium Chloride 0.9% Flush, 10 mL, Flush, q12hr    traMADol, 50 mg= 1 tab(s), PO, q6hr, PRN    Vitamin B12, 500 mcg= 0.5 tab(s), PO, Daily    Vitamin D3, 1000 Unit(s)= 1 tab(s), PO, Daily    Zosyn    ZyPREXA, 5 mg= 2 tab(s), PO, Daily, PRN    Lab Results    Occult Blood Stool I POC: Negative (10/31/17 05:11:00)    Quality Control Positive Stool POC: Yes (10/31/17 05:11:00)  Glucose Lvl: 86 mg/dL (66/44/03 47:42:59)    BUN: 10 mg/dL (56/38/75 64:33:29)    Creatinine: 0.63 mg/dL (51/88/41 66:06:30)    Afn Amer Glomerular Filtration Rate: >90 (10/31/17 07:24:00)    Non-Afn Amer Glomerular Filtration Rate: >90 (10/31/17 07:24:00)    Sodium Lvl: 142 mmol/L (10/31/17 07:24:00)    Potassium Lvl: 3.2 mmol/L Low (10/31/17 07:24:00)    Chloride: 112 mmol/L High (10/31/17 07:24:00)    CO2: 22 mmol/L (10/31/17 07:24:00)    Anion Gap: 8 (10/31/17 07:24:00)    Calcium Lvl: 7.8 mg/dL Low (16/01/09 32:35:57)    WBC: 7.2 thous/mm3 (10/31/17 07:24:00)    RBC: 2.42 Mil/mm3 Low (10/31/17 07:24:00)    Hgb: 8 Gm/dL Low (32/20/25 42:70:62)    Hct: 24.4 % Low (10/31/17 07:24:00)    Platelet: 273 thous/mm3 (10/31/17 07:24:00)    MCV: 100.8 fL High (10/31/17 07:24:00)    MCH: 33.1 pGm (10/31/17 07:24:00)    MCHC: 32.8 Gm/dL (37/62/83 15:17:61)    RDW-SD: 77 fL High (10/31/17 07:24:00)    MPV: 8.5 fL Low (10/31/17 07:24:00)    NRBC Percent: 0 % (10/31/17 07:24:00)    Absolute NRBC Count: 0 thous/mm3 (10/31/17 07:24:00)    Impression and Plan    67 year old female who presents with confusion and reported fever        SIRS with left leg cellulitis with 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  Anticipate at least another 2-4 weeks of IV antibiotics.        toxic metabolic encephalopathy likely related with infection - psych consult  appreciated, continue current meds        anemia - no signs of active bleeding. Stool documented guaiac neg in ER, s/p  PRBC transfusion. however H&H dropped from 9.5 > 7.7 yesterday. check stool  guaic negative, monitor H&H, hold heparin sc, continue PPI. H&H stable today        acute kidney injury - likely related to bactrim use , monitor I&O and renal  functions. AKI now resolved        hypo-K - replete        Holding lasix and labetalol in the light of low BP        prophylaxis: hold heparin sc with anemia. SCDs    SIGNATURE LINE Electronically signed by Loel Dubonnet MD, Charlene Brooke on 10/31/2017 at  15:06:05 EST

## 2017-10-25 NOTE — Progress Notes (Signed)
Subjective    paranoid and agitation, required SOMA bed    still in SOMA bed this AM, screaming    no fever        ROS as noted above, otherwise unremarkable.    History and chart reviewed. lab and images reviewed    Current medications reviewed    pt seen and examined    discussed with nursing staff    Objective    Vitals & Measurements    **T: **98.0 F  (Oral) **TMIN: **98.0 F  (Oral) **TMAX: **99.4 F  (Oral)  **HR: **90 (Peripheral Pulse) **RR: **18 **BP: **132/46 **SpO2: **94% **O2  Method (L/min): **Room air    Physical Exam    General:  no acute distress    HEENT: Normocephalic    Respiratory:  Lungs are clear to auscultation, Breath sounds are equal.    Cardiovascular:  Regular rate and rhythm.    Gastrointestinal:  Soft, Non-tender, Non-distended    Extremities: left LE with mild erythema and edema, mild tenderness, improved  from marking area    Neurologic:  awake, paranoid    Medications    _Inpatient_    acetaminophen tablet, 650 mg= 2 tab(s), PO, q4hr, PRN    albuterol 2.5 mg/3 mL (0.083%) inhalation solution, 2.5 mg= 3 mL, NEB, q4hr,  PRN    amitriptyline, 100 mg= 2 tab(s), PO, HS    bacitracin 500 units/g topical ointment, 1 app, TOP, ud, PRN    Bactrim DS 800 mg-160 mg oral tablet, 1 tab(s), PO, BID    bisacodyl, 10 mg= 1 supp, PR, Daily, PRN    budesonide 0.5 mg/2 mL inhalation suspension, 0.5 mg= 2 mL, NEB, BID    clindamycin, 600 mg= 50 mL, IV Piggyback, q8hr    Culturelle Digestive Health oral capsule, 1 cap(s), PO, BID    Cymbalta, 60 mg= 2 cap(s), PO, Daily    docusate sodium, 100 mg= 1 cap(s), PO, BID    ferrous sulfate, 325 mg= 1 tab(s), PO, Daily    folic acid, 1 mg= 1 tab(s), PO, Daily    formoterol 20 mcg/2 mL inhalation solution, 20 mcg= 2 mL, NEB, BID    glycopyrrolate 15.6 mcg inhalation capsule, 15.6 mcg= 1 cap(s), INHAL, BID    guaiFENesin, 100 mg= 5 mL, PO, q4hr, PRN    Latuda, 120 mg= 6 tab(s), PO, HS    LORazepam, 0.5 mg= 1 tab(s), PO, q6hr, PRN    Maalox, 30 mL, PO, q4hr,  PRN    Milk of Magnesia 8% oral suspension, 2400 mg= 30 mL, PO, Daily, PRN    nystatin topical, 1 app, TOP, TID    OLANZapine odt, 5 mg= 1 tab(s), PO, Daily    ondansetron, 4 mg= 2 mL, IV Push, q8hr, PRN    oxacillin    oxyCODONE, 10 mg= 2 tab(s), PO, q6hr, PRN    pantoprazole, 40 mg= 1 tab(s), PO, BID    Sodium Chloride 0.9% Flush, 10 mL, Flush, ud, PRN    Sodium Chloride 0.9% Flush, 10 mL, Flush, q12hr    Sodium Chloride 0.9% Flush, 10 mL, Flush, ud, PRN    Sodium Chloride 0.9% Flush, 10 mL, Flush, q12hr    traMADol, 50 mg= 1 tab(s), PO, q6hr, PRN    Vitamin B12, 500 mcg= 0.5 tab(s), PO, Daily    Vitamin D3, 1000 Unit(s)= 1 tab(s), PO, Daily    ZyPREXA, 5 mg= 2 tab(s), PO, Daily, PRN    Lab Results    Glucose Lvl: 57  mg/dL Low (44/01/02 72:53:66)    BUN: 5 mg/dL Low (44/03/47 42:59:56)    Creatinine: 0.53 mg/dL Low (38/75/64 33:29:51)    Afn Amer Glomerular Filtration Rate: >90 (11/02/17 05:56:00)    Non-Afn Amer Glomerular Filtration Rate: >90 (11/02/17 05:56:00)    Sodium Lvl: 146 mmol/L (11/02/17 05:56:00)    Potassium Lvl: 3.4 mmol/L Low (11/02/17 05:56:00)    Chloride: 116 mmol/L High (11/02/17 05:56:00)    CO2: 20 mmol/L Low (11/02/17 05:56:00)    Anion Gap: 10 (11/02/17 05:56:00)    Calcium Lvl: 7.7 mg/dL Low (88/41/66 06:30:16)    WBC: 4.5 thous/mm3 (11/02/17 05:56:00)    RBC: 2.21 Mil/mm3 Low (11/02/17 05:56:00)    Hgb: 7.2 Gm/dL Low (07/13/30 35:57:32)    Hct: 22.9 % Low (11/02/17 05:56:00)    Platelet: 326 thous/mm3 (11/02/17 05:56:00)    MCV: 103.6 fL High (11/02/17 05:56:00)    MCH: 32.6 pGm (11/02/17 05:56:00)    MCHC: 31.4 Gm/dL (20/25/42 70:62:37)    RDW-SD: 81.6 fL High (11/02/17 05:56:00)    MPV: 8.7 fL Low (11/02/17 05:56:00)    NRBC Percent: 0 % (11/02/17 05:56:00)    Absolute NRBC Count: 0 thous/mm3 (11/02/17 05:56:00)    Diagnostic Results    Impression and Plan    67 year old female who presents with confusion and reported fever        SIRS with left leg cellulitis with possible L-spine  osteomyelitis    - ID consult appreciated, suggested narrow to PO bactrim for LLE cellulitis,  advise another 3 days. will stop tomorrow 5/2    restart IV oxacillin and continue clindamycin to augment MSSA spinal  osteomyelitis treatment.    Advise at least another 3 weeks of these.    Weekly CBC w/diff, BUN/Cr, LFTs, and ESR/CRP checked and faxed to Dr. Vickki Muff  at [phone]    c. diff negative, continue probiotics        toxic metabolic encephalopathy likely related with infection - psych consult  appreciated, continue current meds.    more agitated 4/29, required SOMA bed for safety. continue to monitor, try to  be off SOMA as possible        anemia - no signs of active bleeding. Stool documented guaiac neg in ER, s/p  PRBC transfusion. however H&H dropped from 9.5 > 7.7. check stool guaic  negative, monitor H&H, hold heparin sc, continue PPI. H&H stable since then        acute kidney injury - possible related to bactrim use , monitor I&O and renal  functions. AKI now resolved        hypo-K - replete        Holding lasix and labetalol in the light of low BP        prophylaxis: hold heparin sc with anemia. SCDs        likely rehab when out of SOMA for 24 hours    SIGNATURE LINE Electronically signed by Loel Dubonnet MD, Charlene Brooke on 11/02/2017 at  13:14:05 EST

## 2017-10-25 NOTE — H&P (Signed)
Chief Complaint    Hypotension, fever.    History of Present Illness    This is a 67 year old lady, with the above-mentioned past medical history,  presented from her facility with hypotension and fever. Of note the patient  was discharged on April 2 after she was hospitalized for unwitnessed fall at  rehabilitation, altered mental status secondary to toxic metabolic  encephalopathy, recent MSSA bacteremia and osteomyelitis on IV oxacillin by  PICC line. The patient is a poor historian,She has no complaints at this  point, denies any respiratory, GI, or irritative urinary symptoms, the history  was taken from emergency department team and transfer paperwork, at her  facility there was a concern of worsening left leg skin cellulitis and  discharges from her chronic wounds, she was started on Bactrim yesterday, she  was noted to have low blood pressure today with fever, she presented to the  ER. In the ER initial blood pressure was 86/58, she received IV fluid  hydration, current blood pressure is 95/62 with map of 73, blood work shows  creatinine of 1.5, sodium 137, potassium 2.6, albumin of 1.2, lactic acid of  2.7, no leukocytosis, hematocrit of 19 from 22 earlier this month, MCV 110,  urine is pending, chest x-ray shows focal opacity in the left midlung could be  atelectasis versus pneumonia, she received a dose of vancomycin and Zosyn, she  is being transfused with 2 units of packed RBCs, she is guaiac-negative, she's  been admitted for further management.      Review of Systems    12 systems were reviewed and were negative except in history of present  illness.      Code Status    Code Status - Ordered    -- 10/25/17 15:03:00 EDT, Full Resuscitation, Constant Order    Physical Exam    Vitals & Measurements    **T: **98.4 F  (Oral) **HR: **84 **RR: **12 **BP: **92/48 **SpO2: **100% **O2  Method (L/min): **Room air **WT: **72 Kg    Alert oriented -3, not in acute distress    Normocephalic, atraumatic,  nonicteric    Supple    Clear to auscultation bilateral    Normal S1-S2 no murmur no gallop normal    Soft nontender nondistended no organomegaly    No focal deficits    Left lower extremity cellulitis with open wounds, purulent discharge.    Cooperative      Impression and Plan    #1 severe sepsis: Likely secondary to lower extremity cellulitis with question  of left midlung pneumonia (Unlikely the patient has no respiratory symptoms),  obtain MRSA swab, sputum culture, blood culture, urine culture, wound culture,  IV vancomycin and Zosyn, infectious disease evaluation. IV fluid hydration,  repeat lactic acid in 4 hours.Hold off antihypertensive medications including  Lasix, reevaluate tomorrow.            #2 acute hypokalemia: Aggressively repleted.            #3 acute on chronic anemia: The patient has history of anemia of chronic  disease, she is guaiac-negative, she is being transfused 2 units of packed  RBCs, abdomen is soft nontender, watch H&H closely.            #4 the patient is a full code.            #5 anticipated hospital stay is more than 2 nights.      CMS Two-Midnight Rule    I certify that hospital inpatient  services are reasonable and medically  necessary. They are appropriately provided as inpatient services in accordance  with the two midnight benchmark under 42CFR 412 3(e), or the services are  specified as inpatient only procedure under 42 CFR 419 22(n).          Problem List/Past Medical History    - Bipolar disorder    - Anxiety/depression    - Chronic renal insufficiency    - Hypertension    - History of an esophageal ulcer    - History of choledocholithiasis    - Anemia    - COPD    -CHF    Procedure/Surgical History    Transfusion of Nonautologous Red Blood Cells into Peripheral Vein,  Percutaneous Approach (09/13/2017), Insertion of Endotracheal Airway into  Trachea, Via Natural or Artificial Opening (09/04/2017), Respiratory  Ventilation, 24-96 Consecutive Hours (09/04/2017),  Inspection of Spinal Cord,  Percutaneous Approach (09/03/2017), Transfusion of Nonautologous Red Blood  Cells into Peripheral Vein, Percutaneous Approach (08/20/2017), EGD with  Anesthesia (N/A) (03/19/2017), Inspection of Upper Intestinal Tract, Via  Natural or Artificial Opening Endoscopic (03/19/2017), Transfusion of  Nonautologous Red Blood Cells into Peripheral Vein, Percutaneous Approach  (03/16/2017), EGD with Anesthesia (N/A) (12/23/2016), Inspection of Upper  Intestinal Tract, Via Natural or Artificial Opening Endoscopic (12/23/2016),  Transfusion of Nonautologous Red Blood Cells into Peripheral Vein,  Percutaneous Approach (12/20/2016), Drainage of Common Bile Duct with Drainage  Device, Percutaneous Approach (08/06/2016), Removal of Drainage Device from  Hepatobiliary Duct, External Approach (08/06/2016), Excision of Ampulla of  Vater, Percutaneous Approach, Diagnostic (08/05/2016), Drainage of Common Bile  Duct with Drainage Device, Percutaneous Approach (07/29/2016), Fluoroscopy of  Biliary and Pancreatic Ducts using Other Contrast (07/29/2016), TRAN NAUTO RED  BLD CLL PERIPH VN PC (10/04/2014), Gastric bypass surgery (2012).    Social History    _Alcohol_    Current, 1-2 times per week    Past    _Substance Abuse_    Never    _Tobacco_    Former smoker, Cigarettes    Family History    High blood pressure: Father, Sister and Brother.    Allergies    NKA    Medications    _Inpatient_    acetaminophen tablet, 650 mg= 2 tab(s), PO, Once    acetaminophen tablet, 650 mg= 2 tab(s), PO, q4hr, PRN    albuterol 2.5 mg/3 mL (0.083%) inhalation solution, 2.5 mg= 3 mL, NEB, q4hr,  PRN    amitriptyline, 100 mg, PO, HS    bacitracin 500 units/g topical ointment, 1 app, TOP, ud, PRN    bisacodyl, 10 mg= 1 supp, PR, Daily, PRN    bolus NS 500 mL, 500 mL, IV    budesonide 0.5 mg/2 mL inhalation suspension, 0.5 mg= 2 mL, NEB, BID    Cymbalta, 60 mg, PO, Daily    docusate sodium, 100 mg= 1 cap(s), PO, BID    folic acid, 1  mg= 1 tab(s), PO, Daily    formoterol 20 mcg/2 mL inhalation solution, 20 mcg= 2 mL, NEB, BID    glycopyrrolate 15.6 mcg inhalation capsule, 15.6 mcg= 1 cap(s), INHAL, BID    guaiFENesin, 100 mg= 5 mL, PO, q4hr, PRN    Heparin 10 unit(s)/ml Flush, 5 mL, Flush, q12hr    Latuda, 120 mg, PO, HS    Maalox, 30 mL, PO, q4hr, PRN    Milk of Magnesia 8% oral suspension, 2400 mg= 30 mL, PO, Daily, PRN    NaCl 0.9% bolus 1,000 mL, 1000 mL, IV  ondansetron, 4 mg= 2 mL, IV Push, q8hr, PRN    pantoprazole, 40 mg= 1 tab(s), PO, BID    Potassium Chloride Oral, 40 mEq= 30 mL, PO, Once    Sodium Chloride 0.9% 1000 mL + potassium chloride iv additive 40 mEq    Sodium Chloride 0.9% 250 mL, 250 mL, IV    Sodium Chloride 0.9% Flush, 10 mL, Flush, ud, PRN    Sodium Chloride 0.9% Flush, 10 mL, Flush, q12hr    Sodium Chloride 0.9% with KCl 40 mEq/L 1,000 mL, 1000 mL, IV    vancomycin    Vitamin B12, 500 mcg, PO, Daily    Vitamin D3, 1000 Unit(s), PO, Daily    Zosyn    _Home_    acetaminophen 325 mg oral tablet, 650 mg, PO, q4hr, PRN    albuterol 2.5 mg/3 mL (0.083%) inhalation solution, 2.5 mg= 3 mL, NEB, q4hr,  PRN    amitriptyline, 100 mg, PO, HS    Breo Ellipta 100 mcg-25 mcg/inh inhalation powder, 1 puff(s), INHAL, Daily    Culturelle Digestive Health oral capsule, 1 cap(s), PO, BID    Cymbalta 60 mg oral delayed release capsule, 60 mg= 1 cap(s), PO, Daily    docusate sodium 100 mg oral capsule, 100 mg= 1 cap(s), PO, BID, PRN, 2 refills    ferrous gluconate 240 mg (27 mg elemental iron) oral tablet, 240 mg, PO, BID    folic acid 1 mg oral tablet, 1 mg= 1 tab(s), PO, Daily    labetalol 100 mg oral tablet, 50 mg= 0.5 tab(s), PO, BID    Lasix 40 mg oral tablet, 40 mg, PO, Daily, 2 refills    Latuda, 120 mg, PO, HS, evening    OLANZapine 5 mg oral tablet, disintegrating, 5 mg= 1 tab(s), PO, Daily    Potassium Chloride Oral, 20 mEq, PO, BID    Protonix, 40 mg, PO, BID    senna 8.6 mg oral tablet, 8.6 mg= 1 tab(s), PO, BID    Spiriva 18  mcg inhalation capsule, 18 mcg= 1 cap(s), INHAL, Daily    Vitamin B12 250 mcg oral tablet, 500 mcg, PO, Daily    Vitamin D3, 1000 Unit(s), PO, Daily    Diet    Low Sodium Diet - Ordered    -- 10/25/17 15:01:00 EDT, Room Service, Scheduled / PRN    Lab Results    Glucose Lvl: 75 mg/dL (30/86/57 84:69:62)    BUN: 34 mg/dL High (95/28/41 32:44:01)    Creatinine: 1.57 mg/dL High (02/72/53 66:44:03)    Afn Amer Glomerular Filtration Rate: 40 ml/min/1.17m2 (10/25/17 13:28:00)    Non-Afn Amer Glomerular Filtration Rate: 34 ml/min/1.7m2 (10/25/17 13:28:00)    Sodium Lvl: 137 mmol/L (10/25/17 13:28:00)    Potassium Lvl: 2.6 mmol/L Critical (10/25/17 13:28:00)    Chloride: 99 mmol/L (10/25/17 13:28:00)    CO2: 22 mmol/L (10/25/17 13:28:00)    Anion Gap: 16 High (10/25/17 13:28:00)    Total Protein: 5.7 Gm/dL Low (47/42/59 56:38:75)    Albumin Lvl: 1.2 Gm/dL Low (64/33/29 51:88:41)    Calcium Lvl: 8 mg/dL Low (66/06/30 16:01:09)    Phosphorus: 3.9 mg/dL (32/35/57 32:20:25)    Magnesium Lvl: 1.8 mg/dL (42/70/62 37:62:83)    Bilirubin Total: 0.4 mg/dL (15/17/61 60:73:71)    Alkaline Phosphatase: 170 Units/L High (10/25/17 13:28:00)    AST: 12 Units/L (10/25/17 13:28:00)    ALT: 7 Units/L (10/25/17 13:28:00)    Lipase Lvl: 15 Units/L Low (10/25/17 13:28:00)    Lactic Acid Lvl: 2.7  mmol/L High (10/25/17 13:28:00)    Troponin I: <0.015 (10/25/17 13:28:00)    WBC: 9.3 thous/mm3 (10/25/17 13:28:00)    RBC: 1.79 Mil/mm3 Low (10/25/17 13:28:00)    Hgb: 6.1 Gm/dL Critical (13/08/65 78:46:96)    Hct: 19.8 % Critical (10/25/17 13:28:00)    Platelet: 297 thous/mm3 (10/25/17 13:28:00)    MCV: 110.6 fL High (10/25/17 13:28:00)    MCH: 34.1 pGm High (10/25/17 13:28:00)    MCHC: 30.8 Gm/dL Low (29/52/84 13:24:40)    RDW-SD: 94.1 fL High (10/25/17 13:28:00)    MPV: 8.8 fL Low (10/25/17 13:28:00)    Absolute Neutro Count: 7.19 thous/mm3 (10/25/17 13:28:00)    Absolute Lymphs Count: 1.42 thous/mm3 (10/25/17 13:28:00)    Absolute Mono Count: 0.59  thous/mm3 (10/25/17 13:28:00)    Absolute Eos Count: 0.03 thous/mm3 (10/25/17 13:28:00)    Absolute Baso Count: 0.01 thous/mm3 (10/25/17 13:28:00)    Neutrophils: 77.5 % (10/25/17 13:28:00)    Lymphocytes: 15.3 % (10/25/17 13:28:00)    Monocytes: 6.4 % (10/25/17 13:28:00)    Eosinophils: 0.3 % (10/25/17 13:28:00)    Basophils: 0.1 % (10/25/17 13:28:00)    Immature Granulocytes: 0.4 % (10/25/17 13:28:00)    NRBC Percent: 0 % (10/25/17 13:28:00)    Absolute NRBC Count: 0 thous/mm3 (10/25/17 13:28:00)    Hypochromia: 1+ Abnormal (10/25/17 13:28:00)    Polychrom: 1+ Abnormal (10/25/17 13:28:00)    Anisocytosis: 1+ Abnormal (10/25/17 13:28:00)    Macrocytes: 1+ Abnormal (10/25/17 13:28:00)    Platelet Morph: Enlarged Abnormal (10/25/17 13:28:00)    ABO/Rh Automated: A POS (10/25/17 13:28:00)    Antibody Screen Automated: Negative (10/25/17 13:28:00)    Electronic Crossmatch: Computer XM OK (10/25/17 13:28:00)    Electronic Crossmatch: Computer XM OK (10/25/17 13:28:00)    PT: 11.9 sec High (10/25/17 13:28:00)    INR: 1.2 (10/25/17 13:28:00)    aPTT: 23 sec (10/25/17 13:28:00)    Blue Top Tube To Hold: DONE (10/25/17 13:28:00)    Diagnostic Results    XR Chest Single View  10/25/2017 12:14 PM    Indications: Coarse breath sounds. PICC in place.    Technique: Single frontal view of the chest    Comparison: None        FINDINGS:        Lines and tubes:  A left sided PICC is in place with tip in SVC.    The heart size is top normal.  Mediastinal contours are within normal limits.    There is patchy focal airspace opacity in the left midlung laterally which  could    reflect focal atelectasis although developing infiltrate is not excluded.    Multiple old healed right-sided rib fractures are seen. A linear opacity    projecting over the right lung laterally is felt likely to represent a  skinfold.    There is no evidence of pleural effusion or pneumothorax.    The visualized osseous structures appear otherwise within normal  limits.            IMPRESSION:    Left sided PICC with tip in SVC.    Focal opacity in left midlung could reflect atelectasis or infiltrate.  Multiple    old healed right-sided rib fractures.            ------        SIGNATURE LINE Electronically signed by Derryl Harbor MD, Jumanah Hynson on 10/25/2017 at  15:47:16 EST

## 2017-10-25 NOTE — Progress Notes (Signed)
Subjective    pt get more agitated since transferred to [facility]    required SOMA bed for safety    this AM, still paranoid, not cooperated. able to answer some questions    ROS as noted above, otherwise unremarkable.    History and chart reviewed. lab and images reviewed    Current medications reviewed    pt seen and examined    discussed with nursing staff      Objective    Vitals & Measurements    **T: **98 F  (Oral) **TMIN: **98 F  (Oral) **TMAX: **98.9 F  (Oral) **HR:  **80 **BP: **142/84 **SpO2: **100% **O2 Method (L/min): **Room air    Physical Exam    General:  no acute distress    HEENT: Normocephalic    Respiratory:  Lungs are clear to auscultation, Breath sounds are equal.    Cardiovascular:  Regular rate and rhythm.    Gastrointestinal:  Soft, Non-tender, Non-distended    Extremities: left LE with mild erythema and edema, mild tenderness, improved  from marking    Neurologic:  awake, paranoid    Medications    _Inpatient_    acetaminophen tablet, 650 mg= 2 tab(s), PO, q4hr, PRN    albuterol 2.5 mg/3 mL (0.083%) inhalation solution, 2.5 mg= 3 mL, NEB, q4hr,  PRN    amitriptyline, 100 mg= 2 tab(s), PO, HS    bacitracin 500 units/g topical ointment, 1 app, TOP, ud, PRN    Bactrim DS 800 mg-160 mg oral tablet, 1 tab(s), PO, BID    bisacodyl, 10 mg= 1 supp, PR, Daily, PRN    budesonide 0.5 mg/2 mL inhalation suspension, 0.5 mg= 2 mL, NEB, BID    clindamycin, 600 mg= 50 mL, IV Piggyback, q8hr    Culturelle Digestive Health oral capsule, 1 cap(s), PO, BID    Cymbalta, 60 mg= 2 cap(s), PO, Daily    docusate sodium, 100 mg= 1 cap(s), PO, BID    ferrous sulfate, 325 mg= 1 tab(s), PO, Daily    folic acid, 1 mg= 1 tab(s), PO, Daily    formoterol 20 mcg/2 mL inhalation solution, 20 mcg= 2 mL, NEB, BID    glycopyrrolate 15.6 mcg inhalation capsule, 15.6 mcg= 1 cap(s), INHAL, BID    guaiFENesin, 100 mg= 5 mL, PO, q4hr, PRN    Latuda, 120 mg= 6 tab(s), PO, HS    LORazepam, 0.5 mg= 1 tab(s), PO, q6hr, PRN    Maalox,  30 mL, PO, q4hr, PRN    Milk of Magnesia 8% oral suspension, 2400 mg= 30 mL, PO, Daily, PRN    nystatin topical, 1 app, TOP, TID    OLANZapine odt, 5 mg= 1 tab(s), PO, Daily    ondansetron, 4 mg= 2 mL, IV Push, q8hr, PRN    oxacillin    oxyCODONE, 10 mg= 2 tab(s), PO, q6hr, PRN    pantoprazole, 40 mg= 1 tab(s), PO, BID    Potassium Chloride Oral, 40 mEq= 2 tab(s), PO, q4hr    Sodium Chloride 0.9% Flush, 10 mL, Flush, ud, PRN    Sodium Chloride 0.9% Flush, 10 mL, Flush, q12hr    Sodium Chloride 0.9% Flush, 10 mL, Flush, ud, PRN    Sodium Chloride 0.9% Flush, 10 mL, Flush, q12hr    traMADol, 50 mg= 1 tab(s), PO, q6hr, PRN    Vitamin B12, 500 mcg= 0.5 tab(s), PO, Daily    Vitamin D3, 1000 Unit(s)= 1 tab(s), PO, Daily    ZyPREXA, 5 mg= 2 tab(s), PO,  Daily, PRN    Lab Results    Glucose Lvl: 82 mg/dL (82/95/62 13:08:65)    BUN: 7 mg/dL Low (78/46/96 29:52:84)    Creatinine: 0.482 mg/dL Low (13/24/40 10:27:25)    Afn Amer Glomerular Filtration Rate: >90 (11/01/17 06:02:00)    Non-Afn Amer Glomerular Filtration Rate: >90 (11/01/17 06:02:00)    Sodium Lvl: 145 mmol/L (11/01/17 06:02:00)    Potassium Lvl: 3.2 mmol/L Low (11/01/17 06:02:00)    Chloride: 113 mmol/L High (11/01/17 06:02:00)    CO2: 23 mmol/L (11/01/17 06:02:00)    Anion Gap: 9 (11/01/17 06:02:00)    Calcium Lvl: 7.9 mg/dL Low (36/64/40 34:74:25)    WBC: 6 thous/mm3 (11/01/17 06:02:00)    RBC: 2.31 Mil/mm3 Low (11/01/17 06:02:00)    Hgb: 7.6 Gm/dL Low (95/63/87 56:43:32)    Hct: 23.2 % Low (11/01/17 06:02:00)    Platelet: 311 thous/mm3 (11/01/17 06:02:00)    MCV: 100.4 fL High (11/01/17 06:02:00)    MCH: 32.9 pGm (11/01/17 06:02:00)    MCHC: 32.8 Gm/dL (95/18/84 16:60:63)    RDW-SD: 75.5 fL High (11/01/17 06:02:00)    MPV: 8.7 fL Low (11/01/17 06:02:00)    NRBC Percent: 0 % (11/01/17 06:02:00)    Absolute NRBC Count: 0 thous/mm3 (11/01/17 06:02:00)    C difficile Toxin B by PCR: C diff Not Detected (10/31/17 11:34:00)    Diagnostic Results    Impression and  Plan    67 year old female who presents with confusion and reported fever        SIRS with left leg cellulitis with possible L-spine osteomyelitis    - ID consult appreciated, suggested narrow to PO bactrim for LLE cellulitis,  advise another 3 days.    restart IV oxacillin and continue clindamycin to augment MSSA spinal  osteomyelitis treatment.    Advise at least another 3 weeks of these.    Weekly CBC w/diff, BUN/Cr, LFTs, and ESR/CRP checked and faxed to Dr. Vickki Muff  at [phone]    c. diff negative, cotinue probiotics        toxic metabolic encephalopathy likely related with infection - psych consult  appreciated, continue current meds.    more agitated 4/29, required SOMA bed for safety. continue to monitor, try to  be off SOMA as possible        anemia - no signs of active bleeding. Stool documented guaiac neg in ER, s/p  PRBC transfusion. however H&H dropped from 9.5 > 7.7. check stool guaic  negative, monitor H&H, hold heparin sc, continue PPI. H&H stable since then        acute kidney injury - possible related to bactrim use , monitor I&O and renal  functions. AKI now resolved        hypo-K - replete        Holding lasix and labetalol in the light of low BP        prophylaxis: hold heparin sc with anemia. SCDs    SIGNATURE LINE Electronically signed by Loel Dubonnet MD, Charlene Brooke on 11/01/2017 at  14:57:05 EST

## 2017-10-25 NOTE — Progress Notes (Signed)
Subjective    pt still scream intermittently, overall seems more calm than yesterday    still required SOMA bed overnight    no fever    History and chart reviewed. lab and images reviewed    Current medications reviewed    pt seen and examined    discussed with nursing staff    Objective    Vitals & Measurements    **T: **98.5 F  (Oral) **HR: **99 (Peripheral Pulse) **RR: **16 **BP: **113/73  **SpO2: **98% **O2 Method (L/min): **Room air    Physical Exam    General:  no acute distress    HEENT: Normocephalic    Respiratory:  Lungs are clear to auscultation, Breath sounds are equal.    Cardiovascular:  Regular rate and rhythm.    Gastrointestinal:  Soft, Non-tender, Non-distended    Extremities: left LE with mild erythema and edema, mild tenderness, improved  from marking area    Neurologic:  awake, paranoid, not cooperative    Medications    _Inpatient_    acetaminophen tablet, 650 mg= 2 tab(s), PO, q4hr, PRN    albuterol 2.5 mg/3 mL (0.083%) inhalation solution, 2.5 mg= 3 mL, NEB, q4hr,  PRN    amitriptyline, 100 mg= 2 tab(s), PO, HS    bacitracin 500 units/g topical ointment, 1 app, TOP, ud, PRN    bisacodyl, 10 mg= 1 supp, PR, Daily, PRN    budesonide 0.5 mg/2 mL inhalation suspension, 0.5 mg= 2 mL, NEB, BID    clindamycin, 600 mg= 50 mL, IV Piggyback, q8hr    Culturelle Digestive Health oral capsule, 1 cap(s), PO, BID    Cymbalta, 60 mg= 2 cap(s), PO, Daily    docusate sodium, 100 mg= 1 cap(s), PO, BID    ferrous sulfate, 325 mg= 1 tab(s), PO, Daily    folic acid, 1 mg= 1 tab(s), PO, Daily    formoterol 20 mcg/2 mL inhalation solution, 20 mcg= 2 mL, NEB, BID    glycopyrrolate 15.6 mcg inhalation capsule, 15.6 mcg= 1 cap(s), INHAL, BID    guaiFENesin, 100 mg= 5 mL, PO, q4hr, PRN    Latuda, 120 mg= 6 tab(s), PO, HS    LORazepam, 0.5 mg= 1 tab(s), PO, q6hr, PRN    Maalox, 30 mL, PO, q4hr, PRN    Milk of Magnesia 8% oral suspension, 2400 mg= 30 mL, PO, Daily, PRN    nystatin topical, 1 app, TOP,  TID    OLANZapine odt, 5 mg= 1 tab(s), PO, Daily    ondansetron, 4 mg= 2 mL, IV Push, q8hr, PRN    oxacillin    oxyCODONE, 10 mg= 2 tab(s), PO, q6hr, PRN    pantoprazole, 40 mg= 1 tab(s), PO, BID    Potassium Chloride IV, 10 mEq= 100 mL, IV Piggyback, q1hr    Sodium Chloride 0.9% Flush, 10 mL, Flush, ud, PRN    Sodium Chloride 0.9% Flush, 10 mL, Flush, q12hr    Sodium Chloride 0.9% Flush, 10 mL, Flush, ud, PRN    Sodium Chloride 0.9% Flush, 10 mL, Flush, q12hr    traMADol, 50 mg= 1 tab(s), PO, q6hr, PRN    Vitamin B12, 500 mcg= 0.5 tab(s), PO, Daily    Vitamin D3, 1000 Unit(s)= 1 tab(s), PO, Daily    ZyPREXA, 5 mg= 2 tab(s), PO, Daily, PRN    Lab Results    Glucose Lvl: 98 mg/dL (16/10/96 04:54:09)    BUN: 3 mg/dL Low (81/19/14 78:29:56)    Creatinine: 0.748 mg/dL (21/30/86 57:84:69)  Afn Amer Glomerular Filtration Rate: >90 (11/04/17 06:06:00)    Non-Afn Amer Glomerular Filtration Rate: 84 ml/min/1.79m2 (11/04/17 06:06:00)    Sodium Lvl: 145 mmol/L (11/04/17 06:06:00)    Potassium Lvl: 3.1 mmol/L Low (11/04/17 06:06:00)    Chloride: 118 mmol/L High (11/04/17 06:06:00)    CO2: 22 mmol/L (11/04/17 06:06:00)    Anion Gap: 5 (11/04/17 06:06:00)    Calcium Lvl: 7.6 mg/dL Low (16/10/96 04:54:09)    WBC: 5.1 thous/mm3 (11/04/17 06:06:00)    RBC: 2.15 Mil/mm3 Low (11/04/17 06:06:00)    Hgb: 7.1 Gm/dL Low (81/19/14 78:29:56)    Hct: 22.3 % Low (11/04/17 06:06:00)    Platelet: 268 thous/mm3 (11/04/17 06:06:00)    MCV: 103.7 fL High (11/04/17 06:06:00)    MCH: 33 pGm (11/04/17 06:06:00)    MCHC: 31.8 Gm/dL (21/30/86 57:84:69)    RDW-SD: 80.4 fL High (11/04/17 06:06:00)    MPV: 8.5 fL Low (11/04/17 06:06:00)    NRBC Percent: 0 % (11/04/17 06:06:00)    Absolute NRBC Count: 0 thous/mm3 (11/04/17 06:06:00)    Diagnostic Results    Impression and Plan    68 year old female who presents with confusion and reported fever        SIRS with left leg cellulitis with possible L-spine osteomyelitis    - ID consult appreciated, suggested  narrow to PO bactrim for LLE cellulitis,  advise another 3 days. stopped after 3 days of course    restart IV oxacillin and continue clindamycin to augment MSSA spinal  osteomyelitis treatment.    Advise at least another 3 weeks of these.    Weekly CBC w/diff, BUN/Cr, LFTs, and ESR/CRP checked and faxed to Dr. Vickki Muff  at [phone]    c. diff negative, continue probiotics        toxic metabolic encephalopathy \- psych consult appreciated, continue  mentation change likely related with infection.continue current meds.    now with infection under control, still with paranoid, psych re-eval  appreciated    more agitated 4/29, required SOMA bed for safety. continue to monitor, try to  be off SOMA as possible, may low bed if possible        anemia - no signs of active bleeding. Stool documented guaiac neg in ER, s/p  PRBC transfusion. however H&H dropped from 9.5 > 7.7. check stool guaic  negative, monitor H&H, hold heparin sc, continue PPI. H&H stable around 7-8        acute kidney injury - possible related to bactrim use , monitor I&O and renal  functions. AKI now resolved        hypo-K - replete        Holding lasix and labetalol in the light of relative low BP        prophylaxis: hold heparin sc with anemia. SCDs        likely rehab when out of SOMA for 24 hours    SIGNATURE LINE Electronically signed by Loel Dubonnet MD, Charlene Brooke on 11/04/2017 at  12:48:20 EST

## 2017-10-26 LAB — HX BASIC METABOLIC PANEL
CASE NUMBER: 17866959
HX ANION GAP: 11 — NL (ref 3.0–11.0)
HX BUN: 29 mg/dL — ABNORMAL HIGH (ref 8.0–23.0)
HX CALCIUM LVL: 7.3 mg/dL — ABNORMAL LOW (ref 8.5–10.5)
HX CHLORIDE: 111 mmol/L — ABNORMAL HIGH (ref 98.0–110.0)
HX CO2: 21 mmol/L — NL (ref 21.0–32.0)
HX CREATININE: 1.24 mg/dL — NL (ref 0.55–1.3)
HX GLUCOSE LVL: 79 mg/dL — NL (ref 70.0–110.0)
HX POTASSIUM LVL: 3.5 mmol/L — ABNORMAL LOW (ref 3.6–5.2)
HX SODIUM LVL: 143 mmol/L — NL (ref 136.0–146.0)

## 2017-10-26 LAB — HX .RBC MORPHOLOGY: CASE NUMBER: 2019114000267

## 2017-10-26 LAB — HX .AUTOMATED DIFF
CASE NUMBER: 2019114000267
HX ABSOLUTE BASO COUNT: 0.03 10*3/uL — NL (ref 0.0–0.22)
HX ABSOLUTE EOS COUNT: 0.09 10*3/uL — NL (ref 0.0–0.45)
HX ABSOLUTE LYMPHS COUNT: 1.1 10*3/uL — NL (ref 0.74–5.04)
HX ABSOLUTE MONO COUNT: 0.51 10*3/uL — NL (ref 0.0–1.34)
HX ABSOLUTE NEUTRO COUNT: 7.63 10*3/uL — NL (ref 1.48–7.95)
HX BASOPHILS: 0.3 %
HX EOSINOPHILS: 1 %
HX IMMATURE GRANULOCYTES: 0.4 % — NL (ref 0.0–2.0)
HX LYMPHOCYTES: 11.7 %
HX MONOCYTES: 5.4 %
HX NEUTROPHILS: 81.2 %

## 2017-10-26 LAB — HX CBC W/ DIFF
CASE NUMBER: 2019114000267
HX ABSOLUTE NRBC COUNT: 0 10*3/uL
HX HCT: 25.8 % — ABNORMAL LOW (ref 36.0–47.0)
HX HGB: 8.3 g/dL — ABNORMAL LOW (ref 11.8–16.0)
HX MCH: 32.8 pg — NL (ref 26.0–34.0)
HX MCHC: 32.2 g/dL — NL (ref 31.0–37.0)
HX MCV: 102 fL — ABNORMAL HIGH (ref 80.0–100.0)
HX MPV: 8.5 fL — ABNORMAL LOW (ref 9.4–12.4)
HX NRBC PERCENT: 0 % — NL
HX PLATELET: 304 10*3/uL — NL (ref 150.0–400.0)
HX RBC: 2.53 10*6/uL — ABNORMAL LOW (ref 3.9–5.2)
HX RDW-CV: 23.3 % — ABNORMAL HIGH (ref 11.5–14.5)
HX RDW-SD: 82.3 fL — ABNORMAL HIGH (ref 35.0–51.0)
HX WBC: 9.4 10*3/uL — NL (ref 3.7–11.2)

## 2017-10-26 LAB — HX URINE DIPSTICK W/REFLEX
CASE NUMBER: 2019113002515
HX UA BILIRUBIN: NEGATIVE — NL
HX UA BLOOD: NEGATIVE — NL
HX UA GLUCOSE: NEGATIVE — NL
HX UA KETONES: NEGATIVE — NL
HX UA LEUKOCYTE ESTERASE: 25 WBC/uL — AB
HX UA NITRITE: NEGATIVE — NL
HX UA PH: 5 — NL (ref 5.0–8.0)
HX UA PROTEIN: NEGATIVE — NL
HX UA RBC: <1 — NL (ref 0.0–2.0)
HX UA SPECIFIC GRAVITY: 1.014 — NL (ref 1.003–1.03)
HX UA SQUAMOUS EPITHELIAL: 1 /HPF — NL (ref 0.0–5.0)
HX UA UROBILINOGEN: NEGATIVE — NL
HX UA WBC: 2 /HPF — NL (ref 0.0–5.0)

## 2017-10-26 LAB — HX GLOMERULAR FILTRATION RATE (ESTIMATED)
CASE NUMBER: 2019114000042
HX AFN AMER GLOMERULAR FILTRATION RATE: 53 mL/min/{1.73_m2}
HX NON-AFN AMER GLOMERULAR FILTRATION RATE: 46 mL/min/{1.73_m2}

## 2017-10-26 LAB — HX SEDIMENTATION RATE
CASE NUMBER: 2019114000267
HX SED RATE: 39 mm/h — ABNORMAL HIGH (ref 0.0–30.0)

## 2017-10-26 LAB — HX C-REACTIVE PROTEIN (CRP)
CASE NUMBER: 2019114000042
HX C-REACTIVE PROTEIN: 22.3 mg/dL — ABNORMAL HIGH (ref 0.0–0.8)

## 2017-10-27 LAB — HX POTASSIUM LEVEL
CASE NUMBER: 2019115000243
HX POTASSIUM LVL: 4 mmol/L — NL (ref 3.6–5.2)

## 2017-10-27 LAB — HX CBC W/ INDICES
CASE NUMBER: 2019115000243
HX ABSOLUTE NRBC COUNT: 0 10*3/uL
HX HCT: 29.3 % — ABNORMAL LOW (ref 36.0–47.0)
HX HGB: 9.5 g/dL — ABNORMAL LOW (ref 11.8–16.0)
HX MCH: 33.5 pg — NL (ref 26.0–34.0)
HX MCHC: 32.4 g/dL — NL (ref 31.0–37.0)
HX MCV: 103.2 fL — ABNORMAL HIGH (ref 80.0–100.0)
HX MPV: 8.4 fL — ABNORMAL LOW (ref 9.4–12.4)
HX NRBC PERCENT: 0 % — NL
HX PLATELET: 311 10*3/uL — NL (ref 150.0–400.0)
HX RBC: 2.84 10*6/uL — ABNORMAL LOW (ref 3.9–5.2)
HX RDW-CV: 23.6 % — ABNORMAL HIGH (ref 11.5–14.5)
HX RDW-SD: 86.1 fL — ABNORMAL HIGH (ref 35.0–51.0)
HX WBC: 6.6 10*3/uL — NL (ref 3.7–11.2)

## 2017-10-27 LAB — HX MRSA CULTURE: CASE NUMBER: 2019113002541

## 2017-10-27 LAB — HX BLUE TOP TO HOLD: CASE NUMBER: 2019115001841

## 2017-10-27 LAB — HX URINE CULTURE
CASE NUMBER: 2019114000505
HX F: 10000

## 2017-10-27 LAB — HX LAVENDER TOP TO HOLD: CASE NUMBER: 2019115001841

## 2017-10-27 LAB — HX VANCOMYCIN LEVEL TROUGH
CASE NUMBER: 2019115001841
HX VANCOMYCIN TROUGH: 32 ug/mL — CR (ref 10.0–20.0)

## 2017-10-28 ENCOUNTER — Ambulatory Visit

## 2017-10-28 LAB — HX BASIC METABOLIC PANEL
CASE NUMBER: 2019116000325
HX ANION GAP: 10 — NL (ref 3.0–11.0)
HX BUN: 20 mg/dL — NL (ref 8.0–23.0)
HX CALCIUM LVL: 7.7 mg/dL — ABNORMAL LOW (ref 8.5–10.5)
HX CHLORIDE: 111 mmol/L — ABNORMAL HIGH (ref 98.0–110.0)
HX CO2: 20 mmol/L — ABNORMAL LOW (ref 21.0–32.0)
HX CREATININE: 0.801 mg/dL — NL (ref 0.55–1.3)
HX GLUCOSE LVL: 73 mg/dL — NL (ref 70.0–110.0)
HX POTASSIUM LVL: 3.3 mmol/L — ABNORMAL LOW (ref 3.6–5.2)
HX SODIUM LVL: 141 mmol/L — NL (ref 136.0–146.0)

## 2017-10-28 LAB — HX GLOMERULAR FILTRATION RATE (ESTIMATED)
CASE NUMBER: 2019116000325
HX AFN AMER GLOMERULAR FILTRATION RATE: 89 mL/min/{1.73_m2}
HX NON-AFN AMER GLOMERULAR FILTRATION RATE: 78 mL/min/{1.73_m2}

## 2017-10-28 LAB — HX VANCOMYCIN LEVEL RANDOM
CASE NUMBER: 2019116000325
HX VANCOMYCIN LVL: 18.1 ug/mL — NL (ref 10.0–40.0)

## 2017-10-29 LAB — HX BLOOD CULTURE
CASE NUMBER: 2019113001786
CASE NUMBER: 2019113001787
HX F: NO GROWTH
HX F: NO GROWTH

## 2017-10-30 LAB — HX CBC W/ INDICES
CASE NUMBER: 2019118000144
HX ABSOLUTE NRBC COUNT: 0 10*3/uL
HX HCT: 23.5 % — ABNORMAL LOW (ref 36.0–47.0)
HX HGB: 7.7 g/dL — ABNORMAL LOW (ref 11.8–16.0)
HX MCH: 33.2 pg — NL (ref 26.0–34.0)
HX MCHC: 32.8 g/dL — NL (ref 31.0–37.0)
HX MCV: 101.3 fL — ABNORMAL HIGH (ref 80.0–100.0)
HX MPV: 8.4 fL — ABNORMAL LOW (ref 9.4–12.4)
HX NRBC PERCENT: 0 % — NL
HX PLATELET: 254 10*3/uL — NL (ref 150.0–400.0)
HX RBC: 2.32 10*6/uL — ABNORMAL LOW (ref 3.9–5.2)
HX RDW-CV: 21.6 % — ABNORMAL HIGH (ref 11.5–14.5)
HX RDW-SD: 79.3 fL — ABNORMAL HIGH (ref 35.0–51.0)
HX WBC: 7.5 10*3/uL — NL (ref 3.7–11.2)

## 2017-10-30 LAB — HX BASIC METABOLIC PANEL
CASE NUMBER: 2019118000144
HX ANION GAP: 9 — NL (ref 3.0–11.0)
HX BUN: 12 mg/dL — NL (ref 8.0–23.0)
HX CALCIUM LVL: 7.7 mg/dL — ABNORMAL LOW (ref 8.5–10.5)
HX CHLORIDE: 113 mmol/L — ABNORMAL HIGH (ref 98.0–110.0)
HX CO2: 22 mmol/L — NL (ref 21.0–32.0)
HX CREATININE: 0.669 mg/dL — NL (ref 0.55–1.3)
HX GLUCOSE LVL: 86 mg/dL — NL (ref 70.0–110.0)
HX POTASSIUM LVL: 3 mmol/L — ABNORMAL LOW (ref 3.6–5.2)
HX SODIUM LVL: 144 mmol/L — NL (ref 136.0–146.0)

## 2017-10-30 LAB — HX GLOMERULAR FILTRATION RATE (ESTIMATED)
CASE NUMBER: 2019118000144
HX AFN AMER GLOMERULAR FILTRATION RATE: >90
HX NON-AFN AMER GLOMERULAR FILTRATION RATE: >90

## 2017-10-31 LAB — HX GLOMERULAR FILTRATION RATE (ESTIMATED)
CASE NUMBER: 2019119000153
HX AFN AMER GLOMERULAR FILTRATION RATE: >90
HX NON-AFN AMER GLOMERULAR FILTRATION RATE: >90

## 2017-10-31 LAB — HX CBC W/ INDICES
CASE NUMBER: 2019119000153
HX ABSOLUTE NRBC COUNT: 0 10*3/uL
HX HCT: 24.4 % — ABNORMAL LOW (ref 36.0–47.0)
HX HGB: 8 g/dL — ABNORMAL LOW (ref 11.8–16.0)
HX MCH: 33.1 pg — NL (ref 26.0–34.0)
HX MCHC: 32.8 g/dL — NL (ref 31.0–37.0)
HX MCV: 100.8 fL — ABNORMAL HIGH (ref 80.0–100.0)
HX MPV: 8.5 fL — ABNORMAL LOW (ref 9.4–12.4)
HX NRBC PERCENT: 0 % — NL
HX PLATELET: 273 10*3/uL — NL (ref 150.0–400.0)
HX RBC: 2.42 10*6/uL — ABNORMAL LOW (ref 3.9–5.2)
HX RDW-CV: 20.9 % — ABNORMAL HIGH (ref 11.5–14.5)
HX RDW-SD: 77 fL — ABNORMAL HIGH (ref 35.0–51.0)
HX WBC: 7.2 10*3/uL — NL (ref 3.7–11.2)

## 2017-10-31 LAB — HX C DIFFICILE TOXIN B BY PCR
CASE NUMBER: 2019119001581
HX C DIFF 027 BY PCR: NEGATIVE
HX C DIFFICILE TOXIN B BY PCR: NOT DETECTED

## 2017-10-31 LAB — HX BASIC METABOLIC PANEL
CASE NUMBER: 2019119000153
HX ANION GAP: 8 — NL (ref 3.0–11.0)
HX BUN: 10 mg/dL — NL (ref 8.0–23.0)
HX CALCIUM LVL: 7.8 mg/dL — ABNORMAL LOW (ref 8.5–10.5)
HX CHLORIDE: 112 mmol/L — ABNORMAL HIGH (ref 98.0–110.0)
HX CO2: 22 mmol/L — NL (ref 21.0–32.0)
HX CREATININE: 0.63 mg/dL — NL (ref 0.55–1.3)
HX GLUCOSE LVL: 86 mg/dL — NL (ref 70.0–110.0)
HX POTASSIUM LVL: 3.2 mmol/L — ABNORMAL LOW (ref 3.6–5.2)
HX SODIUM LVL: 142 mmol/L — NL (ref 136.0–146.0)

## 2017-11-01 LAB — HX CBC W/ INDICES
CASE NUMBER: 2019120000111
HX ABSOLUTE NRBC COUNT: 0 10*3/uL
HX HCT: 23.2 % — ABNORMAL LOW (ref 36.0–47.0)
HX HGB: 7.6 g/dL — ABNORMAL LOW (ref 11.8–16.0)
HX MCH: 32.9 pg — NL (ref 26.0–34.0)
HX MCHC: 32.8 g/dL — NL (ref 31.0–37.0)
HX MCV: 100.4 fL — ABNORMAL HIGH (ref 80.0–100.0)
HX MPV: 8.7 fL — ABNORMAL LOW (ref 9.4–12.4)
HX NRBC PERCENT: 0 % — NL
HX PLATELET: 311 10*3/uL — NL (ref 150.0–400.0)
HX RBC: 2.31 10*6/uL — ABNORMAL LOW (ref 3.9–5.2)
HX RDW-CV: 20.8 % — ABNORMAL HIGH (ref 11.5–14.5)
HX RDW-SD: 75.5 fL — ABNORMAL HIGH (ref 35.0–51.0)
HX WBC: 6 10*3/uL — NL (ref 3.7–11.2)

## 2017-11-01 LAB — HX GLOMERULAR FILTRATION RATE (ESTIMATED)
CASE NUMBER: 2019120000111
HX AFN AMER GLOMERULAR FILTRATION RATE: >90
HX NON-AFN AMER GLOMERULAR FILTRATION RATE: >90

## 2017-11-01 LAB — HX BASIC METABOLIC PANEL
CASE NUMBER: 2019120000111
HX ANION GAP: 9 — NL (ref 3.0–11.0)
HX BUN: 7 mg/dL — ABNORMAL LOW (ref 8.0–23.0)
HX CALCIUM LVL: 7.9 mg/dL — ABNORMAL LOW (ref 8.5–10.5)
HX CHLORIDE: 113 mmol/L — ABNORMAL HIGH (ref 98.0–110.0)
HX CO2: 23 mmol/L — NL (ref 21.0–32.0)
HX CREATININE: 0.482 mg/dL — ABNORMAL LOW (ref 0.55–1.3)
HX GLUCOSE LVL: 82 mg/dL — NL (ref 70.0–110.0)
HX POTASSIUM LVL: 3.2 mmol/L — ABNORMAL LOW (ref 3.6–5.2)
HX SODIUM LVL: 145 mmol/L — NL (ref 136.0–146.0)

## 2017-11-02 LAB — HX CBC W/ INDICES
CASE NUMBER: 2019121000120
HX ABSOLUTE NRBC COUNT: 0 10*3/uL
HX HCT: 22.9 % — ABNORMAL LOW (ref 36.0–47.0)
HX HGB: 7.2 g/dL — ABNORMAL LOW (ref 11.8–16.0)
HX MCH: 32.6 pg — NL (ref 26.0–34.0)
HX MCHC: 31.4 g/dL — NL (ref 31.0–37.0)
HX MCV: 103.6 fL — ABNORMAL HIGH (ref 80.0–100.0)
HX MPV: 8.7 fL — ABNORMAL LOW (ref 9.4–12.4)
HX NRBC PERCENT: 0 % — NL
HX PLATELET: 326 10*3/uL — NL (ref 150.0–400.0)
HX RBC: 2.21 10*6/uL — ABNORMAL LOW (ref 3.9–5.2)
HX RDW-CV: 21.8 % — ABNORMAL HIGH (ref 11.5–14.5)
HX RDW-SD: 81.6 fL — ABNORMAL HIGH (ref 35.0–51.0)
HX WBC: 4.5 10*3/uL — NL (ref 3.7–11.2)

## 2017-11-02 LAB — HX BASIC METABOLIC PANEL
CASE NUMBER: 2019121000120
HX ANION GAP: 10 — NL (ref 3.0–11.0)
HX BUN: 5 mg/dL — ABNORMAL LOW (ref 8.0–23.0)
HX CALCIUM LVL: 7.7 mg/dL — ABNORMAL LOW (ref 8.5–10.5)
HX CHLORIDE: 116 mmol/L — ABNORMAL HIGH (ref 98.0–110.0)
HX CO2: 20 mmol/L — ABNORMAL LOW (ref 21.0–32.0)
HX CREATININE: 0.53 mg/dL — ABNORMAL LOW (ref 0.55–1.3)
HX GLUCOSE LVL: 57 mg/dL — ABNORMAL LOW (ref 70.0–110.0)
HX POTASSIUM LVL: 3.4 mmol/L — ABNORMAL LOW (ref 3.6–5.2)
HX SODIUM LVL: 146 mmol/L — NL (ref 136.0–146.0)

## 2017-11-02 LAB — HX GLOMERULAR FILTRATION RATE (ESTIMATED)
CASE NUMBER: 2019121000120
HX AFN AMER GLOMERULAR FILTRATION RATE: >90
HX NON-AFN AMER GLOMERULAR FILTRATION RATE: >90

## 2017-11-03 LAB — HX CBC W/ INDICES
CASE NUMBER: 2019122000123
HX ABSOLUTE NRBC COUNT: 0 10*3/uL
HX HCT: 22.4 % — ABNORMAL LOW (ref 36.0–47.0)
HX HGB: 7 g/dL — ABNORMAL LOW (ref 11.8–16.0)
HX MCH: 32.4 pg — NL (ref 26.0–34.0)
HX MCHC: 31.3 g/dL — NL (ref 31.0–37.0)
HX MCV: 103.7 fL — ABNORMAL HIGH (ref 80.0–100.0)
HX MPV: 8.7 fL — ABNORMAL LOW (ref 9.4–12.4)
HX NRBC PERCENT: 0 % — NL
HX PLATELET: 329 10*3/uL — NL (ref 150.0–400.0)
HX RBC: 2.16 10*6/uL — ABNORMAL LOW (ref 3.9–5.2)
HX RDW-CV: 21.9 % — ABNORMAL HIGH (ref 11.5–14.5)
HX RDW-SD: 82.2 fL — ABNORMAL HIGH (ref 35.0–51.0)
HX WBC: 4.4 10*3/uL — NL (ref 3.7–11.2)

## 2017-11-03 LAB — HX BASIC METABOLIC PANEL
CASE NUMBER: 2019122000123
HX ANION GAP: 7 — NL (ref 3.0–11.0)
HX BUN: 5 mg/dL — ABNORMAL LOW (ref 8.0–23.0)
HX CALCIUM LVL: 7.5 mg/dL — ABNORMAL LOW (ref 8.5–10.5)
HX CHLORIDE: 116 mmol/L — ABNORMAL HIGH (ref 98.0–110.0)
HX CO2: 22 mmol/L — NL (ref 21.0–32.0)
HX CREATININE: 0.716 mg/dL — NL (ref 0.55–1.3)
HX GLUCOSE LVL: 94 mg/dL — NL (ref 70.0–110.0)
HX POTASSIUM LVL: 3.1 mmol/L — ABNORMAL LOW (ref 3.6–5.2)
HX SODIUM LVL: 145 mmol/L — NL (ref 136.0–146.0)

## 2017-11-03 LAB — HX GLOMERULAR FILTRATION RATE (ESTIMATED)
CASE NUMBER: 2019122000123
HX AFN AMER GLOMERULAR FILTRATION RATE: >90
HX NON-AFN AMER GLOMERULAR FILTRATION RATE: 89 mL/min/{1.73_m2}

## 2017-11-04 LAB — HX CBC W/ INDICES
CASE NUMBER: 2019123000087
HX ABSOLUTE NRBC COUNT: 0 10*3/uL
HX HCT: 22.3 % — ABNORMAL LOW (ref 36.0–47.0)
HX HGB: 7.1 g/dL — ABNORMAL LOW (ref 11.8–16.0)
HX MCH: 33 pg — NL (ref 26.0–34.0)
HX MCHC: 31.8 g/dL — NL (ref 31.0–37.0)
HX MCV: 103.7 fL — ABNORMAL HIGH (ref 80.0–100.0)
HX MPV: 8.5 fL — ABNORMAL LOW (ref 9.4–12.4)
HX NRBC PERCENT: 0 % — NL
HX PLATELET: 268 10*3/uL — NL (ref 150.0–400.0)
HX RBC: 2.15 10*6/uL — ABNORMAL LOW (ref 3.9–5.2)
HX RDW-CV: 21.2 % — ABNORMAL HIGH (ref 11.5–14.5)
HX RDW-SD: 80.4 fL — ABNORMAL HIGH (ref 35.0–51.0)
HX WBC: 5.1 10*3/uL — NL (ref 3.7–11.2)

## 2017-11-04 LAB — HX BASIC METABOLIC PANEL
CASE NUMBER: 2019123000087
HX ANION GAP: 5 — NL (ref 3.0–11.0)
HX BUN: 3 mg/dL — ABNORMAL LOW (ref 8.0–23.0)
HX CALCIUM LVL: 7.6 mg/dL — ABNORMAL LOW (ref 8.5–10.5)
HX CHLORIDE: 118 mmol/L — ABNORMAL HIGH (ref 98.0–110.0)
HX CO2: 22 mmol/L — NL (ref 21.0–32.0)
HX CREATININE: 0.748 mg/dL — NL (ref 0.55–1.3)
HX GLUCOSE LVL: 98 mg/dL — NL (ref 70.0–110.0)
HX POTASSIUM LVL: 3.1 mmol/L — ABNORMAL LOW (ref 3.6–5.2)
HX SODIUM LVL: 145 mmol/L — NL (ref 136.0–146.0)

## 2017-11-04 LAB — HX GLOMERULAR FILTRATION RATE (ESTIMATED)
CASE NUMBER: 2019123000087
HX AFN AMER GLOMERULAR FILTRATION RATE: >90
HX NON-AFN AMER GLOMERULAR FILTRATION RATE: 84 mL/min/{1.73_m2}

## 2017-11-04 LAB — HX MAGNESIUM LEVEL
CASE NUMBER: 17913685
HX MAGNESIUM LVL: 1.8 mg/dL — NL (ref 1.7–2.5)

## 2017-11-05 LAB — HX BASIC METABOLIC PANEL
CASE NUMBER: 2019124000096
HX ANION GAP: 4 — NL (ref 3.0–11.0)
HX BUN: 2 mg/dL — ABNORMAL LOW (ref 8.0–23.0)
HX CALCIUM LVL: 7.3 mg/dL — ABNORMAL LOW (ref 8.5–10.5)
HX CHLORIDE: 119 mmol/L — ABNORMAL HIGH (ref 98.0–110.0)
HX CO2: 22 mmol/L — NL (ref 21.0–32.0)
HX CREATININE: 0.707 mg/dL — NL (ref 0.55–1.3)
HX GLUCOSE LVL: 89 mg/dL — NL (ref 70.0–110.0)
HX POTASSIUM LVL: 3.8 mmol/L — NL (ref 3.6–5.2)
HX SODIUM LVL: 145 mmol/L — NL (ref 136.0–146.0)

## 2017-11-05 LAB — HX GLOMERULAR FILTRATION RATE (ESTIMATED)
CASE NUMBER: 2019124000096
HX AFN AMER GLOMERULAR FILTRATION RATE: >90
HX NON-AFN AMER GLOMERULAR FILTRATION RATE: >90

## 2017-11-05 LAB — HX C-REACTIVE PROTEIN (CRP)
CASE NUMBER: 17916237
HX C-REACTIVE PROTEIN: 1.74 mg/dL — ABNORMAL HIGH (ref 0.0–0.8)

## 2017-11-05 LAB — HX SEDIMENTATION RATE
CASE NUMBER: 17916237
HX SED RATE: 13 mm/h — NL (ref 0.0–30.0)

## 2017-11-05 LAB — HX CBC W/ INDICES
CASE NUMBER: 2019124000096
HX ABSOLUTE NRBC COUNT: 0 10*3/uL
HX HCT: 22.1 % — ABNORMAL LOW (ref 36.0–47.0)
HX HGB: 7 g/dL — ABNORMAL LOW (ref 11.8–16.0)
HX MCH: 32.6 pg — NL (ref 26.0–34.0)
HX MCHC: 31.7 g/dL — NL (ref 31.0–37.0)
HX MCV: 102.8 fL — ABNORMAL HIGH (ref 80.0–100.0)
HX MPV: 8.3 fL — ABNORMAL LOW (ref 9.4–12.4)
HX NRBC PERCENT: 0 % — NL
HX PLATELET: 273 10*3/uL — NL (ref 150.0–400.0)
HX RBC: 2.15 10*6/uL — ABNORMAL LOW (ref 3.9–5.2)
HX RDW-CV: 21.8 % — ABNORMAL HIGH (ref 11.5–14.5)
HX RDW-SD: 82 fL — ABNORMAL HIGH (ref 35.0–51.0)
HX WBC: 4.3 10*3/uL — NL (ref 3.7–11.2)

## 2017-11-06 LAB — HX HEMOGLOBIN AND HEMATOCRIT
CASE NUMBER: 2019125000062
HX HCT: 22.7 % — ABNORMAL LOW (ref 36.0–47.0)
HX HGB: 7.1 g/dL — ABNORMAL LOW (ref 11.8–16.0)

## 2017-11-07 LAB — HX HEMOGLOBIN AND HEMATOCRIT
CASE NUMBER: 2019126000060
HX HCT: 22.1 % — ABNORMAL LOW (ref 36.0–47.0)
HX HGB: 7.1 g/dL — ABNORMAL LOW (ref 11.8–16.0)

## 2017-11-08 LAB — HX HEMOGLOBIN AND HEMATOCRIT
CASE NUMBER: 2019127000076
HX HCT: 28.5 % — ABNORMAL LOW (ref 36.0–47.0)
HX HGB: 9.1 g/dL — ABNORMAL LOW (ref 11.8–16.0)

## 2017-11-10 ENCOUNTER — Inpatient Hospital Stay
Admit: 2017-11-10 | Disposition: A | Discharge status: Skilled Nursing Facility | Source: Home / Self Care | Attending: Internal Medicine | Admitting: Internal Medicine

## 2017-11-10 LAB — HX IRON PROFILE
CASE NUMBER: 17943466
HX % IRON BOUND: >100 — ABNORMAL HIGH (ref 10.0–50.0)
HX FERRITIN LVL: 1324 ng/mL — ABNORMAL HIGH (ref 11.0–307.0)
HX IRON: 57 ug/dL — NL (ref 30.0–160.0)
HX TIBC: 51 ug/dL — ABNORMAL LOW (ref 250.0–450.0)

## 2017-11-10 LAB — HX ANTIBODY SCREEN
CASE NUMBER: 2019129001829
HX ANTIBODY SCREEN AUTOMATED: NEGATIVE — NL

## 2017-11-10 LAB — HX SST GOLD TUBE TO HOLD: CASE NUMBER: 2019129001829

## 2017-11-10 LAB — HX GLOMERULAR FILTRATION RATE (ESTIMATED)
CASE NUMBER: 2019129001829
HX AFN AMER GLOMERULAR FILTRATION RATE: 70 mL/min/{1.73_m2}
HX NON-AFN AMER GLOMERULAR FILTRATION RATE: 61 mL/min/{1.73_m2}

## 2017-11-10 LAB — HX CBC W/ DIFF
CASE NUMBER: 2019129001829
HX ABSOLUTE NRBC COUNT: 0 10*3/uL
HX HCT: 22.8 % — ABNORMAL LOW (ref 36.0–47.0)
HX HGB: 7.4 g/dL — ABNORMAL LOW (ref 11.8–16.0)
HX MCH: 33.6 pg — NL (ref 26.0–34.0)
HX MCHC: 32.5 g/dL — NL (ref 31.0–37.0)
HX MCV: 103.6 fL — ABNORMAL HIGH (ref 80.0–100.0)
HX MPV: 9 fL — ABNORMAL LOW (ref 9.4–12.4)
HX NRBC PERCENT: 0 % — NL
HX PLATELET: 333 10*3/uL — NL (ref 150.0–400.0)
HX RBC: 2.2 10*6/uL — ABNORMAL LOW (ref 3.9–5.2)
HX RDW-CV: 20.2 % — ABNORMAL HIGH (ref 11.5–14.5)
HX RDW-SD: 75.9 fL — ABNORMAL HIGH (ref 35.0–51.0)
HX WBC: 8.3 10*3/uL — NL (ref 3.7–11.2)

## 2017-11-10 LAB — HX COMPREHENSIVE METABOLIC PANEL
CASE NUMBER: 2019129001829
HX ALBUMIN LVL: 1.4 g/dL — ABNORMAL LOW (ref 3.2–5.0)
HX ALKALINE PHOSPHATASE: 176 U/L — ABNORMAL HIGH (ref 30.0–117.0)
HX ALT: 13 U/L — NL (ref 6.0–55.0)
HX ANION GAP: 9 — NL (ref 3.0–11.0)
HX AST: 24 U/L — NL (ref 6.0–40.0)
HX BILIRUBIN TOTAL: 0.4 mg/dL — NL (ref 0.2–1.2)
HX BUN: 7 mg/dL — ABNORMAL LOW (ref 8.0–23.0)
HX CALCIUM LVL: 7.3 mg/dL — ABNORMAL LOW (ref 8.5–10.5)
HX CHLORIDE: 108 mmol/L — NL (ref 98.0–110.0)
HX CO2: 26 mmol/L — NL (ref 21.0–32.0)
HX CREATININE: 0.98 mg/dL — NL (ref 0.55–1.3)
HX GLUCOSE LVL: 95 mg/dL — NL (ref 70.0–110.0)
HX POTASSIUM LVL: 1.9 mmol/L — CR (ref 3.6–5.2)
HX SODIUM LVL: 143 mmol/L — NL (ref 136.0–146.0)
HX TOTAL PROTEIN: 5.5 g/dL — ABNORMAL LOW (ref 6.0–8.4)

## 2017-11-10 LAB — HX ABO/RH TYPE
CASE NUMBER: 2019129001829
HX ABO/RH TYPE: A POS — NL

## 2017-11-10 LAB — HX .AUTOMATED DIFF
CASE NUMBER: 2019129001829
HX ABSOLUTE BASO COUNT: 0.03 10*3/uL — NL (ref 0.0–0.22)
HX ABSOLUTE EOS COUNT: 0.03 10*3/uL — NL (ref 0.0–0.45)
HX ABSOLUTE LYMPHS COUNT: 1.09 10*3/uL — NL (ref 0.74–5.04)
HX ABSOLUTE MONO COUNT: 0.67 10*3/uL — NL (ref 0.0–1.34)
HX ABSOLUTE NEUTRO COUNT: 6.47 10*3/uL — NL (ref 1.48–7.95)
HX BASOPHILS: 0.4 %
HX EOSINOPHILS: 0.4 %
HX IMMATURE GRANULOCYTES: 0.5 % — NL (ref 0.0–2.0)
HX LYMPHOCYTES: 13.1 %
HX MONOCYTES: 8 %
HX NEUTROPHILS: 77.6 %

## 2017-11-10 LAB — HX .RBC MORPHOLOGY: CASE NUMBER: 2019129001829

## 2017-11-10 LAB — HX PT
CASE NUMBER: 2019129001829
HX INR: 1.2
HX PT: 11.8 s — ABNORMAL HIGH (ref 9.3–11.6)

## 2017-11-10 LAB — HX BASIC METABOLIC PANEL
CASE NUMBER: 2019129003390
HX ANION GAP: 9 — NL (ref 3.0–11.0)
HX BUN: 6 mg/dL — ABNORMAL LOW (ref 8.0–23.0)
HX CALCIUM LVL: 7.3 mg/dL — ABNORMAL LOW (ref 8.5–10.5)
HX CHLORIDE: 109 mmol/L — NL (ref 98.0–110.0)
HX CO2: 26 mmol/L — NL (ref 21.0–32.0)
HX CREATININE: 1.05 mg/dL — NL (ref 0.55–1.3)
HX GLUCOSE LVL: 113 mg/dL — ABNORMAL HIGH (ref 70.0–110.0)
HX POTASSIUM LVL: 2.8 mmol/L — ABNORMAL LOW (ref 3.6–5.2)
HX SODIUM LVL: 144 mmol/L — NL (ref 136.0–146.0)

## 2017-11-10 LAB — HX PTT
CASE NUMBER: 2019129001829
HX APTT: 21 s — ABNORMAL LOW (ref 23.0–32.0)

## 2017-11-10 LAB — HX MAGNESIUM LEVEL
CASE NUMBER: 17943462
HX MAGNESIUM LVL: 1.5 mg/dL — ABNORMAL LOW (ref 1.7–2.5)

## 2017-11-10 NOTE — Progress Notes (Signed)
Subjective    Transfused 1 unit of blood overnight. Febrile all evening from 4pm to 10pm.  Associate chills. Denies nausea, vomiting. Has some back pain. Remains alert.    Review of Systems    No chest pain or shortness of breath    Objective    Vitals & Measurements    **T: **98.3 F  (Oral) **TMIN: **98.3 F  (Oral) **TMAX: **101.4 F  (Oral)  **HR: **86 **RR: **15 **BP: **137/81 **SpO2: **100% **O2 Rate: **2 **O2 Method  (L/min): **Nasal cannula    Physical Exam    General: elderly lady, appears older than state age    HEENT: EOM intact, no oral lesions    Neck:Supple    CVS: regular rate and rhythm, S1S2+ no m/r/g    RS: Non-labored breathing, clear bilateral ant    Abd: Soft, non-tender, no hepatosplenomegaly    Ext: Bilateral pedal edema    MSK: focal tenderness over lumbar spine    PICC line c/d/oi no eryhtema or tenderness. [1]    Medications    _Inpatient_    acetaminophen 325 mg oral tablet, 650 mg= 2 tab(s), PO, q4hr, PRN    albuterol 2.5 mg/3 mL (0.083%) inhalation solution, 2.5 mg= 3 mL, NEB, q4hr,  PRN    amitriptyline, 100 mg= 2 tab(s), PO, HS    budesonide 0.5 mg/2 mL inhalation suspension, 0.5 mg= 2 mL, NEB, BID    ceFAZolin    clindamycin, 600 mg= 50 mL, IV, q8hr    Cymbalta, 60 mg= 2 cap(s), PO, Daily    docusate, 100 mg= 1 cap(s), PO, BID, PRN    folic acid, 1 mg= 1 tab(s), PO, Daily    formoterol 20 mcg/2 mL inhalation solution, 20 mcg= 2 mL, NEB, BID    glycopyrrolate 15.6 mcg inhalation capsule, 15.6 mcg= 1 cap(s), INHAL, BID    LORazepam, 0.5 mg= 1 tab(s), PO, q6hr, PRN    Maalox Antacid Antigas Regular Strength oral suspension, 30 mL, PO, q6hr, PRN    OLANZapine odt, 5 mg= 1 tab(s), PO, Daily    ondansetron, 4 mg= 2 mL, IV Push, q8hr, PRN    oxyCODONE, 10 mg= 2 tab(s), PO, q6hr, PRN    Potassium Chloride Oral, 40 mEq= 2 tab(s), PO, q4hr    Protonix, 40 mg= 1 tab(s), PO, BID    Tums, 500 mg= 1 tab(s), PO, TID, PRN    Vitamin B12, 500 mcg= 0.5 tab(s), PO, Daily    Lab Results    Occult  Blood Stool I POC: Negative (11/14/17 06:00:00)    Blood Glucose POC: 101 mg/dL (16/10/96 04:54:09)    Glucose Lvl: 78 mg/dL (81/19/14 78:29:56)    BUN: 6 mg/dL Low (21/30/86 57:84:69)    Creatinine: 0.719 mg/dL (62/95/28 41:32:44)    Afn Amer Glomerular Filtration Rate: >90 (11/14/17 05:18:00)    Non-Afn Amer Glomerular Filtration Rate: 88 ml/min/1.30m2 (11/14/17 05:18:00)    Sodium Lvl: 143 mmol/L (11/14/17 05:18:00)    Potassium Lvl: 3.5 mmol/L Low (11/14/17 05:18:00)    Chloride: 111 mmol/L High (11/14/17 05:18:00)    CO2: 25 mmol/L (11/14/17 05:18:00)    Anion Gap: 7 (11/14/17 05:18:00)    Calcium Lvl: 7.3 mg/dL Low (07/06/70 53:66:44)    WBC: 3.5 thous/mm3 Low (11/14/17 05:18:00)    RBC: 2.38 Mil/mm3 Low (11/14/17 05:18:00)    Hgb: 8 Gm/dL Low (03/47/42 59:56:38)    Hct: 25 % Low (11/14/17 05:18:00)    Platelet: 255 thous/mm3 (11/14/17 05:18:00)    MCV: 105 fL  High (11/14/17 05:18:00)    MCH: 33.6 pGm (11/14/17 05:18:00)    MCHC: 32 Gm/dL (36/64/40 34:74:25)    RDW-SD: 82.7 fL High (11/14/17 05:18:00)    MPV: 9 fL Low (11/14/17 05:18:00)    NRBC Percent: 0 % (11/14/17 05:18:00)    Absolute NRBC Count: 0 thous/mm3 (11/14/17 05:18:00)    TRANSFUSED: TRANSFUSED (11/13/17 23:49:00)    Diagnostic Results    Impression and Plan    67 year old female with chronic diastolic CHF, bipolar disorder, depression,  anxiety, chronic kidney disease II, essential hypertension, COPD, GERD,  chronic low back pain, h/o morbid obesity s/p gastric bypass,  choledocholithiasis with MSSA osteomyelitis and epidural abscess and MSSA  bacteremia admitted with hypokalemia        1\. Hypokalemia: Urine spot potassium of 21mmol/dl and TTKG is 13, suggesting  renal wasting. Consulted Dr. Talmage Nap, patient may need additional  supplementation. Await additional recommendations. Most likely secondary to  potassium wasting from oxacillin.        2\. MSSA vertebral osteomyelitis: course extended after recent MRI showed  residual disease.  Tolerating cefazolin and clindamycin. Had fevers last night  so will need to rule out complications.        3\. Anemia: likely 2/2 chronic disease - iron, b12, folate all normal.  Transfused 1 unit of blood with appropriate Hb response. [2]        4\. Fevers: previous MRI showed worsening abscess necessitating extension of  antibiotics. Will repeat MRI today to determine if there is still a collection  and if that needs to be drained. Ordered urinalysis. NO dyspnea suggestive of  PNA. Blood cultures remain negative.        Code: Full    [1] Progress/SOAP Note; Rennie Natter 11/13/2017 14:02 EDT    [2] Progress/SOAP Note; Rennie Natter 11/13/2017 14:02 EDT    SIGNATURE LINE Electronically signed by Lattie Haw MD, Madina Galati on 11/14/2017 at  11:37:21 EST

## 2017-11-10 NOTE — H&P (Signed)
Chief Complaint    Abnormal labs    History of Present Illness    67 year old lady from [place] , presents today for evaluation for anemia.  Patient has a history of bipolar disorder, anxiety and depression, has been  evaluated by psychiatry multiple times in the past states she wants to go home  and claims she is being abducted by the Hospital. History taken is therefore  very limited. She has a PICC line in place for treatment of spinal  osteomyelitis and MSSA bacteremia. Her potassium magnesium were also found to  be low and these were replaced by ER physician however still low magnesium at  1.5 potassium is 2.8. Her H&H are also low 7.4/22.8, down from 9.1/28.5, 3  days ago. No evidence of active bleeding however physical examination is  limited due to patient behavior, received Zyprexa. Stable vitals. Patient had  an endoscopy in 2018 that showed ulcer in the distal esophagus however repeat  endoscopy 3 months after showed no evidence of active bleeding. No recent  colonoscopy.        We will be admitted for further management.        PAST MEDICAL HISTORY: Neuropathy, COPD, anxiety, bipolar disorder, MSSA  bacteremia and spinal osteomyelitis, chronic pain.        ALLERGIES:  No known drug allergies.        SOCIAL HISTORY: Unobtainable from the patient.        FAMILY HISTORY: Unobtainable from the patient.          Review of Systems    Unobtainable    Code Status    Code Status - Ordered    -- 11/10/17 21:02:00 EDT, Full Resuscitation, Constant Order    Physical Exam    Vitals & Measurements    **T: **98.1 F  (Oral) **HR: **94 **RR: **21 **BP: **121/72 **SpO2: **99% **O2  Method (L/min): **Room air **WT: **74.843 Kg    Patient is aggressive and does not allows physical exam      Impression and Plan    #Abnormal lab, normocytic anemia, hypokalemia and hypomagnesemia, we will  replace aggressively, no indication for blood transfusion for now. We get an  iron panel. Stool for occult blood.    #Bipolar  disorder, on Zyprexa.    #Neuropathy on amitriptyline. On Cymbalta.    #COPD on inhalers.    #Anxiety and benzos.    #MSSA bacteremia spinal osteomyelitis on Clinda and oxacillin.    #Chronic pain on oxycodone.    #DVT and GI prophylaxis, inpatient, ICC due to electrolyte abnormality, more  than 2 midnights, full code.    CMS Two-Midnight Rule        Problem List/Past Medical History    Ongoing    Anemia    Anxiety    Asthma    Bipolar 1 disorder    CHF - Congestive heart failure    CKD - chronic kidney disease    COPD - Chronic obstructive pulmonary disease    Depression    GERD    Hypertension    Neuropathy    Historical    Tobacco use    Procedure/Surgical History    Transfusion of Nonautologous Red Blood Cells into Peripheral Vein,  Percutaneous Approach (10/25/2017), Transfusion of Nonautologous Red Blood  Cells into Peripheral Vein, Percutaneous Approach (09/13/2017), Insertion of  Endotracheal Airway into Trachea, Via Natural or Artificial Opening  (09/04/2017), Respiratory Ventilation, 24-96 Consecutive Hours (09/04/2017),  Inspection of Spinal Cord, Percutaneous Approach (09/03/2017),  Transfusion of  Nonautologous Red Blood Cells into Peripheral Vein, Percutaneous Approach  (08/20/2017), EGD with Anesthesia (N/A) (03/19/2017), Inspection of Upper  Intestinal Tract, Via Natural or Artificial Opening Endoscopic (03/19/2017),  Transfusion of Nonautologous Red Blood Cells into Peripheral Vein,  Percutaneous Approach (03/16/2017), EGD with Anesthesia (N/A) (12/23/2016),  Inspection of Upper Intestinal Tract, Via Natural or Artificial Opening  Endoscopic (12/23/2016), Transfusion of Nonautologous Red Blood Cells into  Peripheral Vein, Percutaneous Approach (12/20/2016), Drainage of Common Bile  Duct with Drainage Device, Percutaneous Approach (08/06/2016), Removal of  Drainage Device from Hepatobiliary Duct, External Approach (08/06/2016),  Excision of Ampulla of Vater, Percutaneous Approach, Diagnostic  (08/05/2016),  Drainage of Common Bile Duct with Drainage Device, Percutaneous Approach  (07/29/2016), Fluoroscopy of Biliary and Pancreatic Ducts using Other Contrast  (07/29/2016), TRAN NAUTO RED BLD CLL PERIPH VN PC (10/04/2014), Gastric bypass  surgery (2012).    Social History    _Alcohol_    Current, 1-2 times per week    Past    _Substance Abuse_    Never    _Tobacco_    Former smoker, Cigarettes    Family History    High blood pressure: Father, Sister and Brother.    Allergies    NKA    Medications    _Inpatient_    acetaminophen 325 mg oral tablet, 650 mg= 2 tab(s), PO, q4hr, PRN    albuterol 2.5 mg/3 mL (0.083%) inhalation solution, 2.5 mg= 3 mL, NEB, q4hr,  PRN    amitriptyline, 100 mg= 2 tab(s), PO, HS    budesonide 0.5 mg/2 mL inhalation suspension, 0.5 mg= 2 mL, NEB, BID    clindamycin, 600 mg= 50 mL, IV, q8hr    Cymbalta, 60 mg= 2 cap(s), PO, Daily    docusate, 100 mg= 1 cap(s), PO, BID, PRN    folic acid, 1 mg= 1 tab(s), PO, Daily    formoterol 20 mcg/2 mL inhalation solution, 20 mcg= 2 mL, NEB, BID    glycopyrrolate 15.6 mcg inhalation capsule, 15.6 mcg= 1 cap(s), INHAL, BID    LORazepam, 0.5 mg= 1 tab(s), PO, q6hr, PRN    magnesium sulfate, 1 Gm, IV Piggyback, q1hr    OLANZapine odt, 5 mg= 1 tab(s), PO, Daily    ondansetron, 4 mg= 2 mL, IV Push, q8hr, PRN    oxacillin    oxyCODONE, 10 mg= 2 tab(s), PO, q6hr, PRN    Protonix, 40 mg= 1 tab(s), PO, BID    Vitamin B12, 500 mcg= 0.5 tab(s), PO, Daily    ZyPREXA, 5 mg= 0.5 EA, IM, Once    _Home_    acetaminophen 325 mg oral tablet, 650 mg, PO, q4hr, PRN    albuterol 2.5 mg/3 mL (0.083%) inhalation solution, 2.5 mg= 3 mL, NEB, q4hr,  PRN    amitriptyline, 100 mg, PO, HS    Breo Ellipta 100 mcg-25 mcg/inh inhalation powder, 1 puff(s), INHAL, Daily    clindamycin 600 mg/50 mL-D5% intravenous solution, 600 mg, IV, q8hr    Culturelle Digestive Health oral capsule, 1 cap(s), PO, BID    Cymbalta 60 mg oral delayed release capsule, 60 mg= 1 cap(s), PO,  Daily    docusate sodium 100 mg oral capsule, 100 mg= 1 cap(s), PO, BID, PRN, 2 refills    folic acid 1 mg oral tablet, 1 mg= 1 tab(s), PO, Daily    Lasix 40 mg oral tablet, 40 mg, PO, Daily, 2 refills    Latuda, 120 mg, PO, Daily    LORazepam 0.5 mg  oral tablet, 0.5 mg= 1 tab(s), PO, q6hr, PRN    OLANZapine 5 mg oral tablet, disintegrating, 5 mg= 1 tab(s), PO, Daily    oxacillin 2 g/50 mL intravenous solution, 2000 mg, IV, q4hr    oxyCODONE 5 mg oral tablet, 10 mg= 2 tab(s), PO, q6hr, PRN, Partial fill upon  request    Potassium Chloride Oral, 20 mEq, PO, BID    Protonix, 40 mg, PO, BID    Spiriva 18 mcg inhalation capsule, 18 mcg= 1 cap(s), INHAL, Daily    Vitamin B12 250 mcg oral tablet, 500 mcg, PO, Daily    Vitamin D3, 1000 Unit(s), PO, Daily    Diet    Regular Diet - Ordered    -- 11/10/17 16:10:00 EDT, Room Service, Scheduled / PRN    Lab Results    Glucose Lvl: 113 mg/dL High (40/98/11 91:47:82)    BUN: 6 mg/dL Low (95/62/13 08:65:78)    Creatinine: 1.05 mg/dL (46/96/29 52:84:13)    Afn Amer Glomerular Filtration Rate: 70 ml/min/1.40m2 (11/10/17 12:58:00)    Non-Afn Amer Glomerular Filtration Rate: 61 ml/min/1.59m2 (11/10/17 12:58:00)    Sodium Lvl: 144 mmol/L (11/10/17 20:18:00)    Potassium Lvl: 2.8 mmol/L Low (11/10/17 20:18:00)    Chloride: 109 mmol/L (11/10/17 20:18:00)    CO2: 26 mmol/L (11/10/17 20:18:00)    Anion Gap: 9 (11/10/17 20:18:00)    Total Protein: 5.5 Gm/dL Low (24/40/10 27:25:36)    Albumin Lvl: 1.4 Gm/dL Low (64/40/34 74:25:95)    Calcium Lvl: 7.3 mg/dL Low (63/87/56 43:32:95)    Magnesium Lvl: 1.5 mg/dL Low (18/84/16 60:63:01)    Bilirubin Total: 0.4 mg/dL (60/10/93 23:55:73)    Alkaline Phosphatase: 176 Units/L High (11/10/17 12:58:00)    AST: 24 Units/L (11/10/17 12:58:00)    ALT: 13 Units/L (11/10/17 12:58:00)    Iron: 57 mcg/dL (22/02/54 27:06:23)    TIBC: 51 mcg/dL Low (76/28/31 51:76:16)    % Iron Bound: >100 High (11/10/17 20:18:00)    Ferritin Lvl: 1324 nGm/ml High (11/10/17  20:18:00)    WBC: 8.3 thous/mm3 (11/10/17 12:58:00)    RBC: 2.2 Mil/mm3 Low (11/10/17 12:58:00)    Hgb: 7.4 Gm/dL Low (07/37/10 62:69:48)    Hct: 22.8 % Low (11/10/17 12:58:00)    Platelet: 333 thous/mm3 (11/10/17 12:58:00)    MCV: 103.6 fL High (11/10/17 12:58:00)    MCH: 33.6 pGm (11/10/17 12:58:00)    MCHC: 32.5 Gm/dL (54/62/70 35:00:93)    RDW-SD: 75.9 fL High (11/10/17 12:58:00)    MPV: 9 fL Low (11/10/17 12:58:00)    Absolute Neutro Count: 6.47 thous/mm3 (11/10/17 12:58:00)    Absolute Lymphs Count: 1.09 thous/mm3 (11/10/17 12:58:00)    Absolute Mono Count: 0.67 thous/mm3 (11/10/17 12:58:00)    Absolute Eos Count: 0.03 thous/mm3 (11/10/17 12:58:00)    Absolute Baso Count: 0.03 thous/mm3 (11/10/17 12:58:00)    Neutrophils: 77.6 % (11/10/17 12:58:00)    Lymphocytes: 13.1 % (11/10/17 12:58:00)    Monocytes: 8 % (11/10/17 12:58:00)    Eosinophils: 0.4 % (11/10/17 12:58:00)    Basophils: 0.4 % (11/10/17 12:58:00)    Immature Granulocytes: 0.5 % (11/10/17 12:58:00)    NRBC Percent: 0 % (11/10/17 12:58:00)    Absolute NRBC Count: 0 thous/mm3 (11/10/17 12:58:00)    Polychrom: 1+ Abnormal (11/10/17 12:58:00)    Anisocytosis: 1+ Abnormal (11/10/17 12:58:00)    Macrocytes: 1+ Abnormal (11/10/17 12:58:00)    Schistocytes: Occasional Abnormal (11/10/17 12:58:00)    Teardrops: 1+ Abnormal (11/10/17 12:58:00)    Pappenh Bodies: Occasional Abnormal (11/10/17 12:58:00)  Platelet Morph: Enlarged Abnormal (11/10/17 12:58:00)    ABO/Rh Automated: A POS (11/10/17 12:58:00)    Antibody Screen Automated: Negative (11/10/17 12:58:00)    PT: 11.8 sec High (11/10/17 12:58:00)    INR: 1.2 (11/10/17 12:58:00)    aPTT: 21 sec Low (11/10/17 12:58:00)    Diagnostic Results        ------        SIGNATURE LINE Electronically signed by Layla Barter MD, Greely Atiyeh on 11/11/2017 at  01:43:43 EST

## 2017-11-10 NOTE — Progress Notes (Signed)
Subjective    Feels well, more alert, no fevers, chills, rigors. No nausea, vomiting,  diarrhea. No chest pain or shortness of breath    Review of Systems    Negative apart from those in HPI    Objective    Vitals & Measurements    **T: **98.7 F  (Oral) **TMIN: **97.6 F  (Oral) **TMAX: **98.7 F  (Oral)  **HR: **93 **RR: **18 **BP: **99/62 **SpO2: **100% **O2 Method (L/min): **Room  air    Physical Exam    General: elderly lady, appears older than state age    HEENT: EOM intact, no oral lesions    Neck:Supple    CVS: regular rate and rhythm, S1S2+ no m/r/g    RS: Non-labored breathing, clear bilateral ant    Abd: Soft, non-tender, no hepatosplenomegaly    Ext: no edema [1]    Medications    _Inpatient_    acetaminophen 325 mg oral tablet, 650 mg= 2 tab(s), PO, q4hr, PRN    albuterol 2.5 mg/3 mL (0.083%) inhalation solution, 2.5 mg= 3 mL, NEB, q4hr,  PRN    amitriptyline, 100 mg= 2 tab(s), PO, HS    budesonide 0.5 mg/2 mL inhalation suspension, 0.5 mg= 2 mL, NEB, BID    ceFAZolin    clindamycin, 600 mg= 50 mL, IV, q8hr    Cymbalta, 60 mg= 2 cap(s), PO, Daily    docusate, 100 mg= 1 cap(s), PO, BID, PRN    folic acid, 1 mg= 1 tab(s), PO, Daily    formoterol 20 mcg/2 mL inhalation solution, 20 mcg= 2 mL, NEB, BID    glycopyrrolate 15.6 mcg inhalation capsule, 15.6 mcg= 1 cap(s), INHAL, BID    LORazepam, 0.5 mg= 1 tab(s), PO, q6hr, PRN    OLANZapine odt, 5 mg= 1 tab(s), PO, Daily    ondansetron, 4 mg= 2 mL, IV Push, q8hr, PRN    oxyCODONE, 10 mg= 2 tab(s), PO, q6hr, PRN    Potassium Chloride Oral, 40 mEq= 2 tab(s), PO, q4hr    Protonix, 40 mg= 1 tab(s), PO, BID    Vitamin B12, 500 mcg= 0.5 tab(s), PO, Daily    Lab Results    Blood Glucose POC: 145 mg/dL High (09/81/19 14:78:29)    Glucose Lvl: 97 mg/dL (56/21/30 86:57:84)    BUN: 6 mg/dL Low (69/62/95 28:41:32)    Creatinine: 0.721 mg/dL (44/01/02 72:53:66)    Afn Amer Glomerular Filtration Rate: >90 (11/13/17 06:00:00)    Non-Afn Amer Glomerular Filtration  Rate: 88 ml/min/1.12m2 (11/13/17 06:00:00)    Sodium Lvl: 145 mmol/L (11/13/17 06:00:00)    Potassium Lvl: 3.2 mmol/L Low (11/13/17 06:00:00)    Chloride: 112 mmol/L High (11/13/17 06:00:00)    CO2: 28 mmol/L (11/13/17 06:00:00)    Anion Gap: 5 (11/13/17 06:00:00)    Calcium Lvl: 7.1 mg/dL Low (44/03/47 42:59:56)    Magnesium Lvl: 1.8 mg/dL (38/75/64 33:29:51)    WBC: 4.8 thous/mm3 (11/13/17 08:56:00)    RBC: 1.99 Mil/mm3 Low (11/13/17 08:56:00)    Hgb: 6.6 Gm/dL Critical (88/41/66 06:30:16)    Hct: 21.5 % Low (11/13/17 08:56:00)    Platelet: 262 thous/mm3 (11/13/17 08:56:00)    MCV: 108 fL High (11/13/17 08:56:00)    MCH: 33.2 pGm (11/13/17 08:56:00)    MCHC: 30.7 Gm/dL Low (07/13/30 35:57:32)    RDW-SD: 78.9 fL High (11/13/17 08:56:00)    MPV: 9.1 fL Low (11/13/17 08:56:00)    NRBC Percent: 0 % (11/13/17 08:56:00)    Absolute NRBC Count: 0 thous/mm3 (11/13/17 08:56:00)  Diagnostic Results    Impression and Plan    67 year old female with chronic diastolic CHF, bipolar disorder, depression,  anxiety, chronic kidney disease II, essential hypertesion, COPD, GERD, chronic  low back pain, h/o morbid obesity s/p gastric bypass, choledocholithiasis with  MSSA osteomyelitis and epidural abscess and MSSA bacteremia admitted with  hypokalemia        1\. Hypokalemia: Urine spot potassium of 36mmol/dl and TTKG is 13, suggesting  renal wasting. Repleted with 120 meqPO and 40 meq IV yesterday, K slightly  better. Replete PO again today. If still low tomorrow consult renal.        2\. MSSA vertebral osteomyelitis: course extended after recent MRI showed  residual disease. tolerating cefazolin and clindamycin        3\. Anemia: likely 2/2 chronic disease - iron, b12, folate all normal        Code: full        Received call from sister asking for patient to be transferred to [place].  Called back 3 times to discuss, but no answer. Await call back from sister.                        [2]    [1] Progress/SOAP Note; Rennie Natter  11/12/2017 16:09 EDT    [2] Progress/SOAP Note; Rennie Natter 11/12/2017 16:09 EDT    SIGNATURE LINE Electronically signed by Lattie Haw MD, Ether Wolters on 11/13/2017 at  14:20:49 EST

## 2017-11-10 NOTE — Progress Notes (Signed)
Subjective    No overnight events. Continues to have hypokalemia. Remains confused. Denies  chest pain, shortness of breath, fevers, chills,r igors.    Review of Systems    Objective    Vitals & Measurements    **T: **97.6 F  (Oral) **TMIN: **97.6 F  (Oral) **TMAX: **98.6 F  (Oral)  **HR: **73 **RR: **17 **BP: **123/73 **SpO2: **100% **O2 Rate: **2 **O2 Method  (L/min): **Room air    Physical Exam    General: elderly lady, appears older than state age    HEENT: EOM intact, no oral lesions    Neck:Supple    CVS: regular rate and rhythm, S1S2+ no m/r/g    RS: Non-labored breathign, clear bilateral ant    Abd: Soft, non-tender, no hepatosplenomegaly    Ext: no edema    Medications    _Inpatient_    acetaminophen 325 mg oral tablet, 650 mg= 2 tab(s), PO, q4hr, PRN    albuterol 2.5 mg/3 mL (0.083%) inhalation solution, 2.5 mg= 3 mL, NEB, q4hr,  PRN    amitriptyline, 100 mg= 2 tab(s), PO, HS    budesonide 0.5 mg/2 mL inhalation suspension, 0.5 mg= 2 mL, NEB, BID    ceFAZolin    clindamycin, 600 mg= 50 mL, IV, q8hr    Cymbalta, 60 mg= 2 cap(s), PO, Daily    docusate, 100 mg= 1 cap(s), PO, BID, PRN    folic acid, 1 mg= 1 tab(s), PO, Daily    formoterol 20 mcg/2 mL inhalation solution, 20 mcg= 2 mL, NEB, BID    glycopyrrolate 15.6 mcg inhalation capsule, 15.6 mcg= 1 cap(s), INHAL, BID    LORazepam, 0.5 mg= 1 tab(s), PO, q6hr, PRN    OLANZapine odt, 5 mg= 1 tab(s), PO, Daily    ondansetron, 4 mg= 2 mL, IV Push, q8hr, PRN    oxyCODONE, 10 mg= 2 tab(s), PO, q6hr, PRN    Potassium Chloride Oral, 40 mEq= 2 tab(s), PO, q4hr    Protonix, 40 mg= 1 tab(s), PO, BID    Vitamin B12, 500 mcg= 0.5 tab(s), PO, Daily    Lab Results    Blood Glucose POC: 168 mg/dL High (16/10/96 04:54:09)    Glucose Lvl: 95 mg/dL (81/19/14 78:29:56)    BUN: 7 mg/dL Low (21/30/86 57:84:69)    Creatinine: 0.714 mg/dL (62/95/28 41:32:44)    Afn Amer Glomerular Filtration Rate: >90 (11/12/17 05:52:00)    Non-Afn Amer Glomerular Filtration Rate: 89  ml/min/1.86m2 (11/12/17 05:52:00)    Sodium Lvl: 143 mmol/L (11/12/17 05:52:00)    Potassium Lvl: 2.7 mmol/L Critical (11/12/17 05:52:00)    Chloride: 110 mmol/L (11/12/17 05:52:00)    CO2: 29 mmol/L (11/12/17 05:52:00)    Anion Gap: 4 (11/12/17 05:52:00)    Calcium Lvl: 7 mg/dL Low (07/06/70 53:66:44)    Magnesium Lvl: 1.8 mg/dL (03/47/42 59:56:38)    Osmolality: 286 mOsm/Kg (11/12/17 05:52:00)    Iron: 51 mcg/dL (75/64/33 29:51:88)    TIBC: 35 mcg/dL Low (41/66/06 30:16:01)    % Iron Bound: >100 High (11/12/17 05:52:00)    Ferritin Lvl: 1101 nGm/ml High (11/12/17 05:52:00)    U Creat: 73 mg/dL (09/32/35 57:32:20)    U Osmolality: 384 mOsm/Kg (11/12/17 13:12:00)    U Potassium: 47.3 mmol/L (11/12/17 13:12:00)    U Sodium: 46 mmol/L (11/12/17 13:12:00)    WBC: 5.2 thous/mm3 (11/12/17 05:52:00)    RBC: 2.01 Mil/mm3 Low (11/12/17 05:52:00)    Hgb: 6.9 Gm/dL Critical (25/42/70 62:37:62)    Hct: 21.4 % Low (11/12/17  05:52:00)    Platelet: 250 thous/mm3 (11/12/17 05:52:00)    MCV: 106.5 fL High (11/12/17 05:52:00)    MCH: 34.3 pGm High (11/12/17 05:52:00)    MCHC: 32.2 Gm/dL (41/32/44 01:02:72)    RDW-SD: 78.3 fL High (11/12/17 05:52:00)    MPV: 9.1 fL Low (11/12/17 05:52:00)    NRBC Percent: 0 % (11/12/17 05:52:00)    Absolute NRBC Count: 0 thous/mm3 (11/12/17 05:52:00)    Retic Count Percent: 6.8 % (11/12/17 05:52:00)    Absolute Retic Count: 0.1356 Mil/mm3 High (11/12/17 05:52:00)    Diagnostic Results    Impression and Plan    67 year old female with chronic diastolic CHF, bipolar disorder, depression,  anxiety, chronic kidney disease II, essential hypertesion, COPD, GERD, chronic  low back pain, h/o morbid obesity s/p gastric bypass, choledocholithiasis with  MSSA osteomyelitis and epidural abscess and MSSA bacteremia admitted with  hypokalemia        1\. Hypokalemia: Urine spot potassium of 49mmol/dl and TTKG is 13, suggesting  renal wasting. Patient is on oxacillin, switched to cefazolin today. Replete  to  IV and 120 meq PO today. Recheck K int he AM. If patient continues ot  have hypokalemia tomorrow may need to reassess for other causes inculding  using ABG        2\. MSSA vertebral osteomyelitis: course extended after recent MRI showed  residual disease. Switched to cefazolin today, clindamycin continued        Code: full        [1]    [1] Progress/SOAP Note; Miniati MD, Gordy Councilman 11/11/2017 13:23 EDT    SIGNATURE LINE Electronically signed by Lattie Haw MD, Aster Screws on 11/12/2017 at  16:44:04 EST

## 2017-11-10 NOTE — Progress Notes (Signed)
Subjective    Doing well, no complaints. Single temperature yesterday. Occasional cough,  non-productive. No chills or rigors. No rash. Breathing at baseline. MRI done  today shows no new collection    Review of Systems    Negative apart from those in HPI    Objective    Vitals & Measurements    **T: **98.2 F  (Oral) **TMIN: **98.2 F  (Oral) **TMAX: **100.1 F  (Oral)  **HR: **100 **RR: **21 **BP: **125/77 **SpO2: **96% **O2 Method (L/min):  **Room air    Physical Exam    General: elderly lady, appears older than state age    HEENT: EOM intact, no oral lesions    Neck:Supple    CVS: regular rate and rhythm, S1S2+ no m/r/g    RS: Non-labored breathing, clear bilateral ant    Abd: Soft, non-tender, no hepatosplenomegaly    Ext: Bilateral pedal edema    MSK: focal tenderness over lumbar spine    PICC line c/d/oi no eryhtema or tenderness    [1]    Medications    _Inpatient_    acetaminophen 325 mg oral tablet, 650 mg= 2 tab(s), PO, q4hr, PRN    albuterol 2.5 mg/3 mL (0.083%) inhalation solution, 2.5 mg= 3 mL, NEB, q4hr,  PRN    amitriptyline, 100 mg= 2 tab(s), PO, HS    bacitracin 500 units/g topical ointment, 1 app, TOP, ud, PRN    budesonide 0.5 mg/2 mL inhalation suspension, 0.5 mg= 2 mL, NEB, BID    ceFAZolin    clindamycin, 600 mg= 50 mL, IV, q8hr    Cymbalta, 60 mg= 2 cap(s), PO, Daily    docusate, 100 mg= 1 cap(s), PO, BID, PRN    folic acid, 1 mg= 1 tab(s), PO, Daily    formoterol 20 mcg/2 mL inhalation solution, 20 mcg= 2 mL, NEB, BID    glycopyrrolate 15.6 mcg inhalation capsule, 15.6 mcg= 1 cap(s), INHAL, BID    LORazepam, 0.5 mg= 1 tab(s), PO, q6hr, PRN    Maalox Antacid [place] Regular Strength oral suspension, 30 mL, PO, q6hr, PRN    OLANZapine odt, 5 mg= 1 tab(s), PO, Daily    ondansetron, 4 mg= 2 mL, IV Push, q8hr, PRN    oxyCODONE, 10 mg= 2 tab(s), PO, q6hr, PRN    Protonix, 40 mg= 1 tab(s), PO, BID    Sodium Chloride 0.9% Flush, 10 mL, Flush, ud, PRN    Sodium Chloride 0.9% Flush, 10 mL,  Flush, q12hr    Tums, 500 mg= 1 tab(s), PO, TID, PRN    Vitamin B12, 500 mcg= 0.5 tab(s), PO, Daily    Lab Results    Glucose Lvl: 85 mg/dL (32/35/57 32:20:25)    BUN: 6 mg/dL Low (42/70/62 37:62:83)    Creatinine: 0.574 mg/dL (15/17/61 60:73:71)    Afn Amer Glomerular Filtration Rate: >90 (11/15/17 06:07:00)    Non-Afn Amer Glomerular Filtration Rate: >90 (11/15/17 06:07:00)    Sodium Lvl: 142 mmol/L (11/15/17 06:07:00)    Potassium Lvl: 4.1 mmol/L (11/15/17 06:07:00)    Potassium Lvl: 4.1 mmol/L (11/15/17 06:07:00)    Chloride: 111 mmol/L High (11/15/17 06:07:00)    CO2: 25 mmol/L (11/15/17 06:07:00)    Anion Gap: 6 (11/15/17 06:07:00)    Calcium Lvl: 7.7 mg/dL Low (12/28/92 85:46:27)    Magnesium Lvl: 2 mg/dL (03/50/09 38:18:29)    UA Color: Light Yellow1 (11/15/17 06:07:00)    UA Clarity: Clear1 (11/15/17 06:07:00)    UA Specific Gravity: 1.012 (11/15/17 06:07:00)  UA pH: 7 (11/15/17 06:07:00)    UA Protein: Negative1 (11/15/17 06:07:00)    UA Glucose: Negative1 (11/15/17 06:07:00)    UA Ketones: Negative1 (11/15/17 06:07:00)    UA Bilirubin: Negative1 (11/15/17 06:07:00)    UA Blood: Negative1 (11/15/17 06:07:00)    UA Urobilinogen: Negative1 (11/15/17 06:07:00)    UA Nitrite: Negative1 (11/15/17 06:07:00)    UA Leukocyte Esterase: Negative1 (11/15/17 06:07:00)    UA RBC: 1 /HPF (11/15/17 06:07:00)    UA WBC: 1 /HPF (11/15/17 06:07:00)    UA Squamous Epithelial: 1 /HPF (11/15/17 06:07:00)    UA Bacteria: None1 (11/15/17 06:07:00)    UA Budding Yeast: 4+ Abnormal (11/15/17 06:07:00)    Diagnostic Results        IMPRESSION:        Overall slight interval improvement in the appearance of infectious    spondylodiscitis at L1-2 with resolution of small paraspinal abscesses and    ventral epidural microabscesses. Residual ventral epidural    thickening/enhancement/phlegmon at L2-3 as before with residual paraspinal    inflammatory changes. Pre-existing multilevel degenerative findings as    previously detailed.     ____________________________________________ [2]    Impression and Plan    67 year old female with chronic diastolic CHF, bipolar disorder, depression,  anxiety, chronic kidney disease II, essential hypertension, COPD, GERD,  chronic low back pain, h/o morbid obesity s/p gastric bypass,  choledocholithiasis with MSSA osteomyelitis and epidural abscess and MSSA  bacteremia admitted with hypokalemia        1\. Hypokalemia: Patient likely has potassium wasting 2/2 oxacillin. Has been  repleted PO - was 4.8 yesterday evening and 4.1 today. Plan repeat Potassium  level today at ~1700 to determine further care. Received 80 meq PO yesterday.        2\. MSSA vertebral osteomyelitis: course extended after recent MRI showed  residual disease. Tolerating cefazolin and clindamycin. repeat MRI shows  improved with no new pathology. Will need another 2 weeks of antibiotic  therapy.        3\. Anemia: likely 2/2 chronic disease - iron, b12, folate all normal.  Transfused 1 unit of blood with appropriate Hb response.        4\. Fevers: No clear cause - blood cultures negative, urinalysis normal. No  features s/o pneumonia. Single temp last night.        Code: Full [3]    [1] Progress/SOAP Note; Rennie Natter 11/14/2017 08:25 EDT    [2] MRI Spine Lumbosacral C-/C+; Gery Pray MD, Judie Bonus 11/15/2017 08:53 EDT    [3] Progress/SOAP Note; Rennie Natter 11/14/2017 08:25 EDT    SIGNATURE LINE Electronically signed by Lattie Haw MD, Kaly Mcquary on 11/15/2017 at  13:38:22 EST

## 2017-11-10 NOTE — Progress Notes (Signed)
Subjective    Pt seen and examined and chart reviewed.    Pt is asked "why she came to hospital", she states "I do not know".    A+O x2, comfortable.    When asked if she has diarrhea, she states, I do not know.    D/w nursing staff.    Objective    Vitals & Measurements    **T: **98.3 F  (Oral) **TMIN: **98.3 F  (Oral) **TMAX: **98.6 F  (Oral)  **HR: **93 **RR: **19 **BP: **129/74 **SpO2: **100% **O2 Rate: **2 **O2 Method  (L/min): **Nasal cannula **WT: **75 Kg    Physical Exam    General:  Alert, no acute distress.    Skin:  Warm, dry, pink, intact.    Head:  Normocephalic, atraumatic.    Neck:  Supple, trachea midline, no tenderness.    Cardiovascular:  Regular rate and rhythm, No murmur, Normal peripheral  perfusion, No edema.    Respiratory:  Lungs are clear to auscultation, respirations are non-labored,  breath sounds are equal, Symmetrical chest wall expansion.    Gastrointestinal:  Soft, Nontender, Non distended, Normal bowel sounds.    Back:  Nontender, Normal range of motion, Normal alignment, no step-offs.    Musculoskeletal:  Normal ROM, normal strength, no tenderness.    Psychiatric:  Cooperative, appropriate mood & affect.      Medications    _Inpatient_    acetaminophen 325 mg oral tablet, 650 mg= 2 tab(s), PO, q4hr, PRN    albuterol 2.5 mg/3 mL (0.083%) inhalation solution, 2.5 mg= 3 mL, NEB, q4hr,  PRN    amitriptyline, 100 mg= 2 tab(s), PO, HS    budesonide 0.5 mg/2 mL inhalation suspension, 0.5 mg= 2 mL, NEB, BID    clindamycin, 600 mg= 50 mL, IV, q8hr    Cymbalta, 60 mg= 2 cap(s), PO, Daily    docusate, 100 mg= 1 cap(s), PO, BID, PRN    folic acid, 1 mg= 1 tab(s), PO, Daily    formoterol 20 mcg/2 mL inhalation solution, 20 mcg= 2 mL, NEB, BID    glycopyrrolate 15.6 mcg inhalation capsule, 15.6 mcg= 1 cap(s), INHAL, BID    LORazepam, 0.5 mg= 1 tab(s), PO, q6hr, PRN    OLANZapine odt, 5 mg= 1 tab(s), PO, Daily    ondansetron, 4 mg= 2 mL, IV Push, q8hr, PRN    oxacillin    oxyCODONE, 10  mg= 2 tab(s), PO, q6hr, PRN    Potassium Chloride Oral, 40 mEq= 30 mL, PO, q4hr    Protonix, 40 mg= 1 tab(s), PO, BID    Vitamin B12, 500 mcg= 0.5 tab(s), PO, Daily    Lab Results    Blood Glucose POC: 100 mg/dL (40/98/11 91:47:82)    Glucose Lvl: 93 mg/dL (95/62/13 08:65:78)    BUN: 7 mg/dL Low (46/96/29 52:84:13)    Creatinine: 0.884 mg/dL (24/40/10 27:25:36)    Afn Amer Glomerular Filtration Rate: 79 ml/min/1.85m2 (11/11/17 08:33:00)    Non-Afn Amer Glomerular Filtration Rate: 69 ml/min/1.70m2 (11/11/17 08:33:00)    Sodium Lvl: 142 mmol/L (11/11/17 08:33:00)    Potassium Lvl: 2.6 mmol/L Critical (11/11/17 08:33:00)    Chloride: 108 mmol/L (11/11/17 08:33:00)    CO2: 27 mmol/L (11/11/17 08:33:00)    Anion Gap: 7 (11/11/17 08:33:00)    Calcium Lvl: 7.2 mg/dL Low (64/40/34 74:25:95)    Magnesium Lvl: 2.1 mg/dL (63/87/56 43:32:95)    Iron: 57 mcg/dL (18/84/16 60:63:01)    TIBC: 51 mcg/dL Low (60/10/93 23:55:73)    %  Iron Bound: >100 High (11/10/17 20:18:00)    Ferritin Lvl: 1324 nGm/ml High (11/10/17 20:18:00)    WBC: 4.5 thous/mm3 (11/11/17 08:33:00)    RBC: 2.13 Mil/mm3 Low (11/11/17 08:33:00)    Hgb: 7.2 Gm/dL Low (02/72/53 66:44:03)    Hct: 22.4 % Low (11/11/17 08:33:00)    Platelet: 298 thous/mm3 (11/11/17 08:33:00)    MCV: 105.2 fL High (11/11/17 08:33:00)    MCH: 33.8 pGm (11/11/17 08:33:00)    MCHC: 32.1 Gm/dL (47/42/59 56:38:75)    RDW-SD: 77.9 fL High (11/11/17 08:33:00)    MPV: 9 fL Low (11/11/17 08:33:00)    Absolute Neutro Count: 2.91 thous/mm3 (11/11/17 08:33:00)    Absolute Lymphs Count: 1.03 thous/mm3 (11/11/17 08:33:00)    Absolute Mono Count: 0.47 thous/mm3 (11/11/17 08:33:00)    Absolute Eos Count: 0.07 thous/mm3 (11/11/17 08:33:00)    Absolute Baso Count: 0.02 thous/mm3 (11/11/17 08:33:00)    Neutrophils: 64.5 % (11/11/17 08:33:00)    Lymphocytes: 22.8 % (11/11/17 08:33:00)    Monocytes: 10.4 % (11/11/17 08:33:00)    Eosinophils: 1.5 % (11/11/17 08:33:00)    Basophils: 0.4 % (11/11/17 08:33:00)     Immature Granulocytes: 0.4 % (11/11/17 08:33:00)    NRBC Percent: 0 % (11/11/17 08:33:00)    Absolute NRBC Count: 0 thous/mm3 (11/11/17 08:33:00)    Polychrom: 1+ Abnormal (11/11/17 08:33:00)    Anisocytosis: 1+ Abnormal (11/11/17 08:33:00)    Macrocytes: 1+ Abnormal (11/11/17 08:33:00)    Blue Top Tube To Hold: DONE (11/11/17 08:33:00)    Diagnostic Results    Impression and Plan    67 year old lady from [facility] , presents today for evaluation for anemia  and abnormal labs. Patient has a history of bipolar disorder, anxiety and  depression, has been evaluated by psychiatry multiple times in the past. PMH:  Neuropathy, COPD, anxiety, bipolar disorder, MSSA bacteremia and spinal  osteomyelitis, chronic pain. Recently here treated for acute on chronic back  pain/ MSSA osteomyelitis and LLE cellulitis.        # Critical hypokalemia: unclear etiology, pt not on any causative meds, she  denies diarrhea. The latter to be confirmed with [facility]. Will replete  aggressively both IV and po and re-check level this afternoon. Adrenal  etiologies to be considered, however, no clinical indication based on BP/ rest  of clinical picture.    # Chronic IDA: stool guaiac. If positive, will have GI to evaluate her. No  need for blood transfusion at this time.    # Bipolar disorder, on Zyprexa.    #Neuropathy on amitriptyline    #COPD - chronic w/o acute exacerbation: on inhalers.    #Anxiety disorder    #MSSA bacteremia spinal osteomyelitis on Clinda and oxacillin per ID.  Investigating antibiotic-induced diarrhea.    #Chronic pain on oxycodone.        C/w tele.    SIGNATURE LINE Electronically signed by Bartolo Montanye MD (HOSP), [place] on  11/11/2017 at 13:35:27 EST

## 2017-11-10 NOTE — Discharge Summary (Signed)
Date of Admission    11/10/17    Date of Discharge    11/16/17    Admission History    67 year old lady from [facility] , presents today for evaluation for anemia.  Patient has a history of bipolar disorder, anxiety and depression, has been  evaluated by psychiatry multiple times in the past states she wants to go home  and claims she is being abducted by the Hospital. History taken is therefore  very limited. She has a PICC line in place for treatment of spinal  osteomyelitis and MSSA bacteremia. Her potassium magnesium were also found to  be low and these were replaced by ER physician however still low magnesium at  1.5 potassium is 2.8. Her H&H are also low 7.4/22.8, down from 9.1/28.5, 3  days ago. No evidence of active bleeding however physical examination is  limited due to patient behavior, received Zyprexa. Stable vitals. Patient had  an endoscopy in 2018 that showed ulcer in the distal esophagus however repeat  endoscopy 3 months after showed no evidence of active bleeding. No recent  colonoscopy. [1]    Code Status    Code Status - Ordered    -- 11/10/17 21:02:00 EDT, Full Resuscitation, Constant Order    Allergies    NKA    Social History    _Alcohol_    Current, 1-2 times per week    Past    _Substance Abuse_    Never    _Tobacco_    Former smoker, Cigarettes    Hospital Course    67 year old female with chronic diastolic CHF, bipolar disorder, depression,  anxiety, chronic kidney disease II, essential hypertension, COPD, GERD,  chronic low back pain, h/o morbid obesity s/p gastric bypass,  choledocholithiasis with MSSA osteomyelitis and epidural abscess and MSSA  bacteremia admitted with hypokalemia        1\. Hypokalemia: Admitted with potassium of 1.9. Patient likely has potassium  wasting 2/2 oxacillin and furosemide based on urine studies. Required IV and  PO repletion during hospital stay. Stopped oxacillin and switched patient to  cefazolin. Hypokalemia significantly improved with cessation.  Patient likely  whole body potassium depleted and will need continued repletion. Recommend  checking daily potassium and decreasing frequency of oral potassium chloride  from TID to less frequently as long as patients potassium remains above 4.        2\. MSSA vertebral osteomyelitis: Has been on therapy since early march with  course extended after recent MRI in April showed residual disease. Stopped  oxacillin as above. Tolerating cefazolin and clindamycin (both IV). Repeat MRI  this admission shows improvement in infection with no new pathology. Will need  another 2 weeks of antibiotic therapy as per my discussion with Dr Vickki Muff.  Should be seen by Dr Vickki Muff as an outpatient in clinic in 2 weeks time. Send  Weekly CBC with Diff, BUN/Cr, LFTs and ESR/CRP to Dr Vickki Muff at [phone]        3\. Anemia: likely 2/2 chronic disease - iron, b12, folate all normal.  Transfused 1 unit of blood 11/13/17 with appropriate Hb response.        4\. Fevers: No clear cause - blood cultures negative, urinalysis normal. No  features s/o pneumonia. Single temp last night. MRI shows improved vertebra  ostemtelitis        5\. Chronic diastolic heart failure - no features s/o exacerbation, resume PO  furosemide on discharge        Code: Full [2]  Procedures and Treatment Provided    Blood transfusion on 11/13/17    Physical Exam    Vitals & Measurements    **T: **98.6 F  (Oral) **TMIN: **97.0 F  (Axillary) **TMAX: **100.5 F  (Oral) **HR: **98 **RR: **35 **BP: **128/79 **SpO2: **97% **O2 Method (L/min):  **Room air **WT: **78.9 Kg    General: elderly lady, appears older than state age    HEENT: EOM intact, no oral lesions    Neck:Supple    CVS: regular rate and rhythm, S1S2+ no m/r/g    RS: Non-labored breathing, clear bilateral ant    Abd: Soft, non-tender, no hepatosplenomegaly    Ext: Bilateral pedal edema    MSK: focal tenderness over lumbar spine    PICC line c/d/oi no erythema or tenderness [3]    Lab Results    Glucose  Lvl: 78 mg/dL (51/88/41 66:06:30)    BUN: 7 mg/dL Low (16/01/09 32:35:57)    Creatinine: 0.616 mg/dL (32/20/25 42:70:62)    Afn Amer Glomerular Filtration Rate: >90 (11/16/17 05:48:00)    Non-Afn Amer Glomerular Filtration Rate: >90 (11/16/17 05:48:00)    Sodium Lvl: 143 mmol/L (11/16/17 05:48:00)    Potassium Lvl: 3.6 mmol/L (11/16/17 05:48:00)    Chloride: 111 mmol/L High (11/16/17 05:48:00)    CO2: 26 mmol/L (11/16/17 05:48:00)    Anion Gap: 6 (11/16/17 05:48:00)    Calcium Lvl: 7.5 mg/dL Low (37/62/83 15:17:61)    Osmolality: 291 mOsm/Kg (11/16/17 05:48:00)    U Creat: 81 mg/dL (60/73/71 06:26:94)    U Osmolality: 491 mOsm/Kg (11/16/17 09:36:00)    U Potassium: 65.1 mmol/L (11/16/17 09:36:00)    WBC: 4.7 thous/mm3 (11/16/17 05:48:00)    RBC: 2.27 Mil/mm3 Low (11/16/17 05:48:00)    Hgb: 8.9 Gm/dL Low (85/46/27 03:50:09)    Hct: 28.1 % Low (11/16/17 13:27:00)    Platelet: 276 thous/mm3 (11/16/17 05:48:00)    MCV: 105.3 fL High (11/16/17 05:48:00)    MCH: 33 pGm (11/16/17 05:48:00)    MCHC: 31.4 Gm/dL (38/18/29 93:71:69)    RDW-SD: 82.2 fL High (11/16/17 05:48:00)    MPV: 8.9 fL Low (11/16/17 05:48:00)    Absolute Neutro Count: 3.1 thous/mm3 (11/16/17 05:48:00)    Absolute Lymphs Count: 0.86 thous/mm3 (11/16/17 05:48:00)    Absolute Mono Count: 0.62 thous/mm3 (11/16/17 05:48:00)    Absolute Eos Count: 0.06 thous/mm3 (11/16/17 05:48:00)    Absolute Baso Count: 0.01 thous/mm3 (11/16/17 05:48:00)    Neutrophils: 66.3 % (11/16/17 05:48:00)    Lymphocytes: 18.4 % (11/16/17 05:48:00)    Monocytes: 13.2 % (11/16/17 05:48:00)    Eosinophils: 1.3 % (11/16/17 05:48:00)    Basophils: 0.2 % (11/16/17 05:48:00)    Immature Granulocytes: 0.6 % (11/16/17 05:48:00)    NRBC Percent: 0 % (11/16/17 05:48:00)    Absolute NRBC Count: 0 thous/mm3 (11/16/17 05:48:00)    Polychrom: 1+ Abnormal (11/16/17 05:48:00)    Anisocytosis: 1+ Abnormal (11/16/17 05:48:00)    Macrocytes: 1+ Abnormal (11/16/17 05:48:00)    Schistocytes: Occasional  Abnormal (11/16/17 05:48:00)    Discharge Diagnoses    Hypokalemia    Anemia    MSSA vertebral osteomyelitis        Secondary diagnoses    Anemia    Anxiety    Asthma    Bipolar 1 disorder    CHF - Congestive heart failure    CKD - chronic kidney disease    COPD - Chronic obstructive pulmonary disease    Depression    GERD    Hypertension  Neuropathy [4]    Discharge Medications    _Discharge_    acetaminophen 325 mg oral tablet, 650 mg, PO, q4hr, PRN    albuterol 2.5 mg/3 mL (0.083%) inhalation solution, 2.5 mg= 3 mL, NEB, q4hr,  PRN    amitriptyline, 100 mg, PO, HS    Breo Ellipta 100 mcg-25 mcg/inh inhalation powder, 1 puff(s), INHAL, Daily    ceFAZolin 2 g/100 mL-D5% injectable solution, See Instructions, 2 Gm IV q8hr  for 2 more weeks    clindamycin 600 mg/50 mL-D5% intravenous solution, 600 mg, IV, q8hr    Culturelle Digestive Health oral capsule, 1 cap(s), PO, BID    Cymbalta 60 mg oral delayed release capsule, 60 mg= 1 cap(s), PO, Daily    docusate sodium 100 mg oral capsule, 100 mg= 1 cap(s), PO, BID, PRN, 2 refills    folic acid 1 mg oral tablet, 1 mg= 1 tab(s), PO, Daily    Lasix 40 mg oral tablet, 40 mg, PO, Daily, 2 refills    Latuda, 120 mg, PO, Daily    OLANZapine 5 mg oral tablet, disintegrating, 5 mg= 1 tab(s), PO, Daily    potassium chloride 20 mEq/15 mL oral liquid, 40 mEq= 30 mL, PO, TID    Protonix, 40 mg, PO, BID    Spiriva 18 mcg inhalation capsule, 18 mcg= 1 cap(s), INHAL, Daily    Vitamin B12 250 mcg oral tablet, 500 mcg, PO, Daily    Vitamin D3, 1000 Unit(s), PO, Daily    Discharge Instructions    Follow up with your Primary Care Physician in 1 week    Follow up with infectious diseases in 2 weeks time.    Check CBC and BUN/Cr and LFTs  in  1 week    Counseling    Patient was counselled regarding her active conditions and plan of care    Face to Face    I interviewed and examined this patient and spent 45 minutes in planning and  coordinating discharge. I explained the plan of care and  follow-up to the  patient. Patient verbalized understanding. I offered the opportunity to  clarify and answer questions.    [1] Admission H & Corliss Blacker MD, Haitem 11/10/2017 22:00 EDT    [2] Progress/SOAP Note; Rennie Natter 11/15/2017 09:57 EDT    [3] Progress/SOAP Note; Rennie Natter 11/15/2017 09:57 EDT    [4] Admission H & Corliss Blacker MD, Haitem 11/10/2017 22:00 EDT    SIGNATURE LINE Electronically signed by Lattie Haw MD, Zinedine Ellner on 11/16/2017 at  14:13:53 EST

## 2017-11-10 NOTE — Progress Notes (Signed)
Updated patients sister: Ms Dorthula Rue.    [phone]          SIGNATURE LINE Electronically signed by Lattie Haw MD, Levar Fayson on 11/13/2017 at  14:20:49 EST

## 2017-11-11 LAB — HX .AUTOMATED DIFF
CASE NUMBER: 2019130000352
HX ABSOLUTE BASO COUNT: 0.02 10*3/uL — NL (ref 0.0–0.22)
HX ABSOLUTE EOS COUNT: 0.07 10*3/uL — NL (ref 0.0–0.45)
HX ABSOLUTE LYMPHS COUNT: 1.03 10*3/uL — NL (ref 0.74–5.04)
HX ABSOLUTE MONO COUNT: 0.47 10*3/uL — NL (ref 0.0–1.34)
HX ABSOLUTE NEUTRO COUNT: 2.91 10*3/uL — NL (ref 1.48–7.95)
HX BASOPHILS: 0.4 %
HX EOSINOPHILS: 1.5 %
HX IMMATURE GRANULOCYTES: 0.4 % — NL (ref 0.0–2.0)
HX LYMPHOCYTES: 22.8 %
HX MONOCYTES: 10.4 %
HX NEUTROPHILS: 64.5 %

## 2017-11-11 LAB — HX CBC W/ DIFF
CASE NUMBER: 2019130000352
HX ABSOLUTE NRBC COUNT: 0 10*3/uL
HX HCT: 22.4 % — ABNORMAL LOW (ref 36.0–47.0)
HX HGB: 7.2 g/dL — ABNORMAL LOW (ref 11.8–16.0)
HX MCH: 33.8 pg — NL (ref 26.0–34.0)
HX MCHC: 32.1 g/dL — NL (ref 31.0–37.0)
HX MCV: 105.2 fL — ABNORMAL HIGH (ref 80.0–100.0)
HX MPV: 9 fL — ABNORMAL LOW (ref 9.4–12.4)
HX NRBC PERCENT: 0 % — NL
HX PLATELET: 298 10*3/uL — NL (ref 150.0–400.0)
HX RBC: 2.13 10*6/uL — ABNORMAL LOW (ref 3.9–5.2)
HX RDW-CV: 20.5 % — ABNORMAL HIGH (ref 11.5–14.5)
HX RDW-SD: 77.9 fL — ABNORMAL HIGH (ref 35.0–51.0)
HX WBC: 4.5 10*3/uL — NL (ref 3.7–11.2)

## 2017-11-11 LAB — HX BLUE TOP TO HOLD
CASE NUMBER: 2019130000352
CASE NUMBER: 2019130002324

## 2017-11-11 LAB — HX .RBC MORPHOLOGY: CASE NUMBER: 2019130000352

## 2017-11-11 LAB — HX LAVENDER TOP TO HOLD: CASE NUMBER: 2019130002324

## 2017-11-11 LAB — HX GLOMERULAR FILTRATION RATE (ESTIMATED)
CASE NUMBER: 2019130000352
HX AFN AMER GLOMERULAR FILTRATION RATE: 79 mL/min/{1.73_m2}
HX NON-AFN AMER GLOMERULAR FILTRATION RATE: 69 mL/min/{1.73_m2}

## 2017-11-11 LAB — HX BASIC METABOLIC PANEL
CASE NUMBER: 2019130000352
HX ANION GAP: 7 — NL (ref 3.0–11.0)
HX BUN: 7 mg/dL — ABNORMAL LOW (ref 8.0–23.0)
HX CALCIUM LVL: 7.2 mg/dL — ABNORMAL LOW (ref 8.5–10.5)
HX CHLORIDE: 108 mmol/L — NL (ref 98.0–110.0)
HX CO2: 27 mmol/L — NL (ref 21.0–32.0)
HX CREATININE: 0.884 mg/dL — NL (ref 0.55–1.3)
HX GLUCOSE LVL: 93 mg/dL — NL (ref 70.0–110.0)
HX POTASSIUM LVL: 2.6 mmol/L — CR (ref 3.6–5.2)
HX SODIUM LVL: 142 mmol/L — NL (ref 136.0–146.0)

## 2017-11-11 LAB — HX MAGNESIUM LEVEL
CASE NUMBER: 2019130000352
HX MAGNESIUM LVL: 2.1 mg/dL — NL (ref 1.7–2.5)

## 2017-11-11 LAB — HX POTASSIUM LEVEL
CASE NUMBER: 2019130002324
HX POTASSIUM LVL: 3.3 mmol/L — ABNORMAL LOW (ref 3.6–5.2)

## 2017-11-12 LAB — HX BASIC METABOLIC PANEL
CASE NUMBER: 2019131000187
HX ANION GAP: 4 — NL (ref 3.0–11.0)
HX BUN: 7 mg/dL — ABNORMAL LOW (ref 8.0–23.0)
HX CALCIUM LVL: 7 mg/dL — ABNORMAL LOW (ref 8.5–10.5)
HX CHLORIDE: 110 mmol/L — NL (ref 98.0–110.0)
HX CO2: 29 mmol/L — NL (ref 21.0–32.0)
HX CREATININE: 0.714 mg/dL — NL (ref 0.55–1.3)
HX GLUCOSE LVL: 95 mg/dL — NL (ref 70.0–110.0)
HX POTASSIUM LVL: 2.7 mmol/L — CR (ref 3.6–5.2)
HX SODIUM LVL: 143 mmol/L — NL (ref 136.0–146.0)

## 2017-11-12 LAB — HX OSMOLALITY
CASE NUMBER: 2019131000187
HX OSMOLALITY: 286 mosm/kg — NL (ref 275.0–300.0)

## 2017-11-12 LAB — HX CBC W/ INDICES
CASE NUMBER: 2019131000187
HX ABSOLUTE NRBC COUNT: 0 10*3/uL
HX HCT: 21.4 % — ABNORMAL LOW (ref 36.0–47.0)
HX HGB: 6.9 g/dL — CR (ref 11.8–16.0)
HX MCH: 34.3 pg — ABNORMAL HIGH (ref 26.0–34.0)
HX MCHC: 32.2 g/dL — NL (ref 31.0–37.0)
HX MCV: 106.5 fL — ABNORMAL HIGH (ref 80.0–100.0)
HX MPV: 9.1 fL — ABNORMAL LOW (ref 9.4–12.4)
HX NRBC PERCENT: 0 % — NL
HX PLATELET: 250 10*3/uL — NL (ref 150.0–400.0)
HX RBC: 2.01 10*6/uL — ABNORMAL LOW (ref 3.9–5.2)
HX RDW-CV: 20 % — ABNORMAL HIGH (ref 11.5–14.5)
HX RDW-SD: 78.3 fL — ABNORMAL HIGH (ref 35.0–51.0)
HX WBC: 5.2 10*3/uL — NL (ref 3.7–11.2)

## 2017-11-12 LAB — HX IRON PROFILE
CASE NUMBER: 17949108
HX % IRON BOUND: >100 — ABNORMAL HIGH (ref 10.0–50.0)
HX FERRITIN LVL: 1101 ng/mL — ABNORMAL HIGH (ref 11.0–307.0)
HX IRON: 51 ug/dL — NL (ref 30.0–160.0)
HX TIBC: 35 ug/dL — ABNORMAL LOW (ref 250.0–450.0)

## 2017-11-12 LAB — HX RETICULOCYTE COUNT AUTOMATED
CASE NUMBER: 17949108
HX ABSOLUTE RETIC COUNT: 0.1356 10*6/uL — ABNORMAL HIGH (ref 0.02–0.08)
HX RETIC COUNT PERCENT: 6.8 %

## 2017-11-12 LAB — HX POTASSIUM LEVEL
CASE NUMBER: 2019131001647
HX POTASSIUM LVL: 3.5 mmol/L — ABNORMAL LOW (ref 3.6–5.2)

## 2017-11-12 LAB — HX GLOMERULAR FILTRATION RATE (ESTIMATED)
CASE NUMBER: 2019131000187
HX AFN AMER GLOMERULAR FILTRATION RATE: >90
HX NON-AFN AMER GLOMERULAR FILTRATION RATE: 89 mL/min/{1.73_m2}

## 2017-11-12 LAB — HX POTASSIUM LEVEL URINE
CASE NUMBER: 2019131001028
HX U POTASSIUM: 47.3 mmol/L — NL (ref 12.0–129.0)

## 2017-11-12 LAB — HX MAGNESIUM LEVEL
CASE NUMBER: 2019131000187
CASE NUMBER: 2019131001647
HX MAGNESIUM LVL: 1.8 mg/dL — NL (ref 1.7–2.5)
HX MAGNESIUM LVL: 1.9 mg/dL — NL (ref 1.7–2.5)

## 2017-11-12 LAB — HX CREATININE URINE
CASE NUMBER: 2019131001028
HX U CREAT: 73 mg/dL — NL (ref 15.0–328.0)

## 2017-11-12 LAB — HX SODIUM LEVEL URINE
CASE NUMBER: 2019131001028
HX U SODIUM: 46 mmol/L — NL (ref 28.0–272.0)

## 2017-11-12 LAB — HX OSMOLALITY URINE
CASE NUMBER: 2019131001028
HX U OSMOLALITY: 384 mosm/kg — NL (ref 50.0–1400.0)

## 2017-11-13 LAB — HX CBC W/ INDICES
CASE NUMBER: 2019132000548
HX ABSOLUTE NRBC COUNT: 0 10*3/uL
HX HCT: 21.5 % — ABNORMAL LOW (ref 36.0–47.0)
HX HGB: 6.6 g/dL — CR (ref 11.8–16.0)
HX MCH: 33.2 pg — NL (ref 26.0–34.0)
HX MCHC: 30.7 g/dL — ABNORMAL LOW (ref 31.0–37.0)
HX MCV: 108 fL — ABNORMAL HIGH (ref 80.0–100.0)
HX MPV: 9.1 fL — ABNORMAL LOW (ref 9.4–12.4)
HX NRBC PERCENT: 0 % — NL
HX PLATELET: 262 10*3/uL — NL (ref 150.0–400.0)
HX RBC: 1.99 10*6/uL — ABNORMAL LOW (ref 3.9–5.2)
HX RDW-CV: 20.3 % — ABNORMAL HIGH (ref 11.5–14.5)
HX RDW-SD: 78.9 fL — ABNORMAL HIGH (ref 35.0–51.0)
HX WBC: 4.8 10*3/uL — NL (ref 3.7–11.2)

## 2017-11-13 LAB — HX BASIC METABOLIC PANEL
CASE NUMBER: 2019132000246
HX ANION GAP: 5 — NL (ref 3.0–11.0)
HX BUN: 6 mg/dL — ABNORMAL LOW (ref 8.0–23.0)
HX CALCIUM LVL: 7.1 mg/dL — ABNORMAL LOW (ref 8.5–10.5)
HX CHLORIDE: 112 mmol/L — ABNORMAL HIGH (ref 98.0–110.0)
HX CO2: 28 mmol/L — NL (ref 21.0–32.0)
HX CREATININE: 0.721 mg/dL — NL (ref 0.55–1.3)
HX GLUCOSE LVL: 97 mg/dL — NL (ref 70.0–110.0)
HX POTASSIUM LVL: 3.2 mmol/L — ABNORMAL LOW (ref 3.6–5.2)
HX SODIUM LVL: 145 mmol/L — NL (ref 136.0–146.0)

## 2017-11-13 LAB — HX CROSSMATCH: CASE NUMBER: 2019129001829

## 2017-11-13 LAB — HX POTASSIUM LEVEL
CASE NUMBER: 2019132000822
HX POTASSIUM LVL: 4.1 mmol/L — NL (ref 3.6–5.2)

## 2017-11-13 LAB — HX GLOMERULAR FILTRATION RATE (ESTIMATED)
CASE NUMBER: 2019132000246
HX AFN AMER GLOMERULAR FILTRATION RATE: >90
HX NON-AFN AMER GLOMERULAR FILTRATION RATE: 88 mL/min/{1.73_m2}

## 2017-11-13 LAB — HX MAGNESIUM LEVEL
CASE NUMBER: 2019132000246
HX MAGNESIUM LVL: 1.8 mg/dL — NL (ref 1.7–2.5)

## 2017-11-13 LAB — HX .REDCELL
CASE NUMBER: 2019132000910
HX ADDITIONAL UNITS TO CROSSMATCH: 0 — NL
HX NUMBER OF UNITS TO TRANSFUSE: 1

## 2017-11-14 LAB — HX BASIC METABOLIC PANEL
CASE NUMBER: 2019133000237
HX ANION GAP: 7 — NL (ref 3.0–11.0)
HX BUN: 6 mg/dL — ABNORMAL LOW (ref 8.0–23.0)
HX CALCIUM LVL: 7.3 mg/dL — ABNORMAL LOW (ref 8.5–10.5)
HX CHLORIDE: 111 mmol/L — ABNORMAL HIGH (ref 98.0–110.0)
HX CO2: 25 mmol/L — NL (ref 21.0–32.0)
HX CREATININE: 0.719 mg/dL — NL (ref 0.55–1.3)
HX GLUCOSE LVL: 78 mg/dL — NL (ref 70.0–110.0)
HX POTASSIUM LVL: 3.5 mmol/L — ABNORMAL LOW (ref 3.6–5.2)
HX SODIUM LVL: 143 mmol/L — NL (ref 136.0–146.0)

## 2017-11-14 LAB — HX CBC W/ INDICES
CASE NUMBER: 2019133000237
HX ABSOLUTE NRBC COUNT: 0 10*3/uL
HX HCT: 25 % — ABNORMAL LOW (ref 36.0–47.0)
HX HGB: 8 g/dL — ABNORMAL LOW (ref 11.8–16.0)
HX MCH: 33.6 pg — NL (ref 26.0–34.0)
HX MCHC: 32 g/dL — NL (ref 31.0–37.0)
HX MCV: 105 fL — ABNORMAL HIGH (ref 80.0–100.0)
HX MPV: 9 fL — ABNORMAL LOW (ref 9.4–12.4)
HX NRBC PERCENT: 0 % — NL
HX PLATELET: 255 10*3/uL — NL (ref 150.0–400.0)
HX RBC: 2.38 10*6/uL — ABNORMAL LOW (ref 3.9–5.2)
HX RDW-CV: 22.5 % — ABNORMAL HIGH (ref 11.5–14.5)
HX RDW-SD: 82.7 fL — ABNORMAL HIGH (ref 35.0–51.0)
HX WBC: 3.5 10*3/uL — ABNORMAL LOW (ref 3.7–11.2)

## 2017-11-14 LAB — HX POTASSIUM LEVEL
CASE NUMBER: 2019133001359
HX POTASSIUM LVL: 4.8 mmol/L — NL (ref 3.6–5.2)

## 2017-11-14 LAB — HX GLOMERULAR FILTRATION RATE (ESTIMATED)
CASE NUMBER: 2019133000237
HX AFN AMER GLOMERULAR FILTRATION RATE: >90
HX NON-AFN AMER GLOMERULAR FILTRATION RATE: 88 mL/min/{1.73_m2}

## 2017-11-14 LAB — HX MAGNESIUM LEVEL
CASE NUMBER: 17955137
HX MAGNESIUM LVL: 2 mg/dL — NL (ref 1.7–2.5)

## 2017-11-15 LAB — HX POTASSIUM LEVEL
CASE NUMBER: 2019134000013
CASE NUMBER: 2019134000026
CASE NUMBER: 2019134003380
HX POTASSIUM LVL: 4.1 mmol/L — NL (ref 3.6–5.2)
HX POTASSIUM LVL: 4.4 mmol/L — NL (ref 3.6–5.2)
HX POTASSIUM LVL: 4 mmol/L — NL (ref 3.6–5.2)

## 2017-11-15 LAB — HX URINALYSIS (COMPLETE)
CASE NUMBER: 2019133000662
HX UA BILIRUBIN: NEGATIVE — NL
HX UA BLOOD: NEGATIVE — NL
HX UA GLUCOSE: NEGATIVE — NL
HX UA KETONES: NEGATIVE — NL
HX UA LEUKOCYTE ESTERASE: NEGATIVE — NL
HX UA NITRITE: NEGATIVE — NL
HX UA PH: 7 — NL (ref 5.0–8.0)
HX UA PROTEIN: NEGATIVE — NL
HX UA RBC: 1 /HPF — NL (ref 0.0–2.0)
HX UA SPECIFIC GRAVITY: 1.012 — NL (ref 1.003–1.03)
HX UA SQUAMOUS EPITHELIAL: 1 /HPF — NL (ref 0.0–5.0)
HX UA UROBILINOGEN: NEGATIVE — NL
HX UA WBC: 1 /HPF — NL (ref 0.0–5.0)

## 2017-11-15 LAB — HX CBC W/ INDICES
CASE NUMBER: 2019134001051
HX ABSOLUTE NRBC COUNT: 0 10*3/uL
HX HCT: 26.6 % — ABNORMAL LOW (ref 36.0–47.0)
HX HGB: 8.6 g/dL — ABNORMAL LOW (ref 11.8–16.0)
HX MCH: 33.6 pg — NL (ref 26.0–34.0)
HX MCHC: 32.3 g/dL — NL (ref 31.0–37.0)
HX MCV: 103.9 fL — ABNORMAL HIGH (ref 80.0–100.0)
HX MPV: 8.9 fL — ABNORMAL LOW (ref 9.4–12.4)
HX NRBC PERCENT: 0 % — NL
HX PLATELET: 275 10*3/uL — NL (ref 150.0–400.0)
HX RBC: 2.56 10*6/uL — ABNORMAL LOW (ref 3.9–5.2)
HX RDW-CV: 22 % — ABNORMAL HIGH (ref 11.5–14.5)
HX RDW-SD: 81.2 fL — ABNORMAL HIGH (ref 35.0–51.0)
HX WBC: 4.3 10*3/uL — NL (ref 3.7–11.2)

## 2017-11-15 LAB — HX BASIC METABOLIC PANEL
CASE NUMBER: 17955138
HX ANION GAP: 6 — NL (ref 3.0–11.0)
HX BUN: 6 mg/dL — ABNORMAL LOW (ref 8.0–23.0)
HX CALCIUM LVL: 7.7 mg/dL — ABNORMAL LOW (ref 8.5–10.5)
HX CHLORIDE: 111 mmol/L — ABNORMAL HIGH (ref 98.0–110.0)
HX CO2: 25 mmol/L — NL (ref 21.0–32.0)
HX CREATININE: 0.574 mg/dL — NL (ref 0.55–1.3)
HX GLUCOSE LVL: 85 mg/dL — NL (ref 70.0–110.0)
HX POTASSIUM LVL: 4.1 mmol/L — NL (ref 3.6–5.2)
HX SODIUM LVL: 142 mmol/L — NL (ref 136.0–146.0)

## 2017-11-15 LAB — HX GLOMERULAR FILTRATION RATE (ESTIMATED)
CASE NUMBER: 2019134000013
HX AFN AMER GLOMERULAR FILTRATION RATE: >90
HX NON-AFN AMER GLOMERULAR FILTRATION RATE: >90

## 2017-11-16 LAB — HX OSMOLALITY URINE
CASE NUMBER: 2019135000633
HX U OSMOLALITY: 491 mosm/kg — NL (ref 50.0–1400.0)

## 2017-11-16 LAB — HX .AUTOMATED DIFF
CASE NUMBER: 2019135000139
HX ABSOLUTE BASO COUNT: 0.01 10*3/uL — NL (ref 0.0–0.22)
HX ABSOLUTE EOS COUNT: 0.06 10*3/uL — NL (ref 0.0–0.45)
HX ABSOLUTE LYMPHS COUNT: 0.86 10*3/uL — NL (ref 0.74–5.04)
HX ABSOLUTE MONO COUNT: 0.62 10*3/uL — NL (ref 0.0–1.34)
HX ABSOLUTE NEUTRO COUNT: 3.1 10*3/uL — NL (ref 1.48–7.95)
HX BASOPHILS: 0.2 %
HX EOSINOPHILS: 1.3 %
HX IMMATURE GRANULOCYTES: 0.6 % — NL (ref 0.0–2.0)
HX LYMPHOCYTES: 18.4 %
HX MONOCYTES: 13.2 %
HX NEUTROPHILS: 66.3 %

## 2017-11-16 LAB — HX CREATININE URINE
CASE NUMBER: 2019135000633
HX U CREAT: 81 mg/dL — NL (ref 15.0–328.0)

## 2017-11-16 LAB — HX BASIC METABOLIC PANEL
CASE NUMBER: 17964060
HX ANION GAP: 6 — NL (ref 3.0–11.0)
HX BUN: 7 mg/dL — ABNORMAL LOW (ref 8.0–23.0)
HX CALCIUM LVL: 7.5 mg/dL — ABNORMAL LOW (ref 8.5–10.5)
HX CHLORIDE: 111 mmol/L — ABNORMAL HIGH (ref 98.0–110.0)
HX CO2: 26 mmol/L — NL (ref 21.0–32.0)
HX CREATININE: 0.616 mg/dL — NL (ref 0.55–1.3)
HX GLUCOSE LVL: 78 mg/dL — NL (ref 70.0–110.0)
HX POTASSIUM LVL: 3.6 mmol/L — NL (ref 3.6–5.2)
HX SODIUM LVL: 143 mmol/L — NL (ref 136.0–146.0)

## 2017-11-16 LAB — HX GLOMERULAR FILTRATION RATE (ESTIMATED)
CASE NUMBER: 2019135000004
HX AFN AMER GLOMERULAR FILTRATION RATE: >90
HX NON-AFN AMER GLOMERULAR FILTRATION RATE: >90

## 2017-11-16 LAB — HX .RBC MORPHOLOGY: CASE NUMBER: 2019135000139

## 2017-11-16 LAB — HX CBC W/ DIFF
CASE NUMBER: 2019135000139
HX ABSOLUTE NRBC COUNT: 0 10*3/uL
HX HCT: 23.9 % — ABNORMAL LOW (ref 36.0–47.0)
HX HGB: 7.5 g/dL — ABNORMAL LOW (ref 11.8–16.0)
HX MCH: 33 pg — NL (ref 26.0–34.0)
HX MCHC: 31.4 g/dL — NL (ref 31.0–37.0)
HX MCV: 105.3 fL — ABNORMAL HIGH (ref 80.0–100.0)
HX MPV: 8.9 fL — ABNORMAL LOW (ref 9.4–12.4)
HX NRBC PERCENT: 0 % — NL
HX PLATELET: 276 10*3/uL — NL (ref 150.0–400.0)
HX RBC: 2.27 10*6/uL — ABNORMAL LOW (ref 3.9–5.2)
HX RDW-CV: 21.4 % — ABNORMAL HIGH (ref 11.5–14.5)
HX RDW-SD: 82.2 fL — ABNORMAL HIGH (ref 35.0–51.0)
HX WBC: 4.7 10*3/uL — NL (ref 3.7–11.2)

## 2017-11-16 LAB — HX POTASSIUM LEVEL URINE
CASE NUMBER: 2019135000633
HX U POTASSIUM: 65.1 mmol/L — NL (ref 12.0–129.0)

## 2017-11-16 LAB — HX HEMOGLOBIN AND HEMATOCRIT
CASE NUMBER: 2019135002074
HX HCT: 28.1 % — ABNORMAL LOW (ref 36.0–47.0)
HX HGB: 8.9 g/dL — ABNORMAL LOW (ref 11.8–16.0)

## 2017-11-16 LAB — HX VITAMIN B12 LEVEL
CASE NUMBER: 2019135000004
HX VITAMIN B12 LVL: 1487 pg/mL — ABNORMAL HIGH (ref 193.0–986.0)

## 2017-11-16 LAB — HX OSMOLALITY
CASE NUMBER: 2019135000004
HX OSMOLALITY: 291 mosm/kg — NL (ref 275.0–300.0)

## 2017-11-16 LAB — HX FOLATE LEVEL
CASE NUMBER: 2019135000004
HX FOLATE LVL: 19.6 ng/mL — NL

## 2017-11-17 LAB — HX BLOOD CULTURE
CASE NUMBER: 2019132000960
CASE NUMBER: 2019132000986
HX F: NO GROWTH
HX F: NO GROWTH

## 2017-11-23 LAB — HX .AUTOMATED DIFF
CASE NUMBER: 2019142003992
HX ABSOLUTE BASO COUNT: 0.02 10*3/uL — NL (ref 0.0–0.22)
HX ABSOLUTE EOS COUNT: 0.05 10*3/uL — NL (ref 0.0–0.45)
HX ABSOLUTE LYMPHS COUNT: 1.35 10*3/uL — NL (ref 0.74–5.04)
HX ABSOLUTE MONO COUNT: 1.31 10*3/uL — NL (ref 0.0–1.34)
HX ABSOLUTE NEUTRO COUNT: 11.45 10*3/uL — ABNORMAL HIGH (ref 1.48–7.95)
HX BASOPHILS: 0.1 %
HX EOSINOPHILS: 0.4 %
HX IMMATURE GRANULOCYTES: 0.6 % — NL (ref 0.0–2.0)
HX LYMPHOCYTES: 9.5 %
HX MONOCYTES: 9.2 %
HX NEUTROPHILS: 80.2 %

## 2017-11-23 LAB — HX CBC W/ DIFF
CASE NUMBER: 2019142003992
HX ABSOLUTE NRBC COUNT: 0 10*3/uL
HX HCT: 23.4 % — ABNORMAL LOW (ref 36.0–47.0)
HX HGB: 7.1 g/dL — ABNORMAL LOW (ref 11.8–16.0)
HX MCH: 33 pg — NL (ref 26.0–34.0)
HX MCHC: 30.3 g/dL — ABNORMAL LOW (ref 31.0–37.0)
HX MCV: 108.8 fL — ABNORMAL HIGH (ref 80.0–100.0)
HX MPV: 9.1 fL — ABNORMAL LOW (ref 9.4–12.4)
HX NRBC PERCENT: 0 % — NL
HX PLATELET: 319 10*3/uL — NL (ref 150.0–400.0)
HX RBC: 2.15 10*6/uL — ABNORMAL LOW (ref 3.9–5.2)
HX RDW-CV: 20.4 % — ABNORMAL HIGH (ref 11.5–14.5)
HX RDW-SD: 80.8 fL — ABNORMAL HIGH (ref 35.0–51.0)
HX WBC: 14.3 10*3/uL — ABNORMAL HIGH (ref 3.7–11.2)

## 2017-11-23 LAB — HX BLUE TOP TO HOLD: CASE NUMBER: 2019142004013

## 2017-11-23 LAB — HX SST GOLD TUBE TO HOLD: CASE NUMBER: 2019142004013

## 2017-11-24 ENCOUNTER — Inpatient Hospital Stay
Admit: 2017-11-24 | Disposition: A | Discharge status: Skilled Nursing Facility | Source: Home / Self Care | Attending: Emergency Medicine | Admitting: Emergency Medicine

## 2017-11-24 LAB — HX URINE DIPSTICK W/REFLEX
CASE NUMBER: 2019142003993
HX UA BILIRUBIN: NEGATIVE — NL
HX UA BLOOD: NEGATIVE — NL
HX UA GLUCOSE: NEGATIVE — NL
HX UA KETONES: NEGATIVE — NL
HX UA LEUKOCYTE ESTERASE: 75 WBC/uL — AB
HX UA NITRITE: NEGATIVE — NL
HX UA PH: 6 — NL (ref 5.0–8.0)
HX UA PROTEIN: NEGATIVE — NL
HX UA RBC: 1 /HPF — NL (ref 0.0–2.0)
HX UA SPECIFIC GRAVITY: 1.014 — NL (ref 1.003–1.03)
HX UA SQUAMOUS EPITHELIAL: 3 /HPF — NL (ref 0.0–5.0)
HX UA UROBILINOGEN: NEGATIVE — NL
HX UA WBC: 1 /HPF — NL (ref 0.0–5.0)

## 2017-11-24 LAB — HX INFLUENZA A&B BY PCR
CASE NUMBER: 2019143001553
HX INFLUENZA A BY PCR: NOT DETECTED
HX INFLUENZA B BY PCR: NOT DETECTED

## 2017-11-24 LAB — HX CBC W/ DIFF
CASE NUMBER: 2019143000399
HX ABSOLUTE NRBC COUNT: 0 10*3/uL
HX HCT: 24.3 % — ABNORMAL LOW (ref 36.0–47.0)
HX HGB: 7.5 g/dL — ABNORMAL LOW (ref 11.8–16.0)
HX MCH: 33.9 pg — NL (ref 26.0–34.0)
HX MCHC: 30.9 g/dL — ABNORMAL LOW (ref 31.0–37.0)
HX MCV: 110 fL — ABNORMAL HIGH (ref 80.0–100.0)
HX MPV: 9 fL — ABNORMAL LOW (ref 9.4–12.4)
HX NRBC PERCENT: 0 % — NL
HX PLATELET: 307 10*3/uL — NL (ref 150.0–400.0)
HX RBC: 2.21 10*6/uL — ABNORMAL LOW (ref 3.9–5.2)
HX RDW-CV: 20.1 % — ABNORMAL HIGH (ref 11.5–14.5)
HX RDW-SD: 81.8 fL — ABNORMAL HIGH (ref 35.0–51.0)
HX WBC: 11.7 10*3/uL — ABNORMAL HIGH (ref 3.7–11.2)

## 2017-11-24 LAB — HX .AUTOMATED DIFF
CASE NUMBER: 2019143000399
HX ABSOLUTE BASO COUNT: 0.01 10*3/uL — NL (ref 0.0–0.22)
HX ABSOLUTE EOS COUNT: 0.06 10*3/uL — NL (ref 0.0–0.45)
HX ABSOLUTE LYMPHS COUNT: 1.11 10*3/uL — NL (ref 0.74–5.04)
HX ABSOLUTE MONO COUNT: 0.91 10*3/uL — NL (ref 0.0–1.34)
HX ABSOLUTE NEUTRO COUNT: 9.59 10*3/uL — ABNORMAL HIGH (ref 1.48–7.95)
HX BASOPHILS: 0.1 %
HX EOSINOPHILS: 0.5 %
HX IMMATURE GRANULOCYTES: 0.5 % — NL (ref 0.0–2.0)
HX LYMPHOCYTES: 9.5 %
HX MONOCYTES: 7.8 %
HX NEUTROPHILS: 81.6 %

## 2017-11-24 LAB — HX BASIC METABOLIC PANEL
CASE NUMBER: 2019143000399
HX ANION GAP: 9 — NL (ref 3.0–11.0)
HX BUN: 7 mg/dL — ABNORMAL LOW (ref 8.0–23.0)
HX CALCIUM LVL: 7.4 mg/dL — ABNORMAL LOW (ref 8.5–10.5)
HX CHLORIDE: 110 mmol/L — NL (ref 98.0–110.0)
HX CO2: 23 mmol/L — NL (ref 21.0–32.0)
HX CREATININE: 0.895 mg/dL — NL (ref 0.55–1.3)
HX GLUCOSE LVL: 168 mg/dL — ABNORMAL HIGH (ref 70.0–110.0)
HX POTASSIUM LVL: 3.5 mmol/L — ABNORMAL LOW (ref 3.6–5.2)
HX SODIUM LVL: 142 mmol/L — NL (ref 136.0–146.0)

## 2017-11-24 LAB — HX COMPREHENSIVE METABOLIC PANEL
CASE NUMBER: 2019142003992
HX ALBUMIN LVL: 1.4 g/dL — ABNORMAL LOW (ref 3.2–5.0)
HX ALKALINE PHOSPHATASE: 140 U/L — ABNORMAL HIGH (ref 30.0–117.0)
HX ALT: <6 — NL (ref 6.0–55.0)
HX ANION GAP: 8 — NL (ref 3.0–11.0)
HX AST: 11 U/L — NL (ref 6.0–40.0)
HX BILIRUBIN TOTAL: 0.3 mg/dL — NL (ref 0.2–1.2)
HX BUN: 8 mg/dL — NL (ref 8.0–23.0)
HX CALCIUM LVL: 7.7 mg/dL — ABNORMAL LOW (ref 8.5–10.5)
HX CHLORIDE: 111 mmol/L — ABNORMAL HIGH (ref 98.0–110.0)
HX CO2: 24 mmol/L — NL (ref 21.0–32.0)
HX CREATININE: 0.965 mg/dL — NL (ref 0.55–1.3)
HX GLUCOSE LVL: 78 mg/dL — NL (ref 70.0–110.0)
HX POTASSIUM LVL: 4 mmol/L — NL (ref 3.6–5.2)
HX SODIUM LVL: 143 mmol/L — NL (ref 136.0–146.0)
HX TOTAL PROTEIN: 5.6 g/dL — ABNORMAL LOW (ref 6.0–8.4)

## 2017-11-24 LAB — HX LIPASE LEVEL
CASE NUMBER: 2019142003992
HX LIPASE LVL: 34 U/L — ABNORMAL LOW (ref 73.0–393.0)

## 2017-11-24 LAB — HX .RBC MORPHOLOGY
CASE NUMBER: 2019142003992
CASE NUMBER: 2019143000399

## 2017-11-24 LAB — HX LACTIC ACID
CASE NUMBER: 2019142003992
CASE NUMBER: 2019143000200
HX LACTIC ACID LVL: 1.3 mmol/L — NL (ref 0.4–2.0)
HX LACTIC ACID LVL: 2.2 mmol/L — ABNORMAL HIGH (ref 0.4–2.0)

## 2017-11-24 LAB — HX GLOMERULAR FILTRATION RATE (ESTIMATED)
CASE NUMBER: 2019142003992
HX AFN AMER GLOMERULAR FILTRATION RATE: 71 mL/min/{1.73_m2}
HX NON-AFN AMER GLOMERULAR FILTRATION RATE: 62 mL/min/{1.73_m2}

## 2017-11-24 LAB — HX C DIFFICILE TOXIN B BY PCR
CASE NUMBER: 2019143002083
HX C DIFF 027 BY PCR: NEGATIVE
HX C DIFFICILE TOXIN B BY PCR: NOT DETECTED

## 2017-11-24 LAB — HX PHOSPHORUS LEVEL
CASE NUMBER: 2019142003992
HX PHOSPHORUS: 2.8 mg/dL — NL (ref 2.4–4.9)

## 2017-11-24 LAB — HX TROPONIN I
CASE NUMBER: 2019142003992
HX TROPONIN I: <0.015 — NL (ref 0.015–0.045)

## 2017-11-24 LAB — HX MAGNESIUM LEVEL
CASE NUMBER: 2019142003992
HX MAGNESIUM LVL: 1.6 mg/dL — ABNORMAL LOW (ref 1.7–2.5)

## 2017-11-24 LAB — HX NT-PROBNP
CASE NUMBER: 2019142003992
HX NT-PROBNP: 4220 pg/mL — ABNORMAL HIGH (ref 0.0–900.0)

## 2017-11-24 LAB — HX C-REACTIVE PROTEIN (CRP)
CASE NUMBER: 2019142003992
HX C-REACTIVE PROTEIN: 5.95 mg/dL — ABNORMAL HIGH (ref 0.0–0.8)

## 2017-11-24 NOTE — ED Notes (Signed)
Please click on link to see document

## 2017-11-24 NOTE — Progress Notes (Signed)
Subjective    has cough, sitting on the chair, disoriented earlier    Review of Systems    Objective    Vitals & Measurements    **T: **97.4 F  (Oral) **TMIN: **97.4 F  (Oral) **TMAX: **98.8 F  (Oral)  **HR: **105 (Peripheral Pulse) **RR: **20 **BP: **128/78 **SpO2: **98% **O2  Method (L/min): **Room air    Physical Exam    alert oriented    heent atraumatic normocephalic    heart s1s2    lungs b/l airentry    abd soft and non tender    neuro at baseline    psych normal mood and behaviour    Medications    _Inpatient_    acetaminophen tablet, 650 mg= 2 tab(s), PO, q4hr, PRN    albuterol 2.5 mg/3 mL (0.083%) inhalation solution, 2.5 mg= 3 mL, NEB, q4hr,  PRN    alteplase (catheter clearing), 2 mg, Dwell, Once    amitriptyline, 100 mg= 2 tab(s), PO, HS    Ativan, 0.5 mg= 1 tab(s), PO, q6hr, PRN    bacitracin 500 units/g topical ointment, 1 app, TOP, ud, PRN    bisacodyl, 10 mg= 2 tab(s), PO, Daily    bisacodyl, 10 mg= 1 supp, PR, Daily    budesonide 0.5 mg/2 mL inhalation suspension, 0.5 mg= 2 mL, NEB, BID    cefepime    Culturelle Digestive Health oral capsule, 1 cap(s), PO, BID    Cymbalta, 60 mg= 2 cap(s), PO, Daily    enoxaparin, 40 mg= 0.4 mL, sc, q24hr    Fleet Enema, 133 mL, PR, Daily    folic acid, 1 mg= 1 tab(s), PO, Daily    formoterol 20 mcg/2 mL inhalation solution, 20 mcg= 2 mL, NEB, BID    glycopyrrolate 15.6 mcg inhalation capsule, 15.6 mcg= 1 cap(s), INHAL, BID    guaiFENesin, 100 mg= 5 mL, PO, q4hr, PRN    KlonoPIN, 0.25 mg= 0.5 tab(s), PO, BID, PRN    Lasix, 40 mg= 1 tab(s), PO, Daily    Latuda, 120 mg= 6 tab(s), PO, Daily    Metamucil, 1 EA, PO, Daily    Milk of Magnesia 8% oral suspension, 2400 mg= 30 mL, PO, Daily    Mucinex, 600 mg= 1 tab(s), PO, q12hr    OLANZapine, 5 mg= 1 tab(s), PO, Daily    ondansetron, 4 mg= 2 mL, IV Push, q8hr, PRN    oxyCODONE, 5 mg= 1 tab(s), PO, q6hr, PRN    Potassium Chloride Oral, 40 mEq= 30 mL, PO, Once    Protonix, 40 mg= 1 tab(s), PO, BID    Senokot S 50  mg-8.6 mg oral tablet, 1 tab(s), PO, BID    Sodium Chloride 0.9% Flush, 10 mL, Flush, q12hr    Sodium Chloride 0.9% Flush, 10 mL, Flush, ud, PRN    traMADol, 50 mg= 1 tab(s), PO, q6hr, PRN    traZODone, 25 mg= 0.5 tab(s), PO, HS, PRN    vancomycin    Vitamin B12, 500 mcg= 0.5 tab(s), PO, Daily    Lab Results    Vancomycin Trough: 25.2 mcg/ml Critical (11/25/17 16:02:00)    Diagnostic Results    IMPRESSION: Central line appears to be appropriately positioned with its tip  at    or just below the cavoatrial junction. No pneumothorax. [1]    Impression and Plan    This is a 67 year old female, with past medical history of COPD, hypertension,  diastolic congestive heart failure, PUD, MSSA bacteremia, recent diagnosis and  treatment of lower extremity cellulitis, spinal osteomyelitis recent admission  discharged to rehabilitation with antibiotic treatment since March 2019 was  sent from rehabilitation due to fever hypertension and lethargy. He had one  episode of diarrhea on admission.        -Severe sepsis: probably pneumonia has cough, empirically on IV vancomycin and cefepime, cultures remain negative    agree with ID recs        -Diarrhea: On admission, C. difficile is negative,        -History of diastolic heart failure: No sign of decompensation, resolved on Lasix 40 mg by mouth daily        -Anemia: acute on Chronic, anemia of chronic disease no active bleeding reported recent normal iron studies vitamin B12 and folic acid, guaiac negative, continue monitor H&H and transfuse as needed        -COPD: No sign of wheezing on examination, continue when necessary nebulizer        =-Hypokalemia, replete and monitor [2]        MOLST    [1] XR Chest Single View; Marcella Dubs 11/25/2017 19:32 EDT    [2] Progress/SOAP Note; Ruby Cola MD, Hamid (HOSP) 11/25/2017 13:10 EDT    SIGNATURE LINE Electronically signed by Einar Gip MD, Ahtziry Saathoff on 11/26/2017 at  13:39:32 EST

## 2017-11-24 NOTE — Discharge Summary (Signed)
Date of Admission    11/24/2017    Date of Discharge    11/28/2017    Admission History    Code Status    Code Status - Ordered    -- 11/24/17 9:36:00 EDT, Full Resuscitation, ****[facility]****, Constant Order    Allergies    NKA    Social History    _Alcohol_    Current, 1-2 times per week    Past    _Substance Abuse_    Never    _Tobacco_    Former smoker, Cigarettes    Hospital Course    History of Present Illness    66F COPD, HTN, BPD, dCHF, PUD and recently tx'd for LE cellulitis, MSSA  bacteremia, and spinal osteo who p/w fever and hypoTN. Pt was admitted in  March for MSSA bacteremia, was found to have spinal osteo, and then again in  April for LLE cellulitis. She has been on long term abx (clinda/ancef) through  PICC line. Yesterday, she was sent from rehab due to fever, hypotension and  lethargy. Vital signs listed as BP 87/64 HR 103, RR 26 Temp 101.2. She tells  me now that she has had a few days of cough, sore throat and mild SOB. She  also notes mild LLQ pain, poor appetite, and loose stools for the last week.  No N/V. No CP. No UTI sx. No new rashes. No change in chronic back pain. No  new N/W/T. In the ER, she had an episode of diarrhea. She has rec'd  vanco/zosyn and IVF. [1]    This is a 67 year old female, with past medical history of COPD, hypertension,  diastolic congestive heart failure, PUD, MSSA bacteremia, recent diagnosis and  treatment of lower extremity cellulitis, spinal osteomyelitis recent admission  discharged to rehabilitation with antibiotic treatment since March 2019 was  sent from rehabilitation due to fever hypertension and lethargy, had one  episode of diarrhea on admission.        -Severe sepsis: pneumonia CT abd report shows patchy consolidation of left lower lobe suggests superimposed PNA, empirically on IV vancomycin and cefepime, cultures remain negative. MRSA swab negative, Vancomycin stopped d/c on iv cefepime, full recs from ID    agree with ID recs        -Diarrhea: On  admission, C. difficile is negative,        -History of diastolic heart failure: No sign of decompensation, resolved on Lasix 40 mg by mouth daily        -Anemia: acute on Chronic, anemia of chronic disease no active bleeding reported recent normal iron studies vitamin B12 and folic acid, guaiac negative, continue monitor H&H and transfuse as needed        -COPD: No sign of wheezing on examination, continue when necessary nebulizer        =-Hypokalemia, repleted [3]        Full code per [facility] form [2]        ID recs:    P: Continue cefepime for 7 days total for HCAP.    After that, can d/c PICC and narrow to PO levofloxacin 500mg  daily for ongoing  treatment of back. Continue this until follow up with me. Weekly CBC w/diff,  BUN/Cr, LFTs, and ESR/CRP checked and faxed to me at [phone]. [3]    Procedures and Treatment Provided    IMPRESSION:    1\.  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 This was  previously    assessed with lumbar spine MRI.  Comparison with MRI is limited due to  difference    in imaging modality.    2\.  Hepatic steatosis.    3\.  Mild prominence of gastric mucosal folds in excluded portion of the  stomach    could represent gastritis but appears stable.    4\.  Bibasal atelectasis. Patchy consolidation left lower lobe suggests    superimposed pneumonia. Some nodularity is demonstrated along the left oblique    fissure. Consider follow-up chest CT after treatment to ensure resolution. [4]        IMPRESSION: Central line appears to be appropriately positioned with its tip  at    or just below the cavoatrial junction. No pneumothorax. [5]    Physical Exam    Vitals & Measurements    **T: **97.8 F  (Oral) **TMIN: **97.8 F  (Oral) **TMAX: **98.9 F  (Oral)  **HR: **95 (Peripheral Pulse) **RR: **18 **BP: **97/46 **SpO2: **99% **O2  Method (L/min): **Room air    alert oriented    heent atraumatic normocephalic    heart s1s2    lungs b/l airentry    abd soft and non tender     neuro at baseline    psych normal mood and behaviour [6]    Lab Results    WBC: 5.8 thous/mm3 (11/28/17 06:05:00)    RBC: 2.14 Mil/mm3 Low (11/28/17 06:05:00)    Hgb: 7.2 Gm/dL Low (52/84/13 24:40:10)    Hct: 23.3 % Low (11/28/17 06:05:00)    Platelet: 314 thous/mm3 (11/28/17 06:05:00)    MCV: 108.9 fL High (11/28/17 06:05:00)    MCH: 33.6 pGm (11/28/17 06:05:00)    MCHC: 30.9 Gm/dL Low (27/25/36 64:40:34)    RDW-SD: 79.7 fL High (11/28/17 06:05:00)    MPV: 9 fL Low (11/28/17 06:05:00)    NRBC Percent: 0 % (11/28/17 06:05:00)    Absolute NRBC Count: 0 thous/mm3 (11/28/17 06:05:00)    Discharge Diagnoses    Basal pneumonia    Febrile illness    Fever    Discharge Medications    _Discharge_    acetaminophen 325 mg oral tablet, 650 mg, PO, q4hr, PRN    albuterol 2.5 mg/3 mL (0.083%) inhalation solution, 2.5 mg= 3 mL, NEB, q4hr,  PRN    amitriptyline, 100 mg, PO, HS    Ativan 0.5 mg oral tablet, 0.5 mg= 1 tab(s), PO, q6hr, PRN, [id number], stop  12/01/2017    Breo Ellipta 100 mcg-25 mcg/inh inhalation powder, 1 puff(s), INHAL, Daily    cefepime 1 g injection, 1 Gm, IV, q12hr    Culturelle Digestive Health oral capsule, 1 cap(s), PO, BID    Cymbalta 60 mg oral delayed release capsule, 60 mg= 1 cap(s), PO, Daily    docusate sodium 100 mg oral capsule, 100 mg= 1 cap(s), PO, BID, PRN, 2 refills    docusate-senna 50 mg-8.6 mg oral tablet, 1 tab(s), PO, BID    folic acid 1 mg oral tablet, 1 mg= 1 tab(s), PO, Daily    furosemide 40 mg oral tablet, 40 mg, PO, BID    guaiFENesin 100 mg/5 mL oral liquid, 100 mg= 5 mL, PO, q4hr, PRN    guaiFENesin 600 mg oral tablet, extended release, 600 mg= 1 tab(s), PO, q12hr    Latuda, 120 mg, PO, Daily    OLANZapine 5 mg oral tablet, disintegrating, 5 mg= 1 tab(s), PO, Daily    oxyCODONE 5 mg oral tablet, 5 mg= 1 tab(s), PO, q6hr, PRN, Partial fill upon  request    potassium  chloride 20 mEq/15 mL oral liquid, 40 mEq= 30 mL, PO, TID    Protonix, 40 mg, PO, BID    Spiriva 18 mcg inhalation  capsule, 18 mcg= 1 cap(s), INHAL, Daily    Vitamin B12 250 mcg oral tablet, 500 mcg, PO, Daily    Vitamin D3, 1000 Unit(s), PO, Daily    Discharge Instructions    f/u with PCP in 1 week    f/u with ID as scheduled    Counseling    Face to Face    35 minutes time spent more than 50% time in coordinating care    [1] Admission H & Elon Alas MD, Augusto Gamble 11/24/2017 09:14 EDT    [2] Progress/SOAP Note; Smrithi Pigford MD, Daleen Bo 11/27/2017 11:48 EDT    [3] ID Follow Up note; Vickki Muff MD, Adam 11/27/2017 12:03 EDT    [4] CT Abdomen and Pelvis C+; Rayvon Char MD, Colm 11/24/2017 13:43 EDT    [5] XR Chest Single View; Marcella Dubs 11/25/2017 19:32 EDT    [6] Progress/SOAP Note; Loretha Ure MD, Daleen Bo 11/27/2017 11:48 EDT    SIGNATURE LINE Electronically signed by Einar Gip MD, Goble Fudala on 11/28/2017 at  11:16:23 EST

## 2017-11-24 NOTE — Progress Notes (Signed)
Subjective    pt seen and examined, chart reviewed, no events reported, denies abdominal  pain, N/V,    Review of Systems    12 systems reviewed negative    Objective    Vitals & Measurements    **T: **98.6 F  (Oral) **TMIN: **98.4 F  (Oral) **TMAX: **99.3 F  (Oral)  **HR: **89 (Peripheral Pulse) **HR: **93 **RR: **20 **BP: **133/82 **SpO2:  **96% **O2 Method (L/min): **Room air **WT: **77.6 Kg    Physical Exam    AOx3, NAD    heent: no jaundice , perrl    Heart: RRR    Lungs; good air entry, no wheezing    Abdomen: soft, non tender, non distended,    Ext: trace BL pedal edema,    neuro: no focal deficit,    Medications    _Inpatient_    acetaminophen tablet, 650 mg= 2 tab(s), PO, q4hr, PRN    albuterol 2.5 mg/3 mL (0.083%) inhalation solution, 2.5 mg= 3 mL, NEB, q4hr,  PRN    alteplase (catheter clearing), 2 mg, Dwell, Once    amitriptyline, 100 mg= 2 tab(s), PO, HS    Ativan, 0.5 mg= 1 tab(s), PO, q6hr, PRN    bacitracin 500 units/g topical ointment, 1 app, TOP, ud, PRN    budesonide 0.5 mg/2 mL inhalation suspension, 0.5 mg= 2 mL, NEB, BID    cefepime    Culturelle Digestive Health oral capsule, 1 cap(s), PO, BID    Cymbalta, 60 mg= 2 cap(s), PO, Daily    enoxaparin, 40 mg= 0.4 mL, sc, q24hr    folic acid, 1 mg= 1 tab(s), PO, Daily    formoterol 20 mcg/2 mL inhalation solution, 20 mcg= 2 mL, NEB, BID    glycopyrrolate 15.6 mcg inhalation capsule, 15.6 mcg= 1 cap(s), INHAL, BID    guaiFENesin, 100 mg= 5 mL, PO, q4hr, PRN    Latuda, 120 mg= 6 tab(s), PO, Daily    OLANZapine, 5 mg= 1 tab(s), PO, Daily    ondansetron, 4 mg= 2 mL, IV Push, q8hr, PRN    Protonix, 40 mg= 1 tab(s), PO, BID    Sodium Chloride 0.9% Flush, 10 mL, Flush, q12hr    Sodium Chloride 0.9% Flush, 10 mL, Flush, ud, PRN    traMADol, 50 mg= 1 tab(s), PO, q4hr, PRN    traZODone, 25 mg= 0.5 tab(s), PO, HS, PRN    vancomycin    Vitamin B12, 500 mcg= 0.5 tab(s), PO, Daily    Lab Results    Glucose Lvl: 79 mg/dL (16/10/96 04:54:09)    BUN: 7  mg/dL Low (81/19/14 78:29:56)    Creatinine: 0.559 mg/dL (21/30/86 57:84:69)    Afn Amer Glomerular Filtration Rate: >90 (11/25/17 06:18:00)    Non-Afn Amer Glomerular Filtration Rate: >90 (11/25/17 06:18:00)    Sodium Lvl: 143 mmol/L (11/25/17 06:18:00)    Potassium Lvl: 3.4 mmol/L Low (11/25/17 06:18:00)    Chloride: 111 mmol/L High (11/25/17 06:18:00)    CO2: 24 mmol/L (11/25/17 06:18:00)    Anion Gap: 8 (11/25/17 06:18:00)    Calcium Lvl: 7.4 mg/dL Low (62/95/28 41:32:44)    Magnesium Lvl: 1.9 mg/dL (07/06/70 53:66:44)    WBC: 8.5 thous/mm3 (11/25/17 06:18:00)    RBC: 2.15 Mil/mm3 Low (11/25/17 06:18:00)    Hgb: 7.2 Gm/dL Low (03/47/42 59:56:38)    Hct: 23.1 % Low (11/25/17 06:18:00)    Platelet: 311 thous/mm3 (11/25/17 06:18:00)    MCV: 107.4 fL High (11/25/17 06:18:00)    MCH: 33.5 pGm (11/25/17 06:18:00)  MCHC: 31.2 Gm/dL (30/16/01 09:32:35)    RDW-SD: 79.4 fL High (11/25/17 06:18:00)    MPV: 9.2 fL Low (11/25/17 06:18:00)    NRBC Percent: 0 % (11/25/17 06:18:00)    Absolute NRBC Count: 0 thous/mm3 (11/25/17 06:18:00)    C difficile Toxin B by PCR: C diff Not Detected (11/24/17 13:30:00)    Diagnostic Results                IMPRESSION:    1\.  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 This was  previously    assessed with lumbar spine MRI. Comparison with MRI is limited due to  difference    in imaging modality.    2\.  Hepatic steatosis.    3\.  Mild prominence of gastric mucosal folds in excluded portion of the  stomach    could represent gastritis but appears stable.    4\.  Bibasal atelectasis. Patchy consolidation left lower lobe suggests    superimposed pneumonia. Some nodularity is demonstrated along the left oblique    fissure. Consider follow-up chest CT after treatment to ensure resolution. [1]    Impression and Plan        This is a 67 year old female, with past medical history of COPD, hypertension,  diastolic congestive heart failure, PUD, MSSA bacteremia, recent diagnosis  and  treatment of lower extremity cellulitis, spinal osteomyelitis recent admission  discharged to rehabilitation with antibiotic treatment since March 2019 was  sent from rehabilitation due to fever hypertension and lethargy. He had one  episode of diarrhea on admission.        -Severe sepsis: Unclear etiology, empirically treated with IV vancomycin and cefepime, follow cultures, CT of the abdomen reviewed, appreciated ID consult        -Diarrhea: On admission, no repeated diarrhea reported, C. difficile is negative,        -History of diastolic heart failure: No sign of decompensation, resolved Lasix 40 mg by mouth daily        -Anemia: Chronic, no active bleeding reported recent normal iron studies vitamin B12 and folic acid, guaiac negative, continue monitor H&H and transfuse as needed        -COPD: No sign of wheezing on examination, continue when necessary nebulizer        =-Hypokalemia, replete and monitor    [1] CT Abdomen and Pelvis C+; Rayvon Char MD, Colm 11/24/2017 13:43 EDT    SIGNATURE LINE Electronically signed by Ruby Cola MD, Zionna Homewood (HOSP) on 11/25/2017  at 13:26:22 EST

## 2017-11-24 NOTE — Progress Notes (Signed)
Subjective    feels lot better, cough improved with mucinex, no fevers had BM yesterday.  denies pain    Review of Systems    Objective    Vitals & Measurements    **T: **97.5 F  (Oral) **TMIN: **97.3 F  (Oral) **TMAX: **98.3 F  (Oral)  **HR: **79 (Peripheral Pulse) **RR: **19 **BP: **102/65 **SpO2: **100% **O2  Method (L/min): **Room air    Physical Exam    alert oriented    heent atraumatic normocephalic    heart s1s2    lungs b/l airentry    abd soft and non tender    neuro at baseline    psych normal mood and behaviour [1]    Medications    _Inpatient_    acetaminophen tablet, 650 mg= 2 tab(s), PO, q4hr, PRN    albuterol 2.5 mg/3 mL (0.083%) inhalation solution, 2.5 mg= 3 mL, NEB, q4hr,  PRN    amitriptyline, 100 mg= 2 tab(s), PO, HS    Ativan, 0.5 mg= 1 tab(s), PO, q6hr, PRN    bacitracin 500 units/g topical ointment, 1 app, TOP, ud, PRN    bisacodyl, 10 mg= 2 tab(s), PO, Daily    bisacodyl, 10 mg= 1 supp, PR, Daily    budesonide 0.5 mg/2 mL inhalation suspension, 0.5 mg= 2 mL, NEB, BID    cefepime    Culturelle Digestive Health oral capsule, 1 cap(s), PO, BID    Cymbalta, 60 mg= 2 cap(s), PO, Daily    enoxaparin, 40 mg= 0.4 mL, sc, q24hr    Fleet Enema, 133 mL, PR, Daily    folic acid, 1 mg= 1 tab(s), PO, Daily    formoterol 20 mcg/2 mL inhalation solution, 20 mcg= 2 mL, NEB, BID    glycopyrrolate 15.6 mcg inhalation capsule, 15.6 mcg= 1 cap(s), INHAL, BID    guaiFENesin, 100 mg= 5 mL, PO, q4hr, PRN    KlonoPIN, 0.25 mg= 0.5 tab(s), PO, BID, PRN    Lasix, 40 mg= 1 tab(s), PO, Daily    Latuda, 120 mg= 6 tab(s), PO, Daily    Metamucil, 1 EA, PO, Daily    Milk of Magnesia 8% oral suspension, 2400 mg= 30 mL, PO, Daily    Mucinex, 600 mg= 1 tab(s), PO, q12hr    OLANZapine, 5 mg= 1 tab(s), PO, Daily    ondansetron, 4 mg= 2 mL, IV Push, q8hr, PRN    oxyCODONE, 5 mg= 1 tab(s), PO, q6hr, PRN    Protonix, 40 mg= 1 tab(s), PO, BID    Senokot S 50 mg-8.6 mg oral tablet, 1 tab(s), PO, BID    Sodium Chloride 0.9%  Flush, 10 mL, Flush, q12hr    Sodium Chloride 0.9% Flush, 10 mL, Flush, ud, PRN    traMADol, 50 mg= 1 tab(s), PO, q6hr, PRN    traZODone, 25 mg= 0.5 tab(s), PO, HS, PRN    vancomycin    Vitamin B12, 500 mcg= 0.5 tab(s), PO, Daily    Lab Results    Glucose Lvl: 83 mg/dL (16/10/96 04:54:09)    BUN: 9 mg/dL (81/19/14 78:29:56)    Creatinine: 0.562 mg/dL (21/30/86 57:84:69)    Afn Amer Glomerular Filtration Rate: >90 (11/27/17 07:23:00)    Non-Afn Amer Glomerular Filtration Rate: >90 (11/27/17 07:23:00)    Sodium Lvl: 143 mmol/L (11/27/17 07:23:00)    Potassium Lvl: 4 mmol/L (11/27/17 07:23:00)    Chloride: 112 mmol/L High (11/27/17 07:23:00)    CO2: 27 mmol/L (11/27/17 07:23:00)    Anion Gap: 4 (11/27/17 07:23:00)  Calcium Lvl: 7.4 mg/dL Low (16/10/96 04:54:09)    Magnesium Lvl: 1.7 mg/dL (81/19/14 78:29:56)    WBC: 6 thous/mm3 (11/27/17 07:23:00)    RBC: 2.1 Mil/mm3 Low (11/27/17 07:23:00)    Hgb: 7.1 Gm/dL Low (21/30/86 57:84:69)    Hct: 22.9 % Low (11/27/17 07:23:00)    Platelet: 318 thous/mm3 (11/27/17 07:23:00)    MCV: 109 fL High (11/27/17 07:23:00)    MCH: 33.8 pGm (11/27/17 07:23:00)    MCHC: 31 Gm/dL (62/95/28 41:32:44)    RDW-SD: 79.7 fL High (11/27/17 07:23:00)    MPV: 9 fL Low (11/27/17 07:23:00)    NRBC Percent: 0 % (11/27/17 07:23:00)    Absolute NRBC Count: 0 thous/mm3 (11/27/17 07:23:00)    Diagnostic Results    IMPRESSION:    1\.  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 This was  previously    assessed with lumbar spine MRI. Comparison with MRI is limited due to  difference    in imaging modality.    2\.  Hepatic steatosis.    3\.  Mild prominence of gastric mucosal folds in excluded portion of the  stomach    could represent gastritis but appears stable.    4\.  Bibasal atelectasis. Patchy consolidation left lower lobe suggests    superimposed pneumonia. Some nodularity is demonstrated along the left oblique    fissure. Consider follow-up chest CT after treatment to ensure  resolution. [2]    Impression and Plan    Febrile illness    Fever    This is a 67 year old female, with past medical history of COPD, hypertension,  diastolic congestive heart failure, PUD, MSSA bacteremia, recent diagnosis and  treatment of lower extremity cellulitis, spinal osteomyelitis recent admission  discharged to rehabilitation with antibiotic treatment since March 2019 was  sent from rehabilitation due to fever hypertension and lethargy, had one  episode of diarrhea on admission.        -Severe sepsis: pneumonia CT abd report shows patchy consolidation of left lower lobe suggests superimposed PNA, empirically on IV vancomycin and cefepime, cultures remain negative. MRSA swab pending    agree with ID recs        -Diarrhea: On admission, C. difficile is negative,        -History of diastolic heart failure: No sign of decompensation, resolved on Lasix 40 mg by mouth daily        -Anemia: acute on Chronic, anemia of chronic disease no active bleeding reported recent normal iron studies vitamin B12 and folic acid, guaiac negative, continue monitor H&H and transfuse as needed        -COPD: No sign of wheezing on examination, continue when necessary nebulizer        =-Hypokalemia, repleted [3]        Full code per [facility] form    [1] Progress/SOAP Note; Adams Hinch MD, Daleen Bo 11/26/2017 13:34 EDT    [2] CT Abdomen and Pelvis C+; Rayvon Char MD, Colm 11/24/2017 13:43 EDT    [3] Progress/SOAP Note; Contrina Orona MD, Daleen Bo 11/26/2017 13:34 EDT    SIGNATURE LINE Electronically signed by Einar Gip MD, Marry Kusch on 11/27/2017 at  12:08:00 EST

## 2017-11-24 NOTE — H&P (Signed)
Chief Complaint    fever    History of Present Illness    23F COPD, HTN, BPD, dCHF, PUD and recently tx'd for LE cellulitis, MSSA  bacteremia, and spinal osteo who p/w fever and hypoTN. Pt was admitted in  March for MSSA bacteremia, was found to have spinal osteo, and then again in  April for LLE cellulitis. She has been on long term abx (clinda/ancef) through  PICC line. Yesterday, she was sent from rehab due to fever, hypotension and  lethargy. Vital signs listed as BP 87/64 HR 103, RR 26 Temp 101.2. She tells  me now that she has had a few days of cough, sore throat and mild SOB. She  also notes mild LLQ pain, poor appetite, and loose stools for the last week.  No N/V. No CP. No UTI sx. No new rashes. No change in chronic back pain. No  new N/W/T. In the ER, she had an episode of diarrhea. She has rec'd  vanco/zosyn and IVF.    Review of Systems    The remainder of the ROS is neg except as described above    Code Status    MOLST 11/10/17--FULL    Physical Exam    Vitals & Measurements    **T: **98.8 F  (Oral) **TMIN: **98 F  (Oral) **TMAX: **100 F  (Oral) **HR:  **90 **RR: **21 **BP: **138/72 **SpO2: **96% **O2 Method (L/min): **Room air  **WT: **77 Kg    GEN: A+Ox3 comfortable non toxic appearing    HEENT: MM sl dry    NECK: no JVP    LUNGS: crackles L up 2/3 and R base. no wheeze or rhonchi. no acc mm use    CV: RRR no m/r/g    ABD: distended, mild LLQ TTP w/o R/G +BS    EXT: 1+ BLE    NEURO: moves all ext and follows commands    Impression and Plan    23F COPD, HTN, BPD, dCHF, PUD and recently tx'd for LE cellulitis, MSSA  bacteremia, and spinal osteo who p/w fever and hypoTN.        1\. Severe sepsis: suspicious for cdiff infection given LLQ pain and diarrhea.  recently tx'd for LE cellulitis, MSSA bacteremia, and spinal osteo still on  clinda and ancef. Echo 09/04/17 did not vegetations, but this was before the  high grade bacteremia. Also, recent urine and wound cx + for klebsiella pna  intermed sensi  to Zosyn, but sensitive to cefepime. CRP 22 4/24, 1.75 5/4, and  now 5.95. WBC 14-->11 with L shift. LA 2.2-->1.3. hemodyn stable. +diarrhea  and LLQ pain. also reports cough but CXR w/o pna. UA unremarkable.    -ID c/s    -Vanco/Cefepime    -stool for cdiff    -empiric po vanco until cdiff returns    -consider CT a/p    -consider MRI spine for f/u    -consider echo v TEE if +blood cx        2\. Anemia of chronic dz: Hgb 7.1-->7.5. recent iron, B12 and fol wnl. recent  guaiac neg. last PRBC 5/12. EGD 2018 esoph ulcer, but repeat improved. seen by  GI 5/15 who would like to do outpt EGD +/- cscope. stable    -transfuse Hgb < 7    -guaiac    -PPI BID        3\. Chronic dCHF: ntprobnp 4220 at baseline, CXR clear, not hypoxic but does  have crackles on exam and edema    -  hold lasix in setting of sepsis and hypoTN    -d/c IVF        4\. COPD: stable, continue home meds        5\. BPD/dep/anx: continue home meds        6\. Hypoalbuminemia: chronic, nutrition c/s        7\. CODE: MOLST 11/10/17--FULL    CMS Two-Midnight Rule    I certify that hospital inpatient services are reasonable and medically  necessary. They are appropriately provided as inpatient services in accordance  with the two midnight benchmark under 42CFR 412 3(e), or the services are  specified as inpatient only procedure under 42 CFR 419 22(n).          Problem List/Past Medical History    COPD    s/p Roux-en-Y gastric bypass    HTN    Dep/anx    BPD    GERD    Chronic dCHF EF 65 09/04/17    Chronic low back pain on opiates    H/o choledocholithiasis requiring percutaneous internal/external biliary drain  placement    PUD h/o esophageal ulcer    Recent MSSA bacteremia and spinal osteomyelitis    Social History    quit smoking, no ETOH or drugs    currently in rehab    Family History    High blood pressure: Father, Sister and Brother.    Allergies    NKA    Medications    _Inpatient_    albuterol 2.5 mg/3 mL (0.083%) inhalation solution, 2.5 mg= 3 mL, NEB,  q4hr,  PRN    amitriptyline, 100 mg= 2 tab(s), PO, HS    Ativan, 0.5 mg= 1 tab(s), PO, q6hr, PRN    budesonide 0.5 mg/2 mL inhalation suspension, 0.5 mg= 2 mL, NEB, BID    Cymbalta, 60 mg= 2 cap(s), PO, Daily    docusate, 100 mg= 1 cap(s), PO, BID, PRN    folic acid, 1 mg= 1 tab(s), PO, Daily    formoterol 20 mcg/2 mL inhalation solution, 20 mcg= 2 mL, NEB, BID    glycopyrrolate 15.6 mcg inhalation capsule, 15.6 mcg= 1 cap(s), INHAL, BID    Latuda, 120 mg= 6 tab(s), PO, Daily    OLANZapine, 5 mg= 1 tab(s), PO, Daily    Protonix, 40 mg= 1 tab(s), PO, BID    SEPSIS bolus NS 2,310 mL, 2310 mL, 30 mL/kg, IV    traMADol, 50 mg= 1 tab(s), PO, q4hr, PRN    Vitamin B12, 500 mcg= 0.5 tab(s), PO, Daily    _Home_    acetaminophen 325 mg oral tablet, 650 mg, PO, q4hr, PRN    albuterol 2.5 mg/3 mL (0.083%) inhalation solution, 2.5 mg= 3 mL, NEB, q4hr,  PRN    amitriptyline, 100 mg, PO, HS    Ativan 0.5 mg oral tablet, 0.5 mg= 1 tab(s), PO, q6hr, PRN, [id number], stop  12/01/2017    Breo Ellipta 100 mcg-25 mcg/inh inhalation powder, 1 puff(s), INHAL, Daily    ceFAZolin 2 g/100 mL-D5% injectable solution, See Instructions, 2 Gm IV q8hr  for 2 more weeks    clindamycin 600 mg/50 mL-D5% intravenous solution, 600 mg, IV, q8hr    Culturelle Digestive Health oral capsule, 1 cap(s), PO, BID    Cymbalta 60 mg oral delayed release capsule, 60 mg= 1 cap(s), PO, Daily    docusate sodium 100 mg oral capsule, 100 mg= 1 cap(s), PO, BID, PRN, 2 refills    folic acid 1 mg oral tablet, 1 mg= 1  tab(s), PO, Daily    furosemide 40 mg oral tablet, 40 mg, PO, BID    Latuda, 120 mg, PO, Daily    OLANZapine 5 mg oral tablet, disintegrating, 5 mg= 1 tab(s), PO, Daily    potassium chloride 20 mEq/15 mL oral liquid, 40 mEq= 30 mL, PO, TID    Protonix, 40 mg, PO, BID    Spiriva 18 mcg inhalation capsule, 18 mcg= 1 cap(s), INHAL, Daily    traMADol 50 mg oral tablet, See Instructions, 1 tab(s) PO q4hr PRN pain 5  day(s)    Vitamin B12 250 mcg oral tablet, 500  mcg, PO, Daily    Vitamin D3, 1000 Unit(s), PO, Daily    Lab Results    Glucose Lvl: 168 mg/dL High (87/56/43 32:95:18)    BUN: 7 mg/dL Low (84/16/60 63:01:60)    Creatinine: 0.895 mg/dL (10/93/23 55:73:22)    Afn Amer Glomerular Filtration Rate: 71 ml/min/1.36m2 (11/23/17 23:42:00)    Non-Afn Amer Glomerular Filtration Rate: 62 ml/min/1.69m2 (11/23/17 23:42:00)    Sodium Lvl: 142 mmol/L (11/24/17 06:04:00)    Potassium Lvl: 3.5 mmol/L Low (11/24/17 06:04:00)    Chloride: 110 mmol/L (11/24/17 06:04:00)    CO2: 23 mmol/L (11/24/17 06:04:00)    Anion Gap: 9 (11/24/17 06:04:00)    Total Protein: 5.6 Gm/dL Low (02/54/27 06:23:76)    Albumin Lvl: 1.4 Gm/dL Low (28/31/51 76:16:07)    Calcium Lvl: 7.4 mg/dL Low (37/10/62 69:48:54)    Phosphorus: 2.8 mg/dL (62/70/35 00:93:81)    Magnesium Lvl: 1.6 mg/dL Low (82/99/37 16:96:78)    Bilirubin Total: 0.3 mg/dL (93/81/01 75:10:25)    Alkaline Phosphatase: 140 Units/L High (11/23/17 23:42:00)    AST: 11 Units/L (11/23/17 23:42:00)    ALT: <6 (11/23/17 23:42:00)    Lipase Lvl: 34 Units/L Low (11/23/17 23:42:00)    Lactic Acid Lvl: 1.3 mmol/L (11/24/17 03:51:00)    Troponin I: <0.015 (11/23/17 23:42:00)    NT-ProBNP: 4220 pGm/ml High (11/23/17 23:42:00)    C-Reactive Protein: 5.95 mg/dL High (85/27/78 24:23:53)    WBC: 11.7 thous/mm3 High (11/24/17 06:04:00)    RBC: 2.21 Mil/mm3 Low (11/24/17 06:04:00)    Hgb: 7.5 Gm/dL Low (61/44/31 54:00:86)    Hct: 24.3 % Low (11/24/17 06:04:00)    Platelet: 307 thous/mm3 (11/24/17 06:04:00)    MCV: 110 fL High (11/24/17 06:04:00)    MCH: 33.9 pGm (11/24/17 06:04:00)    MCHC: 30.9 Gm/dL Low (76/19/50 93:26:71)    RDW-SD: 81.8 fL High (11/24/17 06:04:00)    MPV: 9 fL Low (11/24/17 06:04:00)    Absolute Neutro Count: 9.59 thous/mm3 High (11/24/17 06:04:00)    Absolute Lymphs Count: 1.11 thous/mm3 (11/24/17 06:04:00)    Absolute Mono Count: 0.91 thous/mm3 (11/24/17 06:04:00)    Absolute Eos Count: 0.06 thous/mm3 (11/24/17 06:04:00)    Absolute Baso  Count: 0.01 thous/mm3 (11/24/17 06:04:00)    Neutrophils: 81.6 % (11/24/17 06:04:00)    Lymphocytes: 9.5 % (11/24/17 06:04:00)    Monocytes: 7.8 % (11/24/17 06:04:00)    Eosinophils: 0.5 % (11/24/17 06:04:00)    Basophils: 0.1 % (11/24/17 06:04:00)    Immature Granulocytes: 0.5 % (11/24/17 06:04:00)    NRBC Percent: 0 % (11/24/17 06:04:00)    Absolute NRBC Count: 0 thous/mm3 (11/24/17 06:04:00)    Hypochromia: 1+ Abnormal (11/24/17 06:04:00)    Polychrom: 1+ Abnormal (11/23/17 23:42:00)    Anisocytosis: 1+ Abnormal (11/24/17 06:04:00)    Macrocytes: 1+ Abnormal (11/24/17 06:04:00)    Teardrops: 1+ Abnormal (11/23/17 23:42:00)    Platelet Morph: Enlarged  Abnormal (11/23/17 23:42:00)    UA Color: Yellow1 (11/24/17 02:13:00)    UA Clarity: Clear1 (11/24/17 02:13:00)    UA Specific Gravity: 1.014 (11/24/17 02:13:00)    UA pH: 6 (11/24/17 02:13:00)    UA Protein: Negative1 (11/24/17 02:13:00)    UA Glucose: Negative1 (11/24/17 02:13:00)    UA Ketones: Negative1 (11/24/17 02:13:00)    UA Bilirubin: Negative1 (11/24/17 02:13:00)    UA Blood: Negative1 (11/24/17 02:13:00)    UA Urobilinogen: Negative1 (11/24/17 02:13:00)    UA Nitrite: Negative1 (11/24/17 02:13:00)    UA Leukocyte Esterase: 75 Abnormal (11/24/17 02:13:00)    UA RBC: 1 /HPF (11/24/17 02:13:00)    UA WBC: 1 /HPF (11/24/17 02:13:00)    UA Squamous Epithelial: 3 /HPF (11/24/17 02:13:00)    UA Bacteria: Trace1 Abnormal (11/24/17 02:13:00)    UA Mucous: Few (11/24/17 02:13:00)    UA Budding Yeast: Trace1 Abnormal (11/24/17 02:13:00)    Blue Top Tube To Hold: DONE (11/23/17 23:44:00)    Diagnostic Results        Procedure: XR Chest Single View  11/23/2017 11:49 PM    Indications: Short of breath, Sepsis    Comparison: Chest x-ray dated 11/10/2017, MRI thoracic spine dated 09/05/2017, CT    chest dated 09/03/2017, CT chest report dated 08/20/2017, CT chest report dated    10/03/2014        TECHNIQUE:  Portable  AP chest radiograph(s), 1  view(s) obtained at 11/23/2017     11:49 PM. CPT 71010        FINDINGS:        Heart: Normal.    Mediastinum/Vessels: Mediastinum within normal limits; pulmonary vascularity  at    the upper most limits of normal.    Lungs/Pleural space:  Normal.    Bony thorax: Old healed right rib fracture deformities; scoliosis and    degenerative spondylosis of the thoracic spine.    Life support devices: None.    Other findings: None.        IMPRESSION:    No portable plain film evidence of acute cardiopulmonary disease. [1]            ECGREPT    SINUS RHYTHM    PRWP    Compared with prior tracing, loss of R voltage V4 may be due to lead location            PREVIOUS TRACING: 11/10/2017 12.49        Reconfirmed for order attachment    Physician Interpreter: [id number] Micheal Likens , M.D.    ECG HEART RATE: 92 /min    ECG RR INTERVAL: 647 ms    ECG P DURATION: 107 ms    ECG QRS DURATION: 96 ms    ECG PR INTERVAL: 166 ms    ECG QT INTERVAL: 367 ms    ECG QTC INTERVAL: 423 ms    ECG QT DISPERSION:  ms    ECG P AXIS: 49 deg    ECG QRS AXIS: -2 deg    ECG T AXIS: 53 deg    [2]        ------        [1] XR Chest Single View; Beckey Downing MD, Tasia Catchings 11/23/2017 23:54 EDT    [2] EKG; Ulyses Southward MD, Cliffton Asters 11/23/2017 23:30 EDT    SIGNATURE LINE Electronically signed by Marquita Palms MD, Shadrach Bartunek on 11/24/2017 at  13:42:35 EST

## 2017-11-25 LAB — HX BASIC METABOLIC PANEL
CASE NUMBER: 2019144000197
HX ANION GAP: 8 — NL (ref 3.0–11.0)
HX BUN: 7 mg/dL — ABNORMAL LOW (ref 8.0–23.0)
HX CALCIUM LVL: 7.4 mg/dL — ABNORMAL LOW (ref 8.5–10.5)
HX CHLORIDE: 111 mmol/L — ABNORMAL HIGH (ref 98.0–110.0)
HX CO2: 24 mmol/L — NL (ref 21.0–32.0)
HX CREATININE: 0.559 mg/dL — NL (ref 0.55–1.3)
HX GLUCOSE LVL: 79 mg/dL — NL (ref 70.0–110.0)
HX POTASSIUM LVL: 3.4 mmol/L — ABNORMAL LOW (ref 3.6–5.2)
HX SODIUM LVL: 143 mmol/L — NL (ref 136.0–146.0)

## 2017-11-25 LAB — HX URINE CULTURE
CASE NUMBER: 2019142003994
HX F: 60000

## 2017-11-25 LAB — HX VITAMIN B12 LEVEL
CASE NUMBER: 2019144000197
HX VITAMIN B12 LVL: 1729 pg/mL — ABNORMAL HIGH (ref 193.0–986.0)

## 2017-11-25 LAB — HX CBC W/ INDICES
CASE NUMBER: 2019144000197
HX ABSOLUTE NRBC COUNT: 0 10*3/uL
HX HCT: 23.1 % — ABNORMAL LOW (ref 36.0–47.0)
HX HGB: 7.2 g/dL — ABNORMAL LOW (ref 11.8–16.0)
HX MCH: 33.5 pg — NL (ref 26.0–34.0)
HX MCHC: 31.2 g/dL — NL (ref 31.0–37.0)
HX MCV: 107.4 fL — ABNORMAL HIGH (ref 80.0–100.0)
HX MPV: 9.2 fL — ABNORMAL LOW (ref 9.4–12.4)
HX NRBC PERCENT: 0 % — NL
HX PLATELET: 311 10*3/uL — NL (ref 150.0–400.0)
HX RBC: 2.15 10*6/uL — ABNORMAL LOW (ref 3.9–5.2)
HX RDW-CV: 20.4 % — ABNORMAL HIGH (ref 11.5–14.5)
HX RDW-SD: 79.4 fL — ABNORMAL HIGH (ref 35.0–51.0)
HX WBC: 8.5 10*3/uL — NL (ref 3.7–11.2)

## 2017-11-25 LAB — HX IRON PROFILE
CASE NUMBER: 18009447
HX % IRON BOUND: >100 — ABNORMAL HIGH (ref 10.0–50.0)
HX FERRITIN LVL: 1502 ng/mL — ABNORMAL HIGH (ref 11.0–307.0)
HX IRON: 38 ug/dL — NL (ref 30.0–160.0)
HX TIBC: 19 ug/dL — ABNORMAL LOW (ref 250.0–450.0)

## 2017-11-25 LAB — HX GLOMERULAR FILTRATION RATE (ESTIMATED)
CASE NUMBER: 2019144000197
HX AFN AMER GLOMERULAR FILTRATION RATE: >90
HX NON-AFN AMER GLOMERULAR FILTRATION RATE: >90

## 2017-11-25 LAB — HX FOLATE LEVEL
CASE NUMBER: 2019144000197
HX FOLATE LVL: >20 — NL

## 2017-11-25 LAB — HX MAGNESIUM LEVEL
CASE NUMBER: 2019144000197
HX MAGNESIUM LVL: 1.9 mg/dL — NL (ref 1.7–2.5)

## 2017-11-25 LAB — HX VANCOMYCIN LEVEL TROUGH
CASE NUMBER: 2019144002627
HX VANCOMYCIN TROUGH: 25.2 ug/mL — CR (ref 10.0–20.0)

## 2017-11-26 LAB — HX BASIC METABOLIC PANEL
CASE NUMBER: 2019145001357
HX ANION GAP: 7 — NL (ref 3.0–11.0)
HX BUN: 9 mg/dL — NL (ref 8.0–23.0)
HX CALCIUM LVL: 7.3 mg/dL — ABNORMAL LOW (ref 8.5–10.5)
HX CHLORIDE: 108 mmol/L — NL (ref 98.0–110.0)
HX CO2: 28 mmol/L — NL (ref 21.0–32.0)
HX CREATININE: 0.732 mg/dL — NL (ref 0.55–1.3)
HX GLUCOSE LVL: 111 mg/dL — ABNORMAL HIGH (ref 70.0–110.0)
HX POTASSIUM LVL: 3 mmol/L — ABNORMAL LOW (ref 3.6–5.2)
HX SODIUM LVL: 143 mmol/L — NL (ref 136.0–146.0)

## 2017-11-26 LAB — HX GLOMERULAR FILTRATION RATE (ESTIMATED)
CASE NUMBER: 2019145001357
HX AFN AMER GLOMERULAR FILTRATION RATE: >90
HX NON-AFN AMER GLOMERULAR FILTRATION RATE: 86 mL/min/{1.73_m2}

## 2017-11-26 LAB — HX MAGNESIUM LEVEL
CASE NUMBER: 2019145001357
HX MAGNESIUM LVL: 1.7 mg/dL — NL (ref 1.7–2.5)

## 2017-11-27 LAB — HX BASIC METABOLIC PANEL
CASE NUMBER: 2019147000005
HX ANION GAP: 4 — NL (ref 3.0–11.0)
HX BUN: 9 mg/dL — NL (ref 8.0–23.0)
HX CALCIUM LVL: 7.4 mg/dL — ABNORMAL LOW (ref 8.5–10.5)
HX CHLORIDE: 112 mmol/L — ABNORMAL HIGH (ref 98.0–110.0)
HX CO2: 27 mmol/L — NL (ref 21.0–32.0)
HX CREATININE: 0.562 mg/dL — NL (ref 0.55–1.3)
HX GLUCOSE LVL: 83 mg/dL — NL (ref 70.0–110.0)
HX POTASSIUM LVL: 4 mmol/L — NL (ref 3.6–5.2)
HX SODIUM LVL: 143 mmol/L — NL (ref 136.0–146.0)

## 2017-11-27 LAB — HX CBC W/ INDICES
CASE NUMBER: 2019146000249
HX ABSOLUTE NRBC COUNT: 0 10*3/uL
HX HCT: 22.9 % — ABNORMAL LOW (ref 36.0–47.0)
HX HGB: 7.1 g/dL — ABNORMAL LOW (ref 11.8–16.0)
HX MCH: 33.8 pg — NL (ref 26.0–34.0)
HX MCHC: 31 g/dL — NL (ref 31.0–37.0)
HX MCV: 109 fL — ABNORMAL HIGH (ref 80.0–100.0)
HX MPV: 9 fL — ABNORMAL LOW (ref 9.4–12.4)
HX NRBC PERCENT: 0 % — NL
HX PLATELET: 318 10*3/uL — NL (ref 150.0–400.0)
HX RBC: 2.1 10*6/uL — ABNORMAL LOW (ref 3.9–5.2)
HX RDW-CV: 19.9 % — ABNORMAL HIGH (ref 11.5–14.5)
HX RDW-SD: 79.7 fL — ABNORMAL HIGH (ref 35.0–51.0)
HX WBC: 6 10*3/uL — NL (ref 3.7–11.2)

## 2017-11-27 LAB — HX GLOMERULAR FILTRATION RATE (ESTIMATED)
CASE NUMBER: 2019147000005
HX AFN AMER GLOMERULAR FILTRATION RATE: >90
HX NON-AFN AMER GLOMERULAR FILTRATION RATE: >90

## 2017-11-27 LAB — HX MRSA CULTURE: CASE NUMBER: 2019144001494

## 2017-11-28 LAB — HX CBC W/ INDICES
CASE NUMBER: 2019147000005
HX ABSOLUTE NRBC COUNT: 0 10*3/uL
HX HCT: 23.3 % — ABNORMAL LOW (ref 36.0–47.0)
HX HGB: 7.2 g/dL — ABNORMAL LOW (ref 11.8–16.0)
HX MCH: 33.6 pg — NL (ref 26.0–34.0)
HX MCHC: 30.9 g/dL — ABNORMAL LOW (ref 31.0–37.0)
HX MCV: 108.9 fL — ABNORMAL HIGH (ref 80.0–100.0)
HX MPV: 9 fL — ABNORMAL LOW (ref 9.4–12.4)
HX NRBC PERCENT: 0 % — NL
HX PLATELET: 314 10*3/uL — NL (ref 150.0–400.0)
HX RBC: 2.14 10*6/uL — ABNORMAL LOW (ref 3.9–5.2)
HX RDW-CV: 19.9 % — ABNORMAL HIGH (ref 11.5–14.5)
HX RDW-SD: 79.7 fL — ABNORMAL HIGH (ref 35.0–51.0)
HX WBC: 5.8 10*3/uL — NL (ref 3.7–11.2)

## 2017-11-28 LAB — HX BLOOD CULTURE
CASE NUMBER: 2019142003995
CASE NUMBER: 2019142003996
HX F: NO GROWTH
HX F: NO GROWTH

## 2017-11-28 LAB — HX SST GOLD TUBE TO HOLD: CASE NUMBER: 2019147000005

## 2017-12-06 ENCOUNTER — Ambulatory Visit

## 2017-12-12 ENCOUNTER — Ambulatory Visit: Admitting: Infectious Disease

## 2017-12-12 NOTE — Progress Notes (Signed)
* * *        **  Leah [name]    --- ---    86 Y old Female, DOB: 08-25-1950    [address] [place], [place] [address]    Home: [phone]    Provider: Rosalio Loud        * * *    Telephone Encounter    ---    Answered by   Britta Mccreedy  Date: 12/12/2017         Time: 03:26 PM    Caller   [facility]    --- ---            Reason   Call Back, labs & stool            Message                      Amil Amen from [facility] called and would like a call back regarding this patient and her IV antibiotic. When you call back please have the nurse paged (JUDY) [phone]                Action Taken                      Benjamine Sprague 12/12/2017 3:43:04 PM > c/o diarrhea the last few days and pt refusing meds. They drew labs today and will submit a sample when they can.       Charbonneau,Samantha L 12/13/2017 7:50:11 AM > labs rcvd.       Charbonneau,Samantha L 12/14/2017 9:06:05 AM > stool rcvd                    * * *                ---          * * *          Patient: Leah, [name] DOB: Sep 27, 1950 Provider: Rosalio Loud  12/12/2017    ---    Note generated by eClinicalWorks EMR/PM Software ([email or web address])

## 2017-12-14 ENCOUNTER — Ambulatory Visit

## 2017-12-20 ENCOUNTER — Ambulatory Visit

## 2017-12-20 ENCOUNTER — Ambulatory Visit: Admitting: Infectious Disease

## 2017-12-20 NOTE — Progress Notes (Signed)
* * *        Leah [name]**    --- ---    72 Y old Female, DOB: 03/16/1951    Account Number: [id number]    [address], [address], [place], [place]    Home: [phone]    Guarantor: Leah [name] Insurance: [employer]    PCP: Blanchie Serve    Appointment Facility: [facility]        * * *    12/20/2017  Progress Notes: Vicenta Aly, MD    --- ---    ---         **Reason for Appointment**    ---       1\. APPT BOOKED W/ [facility]YSSA AT [facility] [phone] [id number] / SEPSIS;r/s  from 4/3 due to pt in-patient    ---       **History of Present Illness**    ---     _History_ :    A: This is a 67 year old female with a history of dCHF, CKD, COPD who  presented initially on 3/3 with severe sepsis with likely toxic metabolic  encephalopathy with high grade MSSA bacteremia, lower lumbar spinal  osteomyelitis, epidural abscess, and psoas abscess but nothing threatening  central nerves on MRI. TTE without clear endocarditis. No signs of infection  in T or C spine. Entry point may be from multiple wounds on LE. Blood cultures  negative on 3/4. Her course has been complicated by LLE cellulitis and most  recently R pneumonia so currently on IV cefepime.    She now returns for follow up. Has been on some form of IV antibiotics now for  over 12 weeks. Notes some chronic but unchanged pain in the lower back with  some radiation to anterior thighs but this has not worsened or changed  recently. LE swelling much improved. No N/V, noting some inttermittent  diarrhea but c.diff negative x 2. The rest of the ROS is negative.       **Current Medications**    ---    Taking     * cefepime 1 g powder for injection as directed intravenously every 12 hours    ---    * acetaminophen     ---    * albuterol 2.5 mg/3 mL (0.083%) solution 3 mL by nebulizer every 6 hours    ---    * amitriptyline 100 mg tablet 1 tab(s) orally once a day (at bedtime)    ---    * Ativan 0.5 mg tablet 1 tab(s) orally 3 times a  day    ---    * Breo Ellipta 100 mcg-25 mcg/inh powder 1 puff(s) inhaled once a day    ---    * cholecalciferol 1000 intl units capsule 1 cap(s) orally once a day    ---    * culturelle capsule     ---    * cyanocobalamin 50 mcg tablet 1 tab(s) orally once a day    ---    * Cymbalta 60 mg delayed release capsule 1 cap(s) orally once a day    ---    * folic acid 1 mg tablet 1 tab(s) orally once a day    ---    * furosemide 40 mg tablet 1 tab(s) orally once a day    ---    * guaifenesin 600 mg tablet, extended release 1 tab(s) orally every 12 hours    ---    *  Latuda 120 mg tablet 1 tab(s) orally once a day    ---    * olanzapine 5 mg tablet 1 tab(s) orally once a day    ---    * oxycodone 5 mg tablet 1 tab(s) orally every 6 hours    ---    * potassium chloride 20 mEq/100 mL solution as directed intravenously once    ---    * Protonix 40 mg delayed release tablet 1 tab(s) orally once a day    ---    * tiotropium 18 mcg capsule 1 cap(s) inhaled once a day    ---    * Medication List reviewed and reconciled with the patient    ---       **Past Medical History**    ---       Osteomyelitis.        ---    Sepsis.        ---    Cellulitis - recurrent.        ---    Pneumonia.        ---    UTI.        ---    Bipolar disorder.        ---    Myositis.        ---    Kidney disease.        ---       **Family History**    ---       Non-Contributory    ---       **Social History**    ---    Recr. drug use    Do you or have you ever used drugs? _No_    Alcohol Consumption    Do you consume alcohol? _No_    Tobacco Use (MU)    Smoking Status: _Former Smoker_       **Allergies**    ---       N.K.D.A.    ---       **Vital Signs**    ---    Temp 98.4 F, HR 128, BP 101/72, O2 Sat 98, Weight wheelchair, Taken By: mo    labs due 6/17.       **Examination**    ---     Denton Meek Examination_ :    General Appearance: alert and focused on conversation.    HEENT: no conjunctival injection, sclera non-icteric.    Oral cavity: no pharyngeal  erythema or exudate noted.    Neck, Thyroid : non-tender, no cervical or supraclavicular lymphadenopathy,  supple.    Heart RSR, normal S1S2, no S3 or murmurs appreciated.    Lungs: clear to auscultation, normal, good air exchange.    abdomen soft, NT/ND, BS present.    Neurologic Exam: alert and oriented, no focal signs on gross inspection.    Skin: warm and dry, good turgor.    Back:  Mild lower lumbar central spinous tenderness.    Extremities: no appreciable nailbed clubbing, cyanosis, or edema, R arm PICC  site benign, no erythema or phlebitic cord, no distal arm edema.    CRP 6/17: 6.4 (nl<10).           **Assessments**    ---    1\. 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 Osteomyelitis of lumbar spine**    Stop cefepime powder for injection, 1 g, as directed,  intravenously, every 12  hours    Notes:    She has a normal CRP after over 12 weeks of antibiotics, indicating that any  infection is likely resolved and that her remaining pain is due to the damage  that was done by the infection and not active infection now. As such, will  stop antibiotics and remove PICC and monitor. Follow up in 4-6 weeks.    .    ---      **Follow Up**    ---    6 Weeks    Electronically signed by Vicenta Aly , MD on 12/22/2017 at 02:46 PM EDT    Sign off status: Completed        * * [facility] ([facility])    [address]    [place], [place] [id number]    Tel: [phone]    Fax: [phone]              * * *          Patient: Leah [name], Leah [name] DOB: [birth year] Progress Note: Vicenta Aly, MD  12/20/2017    ---    Note generated by eClinicalWorks EMR/PM Software ([email or web address])

## 2018-01-31 ENCOUNTER — Ambulatory Visit (HOSPITAL_BASED_OUTPATIENT_CLINIC_OR_DEPARTMENT_OTHER)

## 2018-01-31 ENCOUNTER — Ambulatory Visit: Admitting: Infectious Disease

## 2018-01-31 NOTE — Progress Notes (Signed)
* * *        Leah [name]**    --- ---    65 Y old Female, DOB: Dec 23, 1950    Account Number: [id number]    [address], [address], [place], [place]    Home: [phone]    Guarantor: Leah [name] Insurance: [employer]    PCP: Burney Gauze PHO, MD    Appointment Facility: [facility] ([facility]        * * *    01/31/2018  Infectious Diseases Progress Note: Vicenta Aly, MD    --- ---    ---         **Reason for Appointment**    ---       1\. 6 wk f/u    ---       **History of Present Illness**    ---     _History_ :    A: This is a 67 year old female with a history of dCHF, CKD, COPD who  presented initially on 3/3 with severe sepsis with likely toxic metabolic  encephalopathy with high grade MSSA bacteremia, lower lumbar spinal  osteomyelitis, epidural abscess, and psoas abscess but nothing threatening  central nerves on MRI. TTE without clear endocarditis. No signs of infection  in T or C spine. Entry point may be from multiple wounds on LE. Blood cultures  negative on 3/4. Her course has been complicated by LLE cellulitis and most  recently R pneumonia so currently on IV cefepime.    She now returns for follow up. Completed at least 12 weeks of IV antibiotics  with normalization of inflammatory markers. Has now been off the antibiotics  for about 6 weeks. Notes some chronic but unchanged pain in the lower back  with some radiation to anterior thighs but this has not worsened or changed  recently. No N/V/D. No fevers or chills. The rest of the ROS is negative.       **Current Medications**    ---    Taking     * acetaminophen     ---    * albuterol 2.5 mg/3 mL (0.083%) solution 3 mL by nebulizer every 6 hours    ---    * amitriptyline 100 mg tablet 1 tab(s) orally once a day (at bedtime)    ---    * Ativan 0.5 mg tablet 1 tab(s) orally 3 times a day    ---    * Breo Ellipta 100 mcg-25 mcg/inh powder 1 puff(s) inhaled once a day    ---    * cholecalciferol 1000 intl units  capsule 1 cap(s) orally once a day    ---    * culturelle capsule     ---    * cyanocobalamin 50 mcg tablet 1 tab(s) orally once a day    ---    * Cymbalta 60 mg delayed release capsule 1 cap(s) orally once a day    ---    * folic acid 1 mg tablet 1 tab(s) orally once a day    ---    * furosemide 40 mg tablet 1 tab(s) orally once a day    ---    * guaifenesin 600 mg tablet, extended release 1 tab(s) orally every 12 hours    ---    * Latuda 120 mg tablet 1 tab(s) orally once a day    ---    * olanzapine 5 mg tablet 1 tab(s) orally once a day    ---    *  oxycodone 5 mg tablet 1 tab(s) orally every 6 hours    ---    * potassium chloride 20 mEq/100 mL solution as directed intravenously once    ---    * Protonix 40 mg delayed release tablet 1 tab(s) orally once a day    ---    * tiotropium 18 mcg capsule 1 cap(s) inhaled once a day    ---    * tramadol 50 mg tablet 1 tab(s) orally every 4 hours    ---    * Medication List reviewed and reconciled with the patient    ---       **Past Medical History**    ---       Osteomyelitis.        ---    Sepsis.        ---    Cellulitis - recurrent.        ---    Pneumonia.        ---    UTI.        ---    Bipolar disorder.        ---    Myositis.        ---    Kidney disease.        ---       **Surgical History**    ---       No Surgical History documented.    ---       **Family History**    ---       No Family History documented.    ---       **Social History**    ---    Recr. drug use    Do you or have you ever used drugs? _No_    Alcohol Consumption    Do you consume alcohol? _No_    Tobacco Use (MU)    Smoking Status: _Former Smoker_       **Allergies**    ---       N.K.D.A.    ---       **Hospitalization/Major Diagnostic Procedure**    ---       No Hospitalization History.    ---       **Vital Signs**    ---    Temp 98.7 F, HR 105, BP 117/77, O2 Sat 99, Weight wheelchair, Taken By: mo.       **Examination**    ---     Denton Meek Examination_ :    General Appearance: alert and  focused on conversation.    HEENT: no conjunctival injection, sclera non-icteric.    Oral cavity: no pharyngeal erythema or exudate noted.    Neck, Thyroid : non-tender, no cervical or supraclavicular lymphadenopathy,  supple.    Heart RSR, normal S1S2, no S3 or murmurs appreciated.    Lungs: clear to auscultation, normal, good air exchange.    abdomen soft, NT/ND, BS present.    Neurologic Exam: alert and oriented, no focal signs on gross inspection.    Skin: warm and dry, good turgor.    Back:  Mild lower lumbar central spinous tenderness.    Extremities: no appreciable nailbed clubbing, cyanosis, or edema, R arm PICC  site benign, no erythema or phlebitic cord, no distal arm edema.    CRP 6/17: 6.4 (nl<10).           **Assessments**    ---    1\. 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 Osteomyelitis of lumbar spine**    Notes:    She has a normal CRP after over 12 weeks of antibiotics, indicating that any  infection is likely resolved and that her remaining pain is due to the damage  that was done by the infection and not active infection now. No clear signs of  recurrence off antibiotics now for 6 weeks which bodes well. No further  antibiotics, lab work, or ID follow up needed at this time.    .    ---      **Follow Up**    ---    prn    Electronically signed by Vicenta Aly , MD on 02/02/2018 at 12:37 PM EDT    Sign off status: Completed        * * [facility] ([facility]    [address]    [place], [place] [id number]    Tel: [phone]    Fax: [phone]              * * *          Patient: Leah [name], Leah [name] DOB: 07-07-1950 Progress Note: Vicenta Aly, MD  01/31/2018    ---    Note generated by eClinicalWorks EMR/PM Software ([email or web address])

## 2018-03-09 ENCOUNTER — Ambulatory Visit (INDEPENDENT_AMBULATORY_CARE_PROVIDER_SITE_OTHER): Payer: Medicare Other

## 2018-03-09 ENCOUNTER — Other Ambulatory Visit: Payer: Self-pay | Admitting: Family Medicine

## 2018-03-09 ENCOUNTER — Encounter: Payer: Self-pay | Admitting: Family Medicine

## 2018-03-09 ENCOUNTER — Telehealth: Payer: Self-pay | Admitting: Family Medicine

## 2018-03-09 ENCOUNTER — Ambulatory Visit (INDEPENDENT_AMBULATORY_CARE_PROVIDER_SITE_OTHER): Payer: Medicare Other | Admitting: Family Medicine

## 2018-03-09 VITALS — BP 133/86 | HR 93 | Temp 98.5°F | Ht 62.0 in | Wt 164.0 lb

## 2018-03-09 DIAGNOSIS — J449 Chronic obstructive pulmonary disease, unspecified: Secondary | ICD-10-CM | POA: Diagnosis not present

## 2018-03-09 DIAGNOSIS — H259 Unspecified age-related cataract: Secondary | ICD-10-CM

## 2018-03-09 DIAGNOSIS — J411 Mucopurulent chronic bronchitis: Secondary | ICD-10-CM

## 2018-03-09 DIAGNOSIS — K219 Gastro-esophageal reflux disease without esophagitis: Secondary | ICD-10-CM | POA: Diagnosis not present

## 2018-03-09 DIAGNOSIS — I509 Heart failure, unspecified: Secondary | ICD-10-CM

## 2018-03-09 DIAGNOSIS — G894 Chronic pain syndrome: Secondary | ICD-10-CM | POA: Diagnosis not present

## 2018-03-09 NOTE — Progress Notes (Signed)
Subjective:  Patient ID: Brittany Conrad, female    DOB: 04/26/1951  Age: 67 y.o. MRN: [id number]  CC: Establish Care   HPI Brittany Conrad presents for new patient evaluation. Pt. Has multiple conditions including bipolar dx for which she sees psychaitry. She is new in the area and needs a referral. She also has COPD and CHF with Chronic dyspnea as a result. She uses a bBreo inhaler. It helps a little. Pt. Has aleady established with an ophthalmologist due to cataract. She has a consultation scheduled for preop eval. On Sept. 9.    Pt. Also has chronic back pain and needs to be referred to pain clinic to renew her oxycodone. She has been out of the medication for several weeks and pain is severe. She has been diagnose with scoliosis and spondylosis as wel as having had four infections in her back.   Depression screen PHQ 2/9 03/09/2018  Decreased Interest 0  Down, Depressed, Hopeless 0  PHQ - 2 Score 0    History Brittany Conrad has a past medical history of Asthma, COPD (chronic obstructive pulmonary disease) ([place]), Emphysema lung ([place]), and Scoliosis.   She has a past surgical history that includes Abdominal hysterectomy; Gallbladder surgery; Breast reduction surgery; tummy tuck; and Back surgery.   Her family history includes Cancer in her father and mother.She reports that she has quit smoking. She has never used smokeless tobacco. Her alcohol and drug histories are not on file.    ROS Review of Systems  Constitutional: Negative.   HENT: Negative for congestion.   Eyes: Positive for visual disturbance (cataract, right eye).  Respiratory: Positive for shortness of breath and wheezing.   Cardiovascular: Negative for chest pain.  Gastrointestinal: Positive for abdominal pain (on PPI for chronic dyspepsia). Negative for constipation, diarrhea, nausea and vomiting.  Genitourinary: Negative for difficulty urinating.  Musculoskeletal: Positive for arthralgias, back pain, gait problem and myalgias.   Neurological: Negative for headaches.  Psychiatric/Behavioral: Negative for sleep disturbance.    Objective:  BP 133/86   Pulse 93   Temp 98.5 F (36.9 C) (Oral)   Ht '5\' 2"'  (1.575 m)   Wt 164 lb (74.4 kg)   BMI 30.00 kg/m   BP Readings from Last 3 Encounters:  03/09/18 133/86    Wt Readings from Last 3 Encounters:  03/09/18 164 lb (74.4 kg)     Physical Exam  Constitutional: She is oriented to person, place, and time. She appears well-developed and well-nourished. No distress.  Appears chronically ill.  HENT:  Head: Normocephalic and atraumatic.  Right Ear: External ear normal.  Left Ear: External ear normal.  Nose: Nose normal.  Mouth/Throat: Oropharynx is clear and moist.  Eyes: Pupils are equal, round, and reactive to light. Conjunctivae and EOM are normal.  Neck: Normal range of motion. Neck supple. No thyromegaly present.  Cardiovascular: Normal rate, regular rhythm and normal heart sounds.  No murmur heard. Pulmonary/Chest: Effort normal. No respiratory distress. She has wheezes (throughout with fair exchange). She has no rales.  Abdominal: Soft. Bowel sounds are normal. She exhibits no distension. There is no tenderness.  Lymphadenopathy:    She has no cervical adenopathy.  Neurological: She is alert and oriented to person, place, and time. She has normal reflexes.  Skin: Skin is warm and dry.  Psychiatric: She has a normal mood and affect. Her behavior is normal. Judgment and thought content normal.      Assessment & Plan:   Brittany Conrad was seen today for establish care.  Diagnoses and all orders for this visit:  Mucopurulent chronic bronchitis ([place]) -     CBC with Differential/Platelet -     CMP14+EGFR -     Cancel: DG Chest 2 View; Future  Chronic congestive heart failure, unspecified heart failure type ([place]) -     Brain natriuretic peptide -     Lipid panel  Chronic pain syndrome -     Ambulatory referral to Pain Clinic -     Ambulatory referral  to Psychiatry  Gastroesophageal reflux disease, esophagitis presence not specified  Senile cataract of right eye, unspecified age-related cataract type       I am having Brittany Conrad maintain her oxycodone, LORazepam, and albuterol.  Allergies as of 03/09/2018   No Known Allergies     Medication List        Accurate as of 03/09/18 11:59 PM. Always use your most recent med list.          albuterol 0.63 MG/3ML nebulizer solution Commonly known as:  ACCUNEB Inhale into the lungs every 4 (four) hours as needed.   amitriptyline 150 MG tablet Commonly known as:  ELAVIL Take 150 mg by mouth at bedtime.   docusate sodium 100 MG capsule Commonly known as:  COLACE Take 100 mg by mouth daily as needed for mild constipation.   DULoxetine 60 MG capsule Commonly known as:  CYMBALTA Take 60 mg by mouth daily.   fluticasone furoate-vilanterol 100-25 MCG/INH Aepb Commonly known as:  BREO ELLIPTA Inhale 1 puff into the lungs daily.   folic acid 1 MG tablet Commonly known as:  FOLVITE Take 1 mg by mouth daily.   furosemide 40 MG tablet Commonly known as:  LASIX Take by mouth 2 (two) times daily.   guaiFENesin 600 MG 12 hr tablet Commonly known as:  MUCINEX Take by mouth 2 (two) times daily.   LORazepam 0.5 MG tablet Commonly known as:  ATIVAN Take 0.5 mg by mouth 2 (two) times daily.   Lurasidone HCl 120 MG Tabs Take 120 mg by mouth daily.   OLANZapine 5 MG tablet Commonly known as:  ZYPREXA Take 5 mg by mouth daily.   oxycodone 5 MG capsule Commonly known as:  OXY-IR Take 5 mg by mouth every 6 (six) hours as needed.   pantoprazole 40 MG tablet Commonly known as:  PROTONIX Take 40 mg by mouth daily.   potassium chloride 20 MEQ/15ML (10%) Soln Take by mouth. Give 35ms TID   traMADol 50 MG tablet Commonly known as:  ULTRAM Take 50 mg by mouth every 6 (six) hours as needed. for pain   Vitamin D3 1000 units Caps Take by mouth daily.       MEds renewed,  except for opiate.  Follow-up: Return in about 3 months (around 06/08/2018).  WClaretta Fraise M.D.

## 2018-03-09 NOTE — Telephone Encounter (Signed)
Please advise on scripts for patient.

## 2018-03-10 ENCOUNTER — Other Ambulatory Visit: Payer: Self-pay

## 2018-03-10 ENCOUNTER — Encounter: Payer: Self-pay | Admitting: Family Medicine

## 2018-03-10 ENCOUNTER — Other Ambulatory Visit: Payer: Self-pay | Admitting: Family Medicine

## 2018-03-10 LAB — CMP14+EGFR
A/G RATIO: 1.4 (ref 1.2–2.2)
ALT: 13 IU/L (ref 0–32)
AST: 17 IU/L (ref 0–40)
Albumin: 3.9 g/dL (ref 3.6–4.8)
Alkaline Phosphatase: 162 IU/L — ABNORMAL HIGH (ref 39–117)
BUN / CREAT RATIO: 19 (ref 12–28)
BUN: 18 mg/dL (ref 8–27)
Bilirubin Total: 0.5 mg/dL (ref 0.0–1.2)
CALCIUM: 9.1 mg/dL (ref 8.7–10.3)
CO2: 18 mmol/L — ABNORMAL LOW (ref 20–29)
Chloride: 107 mmol/L — ABNORMAL HIGH (ref 96–106)
Creatinine, Ser: 0.96 mg/dL (ref 0.57–1.00)
GFR, EST AFRICAN AMERICAN: 71 mL/min/{1.73_m2} (ref >59)
GFR, EST NON AFRICAN AMERICAN: 62 mL/min/{1.73_m2} (ref >59)
GLOBULIN, TOTAL: 2.8 g/dL (ref 1.5–4.5)
Glucose: 89 mg/dL (ref 65–99)
POTASSIUM: 5.6 mmol/L — AB (ref 3.5–5.2)
SODIUM: 140 mmol/L (ref 134–144)
TOTAL PROTEIN: 6.7 g/dL (ref 6.0–8.5)

## 2018-03-10 LAB — CBC WITH DIFFERENTIAL/PLATELET
BASOS: 0 %
Basophils Absolute: 0 10*3/uL (ref 0.0–0.2)
EOS (ABSOLUTE): 0.1 10*3/uL (ref 0.0–0.4)
EOS: 1 %
HEMATOCRIT: 26.1 % — AB (ref 34.0–46.6)
Hemoglobin: 8.8 g/dL — CL (ref 11.1–15.9)
IMMATURE GRANS (ABS): 0 10*3/uL (ref 0.0–0.1)
IMMATURE GRANULOCYTES: 1 %
Lymphocytes Absolute: 1 10*3/uL (ref 0.7–3.1)
Lymphs: 13 %
MCH: 32.4 pg (ref 26.6–33.0)
MCHC: 33.7 g/dL (ref 31.5–35.7)
MCV: 96 fL (ref 79–97)
MONOCYTES: 7 %
Monocytes Absolute: 0.6 10*3/uL (ref 0.1–0.9)
NEUTROS PCT: 78 %
Neutrophils Absolute: 6.2 10*3/uL (ref 1.4–7.0)
Platelets: 327 10*3/uL (ref 150–450)
RBC: 2.72 x10E6/uL — CL (ref 3.77–5.28)
RDW: 13.7 % (ref 12.3–15.4)
WBC: 7.9 10*3/uL (ref 3.4–10.8)

## 2018-03-10 LAB — LIPID PANEL
CHOL/HDL RATIO: 3.3 ratio (ref 0.0–4.4)
Cholesterol, Total: 188 mg/dL (ref 100–199)
HDL: 57 mg/dL (ref >39)
LDL Calculated: 89 mg/dL (ref 0–99)
Triglycerides: 210 mg/dL — ABNORMAL HIGH (ref 0–149)
VLDL Cholesterol Cal: 42 mg/dL — ABNORMAL HIGH (ref 5–40)

## 2018-03-10 LAB — BRAIN NATRIURETIC PEPTIDE: BNP: 95.7 pg/mL (ref 0.0–100.0)

## 2018-03-10 MED ORDER — GUAIFENESIN ER 600 MG PO TB12: 1200.0000 mg | ORAL_TABLET | Freq: Two times a day (BID) | ORAL | 11 refills | Status: DC

## 2018-03-10 MED ORDER — AMITRIPTYLINE HCL 150 MG PO TABS: 150.0000 mg | ORAL_TABLET | Freq: Every day | ORAL | 1 refills | Status: DC

## 2018-03-10 MED ORDER — POTASSIUM CHLORIDE 20 MEQ/15ML (10%) PO SOLN: ORAL | 5 refills | Status: DC

## 2018-03-10 MED ORDER — OLANZAPINE 5 MG PO TABS: 5.0000 mg | ORAL_TABLET | Freq: Every day | ORAL | 1 refills | Status: DC

## 2018-03-10 MED ORDER — FUROSEMIDE 40 MG PO TABS: 40.0000 mg | ORAL_TABLET | Freq: Two times a day (BID) | ORAL | 5 refills | Status: DC

## 2018-03-10 MED ORDER — TRAMADOL HCL 50 MG PO TABS: 50.0000 mg | ORAL_TABLET | Freq: Four times a day (QID) | ORAL | 0 refills | Status: DC | PRN

## 2018-03-10 MED ORDER — FOLIC ACID 1 MG PO TABS: 1.0000 mg | ORAL_TABLET | Freq: Every day | ORAL | 11 refills | Status: DC

## 2018-03-10 MED ORDER — VITAMIN D3 25 MCG (1000 UT) PO CAPS: 1000.0000 [IU] | ORAL_CAPSULE | Freq: Every day | ORAL | 11 refills | Status: DC

## 2018-03-10 MED ORDER — PANTOPRAZOLE SODIUM 40 MG PO TBEC: 40.0000 mg | DELAYED_RELEASE_TABLET | Freq: Every day | ORAL | 3 refills | Status: DC

## 2018-03-10 MED ORDER — LURASIDONE HCL 120 MG PO TABS: 120.0000 mg | ORAL_TABLET | Freq: Every day | ORAL | 1 refills | Status: DC

## 2018-03-10 MED ORDER — DOCUSATE SODIUM 100 MG PO CAPS: 100.0000 mg | ORAL_CAPSULE | Freq: Every day | ORAL | 11 refills | Status: DC | PRN

## 2018-03-10 MED ORDER — DULOXETINE HCL 60 MG PO CPEP: 60.0000 mg | ORAL_CAPSULE | Freq: Every day | ORAL | 5 refills | Status: DC

## 2018-03-10 MED ORDER — POTASSIUM CHLORIDE 20 MEQ/15ML (10%) PO SOLN: 20.0000 meq | Freq: Every day | ORAL | 5 refills | Status: DC

## 2018-03-10 NOTE — Telephone Encounter (Signed)
No answer, no voicemail.

## 2018-03-10 NOTE — Telephone Encounter (Signed)
I sent in the requested prescription 

## 2018-03-12 NOTE — Addendum Note (Signed)
Addended by: Mechele Claude on: 03/12/2018 03:04 PM   Modules accepted: Level of Service

## 2018-03-14 ENCOUNTER — Other Ambulatory Visit: Payer: Self-pay | Admitting: Family Medicine

## 2018-03-14 LAB — IRON AND TIBC
Iron Saturation: 18 % (ref 15–55)
Iron: 35 ug/dL (ref 27–139)
TIBC: 193 ug/dL — AB (ref 250–450)
UIBC: 158 ug/dL (ref 118–369)

## 2018-03-14 LAB — SPECIMEN STATUS REPORT

## 2018-03-14 LAB — FERRITIN: FERRITIN: 1836 ng/mL — AB (ref 15–150)

## 2018-03-14 MED ORDER — ALBUTEROL SULFATE (2.5 MG/3ML) 0.083% IN NEBU: 2.5000 mg | INHALATION_SOLUTION | Freq: Four times a day (QID) | RESPIRATORY_TRACT | 1 refills | Status: DC | PRN

## 2018-03-14 MED ORDER — FLUTICASONE FUROATE-VILANTEROL 200-25 MCG/INH IN AEPB: 1.0000 | INHALATION_SPRAY | Freq: Every day | RESPIRATORY_TRACT | 11 refills | Status: DC

## 2018-03-14 MED ORDER — ARIPIPRAZOLE 10 MG PO TABS: 10.0000 mg | ORAL_TABLET | Freq: Every day | ORAL | 1 refills | Status: DC

## 2018-03-14 NOTE — Telephone Encounter (Signed)
I do not recommend olanzepine (Zyprexa) with abilify. I can increase the Zyprexa or discontinue it and start abilify.

## 2018-03-14 NOTE — Telephone Encounter (Signed)
Patient states.  She would like to discontinue Zyprexa and start abilify.  Also states that she would like a referral to psychiatry if possible

## 2018-03-14 NOTE — Telephone Encounter (Signed)
Please contact the patient have her start Abilify instead of Zyprexa the next time she would normally take Zyprexa.  A psychiatry consult was ordered on September 5 and is in the works.  Thanks, WS

## 2018-03-14 NOTE — Telephone Encounter (Signed)
Brittany Conrad was too expensive would like to go on Abilify Needs Rx for albuterol neb sol & Breo [facility]

## 2018-03-15 NOTE — Telephone Encounter (Signed)
Aware of medication change and recommendations 

## 2018-03-16 ENCOUNTER — Other Ambulatory Visit: Payer: Self-pay | Admitting: *Deleted

## 2018-03-16 DIAGNOSIS — R7989 Other specified abnormal findings of blood chemistry: Secondary | ICD-10-CM

## 2018-03-27 ENCOUNTER — Ambulatory Visit: Payer: Self-pay | Admitting: Family Medicine

## 2018-03-27 ENCOUNTER — Other Ambulatory Visit: Payer: Self-pay | Admitting: Family Medicine

## 2018-03-30 ENCOUNTER — Encounter: Payer: Self-pay | Admitting: Family Medicine

## 2018-03-31 ENCOUNTER — Encounter: Payer: Self-pay | Admitting: Family Medicine

## 2018-03-31 ENCOUNTER — Ambulatory Visit (INDEPENDENT_AMBULATORY_CARE_PROVIDER_SITE_OTHER): Payer: Medicare Other | Admitting: Family Medicine

## 2018-03-31 VITALS — BP 137/87 | HR 100 | Temp 98.5°F | Ht 62.0 in

## 2018-03-31 DIAGNOSIS — R1012 Left upper quadrant pain: Secondary | ICD-10-CM

## 2018-03-31 DIAGNOSIS — J411 Mucopurulent chronic bronchitis: Secondary | ICD-10-CM

## 2018-03-31 DIAGNOSIS — Z23 Encounter for immunization: Secondary | ICD-10-CM

## 2018-03-31 DIAGNOSIS — I509 Heart failure, unspecified: Secondary | ICD-10-CM

## 2018-03-31 DIAGNOSIS — G894 Chronic pain syndrome: Secondary | ICD-10-CM

## 2018-03-31 DIAGNOSIS — K219 Gastro-esophageal reflux disease without esophagitis: Secondary | ICD-10-CM

## 2018-03-31 MED ORDER — ARIPIPRAZOLE 20 MG PO TABS: 20.0000 mg | ORAL_TABLET | Freq: Every day | ORAL | 2 refills | Status: DC

## 2018-03-31 MED ORDER — TIOTROPIUM BROMIDE MONOHYDRATE 18 MCG IN CAPS: 18.0000 ug | ORAL_CAPSULE | Freq: Every day | RESPIRATORY_TRACT | 2 refills | Status: DC

## 2018-03-31 MED ORDER — UMECLIDINIUM-VILANTEROL 62.5-25 MCG/INH IN AEPB: 1.0000 | INHALATION_SPRAY | Freq: Every day | RESPIRATORY_TRACT | 5 refills | Status: DC

## 2018-03-31 MED ORDER — TRAMADOL HCL 50 MG PO TABS: 50.0000 mg | ORAL_TABLET | Freq: Four times a day (QID) | ORAL | 0 refills | Status: DC | PRN

## 2018-03-31 NOTE — Progress Notes (Signed)
Subjective:  Patient ID: Brittany Conrad, female    DOB: Mar 15, 1951  Age: 67 y.o. MRN: [id number]  CC: Medication Refill (pt here today to discuss refills on her Tramadol and her Ativan which she has appts with Pain Management and Psych)   HPI [name] presents for follow-up on her various medical conditions including pain.  She continues to take tramadol with very minimal relief but has an upcoming appointment with pain management next week.  Pain is essentially unchanged from last visit.  That visit was reviewed along with her symptoms.  Additionally she says she is snapping at people and yelling a lot.  She does not think her Abilify is helping.  Of note is that she is also continuing to take her Zyprexa.  Patient notes that she is short of breath for walking more than 10 to 20 feet in her home.  This seems to be somewhat worse in spite of treatment.  She has not had significant edema.  She would like to have a change in inhaler.  She wants to go from the [place] to [employer].  She thinks that would help her better.  Depression screen PHQ 2/9 03/09/2018  Decreased Interest 0  Down, Depressed, Hopeless 0  PHQ - 2 Score 0    History [name] has a past medical history of Asthma, COPD (chronic obstructive pulmonary disease) ([facility]), Emphysema lung ([facility]), and Scoliosis.   She has a past surgical history that includes Abdominal hysterectomy; Gallbladder surgery; Breast reduction surgery; tummy tuck; and Back surgery.   Her family history includes Cancer in her father and mother.She reports that she has quit smoking. She has never used smokeless tobacco. Her alcohol and drug histories are not on file.    ROS Review of Systems  Constitutional: Negative.   HENT: Negative for congestion.   Eyes: Negative for visual disturbance.  Respiratory: Positive for shortness of breath.   Cardiovascular: Negative for chest pain.  Gastrointestinal: Positive for abdominal pain (Left upper quadrant just lateral  to the stomach margin.  Described as a burning.  It is intermittent exacerbated by foods.). Negative for constipation, diarrhea, nausea and vomiting.  Genitourinary: Negative for difficulty urinating.  Musculoskeletal: Negative for arthralgias and myalgias.  Neurological: Negative for headaches.  Psychiatric/Behavioral: Negative for sleep disturbance.    Objective:  BP 137/87   Pulse 100   Temp 98.5 F (36.9 C) (Oral)   Ht 5\' 2"  (1.575 m)   BMI 30.00 kg/m   BP Readings from Last 3 Encounters:  03/31/18 137/87  03/09/18 133/86    Wt Readings from Last 3 Encounters:  03/09/18 164 lb (74.4 kg)     Physical Exam  Constitutional: She is oriented to person, place, and time. She appears well-developed and well-nourished. No distress.  HENT:  Head: Normocephalic and atraumatic.  Right Ear: External ear normal.  Left Ear: External ear normal.  Nose: Nose normal.  Mouth/Throat: Oropharynx is clear and moist.  Eyes: Pupils are equal, round, and reactive to light. Conjunctivae and EOM are normal.  Neck: Normal range of motion. Neck supple. No thyromegaly present.  Cardiovascular: Normal rate, regular rhythm and normal heart sounds.  No murmur heard. Pulmonary/Chest: Effort normal and breath sounds normal. No respiratory distress. She has no wheezes. She has no rales.  Abdominal: Soft. Bowel sounds are normal. She exhibits no distension. There is tenderness (Mild, left upper quadrant).  Lymphadenopathy:    She has no cervical adenopathy.  Neurological: She is alert and oriented to person,  place, and time. She has normal reflexes.  Skin: Skin is warm and dry.  Psychiatric: She has a normal mood and affect. Her behavior is normal. Judgment and thought content normal.      Assessment & Plan:   Jalia was seen today for medication refill.  Diagnoses and all orders for this visit:  Mucopurulent chronic bronchitis ([facility]) -     DG Chest 2 View; Future  LUQ pain -     US Abdomen  Complete; Future  Encounter for immunization -     Flu vaccine HIGH DOSE PF  Chronic congestive heart failure, unspecified heart failure type ([facility])  Chronic pain syndrome  Gastroesophageal reflux disease, esophagitis presence not specified  Other orders -     ARIPiprazole (ABILIFY) 20 MG tablet; Take 1 tablet (20 mg total) by mouth daily. -     umeclidinium-vilanterol (ANORO ELLIPTA) 62.5-25 MCG/INH AEPB; Inhale 1 puff into the lungs daily. -     tiotropium (SPIRIVA) 18 MCG inhalation capsule; Place 1 capsule (18 mcg total) into inhaler and inhale daily. -     traMADol (ULTRAM) 50 MG tablet; Take 1-2 tablets (50-100 mg total) by mouth every 6 (six) hours as needed. for pain       I have discontinued Okey Regal Lacerda's DULoxetine, fluticasone furoate-vilanterol, ARIPiprazole, and OLANZapine zydis. I have also changed her tiotropium and traMADol. Additionally, I am having her start on ARIPiprazole and umeclidinium-vilanterol. Lastly, I am having her maintain her oxycodone, LORazepam, albuterol, amitriptyline, Vitamin D3, docusate sodium, folic acid, furosemide, guaiFENesin, pantoprazole, potassium chloride, and albuterol.  Allergies as of 03/31/2018   No Known Allergies     Medication List        Accurate as of 03/31/18  5:21 PM. Always use your most recent med list.          albuterol 0.63 MG/3ML nebulizer solution Commonly known as:  ACCUNEB Inhale into the lungs every 4 (four) hours as needed.   albuterol (2.5 MG/3ML) 0.083% nebulizer solution Commonly known as:  PROVENTIL Take 3 mLs (2.5 mg total) by nebulization every 6 (six) hours as needed for wheezing or shortness of breath.   amitriptyline 150 MG tablet Commonly known as:  ELAVIL Take 1 tablet (150 mg total) by mouth at bedtime.   ARIPiprazole 20 MG tablet Commonly known as:  ABILIFY Take 1 tablet (20 mg total) by mouth daily.   docusate sodium 100 MG capsule Commonly known as:  COLACE Take 1 capsule (100 mg  total) by mouth daily as needed for mild constipation.   folic acid 1 MG tablet Commonly known as:  FOLVITE Take 1 tablet (1 mg total) by mouth daily.   furosemide 40 MG tablet Commonly known as:  LASIX Take 1 tablet (40 mg total) by mouth 2 (two) times daily.   guaiFENesin 600 MG 12 hr tablet Commonly known as:  MUCINEX Take 2 tablets (1,200 mg total) by mouth 2 (two) times daily.   LORazepam 0.5 MG tablet Commonly known as:  ATIVAN Take 0.5 mg by mouth 2 (two) times daily.   oxycodone 5 MG capsule Commonly known as:  OXY-IR Take 5 mg by mouth every 6 (six) hours as needed.   pantoprazole 40 MG tablet Commonly known as:  PROTONIX Take 1 tablet (40 mg total) by mouth daily.   potassium chloride 20 MEQ/15ML (10%) Soln Take 3 times daily   tiotropium 18 MCG inhalation capsule Commonly known as:  SPIRIVA Place 1 capsule (18 mcg total) into inhaler  and inhale daily.   traMADol 50 MG tablet Commonly known as:  ULTRAM Take 1-2 tablets (50-100 mg total) by mouth every 6 (six) hours as needed. for pain   umeclidinium-vilanterol 62.5-25 MCG/INH Aepb Commonly known as:  ANORO ELLIPTA Inhale 1 puff into the lungs daily.   Vitamin D3 1000 units Caps Take 1 capsule (1,000 Units total) by mouth daily.        Follow-up: Return in about 1 month (around 04/30/2018).  Mechele Claude, M.D.

## 2018-04-03 ENCOUNTER — Ambulatory Visit (INDEPENDENT_AMBULATORY_CARE_PROVIDER_SITE_OTHER): Payer: Medicare Other

## 2018-04-03 ENCOUNTER — Other Ambulatory Visit: Payer: Self-pay | Admitting: Family Medicine

## 2018-04-03 DIAGNOSIS — J411 Mucopurulent chronic bronchitis: Secondary | ICD-10-CM | POA: Diagnosis not present

## 2018-04-03 DIAGNOSIS — R1012 Left upper quadrant pain: Secondary | ICD-10-CM

## 2018-04-03 MED ORDER — ALBUTEROL SULFATE 0.63 MG/3ML IN NEBU: 1.0000 | INHALATION_SOLUTION | RESPIRATORY_TRACT | 1 refills | Status: DC | PRN

## 2018-04-03 MED ORDER — LEVOFLOXACIN 500 MG PO TABS: 500.0000 mg | ORAL_TABLET | Freq: Every day | ORAL | 0 refills | Status: DC

## 2018-04-03 MED ORDER — ALBUTEROL SULFATE (2.5 MG/3ML) 0.083% IN NEBU: 2.5000 mg | INHALATION_SOLUTION | Freq: Four times a day (QID) | RESPIRATORY_TRACT | 1 refills | Status: DC | PRN

## 2018-04-03 NOTE — Telephone Encounter (Signed)
Neb solution sent to pharmacy She will check with pharmacy on Protonix

## 2018-04-04 ENCOUNTER — Telehealth: Payer: Self-pay | Admitting: Family Medicine

## 2018-04-04 ENCOUNTER — Other Ambulatory Visit: Payer: Self-pay | Admitting: Family Medicine

## 2018-04-04 NOTE — Telephone Encounter (Signed)
Please contact the patient.  She received 2 strengths of albuterol nebulizer solution.  She should use the one that is 2.5 mg in 3 mL and discontinue the one that is 0.63 mg in 3 mL

## 2018-04-04 NOTE — Telephone Encounter (Signed)
Pharmacy aware

## 2018-04-04 NOTE — Telephone Encounter (Signed)
[facility]

## 2018-04-10 ENCOUNTER — Ambulatory Visit (HOSPITAL_COMMUNITY): Payer: Medicare Other

## 2018-04-10 ENCOUNTER — Other Ambulatory Visit: Payer: Self-pay | Admitting: Family Medicine

## 2018-04-13 ENCOUNTER — Other Ambulatory Visit: Payer: Self-pay | Admitting: Hematology

## 2018-04-13 DIAGNOSIS — J449 Chronic obstructive pulmonary disease, unspecified: Secondary | ICD-10-CM | POA: Insufficient documentation

## 2018-04-13 DIAGNOSIS — D649 Anemia, unspecified: Secondary | ICD-10-CM | POA: Insufficient documentation

## 2018-04-17 ENCOUNTER — Other Ambulatory Visit: Payer: Self-pay | Admitting: Hematology

## 2018-04-18 ENCOUNTER — Other Ambulatory Visit: Payer: Self-pay | Admitting: Family Medicine

## 2018-04-18 ENCOUNTER — Ambulatory Visit: Payer: Self-pay | Admitting: Hematology

## 2018-04-18 ENCOUNTER — Other Ambulatory Visit: Payer: Self-pay

## 2018-04-24 ENCOUNTER — Other Ambulatory Visit: Payer: Self-pay | Admitting: Family Medicine

## 2018-04-27 ENCOUNTER — Telehealth: Payer: Self-pay | Admitting: Family Medicine

## 2018-04-27 NOTE — Telephone Encounter (Signed)
Please call pt about the liquid potassium said she has been reading online about it and wants to talk to nurse about it first.

## 2018-04-27 NOTE — Telephone Encounter (Signed)
[facility] Pt is wanting to know if we can send in a rx for potassium chloride in pill form instead of liquid like she has been doing She is also needing a refill on her pantoprazole

## 2018-04-27 NOTE — Telephone Encounter (Signed)
Aware, provider out of office and will address on return.  Pantaprozole cost $100 because insurance ran out.. Advised to get help with costs by calling [facility] medicine assistance program.  Please change potassium  liquid to pill and send in new script.

## 2018-05-01 ENCOUNTER — Ambulatory Visit (HOSPITAL_COMMUNITY): Payer: Self-pay | Admitting: Psychiatry

## 2018-05-01 NOTE — Telephone Encounter (Signed)
Have her get OTC prevacid and take 30 mg a day to replace the protonix. Store brand should save her a lot of money. I will send in th epotassium pills. WS

## 2018-05-01 NOTE — Telephone Encounter (Signed)
Please send potassium rx, or I can if same dosage

## 2018-05-05 ENCOUNTER — Other Ambulatory Visit: Payer: Self-pay | Admitting: Family Medicine

## 2018-05-05 DIAGNOSIS — J449 Chronic obstructive pulmonary disease, unspecified: Secondary | ICD-10-CM | POA: Diagnosis not present

## 2018-05-10 ENCOUNTER — Ambulatory Visit (INDEPENDENT_AMBULATORY_CARE_PROVIDER_SITE_OTHER): Payer: Medicare Other | Admitting: Family Medicine

## 2018-05-10 ENCOUNTER — Encounter: Payer: Self-pay | Admitting: Family Medicine

## 2018-05-10 ENCOUNTER — Ambulatory Visit (INDEPENDENT_AMBULATORY_CARE_PROVIDER_SITE_OTHER): Payer: Medicare Other

## 2018-05-10 VITALS — BP 123/81 | HR 102 | Temp 98.6°F | Ht 62.0 in | Wt 164.2 lb

## 2018-05-10 DIAGNOSIS — G894 Chronic pain syndrome: Secondary | ICD-10-CM

## 2018-05-10 DIAGNOSIS — M545 Low back pain: Secondary | ICD-10-CM | POA: Diagnosis not present

## 2018-05-10 MED ORDER — FLUTICASONE-UMECLIDIN-VILANT 100-62.5-25 MCG/INH IN AEPB: 1.0000 | INHALATION_SPRAY | Freq: Every day | RESPIRATORY_TRACT | 11 refills | Status: DC

## 2018-05-10 MED ORDER — AMITRIPTYLINE HCL 150 MG PO TABS: 150.0000 mg | ORAL_TABLET | Freq: Every day | ORAL | 1 refills | Status: DC

## 2018-05-10 MED ORDER — VITAMIN D3 25 MCG (1000 UT) PO CAPS: 1000.0000 [IU] | ORAL_CAPSULE | Freq: Every day | ORAL | 11 refills | Status: DC

## 2018-05-10 MED ORDER — PANTOPRAZOLE SODIUM 40 MG PO TBEC: 40.0000 mg | DELAYED_RELEASE_TABLET | Freq: Two times a day (BID) | ORAL | 3 refills | Status: DC

## 2018-05-10 MED ORDER — POTASSIUM CHLORIDE CRYS ER 20 MEQ PO TBCR: 40.0000 meq | EXTENDED_RELEASE_TABLET | Freq: Three times a day (TID) | ORAL | 5 refills | Status: DC

## 2018-05-10 MED ORDER — ARIPIPRAZOLE 30 MG PO TABS: 30.0000 mg | ORAL_TABLET | Freq: Every day | ORAL | 2 refills | Status: DC

## 2018-05-10 MED ORDER — FUROSEMIDE 40 MG PO TABS: 40.0000 mg | ORAL_TABLET | Freq: Two times a day (BID) | ORAL | 5 refills | Status: DC

## 2018-05-10 MED ORDER — ALBUTEROL SULFATE (2.5 MG/3ML) 0.083% IN NEBU: 2.5000 mg | INHALATION_SOLUTION | Freq: Four times a day (QID) | RESPIRATORY_TRACT | 1 refills | Status: DC | PRN

## 2018-05-10 MED ORDER — FOLIC ACID 1 MG PO TABS: 1.0000 mg | ORAL_TABLET | Freq: Every day | ORAL | 11 refills | Status: DC

## 2018-05-10 MED ORDER — GUAIFENESIN ER 600 MG PO TB12: 1200.0000 mg | ORAL_TABLET | Freq: Two times a day (BID) | ORAL | 11 refills | Status: DC

## 2018-05-10 NOTE — Progress Notes (Signed)
Subjective:  Patient ID: Brittany Conrad, female    DOB: 1951-05-04  Age: 67 y.o. MRN: [id number]  CC: Medication Dose Change and Follow-up (wants back xray for pain clinic )   HPI [name] presents for feeling manic, anxious. Would like an increase in the abilify.  Recently seen at pain clinic and started on vicoprofen for chronic lumbar pain. Needs lumbar X-ray performed for the clinic.   Depression screen [facility] 2/9 05/10/2018 03/09/2018  Decreased Interest 0 0  Down, Depressed, Hopeless 0 0  PHQ - 2 Score 0 0    History [name] has a past medical history of Asthma, COPD (chronic obstructive pulmonary disease) ([facility]), Emphysema lung ([facility]), and Scoliosis.   She has a past surgical history that includes Abdominal hysterectomy; Gallbladder surgery; Breast reduction surgery; tummy tuck; and Back surgery.   Her family history includes Cancer in her father and mother.She reports that she has quit smoking. She has never used smokeless tobacco. Her alcohol and drug histories are not on file.    ROS Review of Systems  Constitutional: Negative.   HENT: Negative for congestion.   Eyes: Negative for visual disturbance.  Respiratory: Negative for shortness of breath.   Cardiovascular: Negative for chest pain.  Gastrointestinal: Negative for abdominal pain, constipation, diarrhea, nausea and vomiting.  Genitourinary: Negative for difficulty urinating.  Musculoskeletal: Positive for arthralgias and back pain. Negative for myalgias.  Neurological: Negative for headaches.  Psychiatric/Behavioral: Positive for agitation. Negative for sleep disturbance. The patient is nervous/anxious.     Objective:  BP 123/81   Pulse (!) 102   Temp 98.6 F (37 C) (Oral)   Ht 5\' 2"  (1.575 m)   Wt 164 lb 3.2 oz (74.5 kg)   BMI 30.03 kg/m   BP Readings from Last 3 Encounters:  05/10/18 123/81  03/31/18 137/87  03/09/18 133/86    Wt Readings from Last 3 Encounters:  05/10/18 164 lb 3.2 oz (74.5 kg)    03/09/18 164 lb (74.4 kg)     Physical Exam    Assessment & Plan:   Barbee was seen today for medication dose change and follow-up.  Diagnoses and all orders for this visit:  Chronic pain syndrome -     DG Lumbar Spine 2-3 Views; Future  Other orders -     Discontinue: ARIPiprazole (ABILIFY) 30 MG tablet; Take 1 tablet (30 mg total) by mouth daily. -     Discontinue: pantoprazole (PROTONIX) 40 MG tablet; Take 1 tablet (40 mg total) by mouth 2 (two) times daily. -     Discontinue: potassium chloride SA (K-DUR,KLOR-CON) 20 MEQ tablet; Take 2 tablets (40 mEq total) by mouth 3 (three) times daily. For potassium replacement/ supplement -     Discontinue: Fluticasone-Umeclidin-Vilant (TRELEGY ELLIPTA) 100-62.5-25 MCG/INH AEPB; Inhale 1 puff into the lungs daily. -     potassium chloride SA (K-DUR,KLOR-CON) 20 MEQ tablet; Take 2 tablets (40 mEq total) by mouth 3 (three) times daily. For potassium replacement/ supplement -     pantoprazole (PROTONIX) 40 MG tablet; Take 1 tablet (40 mg total) by mouth 2 (two) times daily. -     guaiFENesin (MUCINEX) 600 MG 12 hr tablet; Take 2 tablets (1,200 mg total) by mouth 2 (two) times daily. -     furosemide (LASIX) 40 MG tablet; Take 1 tablet (40 mg total) by mouth 2 (two) times daily. -     folic acid (FOLVITE) 1 MG tablet; Take 1 tablet (1 mg total) by mouth daily. -  Fluticasone-Umeclidin-Vilant (TRELEGY ELLIPTA) 100-62.5-25 MCG/INH AEPB; Inhale 1 puff into the lungs daily. -     Cholecalciferol (VITAMIN D3) 25 MCG (1000 UT) CAPS; Take 1 capsule (1,000 Units total) by mouth daily. -     ARIPiprazole (ABILIFY) 30 MG tablet; Take 1 tablet (30 mg total) by mouth daily. -     amitriptyline (ELAVIL) 150 MG tablet; Take 1 tablet (150 mg total) by mouth at bedtime. -     albuterol (PROVENTIL) (2.5 MG/3ML) 0.083% nebulizer solution; Take 3 mLs (2.5 mg total) by nebulization every 6 (six) hours as needed for wheezing or shortness of breath. -      hydrocodone-ibuprofen (VICOPROFEN) 5-200 MG tablet; Take 1 tablet by mouth 3 (three) times daily.       I have discontinued Ines Bloomer oxycodone, LORazepam, potassium chloride, umeclidinium-vilanterol, levofloxacin, and SPIRIVA HANDIHALER. I have also changed her Vitamin D3 and hydrocodone-ibuprofen. Additionally, I am having her maintain her docusate sodium, traMADol, potassium chloride SA, pantoprazole, guaiFENesin, furosemide, folic acid, Fluticasone-Umeclidin-Vilant, ARIPiprazole, amitriptyline, and albuterol.  Allergies as of 05/10/2018   No Known Allergies     Medication List        Accurate as of 05/10/18 11:59 PM. Always use your most recent med list.          albuterol (2.5 MG/3ML) 0.083% nebulizer solution Commonly known as:  PROVENTIL Take 3 mLs (2.5 mg total) by nebulization every 6 (six) hours as needed for wheezing or shortness of breath.   amitriptyline 150 MG tablet Commonly known as:  ELAVIL Take 1 tablet (150 mg total) by mouth at bedtime.   ARIPiprazole 30 MG tablet Commonly known as:  ABILIFY Take 1 tablet (30 mg total) by mouth daily.   docusate sodium 100 MG capsule Commonly known as:  COLACE Take 1 capsule (100 mg total) by mouth daily as needed for mild constipation.   Fluticasone-Umeclidin-Vilant 100-62.5-25 MCG/INH Aepb Inhale 1 puff into the lungs daily.   folic acid 1 MG tablet Commonly known as:  FOLVITE Take 1 tablet (1 mg total) by mouth daily.   furosemide 40 MG tablet Commonly known as:  LASIX Take 1 tablet (40 mg total) by mouth 2 (two) times daily.   guaiFENesin 600 MG 12 hr tablet Commonly known as:  MUCINEX Take 2 tablets (1,200 mg total) by mouth 2 (two) times daily.   hydrocodone-ibuprofen 5-200 MG tablet Commonly known as:  VICOPROFEN Take 1 tablet by mouth 3 (three) times daily.   pantoprazole 40 MG tablet Commonly known as:  PROTONIX Take 1 tablet (40 mg total) by mouth 2 (two) times daily.   potassium chloride  SA 20 MEQ tablet Commonly known as:  K-DUR,KLOR-CON Take 2 tablets (40 mEq total) by mouth 3 (three) times daily. For potassium replacement/ supplement   traMADol 50 MG tablet Commonly known as:  ULTRAM Take 1-2 tablets (50-100 mg total) by mouth every 6 (six) hours as needed. for pain   Vitamin D3 25 MCG (1000 UT) Caps Take 1 capsule (1,000 Units total) by mouth daily.        Follow-up: Return in about 1 month (around 06/09/2018).  Mechele Claude, M.D.

## 2018-05-12 DIAGNOSIS — J449 Chronic obstructive pulmonary disease, unspecified: Secondary | ICD-10-CM | POA: Diagnosis not present

## 2018-05-14 ENCOUNTER — Encounter: Payer: Self-pay | Admitting: Family Medicine

## 2018-05-14 MED ORDER — HYDROCODONE-IBUPROFEN 5-200 MG PO TABS: 1.0000 | ORAL_TABLET | Freq: Three times a day (TID) | ORAL | Status: DC

## 2018-05-17 ENCOUNTER — Ambulatory Visit (HOSPITAL_COMMUNITY): Payer: Medicare Other

## 2018-05-19 ENCOUNTER — Other Ambulatory Visit: Payer: Self-pay | Admitting: Family Medicine

## 2018-05-24 NOTE — Progress Notes (Deleted)
Psychiatric Initial Adult Assessment   Patient Identification: Brittany Conrad MRN:  [id number] Date of Evaluation:  05/24/2018 Referral Source: Dr. Deniece Ree Chief Complaint:   Visit Diagnosis: No diagnosis found.  History of Present Illness:   Brittany Conrad is a 67 y.o. year old female with a history of depression, COPD, chronic pain syndrome, who is referred for chronic pain.      Associated Signs/Symptoms: Depression Symptoms:  {DEPRESSION SYMPTOMS:20000} (Hypo) Manic Symptoms:  {BHH MANIC SYMPTOMS:22872} Anxiety Symptoms:  {BHH ANXIETY SYMPTOMS:22873} Psychotic Symptoms:  {BHH PSYCHOTIC SYMPTOMS:22874} PTSD Symptoms: {BHH PTSD SYMPTOMS:22875}  Past Psychiatric History:  Outpatient:  Psychiatry admission:  Previous suicide attempt:  Past trials of medication:  History of violence:   Previous Psychotropic Medications: {YES/NO:21197}  Substance Abuse History in the last 12 months:  {yes no:314532}  Consequences of Substance Abuse: {BHH CONSEQUENCES OF SUBSTANCE ABUSE:22880}  Past Medical History:  Past Medical History:  Diagnosis Date  . Asthma   . COPD (chronic obstructive pulmonary disease) (HCC)   . Emphysema lung (HCC)   . Scoliosis     Past Surgical History:  Procedure Laterality Date  . ABDOMINAL HYSTERECTOMY    . BACK SURGERY    . BREAST REDUCTION SURGERY    . GALLBLADDER SURGERY    . tummy tuck      Family Psychiatric History: ***  Family History:  Family History  Problem Relation Age of Onset  . Cancer Mother   . Cancer Father     Social History:   Social History   Socioeconomic History  . Marital status: Widowed    Spouse name: Not on file  . Number of children: Not on file  . Years of education: Not on file  . Highest education level: Not on file  Occupational History  . Not on file  Social Needs  . Financial resource strain: Not on file  . Food insecurity:    Worry: Not on file    Inability: Not on file  . Transportation  needs:    Medical: Not on file    Non-medical: Not on file  Tobacco Use  . Smoking status: Former Games developer  . Smokeless tobacco: Never Used  Substance and Sexual Activity  . Alcohol use: Not on file  . Drug use: Not on file  . Sexual activity: Not on file  Lifestyle  . Physical activity:    Days per week: Not on file    Minutes per session: Not on file  . Stress: Not on file  Relationships  . Social connections:    Talks on phone: Not on file    Gets together: Not on file    Attends religious service: Not on file    Active member of club or organization: Not on file    Attends meetings of clubs or organizations: Not on file    Relationship status: Not on file  Other Topics Concern  . Not on file  Social History Narrative  . Not on file    Additional Social History: ***  Allergies:  No Known Allergies  Metabolic Disorder Labs: No results found for: HGBA1C, MPG No results found for: PROLACTIN Lab Results  Component Value Date   CHOL 188 03/09/2018   TRIG 210 (H) 03/09/2018   HDL 57 03/09/2018   CHOLHDL 3.3 03/09/2018   LDLCALC 89 03/09/2018   No results found for: TSH  Therapeutic Level Labs: No results found for: LITHIUM No results found for: CBMZ No results found for: VALPROATE  Current  Medications: Current Outpatient Medications  Medication Sig Dispense Refill  . albuterol (PROVENTIL) (2.5 MG/3ML) 0.083% nebulizer solution Take 3 mLs (2.5 mg total) by nebulization every 6 (six) hours as needed for wheezing or shortness of breath. 150 mL 1  . amitriptyline (ELAVIL) 150 MG tablet Take 1 tablet (150 mg total) by mouth at bedtime. 90 tablet 1  . ARIPiprazole (ABILIFY) 30 MG tablet Take 1 tablet (30 mg total) by mouth daily. 30 tablet 2  . Cholecalciferol (VITAMIN D3) 25 MCG (1000 UT) CAPS Take 1 capsule (1,000 Units total) by mouth daily. 100 capsule 11  . docusate sodium (COLACE) 100 MG capsule Take 1 capsule (100 mg total) by mouth daily as needed for mild  constipation. 60 capsule 11  . Fluticasone-Umeclidin-Vilant (TRELEGY ELLIPTA) 100-62.5-25 MCG/INH AEPB Inhale 1 puff into the lungs daily. 28 each 11  . folic acid (FOLVITE) 1 MG tablet Take 1 tablet (1 mg total) by mouth daily. 30 tablet 11  . furosemide (LASIX) 40 MG tablet Take 1 tablet (40 mg total) by mouth 2 (two) times daily. 60 tablet 5  . guaiFENesin (MUCINEX) 600 MG 12 hr tablet Take 2 tablets (1,200 mg total) by mouth 2 (two) times daily. 60 tablet 11  . hydrocodone-ibuprofen (VICOPROFEN) 5-200 MG tablet Take 1 tablet by mouth 3 (three) times daily. 30 tablet   . OLANZapine (ZYPREXA) 5 MG tablet TAKE 1 TABLET BY MOUTH EVERY DAY 30 tablet 2  . pantoprazole (PROTONIX) 40 MG tablet Take 1 tablet (40 mg total) by mouth 2 (two) times daily. 180 tablet 3  . potassium chloride SA (K-DUR,KLOR-CON) 20 MEQ tablet Take 2 tablets (40 mEq total) by mouth 3 (three) times daily. For potassium replacement/ supplement 180 tablet 5  . traMADol (ULTRAM) 50 MG tablet Take 1-2 tablets (50-100 mg total) by mouth every 6 (six) hours as needed. for pain 40 tablet 0   No current facility-administered medications for this visit.     Musculoskeletal: Strength & Muscle Tone: within normal limits Gait & Station: normal Patient leans: N/A  Psychiatric Specialty Exam: ROS  There were no vitals taken for this visit.There is no height or weight on file to calculate BMI.  General Appearance: Fairly Groomed  Eye Contact:  Good  Speech:  Clear and Coherent  Volume:  Normal  Mood:  {BHH MOOD:22306}  Affect:  {Affect (PAA):22687}  Thought Process:  Coherent  Orientation:  Full (Time, Place, and Person)  Thought Content:  Logical  Suicidal Thoughts:  {ST/HT (PAA):22692}  Homicidal Thoughts:  {ST/HT (PAA):22692}  Memory:  Immediate;   Good  Judgement:  {Judgement (PAA):22694}  Insight:  {Insight (PAA):22695}  Psychomotor Activity:  Normal  Concentration:  Concentration: Good and Attention Span: Good   Recall:  Good  Fund of Knowledge:Good  Language: Good  Akathisia:  No  Handed:  Right  AIMS (if indicated):  N/A  Assets:  Communication Skills Desire for Improvement  ADL's:  Intact  Cognition: WNL  Sleep:  {BHH GOOD/FAIR/POOR:22877}   Screenings: PHQ2-9     Office Visit from 05/10/2018 in [facility] Office Visit from 03/09/2018 in [facility]  PHQ-2 Total Score  0  0      Assessment and Plan:  Assessment  Plan  The patient demonstrates the following risk factors for suicide: Chronic risk factors for suicide include: {Chronic Risk Factors for ZOXWRUE:45409811}. Acute risk factors for suicide include: {Acute Risk Factors for BJYNWGN:56213086}. Protective factors for this patient include: {Protective Factors for  Suicide ZOXW:96045409}Risk:30414013}. Considering these factors, the overall suicide risk at this point appears to be {Desc; low/moderate/high:110033}. Patient {ACTION; IS/IS WJX:[id number]}OT:21021397} appropriate for outpatient follow up.      Neysa Hottereina Chantelle Verdi, MD 11/20/20191:03 PM

## 2018-05-26 ENCOUNTER — Ambulatory Visit (HOSPITAL_COMMUNITY): Payer: Self-pay | Admitting: Psychiatry

## 2018-05-30 DIAGNOSIS — G894 Chronic pain syndrome: Secondary | ICD-10-CM | POA: Diagnosis not present

## 2018-05-30 DIAGNOSIS — M961 Postlaminectomy syndrome, not elsewhere classified: Secondary | ICD-10-CM | POA: Diagnosis not present

## 2018-05-30 DIAGNOSIS — Z79899 Other long term (current) drug therapy: Secondary | ICD-10-CM | POA: Diagnosis not present

## 2018-06-04 DIAGNOSIS — J449 Chronic obstructive pulmonary disease, unspecified: Secondary | ICD-10-CM | POA: Diagnosis not present

## 2018-06-07 DIAGNOSIS — J449 Chronic obstructive pulmonary disease, unspecified: Secondary | ICD-10-CM | POA: Diagnosis not present

## 2018-06-12 ENCOUNTER — Ambulatory Visit: Payer: Self-pay | Admitting: Family Medicine

## 2018-06-21 DIAGNOSIS — H47293 Other optic atrophy, bilateral: Secondary | ICD-10-CM | POA: Diagnosis not present

## 2018-06-21 DIAGNOSIS — H524 Presbyopia: Secondary | ICD-10-CM | POA: Diagnosis not present

## 2018-06-21 DIAGNOSIS — H5202 Hypermetropia, left eye: Secondary | ICD-10-CM | POA: Diagnosis not present
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
## 2018-07-03 ENCOUNTER — Encounter (HOSPITAL_COMMUNITY): Payer: Self-pay | Admitting: Psychiatry

## 2018-07-03 ENCOUNTER — Ambulatory Visit (INDEPENDENT_AMBULATORY_CARE_PROVIDER_SITE_OTHER): Payer: Medicare Other | Admitting: Psychiatry

## 2018-07-03 VITALS — BP 144/92 | HR 101 | Ht 62.0 in

## 2018-07-03 DIAGNOSIS — F3131 Bipolar disorder, current episode depressed, mild: Secondary | ICD-10-CM

## 2018-07-03 MED ORDER — AMITRIPTYLINE HCL 50 MG PO TABS: ORAL_TABLET | ORAL | 0 refills | Status: DC

## 2018-07-03 NOTE — Patient Instructions (Addendum)
1. Increase amitriptyline 200 mg at night  2. Continue Abilify 30 mg daily  3. Discontinue olanzapine 4. Check TSH 5. Contact [facility] for therapy

## 2018-07-05 DIAGNOSIS — J449 Chronic obstructive pulmonary disease, unspecified: Secondary | ICD-10-CM | POA: Diagnosis not present

## 2018-07-10 ENCOUNTER — Telehealth (HOSPITAL_COMMUNITY): Payer: Self-pay

## 2018-07-10 DIAGNOSIS — J449 Chronic obstructive pulmonary disease, unspecified: Secondary | ICD-10-CM | POA: Diagnosis not present

## 2018-07-10 DIAGNOSIS — R269 Unspecified abnormalities of gait and mobility: Secondary | ICD-10-CM | POA: Diagnosis not present

## 2018-07-10 NOTE — Telephone Encounter (Signed)
Dr. Vanetta ShawlHisada just saw her and did not elect to prescribe this. I will not initiate a controlled drug without her consent. She can consult dr. Vanetta ShawlHisada about this on her return

## 2018-07-10 NOTE — Telephone Encounter (Signed)
This is a Dr. Vanetta Shawl patient. This patient called requesting a prescription for Clonazepam 1mg . She was last seen on 07/03/18. Patient has been on Clonazepam in the past.  Pharmacy is [facility] in [place].

## 2018-07-11 NOTE — Telephone Encounter (Signed)
Called patient but was unable to leave voicemail.  Mailbox was full

## 2018-07-22 NOTE — Telephone Encounter (Signed)
I will not plan to start clonazepam at least until the next appointment. Please inform the patient that we will discuss at her next visit.

## 2018-07-24 NOTE — Telephone Encounter (Signed)
Mailbox is Full & unable to accept any messages

## 2018-07-27 ENCOUNTER — Other Ambulatory Visit: Payer: Self-pay | Admitting: Family Medicine

## 2018-07-27 NOTE — Telephone Encounter (Signed)
Last seen 07/20/17

## 2018-07-28 DIAGNOSIS — M961 Postlaminectomy syndrome, not elsewhere classified: Secondary | ICD-10-CM | POA: Diagnosis not present

## 2018-07-28 DIAGNOSIS — Z79899 Other long term (current) drug therapy: Secondary | ICD-10-CM | POA: Diagnosis not present

## 2018-07-28 DIAGNOSIS — G894 Chronic pain syndrome: Secondary | ICD-10-CM | POA: Diagnosis not present

## 2018-08-03 DIAGNOSIS — J449 Chronic obstructive pulmonary disease, unspecified: Secondary | ICD-10-CM | POA: Diagnosis not present
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
## 2018-08-08 ENCOUNTER — Encounter (HOSPITAL_COMMUNITY): Payer: Self-pay | Admitting: Psychiatry

## 2018-08-08 ENCOUNTER — Ambulatory Visit (INDEPENDENT_AMBULATORY_CARE_PROVIDER_SITE_OTHER): Payer: Medicare HMO | Admitting: Psychiatry

## 2018-08-08 VITALS — BP 121/89 | HR 93 | Ht 62.0 in

## 2018-08-08 DIAGNOSIS — F0630 Mood disorder due to known physiological condition, unspecified: Secondary | ICD-10-CM | POA: Insufficient documentation

## 2018-08-08 DIAGNOSIS — F39 Unspecified mood [affective] disorder: Secondary | ICD-10-CM

## 2018-08-08 MED ORDER — AMITRIPTYLINE HCL 50 MG PO TABS: ORAL_TABLET | ORAL | 0 refills | Status: DC

## 2018-08-08 NOTE — Patient Instructions (Signed)
1. Continue amitriptyline 200 mg at night  2. Continue Abilify 30 mg daily  3. Return to clinic as needed

## 2018-09-03 DIAGNOSIS — J449 Chronic obstructive pulmonary disease, unspecified: Secondary | ICD-10-CM | POA: Diagnosis not present

## 2018-09-06 DIAGNOSIS — J449 Chronic obstructive pulmonary disease, unspecified: Secondary | ICD-10-CM | POA: Diagnosis not present

## 2018-09-21 ENCOUNTER — Other Ambulatory Visit: Payer: Self-pay | Admitting: Family Medicine

## 2018-09-26 DIAGNOSIS — G894 Chronic pain syndrome: Secondary | ICD-10-CM | POA: Diagnosis not present

## 2018-09-26 DIAGNOSIS — L209 Atopic dermatitis, unspecified: Secondary | ICD-10-CM | POA: Diagnosis not present

## 2018-09-26 DIAGNOSIS — M961 Postlaminectomy syndrome, not elsewhere classified: Secondary | ICD-10-CM | POA: Diagnosis not present

## 2018-09-26 DIAGNOSIS — Z79899 Other long term (current) drug therapy: Secondary | ICD-10-CM | POA: Diagnosis not present

## 2018-10-03 DIAGNOSIS — J449 Chronic obstructive pulmonary disease, unspecified: Secondary | ICD-10-CM | POA: Diagnosis not present

## 2018-10-04 DIAGNOSIS — J449 Chronic obstructive pulmonary disease, unspecified: Secondary | ICD-10-CM | POA: Diagnosis not present

## 2018-10-17 ENCOUNTER — Ambulatory Visit (INDEPENDENT_AMBULATORY_CARE_PROVIDER_SITE_OTHER): Payer: Medicare HMO | Admitting: Family Medicine

## 2018-10-17 ENCOUNTER — Other Ambulatory Visit: Payer: Self-pay | Admitting: Family Medicine

## 2018-10-17 ENCOUNTER — Other Ambulatory Visit: Payer: Self-pay

## 2018-10-17 ENCOUNTER — Encounter: Payer: Self-pay | Admitting: Family Medicine

## 2018-10-17 DIAGNOSIS — J449 Chronic obstructive pulmonary disease, unspecified: Secondary | ICD-10-CM

## 2018-10-17 DIAGNOSIS — G894 Chronic pain syndrome: Secondary | ICD-10-CM

## 2018-10-17 DIAGNOSIS — F411 Generalized anxiety disorder: Secondary | ICD-10-CM | POA: Diagnosis not present

## 2018-10-17 DIAGNOSIS — F1320 Sedative, hypnotic or anxiolytic dependence, uncomplicated: Secondary | ICD-10-CM

## 2018-10-17 MED ORDER — LORAZEPAM 0.5 MG PO TABS: 0.5000 mg | ORAL_TABLET | Freq: Two times a day (BID) | ORAL | 2 refills | Status: DC | PRN

## 2018-10-17 MED ORDER — AMITRIPTYLINE HCL 100 MG PO TABS: 200.0000 mg | ORAL_TABLET | Freq: Every day | ORAL | 5 refills | Status: DC

## 2018-10-17 NOTE — Progress Notes (Addendum)
Subjective:  Patient ID: Brittany Conrad, female    DOB: 18-Nov-1950  Age: 68 y.o. MRN: [id number]  CC: No chief complaint on file.   HPI Brittany JuniperCarol Daggs presents for "I'm a nervous wreck. " I don't sleep at night. Nervous all the time. Afraid  Wasn't comfortable with Dr. Lonn GeorgiaHiseda. Wouldn't give her her ativan. Jumpy, nervous living alone. Feeling like she is in prison (sheltering due to COVID) Walks like off balance. Understands it is likely side effect of med profile. Will ambulate with cane. Seeing Pain clinic for opiates. Needs amitript. refill.  Pt. Aware that ativan may increase risk of CNS depression, cause drowsiness, shallow breathing and overdose when combined with opiates. Gets some dyspnea - makes her more anxious.  [facility] 05/10/2018 03/09/2018  Decreased Interest 0 0  Down, Depressed, Hopeless 0 0  PHQ - 2 Score 0 0    History Okey RegalCarol has a past medical history of Asthma, COPD (chronic obstructive pulmonary disease) (HCC), Emphysema lung (HCC), and Scoliosis.   She has a past surgical history that includes Abdominal hysterectomy; Gallbladder surgery; Breast reduction surgery; tummy tuck; and Back surgery.   Her family history includes Cancer in her father and mother.She reports that she has quit smoking. She has never used smokeless tobacco. No history on file for alcohol and drug.    ROS Review of Systems  Constitutional: Negative.  Negative for appetite change.  HENT: Negative.   Eyes: Negative for visual disturbance.  Respiratory: Negative for shortness of breath.   Cardiovascular: Negative for chest pain.  Gastrointestinal: Negative for abdominal pain.  Musculoskeletal: Positive for arthralgias and back pain.  Neurological: Positive for dizziness.  Psychiatric/Behavioral: Positive for sleep disturbance.    Objective:  There were no vitals taken for this visit.  BP Readings from Last 3 Encounters:  05/10/18 123/81  03/31/18 137/87  03/09/18 133/86     Wt Readings from Last 3 Encounters:  05/10/18 164 lb 3.2 oz (74.5 kg)  03/09/18 164 lb (74.4 kg)     Physical Exam Psychiatric:        Attention and Perception: Attention and perception normal.        Mood and Affect: Mood is anxious.        Speech: Speech is rapid and pressured.        Behavior: Behavior is agitated.        Thought Content: Thought content is paranoid.        Cognition and Memory: Cognition normal.        Judgment: Judgment is impulsive.       Assessment & Plan:   Diagnoses and all orders for this visit:  GAD (generalized anxiety disorder)  Chronic obstructive pulmonary disease, unspecified COPD type (HCC)  Chronic pain syndrome  Other orders -     amitriptyline (ELAVIL) 100 MG tablet; Take 2 tablets (200 mg total) by mouth at bedtime. -     LORazepam (ATIVAN) 0.5 MG tablet; Take 1 tablet (0.5 mg total) by mouth 2 (two) times daily as needed for anxiety.       I have changed Addasyn Scholz's amitriptyline. I am also having her start on LORazepam. Additionally, I am having her maintain her docusate sodium, traMADol, potassium chloride SA, pantoprazole, guaiFENesin, furosemide, folic acid, Fluticasone-Umeclidin-Vilant, Vitamin D3, albuterol, hydrocodone-ibuprofen, ibuprofen, ARIPiprazole, and ARIPiprazole.  Allergies as of 10/17/2018   No Known Allergies     Medication List       Accurate as of October 17, 2018  11:08 AM. Always use your most recent med list.        albuterol (2.5 MG/3ML) 0.083% nebulizer solution Commonly known as:  PROVENTIL Take 3 mLs (2.5 mg total) by nebulization every 6 (six) hours as needed for wheezing or shortness of breath.   amitriptyline 100 MG tablet Commonly known as:  ELAVIL Take 2 tablets (200 mg total) by mouth at bedtime.   ARIPiprazole 30 MG tablet Commonly known as:  ABILIFY Take 1 tablet (30 mg total) by mouth daily.   ARIPiprazole 30 MG tablet Commonly known as:  ABILIFY TAKE 1 TABLET BY MOUTH  EVERY DAY   docusate sodium 100 MG capsule Commonly known as:  COLACE Take 1 capsule (100 mg total) by mouth daily as needed for mild constipation.   Fluticasone-Umeclidin-Vilant 100-62.5-25 MCG/INH Aepb Commonly known as:  Trelegy Ellipta Inhale 1 puff into the lungs daily.   folic acid 1 MG tablet Commonly known as:  FOLVITE Take 1 tablet (1 mg total) by mouth daily.   furosemide 40 MG tablet Commonly known as:  LASIX Take 1 tablet (40 mg total) by mouth 2 (two) times daily.   guaiFENesin 600 MG 12 hr tablet Commonly known as:  MUCINEX Take 2 tablets (1,200 mg total) by mouth 2 (two) times daily.   hydrocodone-ibuprofen 5-200 MG tablet Commonly known as:  VICOPROFEN Take 1 tablet by mouth 3 (three) times daily.   ibuprofen 800 MG tablet Commonly known as:  ADVIL,MOTRIN Take 800 mg by mouth 3 (three) times daily as needed.   LORazepam 0.5 MG tablet Commonly known as:  ATIVAN Take 1 tablet (0.5 mg total) by mouth 2 (two) times daily as needed for anxiety.   pantoprazole 40 MG tablet Commonly known as:  PROTONIX Take 1 tablet (40 mg total) by mouth 2 (two) times daily.   potassium chloride SA 20 MEQ tablet Commonly known as:  K-DUR,KLOR-CON Take 2 tablets (40 mEq total) by mouth 3 (three) times daily. For potassium replacement/ supplement   traMADol 50 MG tablet Commonly known as:  ULTRAM Take 1-2 tablets (50-100 mg total) by mouth every 6 (six) hours as needed. for pain   Vitamin D3 25 MCG (1000 UT) Caps Take 1 capsule (1,000 Units total) by mouth daily.      Long term benzodiazepine user. Not able to function without it -  after trial off by psych.   Will execute controlled substance agreement at next visit. Virtual Visit via telephone Note  I discussed the limitations, risks, security and privacy concerns of performing an evaluation and management service by telephone and the availability of in person appointments. I also discussed with the patient that there  may be a patient responsible charge related to this service. The patient expressed understanding and agreed to proceed. Pt. Is at home. Dr. Darlyn Read is in his office.  Follow Up Instructions:   I discussed the assessment and treatment plan with the patient. The patient was provided an opportunity to ask questions and all were answered. The patient agreed with the plan and demonstrated an understanding of the instructions.   The patient was advised to call back or seek an in-person evaluation if the symptoms worsen or if the condition fails to improve as anticipated.   Total minutes including chart review and phone contact time: 20  Follow-up: Return in about 3 months (around 01/16/2019).  Mechele Claude, M.D.

## 2018-10-23 ENCOUNTER — Other Ambulatory Visit: Payer: Self-pay | Admitting: Family Medicine

## 2018-10-24 DIAGNOSIS — M961 Postlaminectomy syndrome, not elsewhere classified: Secondary | ICD-10-CM | POA: Diagnosis not present

## 2018-10-24 DIAGNOSIS — G894 Chronic pain syndrome: Secondary | ICD-10-CM | POA: Diagnosis not present

## 2018-10-24 DIAGNOSIS — Z79899 Other long term (current) drug therapy: Secondary | ICD-10-CM | POA: Diagnosis not present

## 2018-10-24 DIAGNOSIS — Z9189 Other specified personal risk factors, not elsewhere classified: Secondary | ICD-10-CM | POA: Diagnosis not present

## 2018-11-07 ENCOUNTER — Ambulatory Visit (INDEPENDENT_AMBULATORY_CARE_PROVIDER_SITE_OTHER): Payer: Medicare HMO | Admitting: Psychiatry

## 2018-11-07 ENCOUNTER — Other Ambulatory Visit: Payer: Self-pay

## 2018-11-07 ENCOUNTER — Encounter (HOSPITAL_COMMUNITY): Payer: Self-pay | Admitting: Psychiatry

## 2018-11-07 DIAGNOSIS — F0630 Mood disorder due to known physiological condition, unspecified: Secondary | ICD-10-CM | POA: Diagnosis not present

## 2018-11-07 MED ORDER — ARIPIPRAZOLE 20 MG PO TABS: 20.0000 mg | ORAL_TABLET | Freq: Every day | ORAL | 0 refills | Status: DC

## 2018-11-07 MED ORDER — LAMOTRIGINE 25 MG PO TABS: ORAL_TABLET | ORAL | 1 refills | Status: DC
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
## 2018-11-07 NOTE — Patient Instructions (Signed)
1. Decrease Abilify 20 mg daily  2. Start lamotrigine 25 mg daily for two weeks, then 50 mg daily  3. Continue amitriptyline 200 mg at night  4. Next appointment: 6/30 at 10:50

## 2018-11-09 ENCOUNTER — Other Ambulatory Visit: Payer: Self-pay | Admitting: Family Medicine

## 2018-11-28 DIAGNOSIS — G894 Chronic pain syndrome: Secondary | ICD-10-CM | POA: Diagnosis not present

## 2018-11-28 DIAGNOSIS — Z79899 Other long term (current) drug therapy: Secondary | ICD-10-CM | POA: Diagnosis not present

## 2018-11-28 DIAGNOSIS — M961 Postlaminectomy syndrome, not elsewhere classified: Secondary | ICD-10-CM | POA: Diagnosis not present

## 2018-12-18 ENCOUNTER — Other Ambulatory Visit: Payer: Self-pay

## 2018-12-18 DIAGNOSIS — S2249XA Multiple fractures of ribs, unspecified side, initial encounter for closed fracture: Secondary | ICD-10-CM | POA: Diagnosis not present

## 2018-12-18 DIAGNOSIS — M549 Dorsalgia, unspecified: Secondary | ICD-10-CM | POA: Diagnosis not present

## 2018-12-18 DIAGNOSIS — S2241XA Multiple fractures of ribs, right side, initial encounter for closed fracture: Secondary | ICD-10-CM | POA: Diagnosis not present

## 2018-12-22 ENCOUNTER — Other Ambulatory Visit: Payer: Self-pay | Admitting: Family Medicine

## 2018-12-25 DIAGNOSIS — Z79899 Other long term (current) drug therapy: Secondary | ICD-10-CM | POA: Diagnosis not present

## 2018-12-25 DIAGNOSIS — W109XXA Fall (on) (from) unspecified stairs and steps, initial encounter: Secondary | ICD-10-CM | POA: Diagnosis not present

## 2018-12-25 DIAGNOSIS — M961 Postlaminectomy syndrome, not elsewhere classified: Secondary | ICD-10-CM | POA: Diagnosis not present

## 2018-12-25 DIAGNOSIS — G894 Chronic pain syndrome: Secondary | ICD-10-CM | POA: Diagnosis not present

## 2018-12-28 DIAGNOSIS — S40812A Abrasion of left upper arm, initial encounter: Secondary | ICD-10-CM | POA: Diagnosis not present

## 2018-12-28 DIAGNOSIS — S40811A Abrasion of right upper arm, initial encounter: Secondary | ICD-10-CM | POA: Diagnosis not present

## 2018-12-28 DIAGNOSIS — W108XXS Fall (on) (from) other stairs and steps, sequela: Secondary | ICD-10-CM | POA: Diagnosis not present

## 2018-12-28 DIAGNOSIS — S51002A Unspecified open wound of left elbow, initial encounter: Secondary | ICD-10-CM | POA: Diagnosis not present

## 2019-01-02 ENCOUNTER — Encounter (HOSPITAL_COMMUNITY): Payer: Self-pay | Admitting: Psychiatry

## 2019-01-02 ENCOUNTER — Other Ambulatory Visit: Payer: Self-pay

## 2019-01-02 ENCOUNTER — Ambulatory Visit (INDEPENDENT_AMBULATORY_CARE_PROVIDER_SITE_OTHER): Payer: Medicare HMO | Admitting: Psychiatry

## 2019-01-02 DIAGNOSIS — F0630 Mood disorder due to known physiological condition, unspecified: Secondary | ICD-10-CM

## 2019-01-02 DIAGNOSIS — F39 Unspecified mood [affective] disorder: Secondary | ICD-10-CM | POA: Diagnosis not present

## 2019-01-02 DIAGNOSIS — F329 Major depressive disorder, single episode, unspecified: Secondary | ICD-10-CM

## 2019-01-02 DIAGNOSIS — F3189 Other bipolar disorder: Secondary | ICD-10-CM

## 2019-01-02 MED ORDER — LAMOTRIGINE 25 MG PO TABS: 50.0000 mg | ORAL_TABLET | Freq: Every day | ORAL | 1 refills | Status: DC

## 2019-01-02 MED ORDER — ARIPIPRAZOLE 10 MG PO TABS: 10.0000 mg | ORAL_TABLET | Freq: Every day | ORAL | 0 refills | Status: DC
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
## 2019-01-02 NOTE — Patient Instructions (Signed)
1. Decrease Abilify 10 mg daily  2. Continue lamotrigine 50 mg daily  3. Continue amitriptyline 200 mg at night  4. Next appointment: 8/26 at 11 AM

## 2019-01-09 ENCOUNTER — Other Ambulatory Visit: Payer: Self-pay | Admitting: Family Medicine

## 2019-01-11 DIAGNOSIS — Z23 Encounter for immunization: Secondary | ICD-10-CM | POA: Diagnosis not present

## 2019-01-18 ENCOUNTER — Other Ambulatory Visit: Payer: Self-pay | Admitting: Family Medicine

## 2019-01-22 ENCOUNTER — Other Ambulatory Visit: Payer: Self-pay | Admitting: Family Medicine

## 2019-01-26 DIAGNOSIS — H5710 Ocular pain, unspecified eye: Secondary | ICD-10-CM | POA: Diagnosis not present

## 2019-01-26 DIAGNOSIS — Z79899 Other long term (current) drug therapy: Secondary | ICD-10-CM | POA: Diagnosis not present

## 2019-01-26 DIAGNOSIS — M961 Postlaminectomy syndrome, not elsewhere classified: Secondary | ICD-10-CM | POA: Diagnosis not present

## 2019-01-26 DIAGNOSIS — J45909 Unspecified asthma, uncomplicated: Secondary | ICD-10-CM | POA: Diagnosis not present

## 2019-01-26 DIAGNOSIS — G894 Chronic pain syndrome: Secondary | ICD-10-CM | POA: Diagnosis not present

## 2019-01-26 DIAGNOSIS — S0510XA Contusion of eyeball and orbital tissues, unspecified eye, initial encounter: Secondary | ICD-10-CM | POA: Diagnosis not present

## 2019-01-29 DIAGNOSIS — W19XXXA Unspecified fall, initial encounter: Secondary | ICD-10-CM | POA: Diagnosis not present

## 2019-01-29 DIAGNOSIS — R4781 Slurred speech: Secondary | ICD-10-CM | POA: Diagnosis not present

## 2019-01-29 DIAGNOSIS — R404 Transient alteration of awareness: Secondary | ICD-10-CM | POA: Diagnosis not present

## 2019-01-29 DIAGNOSIS — Z209 Contact with and (suspected) exposure to unspecified communicable disease: Secondary | ICD-10-CM | POA: Diagnosis not present
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
## 2019-02-23 IMAGING — DX DG CHEST 2V
2 series · 2 of 2 positions shown · non-contrast
Comparison: PA and lateral chest x-ray March 09, 2018

CLINICAL DATA: Left-sided chest discomfort with indigestion.
History of asthma-COPD. Former smoker.

EXAM:
CHEST - 2 VIEW

[chest pa]
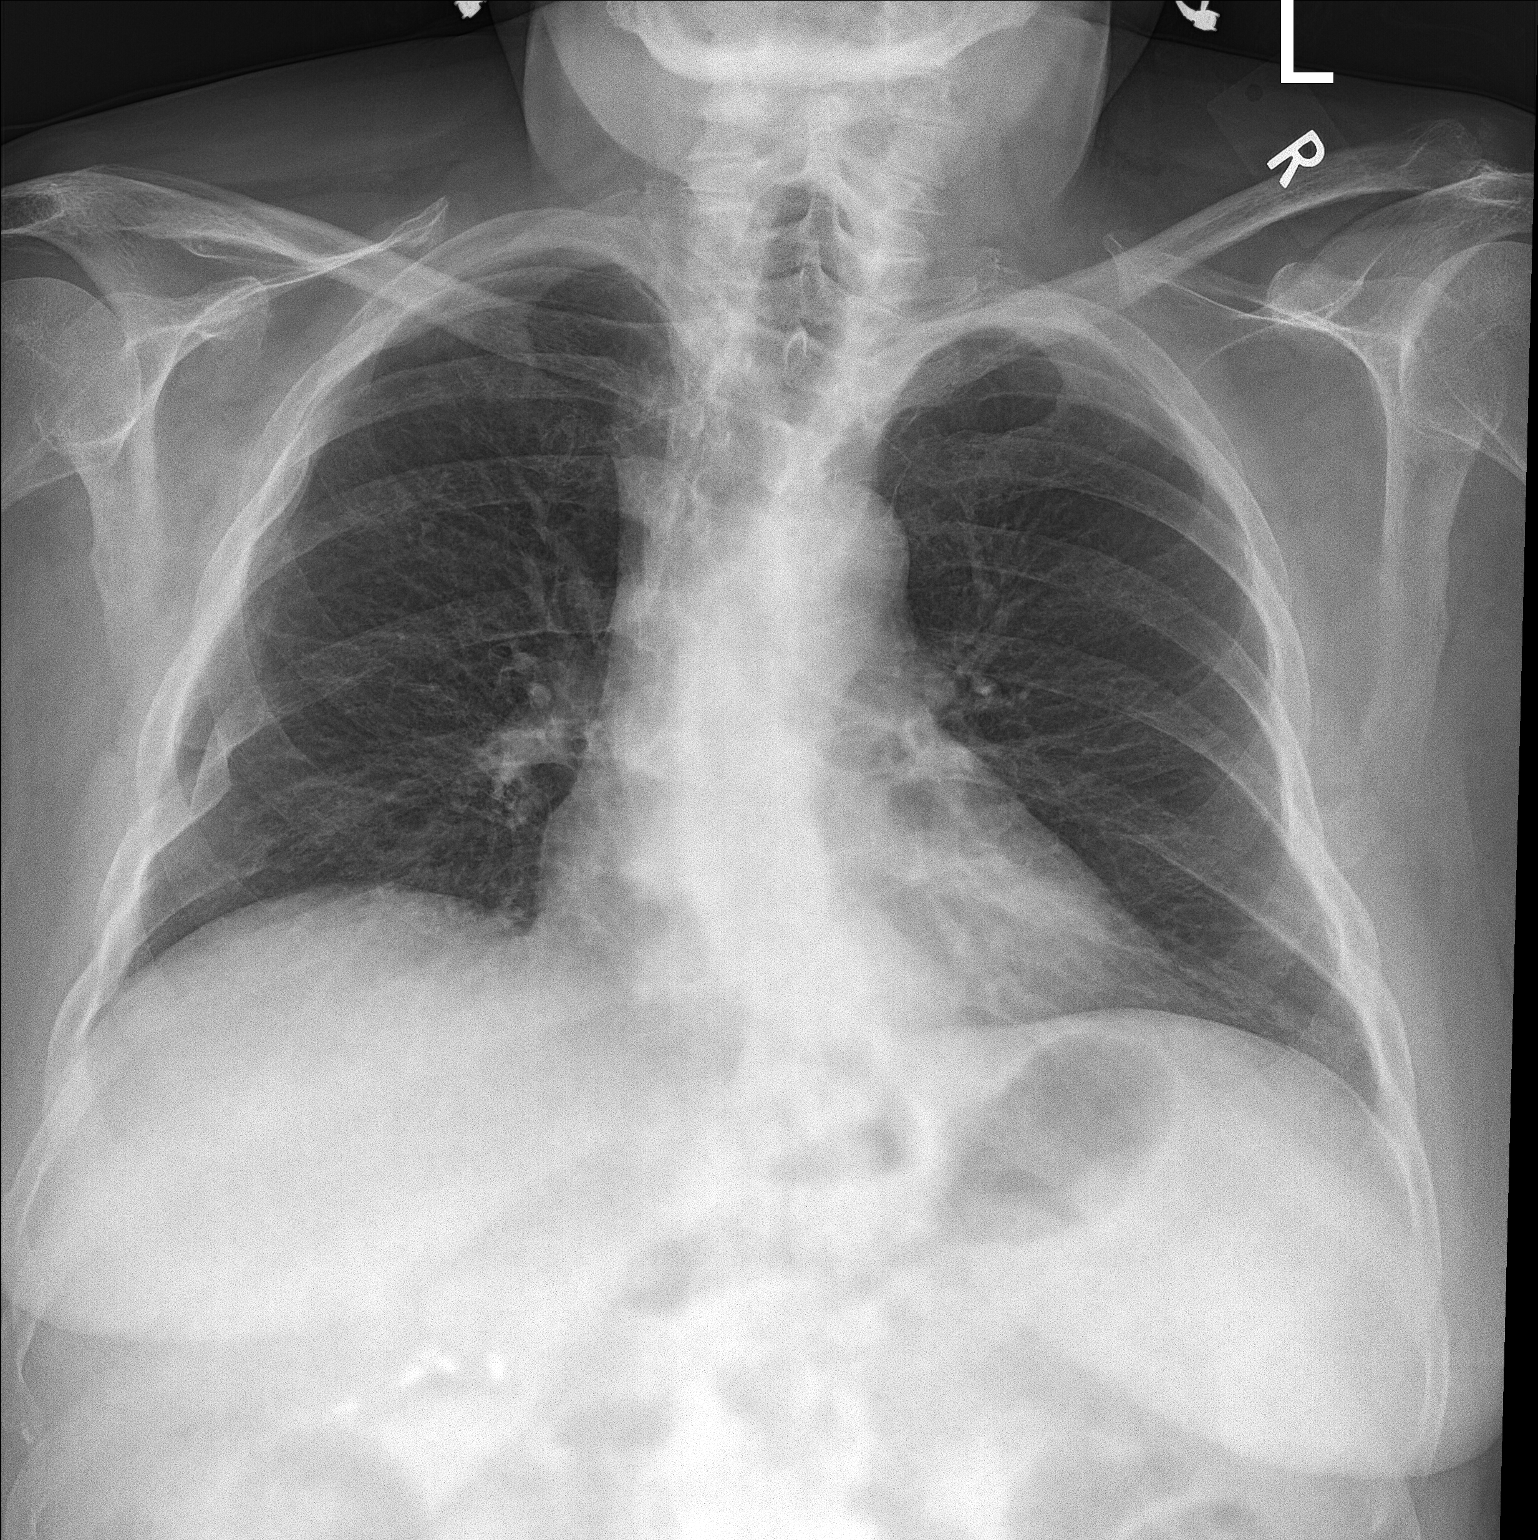

[chest lat]
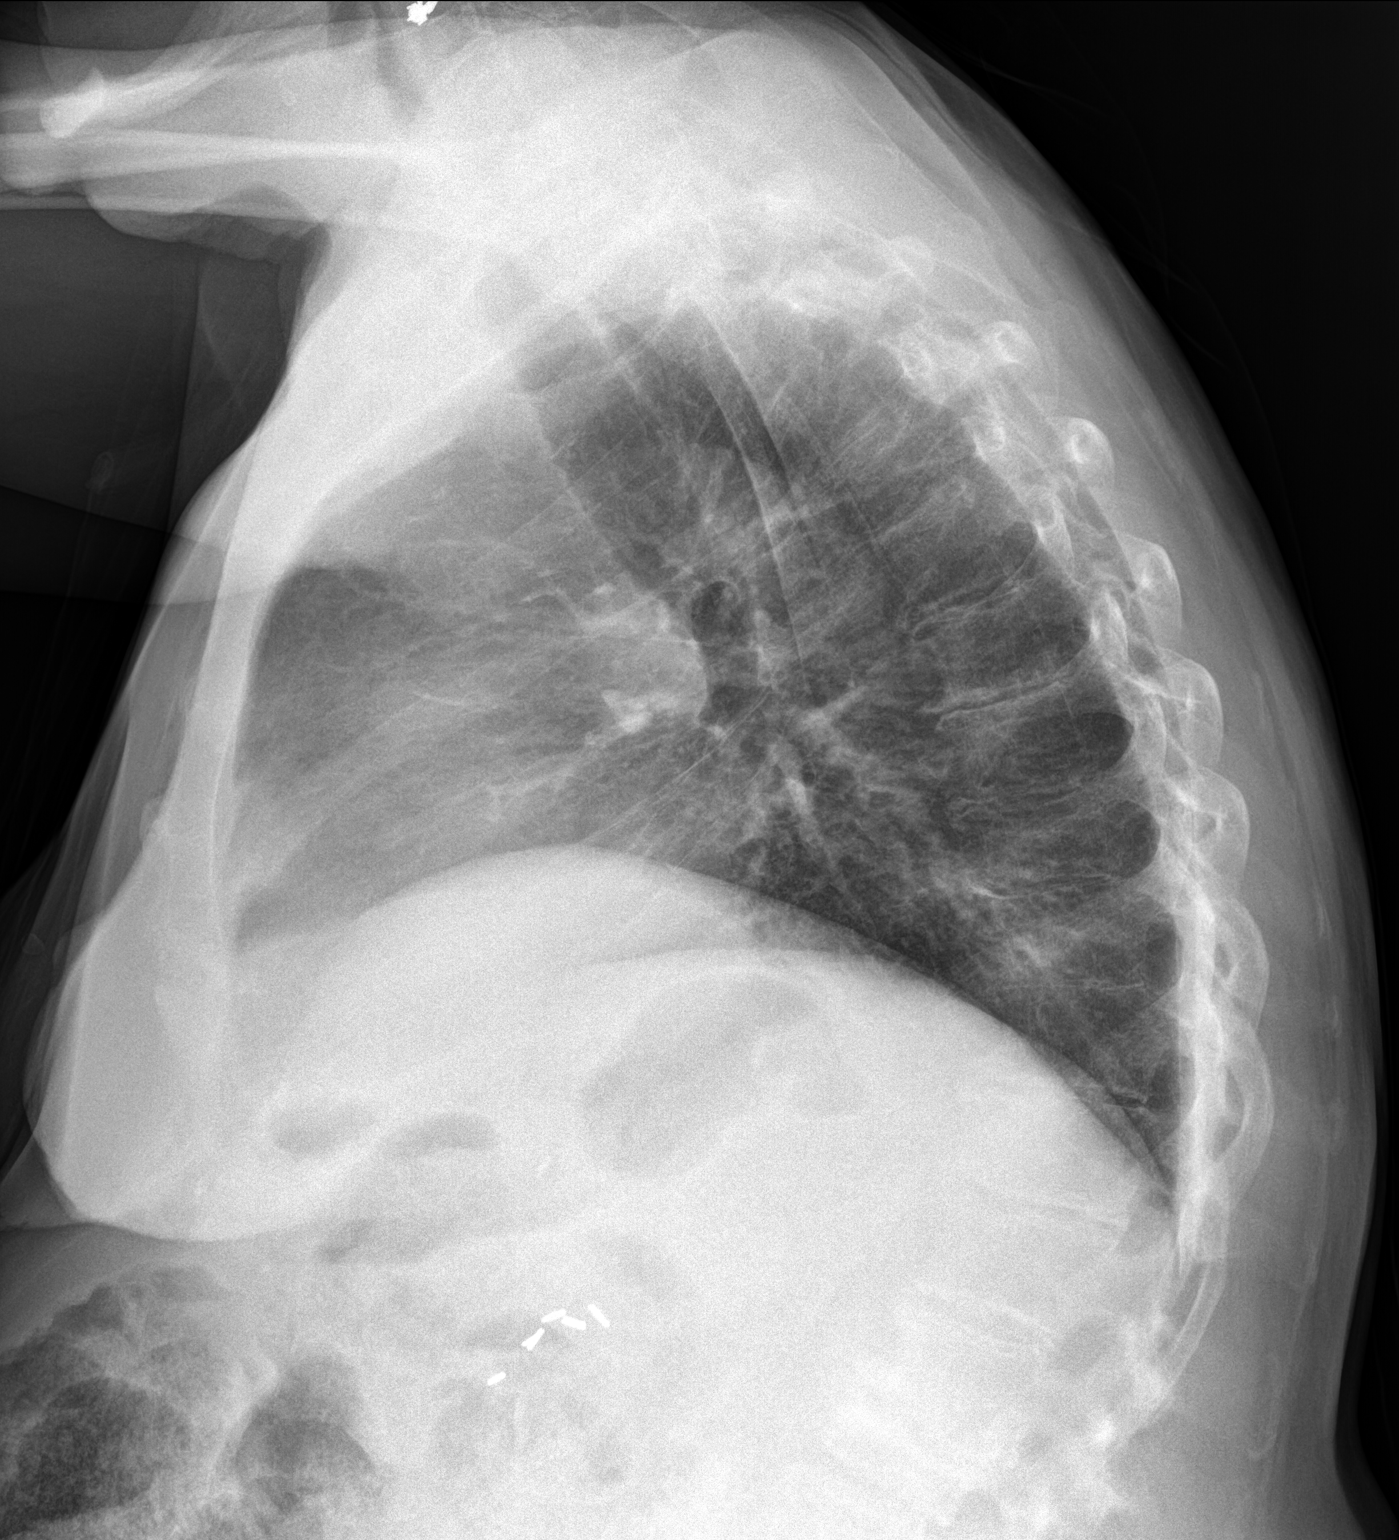

[2 of 2 positions shown; findings below may reference images not displayed]

FINDINGS: The lungs are adequately inflated. There is subtle increased density
in the lower retrosternal region likely on the left. There is no
pleural effusion. The heart and pulmonary vascularity are normal.
There is S shaped thoracolumbar curvature which is stable. There is
multilevel degenerative disc disease of the thoracic spine. There
are multiple old right lateral rib fractures present. There is
calcification in the wall of the aortic arch.
IMPRESSION: Chronic bronchitic changes. Subsegmental atelectasis or new
infiltrate in the lower retrosternal region likely on the left.
Followup PA and lateral chest X-ray is recommended in 3-4 weeks
following trial of antibiotic therapy to ensure resolution and
exclude underlying malignancy.

Thoracic aortic atherosclerosis.

## 2019-02-26 ENCOUNTER — Other Ambulatory Visit: Payer: Self-pay | Admitting: Family Medicine

## 2019-02-28 ENCOUNTER — Ambulatory Visit (INDEPENDENT_AMBULATORY_CARE_PROVIDER_SITE_OTHER): Payer: Medicare HMO | Admitting: Psychiatry

## 2019-02-28 ENCOUNTER — Other Ambulatory Visit: Payer: Self-pay

## 2019-02-28 ENCOUNTER — Encounter (HOSPITAL_COMMUNITY): Payer: Self-pay | Admitting: Psychiatry

## 2019-02-28 DIAGNOSIS — G4709 Other insomnia: Secondary | ICD-10-CM

## 2019-02-28 DIAGNOSIS — F39 Unspecified mood [affective] disorder: Secondary | ICD-10-CM | POA: Diagnosis not present

## 2019-02-28 DIAGNOSIS — F0630 Mood disorder due to known physiological condition, unspecified: Secondary | ICD-10-CM

## 2019-02-28 MED ORDER — LAMOTRIGINE 100 MG PO TABS: 100.0000 mg | ORAL_TABLET | Freq: Every day | ORAL | 0 refills | Status: DC

## 2019-02-28 NOTE — Patient Instructions (Signed)
1. Increase lamotrigine 100 mg daily  2. Continue amitriptyline 200 mg at night  3. Next appointment: 11/4 at 11 AM

## 2019-03-01 ENCOUNTER — Other Ambulatory Visit: Payer: Self-pay | Admitting: Family Medicine

## 2019-03-01 ENCOUNTER — Encounter: Payer: Self-pay | Admitting: Family Medicine

## 2019-03-01 ENCOUNTER — Ambulatory Visit (INDEPENDENT_AMBULATORY_CARE_PROVIDER_SITE_OTHER): Payer: Medicare HMO | Admitting: Family Medicine

## 2019-03-01 DIAGNOSIS — F411 Generalized anxiety disorder: Secondary | ICD-10-CM

## 2019-03-01 DIAGNOSIS — F0630 Mood disorder due to known physiological condition, unspecified: Secondary | ICD-10-CM | POA: Diagnosis not present

## 2019-03-01 MED ORDER — LORAZEPAM 0.5 MG PO TABS: 0.5000 mg | ORAL_TABLET | Freq: Two times a day (BID) | ORAL | 2 refills | Status: DC | PRN

## 2019-03-01 NOTE — Addendum Note (Signed)
Addended by: [name] on: 03/01/2019 04:53 PM   Modules accepted: Orders

## 2019-03-01 NOTE — Progress Notes (Addendum)
Subjective:    Patient ID: Brittany Conrad, female    DOB: 06-Dec-1950, 68 y.o.   MRN: [id number]   HPI: Brittany Conrad is a 68 y.o. female presenting for anxiety off the wall. On lamotrigine. Still depressed. Dose increased yesterday.Dr. Manual Meier declined to fill the ativan.Relaxes her. Takes away the shakes. Has decreased the vicoprofen. Now pain In back to bad for her to get around.  GAD 7 : Generalized Anxiety Score 03/01/2019  Nervous, Anxious, on Edge 3  Control/stop worrying 3  Worry too much - different things 3  Trouble relaxing 3  Restless 3  Easily annoyed or irritable 3  Afraid - awful might happen 2  Total GAD 7 Score 20     Depression screen [facility] 2/9 05/10/2018 03/09/2018  Decreased Interest 0 0  Down, Depressed, Hopeless 0 0  PHQ - 2 Score 0 0     Relevant past medical, surgical, family and social history reviewed and updated as indicated.  Interim medical history since our last visit reviewed. Allergies and medications reviewed and updated.  ROS:  Review of Systems  Constitutional: Negative.   HENT: Negative.   Eyes: Negative for visual disturbance.  Respiratory: Negative for shortness of breath.   Cardiovascular: Negative for chest pain.  Gastrointestinal: Negative for abdominal pain.  Musculoskeletal: Positive for arthralgias, back pain and gait problem.  Psychiatric/Behavioral: Positive for agitation and decreased concentration. The patient is nervous/anxious.      Social History   Tobacco Use  Smoking Status Former Smoker  Smokeless Tobacco Never Used       Objective:     Wt Readings from Last 3 Encounters:  05/10/18 164 lb 3.2 oz (74.5 kg)  03/09/18 164 lb (74.4 kg)     Exam deferred. Pt. Harboring due to COVID 19. Phone visit performed.   Assessment & Plan:   1. Mood disorder in conditions classified elsewhere   2. GAD (generalized anxiety disorder)     Meds ordered this encounter  Medications  . LORazepam (ATIVAN) 0.5 MG tablet   Sig: Take 1 tablet (0.5 mg total) by mouth 2 (two) times daily as needed for anxiety.    Dispense:  60 tablet    Refill:  2        Diagnoses and all orders for this visit:  Mood disorder in conditions classified elsewhere  GAD (generalized anxiety disorder)  Other orders -     LORazepam (ATIVAN) 0.5 MG tablet; Take 1 tablet (0.5 mg total) by mouth 2 (two) times daily as needed for anxiety.    Virtual Visit via telephone Note  I discussed the limitations, risks, security and privacy concerns of performing an evaluation and management service by telephone and the availability of in person appointments. The patient was identified with two identifiers. Pt.expressed understanding and agreed to proceed. Pt. Is at home. Dr. Livia Snellen is in his office.  Follow Up Instructions:   I discussed the assessment and treatment plan with the patient. The patient was provided an opportunity to ask questions and all were answered. The patient agreed with the plan and demonstrated an understanding of the instructions.   The patient was advised to call back or seek an in-person evaluation if the symptoms worsen or if the condition fails to improve as anticipated.   Total minutes including chart review and phone contact time: 24   Follow up plan: Return in about 3 months (around 06/01/2019), or if symptoms worsen or fail to improve.  [name], MD Tristan Schroeder  [place]

## 2019-03-12 ENCOUNTER — Other Ambulatory Visit: Payer: Self-pay | Admitting: Family Medicine

## 2019-03-23 ENCOUNTER — Other Ambulatory Visit: Payer: Self-pay | Admitting: Family Medicine

## 2019-03-30 ENCOUNTER — Other Ambulatory Visit: Payer: Self-pay | Admitting: Family Medicine

## 2019-04-01 IMAGING — DX DG LUMBAR SPINE 2-3V
2 series · 2 of 2 positions shown · non-contrast
Comparison: None.

CLINICAL DATA: Chronic low back pain.

EXAM:
LUMBAR SPINE - 2-3 VIEW

[l-spine ap]
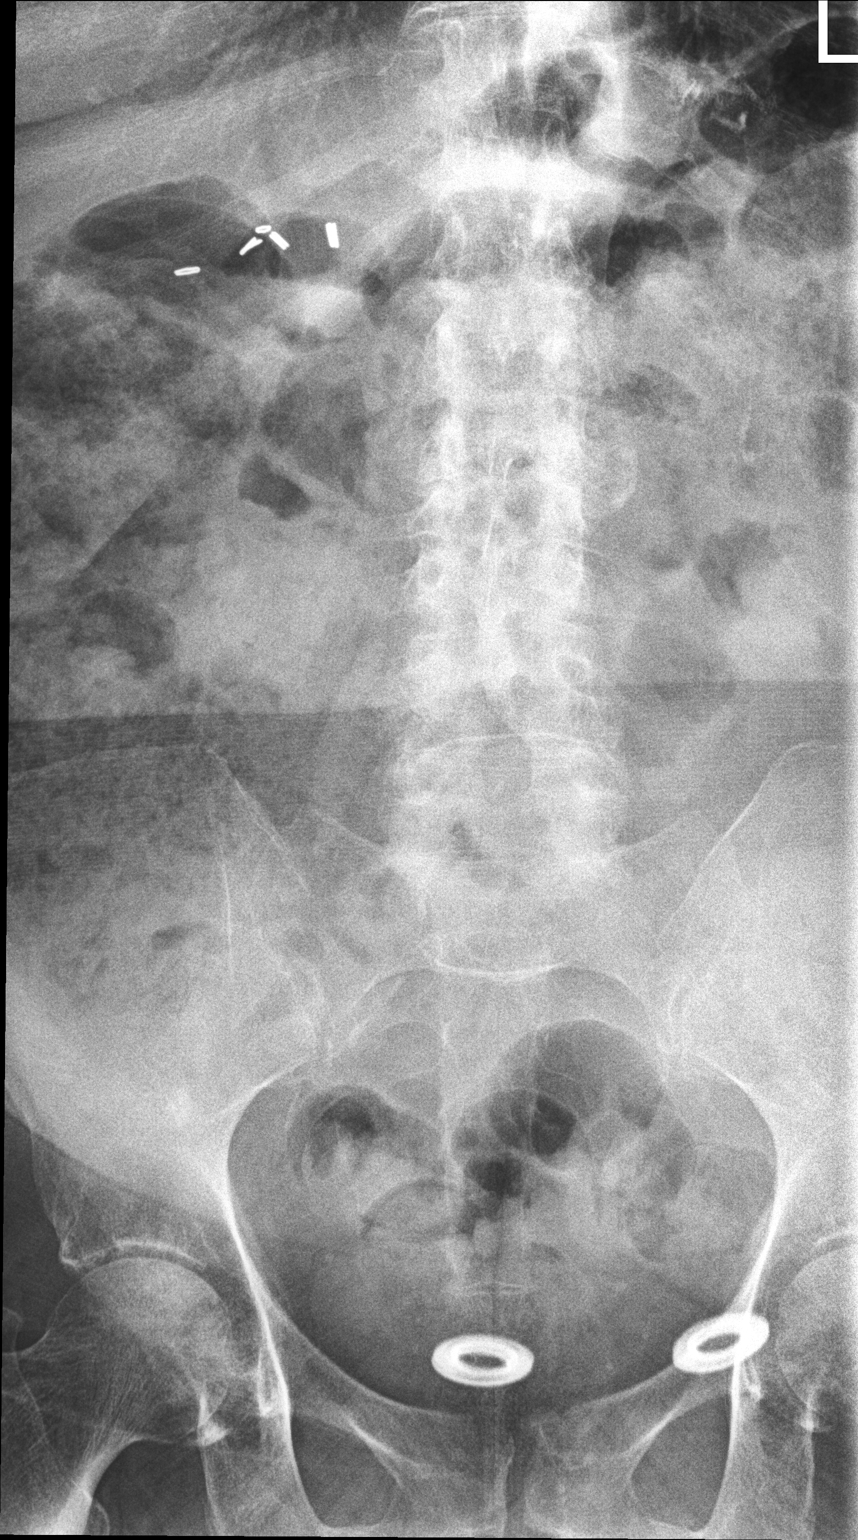

[l-spine lat]
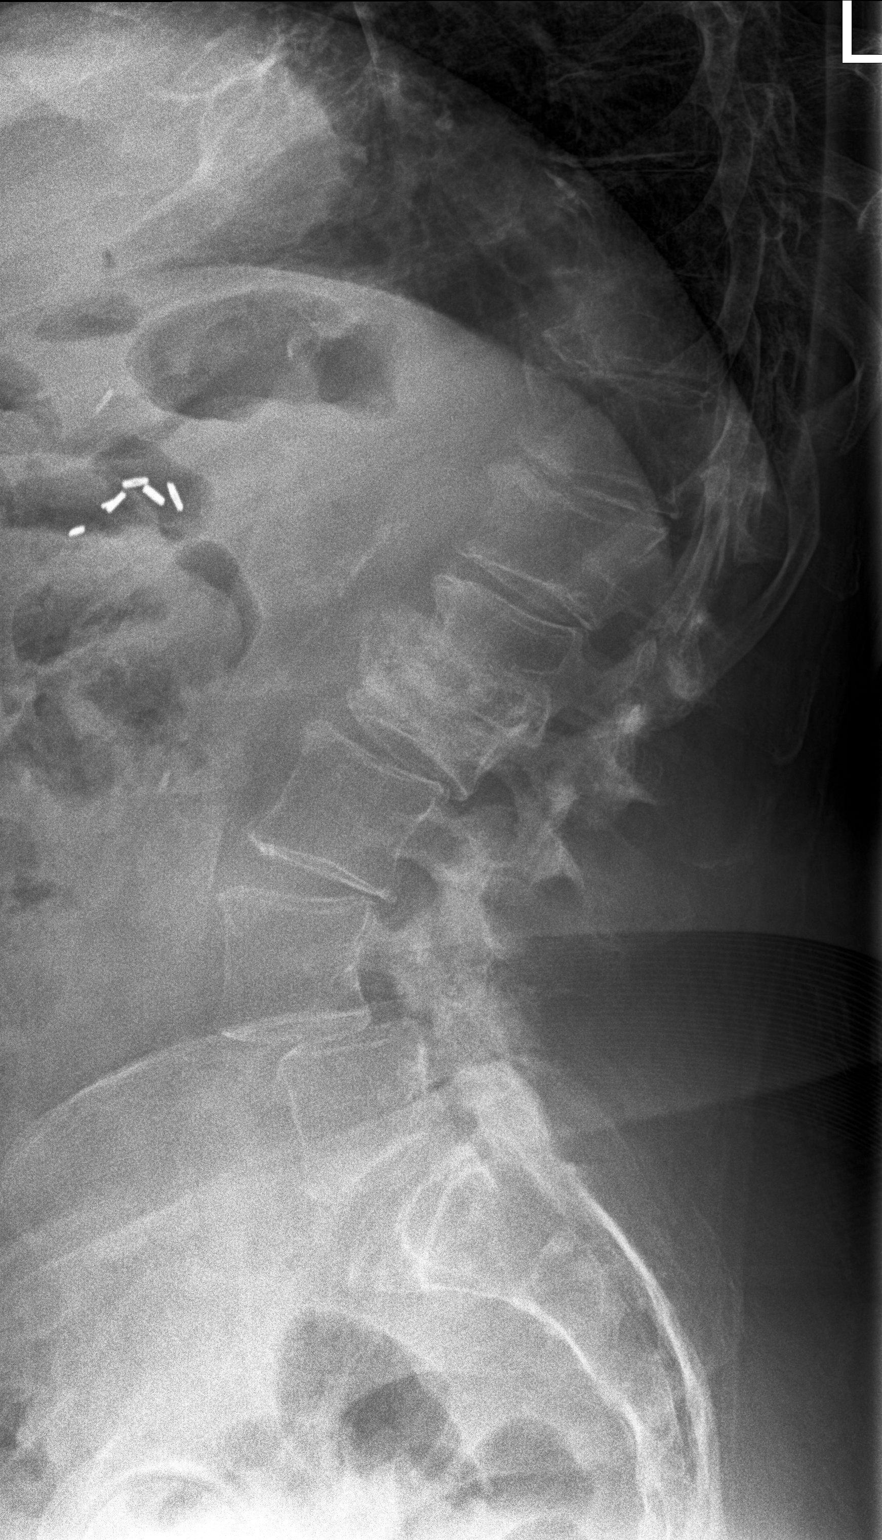

[2 of 2 positions shown; findings below may reference images not displayed]

FINDINGS: The patient has a chronic appearing compression fracture of L2 with
vertebral plana deformity identified. Mild, chronic superior
endplate compression fracture of T12 is also noted. There is
exaggeration of the normal lumbar lordosis and 1 cm facet mediated
anterolisthesis L4 on L5. Trace retrolisthesis L2 on L3 and L3 on L4
is also seen. Loss of disc space height at all levels appears worst
at L1-2 and L4-5. Paraspinous structures demonstrate a massive
volume of stool in the visualized colon and aortic atherosclerosis.
IMPRESSION: Advanced multilevel spondylosis.

Chronic appearing compression fractures of T12 and L2.

Massive colonic stool burden consistent with constipation.

## 2019-04-06 ENCOUNTER — Telehealth: Payer: Self-pay | Admitting: Family Medicine

## 2019-04-06 MED ORDER — AMITRIPTYLINE HCL 100 MG PO TABS: 200.0000 mg | ORAL_TABLET | Freq: Every day | ORAL | 1 refills | Status: DC
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
## 2019-05-09 ENCOUNTER — Ambulatory Visit (INDEPENDENT_AMBULATORY_CARE_PROVIDER_SITE_OTHER): Payer: Medicare HMO | Admitting: Psychiatry

## 2019-05-09 ENCOUNTER — Encounter (HOSPITAL_COMMUNITY): Payer: Self-pay | Admitting: Psychiatry

## 2019-05-09 ENCOUNTER — Other Ambulatory Visit: Payer: Self-pay

## 2019-05-09 DIAGNOSIS — F0630 Mood disorder due to known physiological condition, unspecified: Secondary | ICD-10-CM

## 2019-05-09 MED ORDER — LAMOTRIGINE 100 MG PO TABS: 100.0000 mg | ORAL_TABLET | Freq: Every day | ORAL | 0 refills | Status: DC

## 2019-05-09 NOTE — Patient Instructions (Signed)
1.Conitnue lamotrigine100 mg daily  2. Continue amitriptyline 200 mg at night  3. Next appointment:in January

## 2019-05-22 ENCOUNTER — Ambulatory Visit (INDEPENDENT_AMBULATORY_CARE_PROVIDER_SITE_OTHER): Payer: Medicare HMO | Admitting: Family Medicine

## 2019-05-22 ENCOUNTER — Encounter: Payer: Self-pay | Admitting: Family Medicine

## 2019-05-22 DIAGNOSIS — F1320 Sedative, hypnotic or anxiolytic dependence, uncomplicated: Secondary | ICD-10-CM

## 2019-05-22 DIAGNOSIS — J449 Chronic obstructive pulmonary disease, unspecified: Secondary | ICD-10-CM

## 2019-05-22 DIAGNOSIS — G894 Chronic pain syndrome: Secondary | ICD-10-CM | POA: Diagnosis not present

## 2019-05-22 DIAGNOSIS — F411 Generalized anxiety disorder: Secondary | ICD-10-CM | POA: Diagnosis not present

## 2019-05-22 MED ORDER — LORAZEPAM 0.5 MG PO TABS: 0.5000 mg | ORAL_TABLET | Freq: Two times a day (BID) | ORAL | 2 refills | Status: DC | PRN

## 2019-05-22 NOTE — Progress Notes (Signed)
Subjective:  Patient ID: Brittany Conrad, female    DOB: 10-Oct-1950  Age: 68 y.o. MRN: [id number]  CC: No chief complaint on file.   HPI Brittany Conrad presents for worry about getting the sickness. During the day I'm crazy - having to stay in to avoid COVID. I cry during the day. I don't know anyone to call. No longer taking vicoprofen. Off for a few months. Pain  Is stable on current regimen. However, she was asked not to return by [facility] and needs a new pain clinic. Currently she does worry a lot. Mainly because there is nothing to do. She is lonely and isolated. Her sister does bring in supplies for her.   Depression screen [facility] 2/9 05/10/2018 03/09/2018  Decreased Interest 0 0  Down, Depressed, Hopeless 0 0  PHQ - 2 Score 0 0    History Kimberly has a past medical history of Asthma, COPD (chronic obstructive pulmonary disease) (HCC), Emphysema lung (HCC), and Scoliosis.   She has a past surgical history that includes Abdominal hysterectomy; Gallbladder surgery; Breast reduction surgery; tummy tuck; and Back surgery.   Her family history includes Cancer in her father and mother.She reports that she has quit smoking. She has never used smokeless tobacco. No history on file for alcohol and drug.    ROS Review of Systems  Constitutional: Negative.   HENT: Negative.   Eyes: Negative for visual disturbance.  Respiratory: Negative for shortness of breath.   Cardiovascular: Negative for chest pain.  Gastrointestinal: Negative for abdominal pain.  Musculoskeletal: Positive for back pain. Negative for arthralgias.  Psychiatric/Behavioral: Positive for agitation. The patient is nervous/anxious.     Objective:  There were no vitals taken for this visit.  BP Readings from Last 3 Encounters:  05/10/18 123/81  03/31/18 137/87  03/09/18 133/86    Wt Readings from Last 3 Encounters:  05/10/18 164 lb 3.2 oz (74.5 kg)  03/09/18 164 lb (74.4 kg)     Physical Exam  Exam deferred. Pt.  [place] due to COVID 19. Phone visit performed.   Assessment & Plan:   Diagnoses and all orders for this visit:  Chronic obstructive pulmonary disease, unspecified COPD type (HCC)  GAD (generalized anxiety disorder)  Benzodiazepine dependence, continuous (HCC)  Chronic pain syndrome -     Pain Clinic  Other orders -     LORazepam (ATIVAN) 0.5 MG tablet; Take 1 tablet (0.5 mg total) by mouth 2 (two) times daily as needed for anxiety.       I am having Brittany Conrad maintain her docusate sodium, traMADol, pantoprazole, guaiFENesin, folic acid, Fluticasone-Umeclidin-Vilant, Vitamin D3, albuterol, ibuprofen, furosemide, Klor-Con M20, amitriptyline, lamoTRIgine, and LORazepam.  Allergies as of 05/22/2019   No Known Allergies     Medication List       Accurate as of May 22, 2019  8:57 AM. If you have any questions, ask your nurse or doctor.        albuterol (2.5 MG/3ML) 0.083% nebulizer solution Commonly known as: PROVENTIL Take 3 mLs (2.5 mg total) by nebulization every 6 (six) hours as needed for wheezing or shortness of breath.   amitriptyline 100 MG tablet Commonly known as: ELAVIL Take 2 tablets (200 mg total) by mouth at bedtime.   docusate sodium 100 MG capsule Commonly known as: COLACE Take 1 capsule (100 mg total) by mouth daily as needed for mild constipation.   Fluticasone-Umeclidin-Vilant 100-62.5-25 MCG/INH Aepb Commonly known as: Trelegy Ellipta Inhale 1 puff into the lungs daily.  folic acid 1 MG tablet Commonly known as: FOLVITE Take 1 tablet (1 mg total) by mouth daily.   furosemide 40 MG tablet Commonly known as: LASIX TAKE 1 TABLET BY MOUTH TWICE A DAY   guaiFENesin 600 MG 12 hr tablet Commonly known as: MUCINEX Take 2 tablets (1,200 mg total) by mouth 2 (two) times daily.   ibuprofen 800 MG tablet Commonly known as: ADVIL Take 800 mg by mouth 3 (three) times daily as needed.   Klor-Con M20 20 MEQ tablet Generic drug:  potassium chloride SA TAKE 2 TABLETS (40 MEQ TOTAL) BY MOUTH 3 (THREE) TIMES DAILY. FOR POTASSIUM REPLACEMENT/ SUPPLEMENT   lamoTRIgine 100 MG tablet Commonly known as: LAMICTAL Take 1 tablet (100 mg total) by mouth daily. Start taking on: May 30, 2019   LORazepam 0.5 MG tablet Commonly known as: ATIVAN Take 1 tablet (0.5 mg total) by mouth 2 (two) times daily as needed for anxiety.   pantoprazole 40 MG tablet Commonly known as: PROTONIX Take 1 tablet (40 mg total) by mouth 2 (two) times daily.   traMADol 50 MG tablet Commonly known as: ULTRAM Take 1-2 tablets (50-100 mg total) by mouth every 6 (six) hours as needed. for pain   Vitamin D3 25 MCG (1000 UT) Caps Take 1 capsule (1,000 Units total) by mouth daily.      Virtual Visit via telephone Note  I discussed the limitations, risks, security and privacy concerns of performing an evaluation and management service by telephone and the availability of in person appointments. I also discussed with the patient that there may be a patient responsible charge related to this service. The patient expressed understanding and agreed to proceed. Pt. Is at home. Dr. Livia Snellen is in his office.  Follow Up Instructions:   I discussed the assessment and treatment plan with the patient. The patient was provided an opportunity to ask questions and all were answered. The patient agreed with the plan and demonstrated an understanding of the instructions.   The patient was advised to call back or seek an in-person evaluation if the symptoms worsen or if the condition fails to improve as anticipated.  Total minutes including chart review and phone contact time: 20   Follow-up: Return in about 3 months (around 08/22/2019).  [name], M.D.

## 2019-05-29 ENCOUNTER — Other Ambulatory Visit: Payer: Self-pay | Admitting: Family Medicine

## 2019-05-29 DIAGNOSIS — G894 Chronic pain syndrome: Secondary | ICD-10-CM

## 2019-06-07 ENCOUNTER — Other Ambulatory Visit: Payer: Self-pay | Admitting: Family Medicine

## 2019-06-11 ENCOUNTER — Telehealth: Payer: Self-pay | Admitting: Family Medicine

## 2019-06-11 NOTE — Telephone Encounter (Addendum)
Patient states that she went to [facility] last Friday and they drew blood and told her she was severely anemic and she should follow up with her PCP. I have called and left a message with [facility] to try and get a copy of her labs. I am awaiting a return call. Patient does not want to come in for more lab work. She wants Dr Livia Snellen to review the labwork and let her know what to do. Patient doesn't know what her HgB or any numbers were.

## 2019-06-20 ENCOUNTER — Telehealth: Payer: Self-pay

## 2019-06-20 NOTE — Telephone Encounter (Signed)
Patient states that she has an apt tomorrow.

## 2019-06-20 NOTE — Telephone Encounter (Signed)
See if she can come in today for office visit and lab

## 2019-06-20 NOTE — Telephone Encounter (Signed)
Received call from the [facility] and she states that they have received 6 calls from the patient recently for falls and being dizzy. She tells them that she is anemic and she has also called here to tell us the same thing. She told us in a previous telephone message that they did labwork at her pain clinic and advised that she was anemic and she should contact her PCP. I have tried numerous times to contact the pain clinic to get her HgB value faxed or a verbal and cannot reach them. I have also left messages. Patient does not know what her values were either. I have advised patient that she could come in to the office so we could check her labs and she refuses to come in. The paramedics also advised her to come in to our office for labs an she told them she didn't want to come in either. When we last spoke with patient she was advised to contact her pain clinic and have them send her labwork over. The patient is wanting you to start her on some iron. Please advise

## 2019-06-26 ENCOUNTER — Other Ambulatory Visit: Payer: Self-pay | Admitting: Family Medicine

## 2019-07-03 ENCOUNTER — Other Ambulatory Visit: Payer: Self-pay | Admitting: Nurse Practitioner

## 2019-07-17 ENCOUNTER — Ambulatory Visit (HOSPITAL_COMMUNITY): Payer: Medicare HMO | Admitting: Psychiatry

## 2019-07-23 ENCOUNTER — Other Ambulatory Visit: Payer: Self-pay | Admitting: Family Medicine

## 2019-07-30 ENCOUNTER — Other Ambulatory Visit: Payer: Self-pay

## 2019-07-30 ENCOUNTER — Ambulatory Visit (INDEPENDENT_AMBULATORY_CARE_PROVIDER_SITE_OTHER): Payer: Medicare HMO | Admitting: Psychiatry

## 2019-07-30 ENCOUNTER — Encounter: Payer: Self-pay | Admitting: Psychiatry

## 2019-07-30 DIAGNOSIS — F3131 Bipolar disorder, current episode depressed, mild: Secondary | ICD-10-CM | POA: Diagnosis not present

## 2019-07-30 MED ORDER — ZIPRASIDONE HCL 20 MG PO CAPS: 20.0000 mg | ORAL_CAPSULE | Freq: Two times a day (BID) | ORAL | 1 refills | Status: DC

## 2019-07-30 NOTE — Progress Notes (Signed)
BH MD OP Progress Note  Virtual Visit via Telephone Note  I connected with Brittany Conrad on 07/30/19 at 11:00 AM EST by telephone and verified that I am speaking with the correct person using two identifiers.  I discussed the limitations, risks, security and privacy concerns of performing an evaluation and management service by telephone and the availability of in person appointments. I also discussed with the patient that there may be a patient responsible charge related to this service. The patient expressed understanding and agreed to proceed.    07/30/2019 11:09 AM Brittany Conrad  MRN:  [id number]  Chief Complaint: " I am okay."  HPI: Brittany Conrad is a 69 y.o. year old female with a history of mood disorder,COPD,CHF,S/p gastric bypass per patient's report , who was contacted via phone for follow-up. Patient reported that she continues to feel miserable.  She stated that she moved down here from [place] and the only person she knows here is her sister.  She feels lonely at times.  She has mood swings.  She reported that she thought the dose of Lamictal was increased to 200 mg at the time of her last visit with Dr. Vanetta Shawl.  Due to that she was taking 2 100 mg tablets of Lamictal daily until she ran out about a week ago.  Since she ran out of them she has not really been able to tell a big difference.  She does not think they were doing any good for her mood. She stated she wants to try a different medication. As per EMR, patient has been tried on several different medications in the past- lithium, Abilify (self discontinued), olanzapine, latuda(could not afford), clonazepam, trazodone.  Patient has never taken Geodon for mood stabilization.  She was offered to try it.  Patient agreed to give it a shot.  She has been taking amitriptyline 200 mg at bedtime regularly.  Visit Diagnosis:    ICD-10-CM   1. Bipolar affective disorder, currently depressed, mild (HCC)  F31.31     Past  Psychiatric History: Bipolar disorder  Past Medical History:  Past Medical History:  Diagnosis Date  . Asthma   . COPD (chronic obstructive pulmonary disease) (HCC)   . Emphysema lung (HCC)   . Scoliosis     Past Surgical History:  Procedure Laterality Date  . ABDOMINAL HYSTERECTOMY    . BACK SURGERY    . BREAST REDUCTION SURGERY    . GALLBLADDER SURGERY    . tummy tuck      Family Psychiatric History: denied  Family History:  Family History  Problem Relation Age of Onset  . Cancer Mother   . Cancer Father     Social History:  Social History   Socioeconomic History  . Marital status: Widowed    Spouse name: Not on file  . Number of children: Not on file  . Years of education: Not on file  . Highest education level: Not on file  Occupational History  . Not on file  Tobacco Use  . Smoking status: Former Games developer  . Smokeless tobacco: Never Used  Substance and Sexual Activity  . Alcohol use: Not on file  . Drug use: Not on file  . Sexual activity: Not on file  Other Topics Concern  . Not on file  Social History Narrative  . Not on file   Social Determinants of Health   Financial Resource Strain:   . Difficulty of Paying Living Expenses: Not on file  Food Insecurity:   .  Worried About Charity fundraiser in the Last Year: Not on file  . Ran Out of Food in the Last Year: Not on file  Transportation Needs:   . Lack of Transportation (Medical): Not on file  . Lack of Transportation (Non-Medical): Not on file  Physical Activity:   . Days of Exercise per Week: Not on file  . Minutes of Exercise per Session: Not on file  Stress:   . Feeling of Stress : Not on file  Social Connections:   . Frequency of Communication with Friends and Family: Not on file  . Frequency of Social Gatherings with Friends and Family: Not on file  . Attends Religious Services: Not on file  . Active Member of Clubs or Organizations: Not on file  . Attends Archivist  Meetings: Not on file  . Marital Status: Not on file    Allergies: No Known Allergies  Metabolic Disorder Labs: No results found for: HGBA1C, MPG No results found for: PROLACTIN Lab Results  Component Value Date   CHOL 188 03/09/2018   TRIG 210 (H) 03/09/2018   HDL 57 03/09/2018   CHOLHDL 3.3 03/09/2018   LDLCALC 89 03/09/2018   No results found for: TSH  Therapeutic Level Labs: No results found for: LITHIUM No results found for: VALPROATE No components found for:  CBMZ  Current Medications: Current Outpatient Medications  Medication Sig Dispense Refill  . pantoprazole (PROTONIX) 40 MG tablet TAKE 1 TABLET BY MOUTH TWICE A DAY 180 tablet 3  . albuterol (PROVENTIL) (2.5 MG/3ML) 0.083% nebulizer solution Take 3 mLs (2.5 mg total) by nebulization every 6 (six) hours as needed for wheezing or shortness of breath. 150 mL 1  . amitriptyline (ELAVIL) 100 MG tablet Take 2 tablets (200 mg total) by mouth at bedtime. 180 tablet 1  . CVS D3 25 MCG (1000 UT) capsule TAKE 1 CAPSULE (1,000 UNITS TOTAL) BY MOUTH DAILY. 120 capsule 11  . docusate sodium (COLACE) 100 MG capsule Take 1 capsule (100 mg total) by mouth daily as needed for mild constipation. 60 capsule 11  . Fluticasone-Umeclidin-Vilant (TRELEGY ELLIPTA) 100-62.5-25 MCG/INH AEPB Inhale 1 puff into the lungs daily. 28 each 11  . folic acid (FOLVITE) 1 MG tablet Take 1 tablet (1 mg total) by mouth daily. 30 tablet 11  . furosemide (LASIX) 40 MG tablet TAKE 1 TABLET BY MOUTH TWICE A DAY 180 tablet 0  . guaiFENesin (MUCINEX) 600 MG 12 hr tablet Take 2 tablets (1,200 mg total) by mouth 2 (two) times daily. 60 tablet 11  . ibuprofen (ADVIL,MOTRIN) 800 MG tablet Take 800 mg by mouth 3 (three) times daily as needed.  0  . KLOR-CON M20 20 MEQ tablet TAKE 2 TABLETS (40 MEQ TOTAL) BY MOUTH 3 (THREE) TIMES DAILY. FOR POTASSIUM REPLACEMENT/ SUPPLEMENT 540 tablet 1  . lamoTRIgine (LAMICTAL) 100 MG tablet Take 1 tablet (100 mg total) by mouth  daily. 90 tablet 0  . LORazepam (ATIVAN) 0.5 MG tablet Take 1 tablet (0.5 mg total) by mouth 2 (two) times daily as needed for anxiety. 60 tablet 2  . traMADol (ULTRAM) 50 MG tablet Take 1-2 tablets (50-100 mg total) by mouth every 6 (six) hours as needed. for pain 40 tablet 0   No current facility-administered medications for this visit.       Psychiatric Specialty Exam: Review of Systems  There were no vitals taken for this visit.There is no height or weight on file to calculate BMI.  General Appearance: Unable  to assess due to phone visit  Eye Contact: Unable to assess due to phone visit  Speech:  Clear and Coherent and Normal Rate  Volume:  Normal  Mood:  Euthymic  Affect:  Congruent  Thought Process:  Goal Directed and Descriptions of Associations: Intact  Orientation:  Full (Time, Place, and Person)  Thought Content: Logical   Suicidal Thoughts:  No  Homicidal Thoughts:  No  Memory:  Immediate;   Good Recent;   Good  Judgement:  Fair  Insight:  Fair  Psychomotor Activity:  Normal  Concentration:  Concentration: Good and Attention Span: Fair  Recall:  Fair  Fund of Knowledge: Good  Language: Good  Akathisia:  Negative  Handed:  Right  AIMS (if indicated): not done  Assets:  Communication Skills Desire for Improvement Financial Resources/Insurance Housing  ADL's:  Intact  Cognition: WNL  Sleep:  Fair   Screenings: GAD-7     Office Visit from 03/01/2019 in [facility]  Total GAD-7 Score  20    PHQ2-9     Office Visit from 05/10/2018 in [facility] Office Visit from 03/09/2018 in [facility]  PHQ-2 Total Score  0  0       Assessment and Plan: Patient reported ongoing mood symptoms.  She ran out of Lamictal after taking a higher dose once daily.  She has been out of it for about a week and has not been able to tell any difference.  She was offered Geodon for mood stabilization.  Patient was  agreeable to try it.  Potential side effects of medication and risks vs benefits of treatment vs non-treatment were explained and discussed. All questions were answered.  1. Bipolar affective disorder, currently depressed, mild (HCC)  - Start ziprasidone (GEODON) 20 MG capsule; Take 1 capsule (20 mg total) by mouth 2 (two) times daily with a meal.  Dispense: 60 capsule; Refill: 1 - Continue Elavil 200 mg at bedtime prescribed by another provider. - Continue lorazepam 0.5 mg twice daily as needed prescribed by another provider  F/up in 6 weeks.     Zena Amos, MD 07/30/2019, 11:09 AM

## 2019-08-14 ENCOUNTER — Other Ambulatory Visit: Payer: Self-pay | Admitting: Family Medicine

## 2019-08-16 ENCOUNTER — Ambulatory Visit (INDEPENDENT_AMBULATORY_CARE_PROVIDER_SITE_OTHER): Payer: Medicare HMO | Admitting: Family Medicine

## 2019-08-16 ENCOUNTER — Encounter: Payer: Self-pay | Admitting: Family Medicine

## 2019-08-16 DIAGNOSIS — F3131 Bipolar disorder, current episode depressed, mild: Secondary | ICD-10-CM | POA: Diagnosis not present

## 2019-08-16 DIAGNOSIS — F411 Generalized anxiety disorder: Secondary | ICD-10-CM

## 2019-08-16 DIAGNOSIS — J449 Chronic obstructive pulmonary disease, unspecified: Secondary | ICD-10-CM

## 2019-08-16 MED ORDER — LORAZEPAM 0.5 MG PO TABS: 0.5000 mg | ORAL_TABLET | Freq: Two times a day (BID) | ORAL | 0 refills | Status: DC | PRN

## 2019-08-16 MED ORDER — TRELEGY ELLIPTA 100-62.5-25 MCG/INH IN AEPB: 1.0000 | INHALATION_SPRAY | Freq: Every day | RESPIRATORY_TRACT | 11 refills | Status: DC

## 2019-08-16 NOTE — Progress Notes (Signed)
Subjective:    Patient ID: Brittany Conrad, female    DOB: April 26, 1951, 69 y.o.   MRN: [id number]   HPI: Brittany Conrad is a 69 y.o. female presenting for bipolar follow up. Going crazy in the afternoons. Nothing to do. Med wearing off from morning dose of lorazepam. Having some manic symptoms. Needs refills of her ativan. It seems to wear off in the afternon so she would like to have a third dose so the interval can be shortened. She is is working with psychiatry, Dr. Toy Care. She recently placed pt. On geodon 20 mg - about 2 weeks ago. Pt. Still getting used to it. Hasn't seen much improvement in her manic tendencies just yet.   She also has COPD. No longer taking anything for it. Says that she does get DOE. As we were talking she began wheezing and became tachypneic that could be heard over the phone.   Depression screen [facility] 2/9 05/10/2018 03/09/2018  Decreased Interest 0 0  Down, Depressed, Hopeless 0 0  PHQ - 2 Score 0 0     Relevant past medical, surgical, family and social history reviewed and updated as indicated.  Interim medical history since our last visit reviewed. Allergies and medications reviewed and updated.  ROS:  Review of Systems  Constitutional: Negative.   HENT: Negative.   Eyes: Negative for visual disturbance.  Respiratory: Negative for shortness of breath.   Cardiovascular: Negative for chest pain.  Gastrointestinal: Negative for abdominal pain.  Musculoskeletal: Negative for arthralgias.  Psychiatric/Behavioral: Positive for agitation. Negative for sleep disturbance and suicidal ideas. The patient is nervous/anxious.      Social History   Tobacco Use  Smoking Status Former Smoker  Smokeless Tobacco Never Used       Objective:     Wt Readings from Last 3 Encounters:  05/10/18 164 lb 3.2 oz (74.5 kg)  03/09/18 164 lb (74.4 kg)     Exam deferred. Pt. Harboring due to COVID 19. Phone visit performed.   Assessment & Plan:   1. GAD (generalized  anxiety disorder)   2. Bipolar affective disorder, currently depressed, mild ([place])   3. Chronic obstructive pulmonary disease, unspecified COPD type ([place])     Meds ordered this encounter  Medications  . Fluticasone-Umeclidin-Vilant (TRELEGY ELLIPTA) 100-62.5-25 MCG/INH AEPB    Sig: Inhale 1 puff into the lungs daily.    Dispense:  28 each    Refill:  11  . LORazepam (ATIVAN) 0.5 MG tablet    Sig: Take 1 tablet (0.5 mg total) by mouth 2 (two) times daily as needed for anxiety.    Dispense:  60 tablet    Refill:  0    No orders of the defined types were placed in this encounter.     Diagnoses and all orders for this visit:  GAD (generalized anxiety disorder) -     LORazepam (ATIVAN) 0.5 MG tablet; Take 1 tablet (0.5 mg total) by mouth 2 (two) times daily as needed for anxiety.  Bipolar affective disorder, currently depressed, mild ([place])  Chronic obstructive pulmonary disease, unspecified COPD type ([place]) -     Fluticasone-Umeclidin-Vilant (TRELEGY ELLIPTA) 100-62.5-25 MCG/INH AEPB; Inhale 1 puff into the lungs daily.  Expressed to the patient my concerns that increasing the lorazepam could interfere with her COPD.  I asked her to resume Trelegy inhaler.  This should help her with the wheezing that we discussed during the phone call.  Additionally it may give her better threshold for respiratory depression  coming from benzodiazepines.  However we did discuss that management of her psychoactive medicines is best in the hands of her psychiatrist at this point.  I do not mind renewing her Ativan, but she needs to have changes of dose and new medicines introduced by her psychiatrist.  I advised that she should contact him, Dr. Zena Amos since he is the one that prescribed at the Geodon 2 to 3 weeks ago.  Virtual Visit via telephone Note  I discussed the limitations, risks, security and privacy concerns of performing an evaluation and management service by telephone and the availability  of in person appointments. The patient was identified with two identifiers. Pt.expressed understanding and agreed to proceed. Pt. Is at home. Dr. Darlyn Read is in his office.  Follow Up Instructions:   I discussed the assessment and treatment plan with the patient. The patient was provided an opportunity to ask questions and all were answered. The patient agreed with the plan and demonstrated an understanding of the instructions.   The patient was advised to call back or seek an in-person evaluation if the symptoms worsen or if the condition fails to improve as anticipated.   Total minutes including chart review and phone contact time: 28   Follow up plan: Return in about 1 month (around 09/13/2019).  Mechele Claude, MD [facility]
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
## 2019-09-04 ENCOUNTER — Ambulatory Visit (INDEPENDENT_AMBULATORY_CARE_PROVIDER_SITE_OTHER): Payer: Medicare HMO

## 2019-09-04 DIAGNOSIS — Z0000 Encounter for general adult medical examination without abnormal findings: Secondary | ICD-10-CM | POA: Diagnosis not present
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
## 2019-09-04 NOTE — Progress Notes (Signed)
MEDICARE ANNUAL WELLNESS VISIT  09/04/2019  Telephone Visit Disclaimer This Medicare AWV was conducted by telephone due to national recommendations for restrictions regarding the COVID-19 Pandemic (e.g. social distancing).  I verified, using two identifiers, that I am speaking with Brittany Conrad or their authorized healthcare agent. I discussed the limitations, risks, security, and privacy concerns of performing an evaluation and management service by telephone and the potential availability of an in-person appointment in the future. The patient expressed understanding and agreed to proceed.   Subjective:  Brittany Conrad is a 69 y.o. female patient of Stacks, Broadus John, MD who had a Medicare Annual Wellness Visit today via telephone. [place] is Disabled and lives alone. She has two children, a son and a daughter.  She reports that she is socially active and does interact with friends/family regularly. is minimally physically active and enjoys working with plants and growing new things.  Patient Care Team: Mechele Claude, MD as PCP - General (Family Medicine) Ellwood Sayers, MD as Referring Physician (Physical Medicine and Rehabilitation)  Advanced Directives 09/04/2019  Does Patient Have a Medical Advance Directive? No  Would patient like information on creating a medical advance directive? No - Patient declined    Hospital Utilization Over the Past 12 Months: # of hospitalizations or ER visits: 0 # of surgeries: 0  Review of Systems    Patient reports that her overall health is better compared to last year.  History obtained from chart review and the patient Musculoskeletal ROS: positive for - pain in back - both sides    Patient Reported Readings (BP, Pulse, CBG, Weight, etc) none  Pain Assessment Pain : 0-10 Pain Score: 7  Pain Type: Chronic pain Pain Location: Hip Pain Orientation: Lower, Left, Right Pain Descriptors / Indicators: Constant, Penetrating Pain Onset: Other  (comment)(8-9 years ago) Pain Frequency: Constant Pain Relieving Factors: Pain medication - Hydrocodone 7.5/325 Effect of Pain on Daily Activities: Effects quality of life  Pain Relieving Factors: Pain medication - Hydrocodone 7.5/325  Current Medications & Allergies (verified) Allergies as of 09/04/2019   No Known Allergies     Medication List       Accurate as of September 04, 2019  8:49 AM. If you have any questions, ask your nurse or doctor.        STOP taking these medications   docusate sodium 100 MG capsule Commonly known as: COLACE   folic acid 1 MG tablet Commonly known as: FOLVITE   guaiFENesin 600 MG 12 hr tablet Commonly known as: MUCINEX   pantoprazole 40 MG tablet Commonly known as: PROTONIX   traMADol 50 MG tablet Commonly known as: ULTRAM   Trelegy Ellipta 100-62.5-25 MCG/INH Aepb Generic drug: Fluticasone-Umeclidin-Vilant     TAKE these medications   albuterol (2.5 MG/3ML) 0.083% nebulizer solution Commonly known as: PROVENTIL Take 3 mLs (2.5 mg total) by nebulization every 6 (six) hours as needed for wheezing or shortness of breath.   amitriptyline 100 MG tablet Commonly known as: ELAVIL Take 2 tablets (200 mg total) by mouth at bedtime.   CVS D3 25 MCG (1000 UT) capsule Generic drug: Cholecalciferol TAKE 1 CAPSULE (1,000 UNITS TOTAL) BY MOUTH DAILY.   furosemide 40 MG tablet Commonly known as: LASIX TAKE 1 TABLET BY MOUTH TWICE A DAY   HYDROcodone-acetaminophen 7.5-325 MG tablet Commonly known as: NORCO Take 1 tablet by mouth every 6 (six) hours as needed for moderate pain.   ibuprofen 800 MG tablet Commonly known as: ADVIL Take 800 mg  by mouth 3 (three) times daily as needed.   Klor-Con M20 20 MEQ tablet Generic drug: potassium chloride SA TAKE 2 TABLETS (40 MEQ TOTAL) BY MOUTH 3 (THREE) TIMES DAILY. FOR POTASSIUM REPLACEMENT/ SUPPLEMENT   LORazepam 0.5 MG tablet Commonly known as: ATIVAN Take 1 tablet (0.5 mg total) by mouth 2 (two)  times daily as needed for anxiety.   ziprasidone 20 MG capsule Commonly known as: Geodon Take 1 capsule (20 mg total) by mouth 2 (two) times daily with a meal.       History (reviewed): Past Medical History:  Diagnosis Date  . Asthma   . Bipolar 1 disorder ([place])   . COPD (chronic obstructive pulmonary disease) ([place])   . Emphysema lung ([place])   . Emphysema of lung ([place])   . Neuropathy   . Scoliosis    Past Surgical History:  Procedure Laterality Date  . ABDOMINAL HYSTERECTOMY    . BACK SURGERY    . BREAST REDUCTION SURGERY    . GALLBLADDER SURGERY    . GASTRIC BYPASS    . tummy tuck     Family History  Problem Relation Age of Onset  . Cancer Mother        bone  . Cancer Father        liver, lungs  . Bipolar disorder Daughter   . Alzheimer's disease Maternal Grandmother   . Tuberculosis Maternal Grandfather   . Cancer Paternal Grandmother   . Stroke Paternal Grandfather    Social History   Socioeconomic History  . Marital status: Widowed    Spouse name: Not on file  . Number of children: Not on file  . Years of education: Not on file  . Highest education level: Not on file  Occupational History  . Not on file  Tobacco Use  . Smoking status: Former Smoker    Packs/day: 2.00    Years: 35.00    Pack years: 70.00    Types: Cigarettes    Quit date: 07/05/2016    Years since quitting: 3.1  . Smokeless tobacco: Never Used  Substance and Sexual Activity  . Alcohol use: Yes    Comment: occasional  . Drug use: Never  . Sexual activity: Not Currently  Other Topics Concern  . Not on file  Social History Narrative  . Not on file   Social Determinants of Health   Financial Resource Strain:   . Difficulty of Paying Living Expenses: Not on file  Food Insecurity:   . Worried About Charity fundraiser in the Last Year: Not on file  . Ran Out of Food in the Last Year: Not on file  Transportation Needs:   . Lack of Transportation (Medical): Not on file  . Lack of  Transportation (Non-Medical): Not on file  Physical Activity:   . Days of Exercise per Week: Not on file  . Minutes of Exercise per Session: Not on file  Stress:   . Feeling of Stress : Not on file  Social Connections:   . Frequency of Communication with Friends and Family: Not on file  . Frequency of Social Gatherings with Friends and Family: Not on file  . Attends Religious Services: Not on file  . Active Member of Clubs or Organizations: Not on file  . Attends Archivist Meetings: Not on file  . Marital Status: Not on file    Activities of Daily Living In your present state of health, do you have any difficulty performing the following  activities: 09/04/2019  Hearing? N  Vision? 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 N  Walking or climbing stairs? Y  Comment walks with a cane  Dressing or bathing? N  Doing errands, shopping? N  Preparing Food and eating ? N  Using the Toilet? N  In the past six months, have you accidently leaked urine? N  Do you have problems with loss of bowel control? N  Managing your Medications? N  Managing your Finances? N  Housekeeping or managing your Housekeeping? N  Some recent data might be hidden    Patient Education/ Literacy How often do you need to have someone help you when you read instructions, pamphlets, or other written materials from your doctor or pharmacy?: 1 - Never What is the last grade level you completed in school?: 11th  Exercise Current Exercise Habits: Home exercise routine, Type of exercise: walking, Time (Minutes): 30, Frequency (Times/Week): 5, Weekly Exercise (Minutes/Week): 150, Intensity: Mild, Exercise limited by: orthopedic condition(s)  Diet Patient reports consuming 2 meals a day and 2 snack(s) a day Patient reports that her primary diet is: Regular Patient reports that she does have regular access to food.   Depression Screen PHQ 2/9 Scores 09/04/2019  05/10/2018 03/09/2018  PHQ - 2 Score 0 0 0     Fall Risk Fall Risk  09/04/2019 05/10/2018 03/09/2018  Falls in the past year? 0 0 Yes  Number falls in past yr: - 0 2 or more  Injury with Fall? - - No     Objective:  Brittany Conrad seemed alert and oriented and she participated appropriately during our telephone visit.  Blood Pressure Weight BMI  BP Readings from Last 3 Encounters:  05/10/18 123/81  03/31/18 137/87  03/09/18 133/86   Wt Readings from Last 3 Encounters:  05/10/18 164 lb 3.2 oz (74.5 kg)  03/09/18 164 lb (74.4 kg)   BMI Readings from Last 1 Encounters:  05/10/18 30.03 kg/m    *Unable to obtain current vital signs, weight, and BMI due to telephone visit type  Hearing/Vision  . Genia did  seem to have difficulty with hearing/understanding during the telephone conversation . Reports that she has had a formal eye exam by an eye care professional within the past year . Reports that she has had a formal hearing evaluation within the past year *Unable to fully assess hearing and vision during telephone visit type  Cognitive Function: 6CIT Screen 09/04/2019  What Year? 0 points  What month? 0 points  What time? 0 points  Count back from 20 2 points  Months in reverse 0 points  Repeat phrase 2 points  Total Score 4   (Normal:0-7, Significant for Dysfunction: >8)  Normal Cognitive Function Screening: Yes   Immunization & Health Maintenance Record Immunization History  Administered Date(s) Administered  . Influenza, High Dose Seasonal PF 03/31/2018  . Tdap 01/11/2019    Health Maintenance  Topic Date Due  . Hepatitis C Screening  08-24-1950  . MAMMOGRAM  01/14/2002  . COLONOSCOPY  01/14/2002  . DEXA SCAN  01/14/2017  . PNA vac Low Risk Adult (1 of 2 - PCV13) 01/14/2017  . INFLUENZA VACCINE  02/03/2019  . TETANUS/TDAP  01/10/2029       Assessment  This is a routine wellness examination for [facility].  Health Maintenance: Due or Overdue Health  Maintenance Due  Topic Date Due  . Hepatitis C Screening  03-26-1951  . MAMMOGRAM  01/14/2002  .  COLONOSCOPY  01/14/2002  . DEXA SCAN  01/14/2017  . PNA vac Low Risk Adult (1 of 2 - PCV13) 01/14/2017  . INFLUENZA VACCINE  02/03/2019    Brittany Conrad does not need a referral for Community Assistance: Care Management:   no Social Work:    no Prescription Assistance:  no Nutrition/Diabetes Education:  no   Plan:  Personalized Goals Goals Addressed   None    Personalized Health Maintenance & Screening Recommendations  Pneumococcal vaccine   Lung Cancer Screening Recommended: no (Low Dose CT Chest recommended if Age 49-80 years, 30 pack-year currently smoking OR have quit w/in past 15 years) Hepatitis C Screening recommended: no HIV Screening recommended: no  Advanced Directives: Written information was not prepared per patient's request.  Referrals & Orders No orders of the defined types were placed in this encounter.   Follow-up Plan . Follow-up with Mechele Claude, MD as planned . Schedule Dexa scan . Schedule Colonoscopy . Schedule Mammogram    I have personally reviewed and noted the following in the patient's chart:   . Medical and social history . Use of alcohol, tobacco or illicit drugs  . Current medications and supplements . Functional ability and status . Nutritional status . Physical activity . Advanced directives . List of other physicians . Hospitalizations, surgeries, and ER visits in previous 12 months . Vitals . Screenings to include cognitive, depression, and falls . Referrals and appointments  In addition, I have reviewed and discussed with Brittany Conrad certain preventive protocols, quality metrics, and best practice recommendations. A written personalized care plan for preventive services as well as general preventive health recommendations is available and can be mailed to the patient at her request.      Mariam Dollar,  LPN  01/07/2830

## 2019-09-05 ENCOUNTER — Other Ambulatory Visit: Payer: Self-pay | Admitting: Nurse Practitioner

## 2019-09-05 NOTE — Telephone Encounter (Signed)
Last office visit 08/16/2019 Last refill 04/06/2019, #180, one refill

## 2019-09-07 ENCOUNTER — Encounter (HOSPITAL_COMMUNITY): Payer: Self-pay | Admitting: Psychiatry

## 2019-09-07 ENCOUNTER — Ambulatory Visit (INDEPENDENT_AMBULATORY_CARE_PROVIDER_SITE_OTHER): Payer: Medicare HMO | Admitting: Psychiatry

## 2019-09-07 ENCOUNTER — Other Ambulatory Visit: Payer: Self-pay

## 2019-09-07 DIAGNOSIS — F330 Major depressive disorder, recurrent, mild: Secondary | ICD-10-CM | POA: Insufficient documentation

## 2019-09-07 MED ORDER — BUSPIRONE HCL 5 MG PO TABS: 5.0000 mg | ORAL_TABLET | Freq: Three times a day (TID) | ORAL | 1 refills | Status: DC

## 2019-09-07 NOTE — Patient Instructions (Signed)
1. Continue amitriptyline 200 mg at night  2. Start buspar 5 mg three times a day  3. Discontinue Geodon 4. Next appointment: 4/14 at 8:40

## 2019-09-10 ENCOUNTER — Ambulatory Visit (HOSPITAL_COMMUNITY): Payer: Medicare HMO | Admitting: Psychiatry

## 2019-09-10 ENCOUNTER — Other Ambulatory Visit: Payer: Self-pay

## 2019-09-10 ENCOUNTER — Other Ambulatory Visit: Payer: Self-pay | Admitting: Family Medicine

## 2019-09-13 ENCOUNTER — Other Ambulatory Visit: Payer: Self-pay | Admitting: Family Medicine

## 2019-09-13 DIAGNOSIS — F411 Generalized anxiety disorder: Secondary | ICD-10-CM

## 2019-09-13 NOTE — Telephone Encounter (Signed)
Last office visit 08/16/2019 Last refill 08/16/2019, #60, no refills

## 2019-09-18 ENCOUNTER — Other Ambulatory Visit (HOSPITAL_COMMUNITY): Payer: Self-pay | Admitting: Psychiatry

## 2019-09-18 ENCOUNTER — Telehealth (HOSPITAL_COMMUNITY): Payer: Self-pay | Admitting: *Deleted

## 2019-09-18 MED ORDER — HYDROXYZINE PAMOATE 25 MG PO CAPS: 25.0000 mg | ORAL_CAPSULE | Freq: Every evening | ORAL | 0 refills | Status: DC | PRN

## 2019-09-18 NOTE — Telephone Encounter (Signed)
Discussed with the patient. Will try hydroxyzine 25 mg at night as needed for sleep.

## 2019-09-18 NOTE — Telephone Encounter (Signed)
ARE YOU ABLE TO CALL & DISCUSS WITH PATIENT?

## 2019-09-18 NOTE — Telephone Encounter (Signed)
Patient called stating that she still isn't sleeping & asked if  amitriptyline (ELAVIL) 100 MG tablet Could be increase to 50 mg more?

## 2019-09-18 NOTE — Telephone Encounter (Signed)
I would not recommend it.

## 2019-09-28 ENCOUNTER — Ambulatory Visit (INDEPENDENT_AMBULATORY_CARE_PROVIDER_SITE_OTHER): Payer: Medicare HMO | Admitting: Family Medicine

## 2019-09-28 ENCOUNTER — Encounter: Payer: Self-pay | Admitting: Family Medicine

## 2019-09-28 ENCOUNTER — Other Ambulatory Visit: Payer: Self-pay

## 2019-09-28 VITALS — BP 145/86 | HR 105 | Temp 100.6°F | Ht 62.0 in | Wt 173.6 lb

## 2019-09-28 DIAGNOSIS — H6123 Impacted cerumen, bilateral: Secondary | ICD-10-CM

## 2019-09-28 NOTE — Progress Notes (Signed)
Subjective:  Patient ID: Brittany Conrad, female    DOB: 1950-12-21  Age: 69 y.o. MRN: [id number]  CC: decreased hearing, wax build up in ears/   HPI Brittany Conrad presents for acutely decreased hearing.  She went to get hearing aids and was told she needs to have her doctor wash her ears out before they can fit them.  Depression screen [facility] 2/9 09/28/2019 09/04/2019 05/10/2018  Decreased Interest 0 0 0  Down, Depressed, Hopeless 0 0 0  PHQ - 2 Score 0 0 0    History Brittany Conrad has a past medical history of Asthma, Bipolar 1 disorder ([place]), COPD (chronic obstructive pulmonary disease) ([place]), Emphysema lung ([place]), Emphysema of lung ([place]), Neuropathy, and Scoliosis.   She has a past surgical history that includes Abdominal hysterectomy; Gallbladder surgery; Breast reduction surgery; tummy tuck; Back surgery; and Gastric bypass.   Her family history includes Alzheimer's disease in her maternal grandmother; Bipolar disorder in her daughter; Cancer in her father, mother, and paternal grandmother; Stroke in her paternal grandfather; Tuberculosis in her maternal grandfather.She reports that she quit smoking about 3 years ago. Her smoking use included cigarettes. She has a 70.00 pack-year smoking history. She has never used smokeless tobacco. She reports current alcohol use. She reports that she does not use drugs.    ROS Review of Systems  Constitutional: Negative for activity change, fatigue and fever.  HENT: Positive for ear pain and hearing loss.     Objective:  BP (!) 145/86   Pulse (!) 105   Temp (!) 100.6 F (38.1 C) (Temporal)   Ht 5\' 2"  (1.575 m)   Wt 173 lb 9.6 oz (78.7 kg)   SpO2 96%   BMI 31.75 kg/m   BP Readings from Last 3 Encounters:  09/28/19 (!) 145/86  05/10/18 123/81  03/31/18 137/87    Wt Readings from Last 3 Encounters:  09/28/19 173 lb 9.6 oz (78.7 kg)  05/10/18 164 lb 3.2 oz (74.5 kg)  03/09/18 164 lb (74.4 kg)     Physical Exam Constitutional:       General: She is not in acute distress.    Appearance: She is well-developed.  HENT:     Ears:     Comments: Bilateral cerumen impaction.  Lavage of the left with successful leaving a normal exam.  The right TM was lavaged 3 times without resolution.  Therefore patient is to continue treatment at home with Debrox drops and follow-up in about 10 days for relavage. Cardiovascular:     Rate and Rhythm: Normal rate and regular rhythm.  Pulmonary:     Breath sounds: Normal breath sounds.  Skin:    General: Skin is warm and dry.  Neurological:     Mental Status: She is alert and oriented to person, place, and time.       Assessment & Plan:   Brittany Conrad was seen today for decreased hearing, wax build up in ears/.  Diagnoses and all orders for this visit:  Bilateral impacted cerumen       I am having Brittany Conrad maintain her albuterol, ibuprofen, Klor-Con M20, CVS D3, HYDROcodone-acetaminophen, amitriptyline, busPIRone, furosemide, LORazepam, and hydrOXYzine.  Allergies as of 09/28/2019   No Known Allergies     Medication List       Accurate as of September 28, 2019 11:59 PM. If you have any questions, ask your nurse or doctor.        albuterol (2.5 MG/3ML) 0.083% nebulizer solution Commonly known as: PROVENTIL Take  3 mLs (2.5 mg total) by nebulization every 6 (six) hours as needed for wheezing or shortness of breath.   amitriptyline 100 MG tablet Commonly known as: ELAVIL TAKE 2 TABLETS (200 MG TOTAL) BY MOUTH AT BEDTIME.   busPIRone 5 MG tablet Commonly known as: BUSPAR Take 1 tablet (5 mg total) by mouth 3 (three) times daily.   CVS D3 25 MCG (1000 UT) capsule Generic drug: Cholecalciferol TAKE 1 CAPSULE (1,000 UNITS TOTAL) BY MOUTH DAILY.   furosemide 40 MG tablet Commonly known as: LASIX TAKE 1 TABLET BY MOUTH TWICE A DAY   HYDROcodone-acetaminophen 7.5-325 MG tablet Commonly known as: NORCO Take 1 tablet by mouth every 6 (six) hours as needed for moderate  pain.   hydrOXYzine 25 MG capsule Commonly known as: VISTARIL Take 1 capsule (25 mg total) by mouth at bedtime as needed (insomnia).   ibuprofen 800 MG tablet Commonly known as: ADVIL Take 800 mg by mouth 3 (three) times daily as needed.   Klor-Con M20 20 MEQ tablet Generic drug: potassium chloride SA TAKE 2 TABLETS (40 MEQ TOTAL) BY MOUTH 3 (THREE) TIMES DAILY. FOR POTASSIUM REPLACEMENT/ SUPPLEMENT   LORazepam 0.5 MG tablet Commonly known as: ATIVAN TAKE 1 TABLET (0.5 MG TOTAL) BY MOUTH 2 (TWO) TIMES DAILY AS NEEDED FOR ANXIETY.      [facility] with debrox instructions printed and reviewed.  Follow-up: Return in about 10 days (around 10/08/2019), or if symptoms worsen or fail to improve.  Mechele Claude, M.D.

## 2019-09-28 NOTE — Patient Instructions (Addendum)
Earwax removal:  Debrox drops are available without a prescription at your pharmacy.  Lay on your side with the ear up that you want to treat. Place for 5 drops of the Debrox in the ear canal and lay still for 15 minutes. After that time you can sit up and allow the excess to run out of the ear. Repeat this with the other ear if needed.  Repeat this process daily for 1 week. By that time the ear should feel less clogged and her hearing should be better, if not, follow up in the office for recheck of the ear.  Thanks, [employer]

## 2019-09-30 ENCOUNTER — Encounter: Payer: Self-pay | Admitting: Family Medicine

## 2019-10-02 ENCOUNTER — Telehealth (HOSPITAL_COMMUNITY): Payer: Self-pay | Admitting: *Deleted

## 2019-10-02 NOTE — Telephone Encounter (Signed)
Per provider: verify it with pharmacy refills ordered in  March 3/5/201.  Verified  Buspirone HCL 5 mg tablet on file/hold

## 2019-10-02 NOTE — Telephone Encounter (Signed)
Refill request sent via fax busPIRone (BUSPAR) 5 MG tablet

## 2019-10-02 NOTE — Telephone Encounter (Signed)
Ordered in March with refills. Please verify it with pharmacy

## 2019-10-08 ENCOUNTER — Telehealth (HOSPITAL_COMMUNITY): Payer: Self-pay | Admitting: Psychiatry

## 2019-10-08 NOTE — Telephone Encounter (Signed)
SPOKE WITH PATIENT & ASKED HOW SHE TAKING HER HYDROXYZINE? PATIENT INFORMED THAT SHE : Take 1 capsule (25 mg total) by mouth at bedtime as needed (insomnia).

## 2019-10-08 NOTE — Telephone Encounter (Signed)
Received a refill request for hydroxyzine. Per pharmacy, the patient has been taking 50 mg per day (the direction was 25 mg daily). Could you ask her what is the dose she is taking? Please also advise the patient to discuss with [place] first before self adjusting the dose (if she takes 50 mg daily).

## 2019-10-10 ENCOUNTER — Other Ambulatory Visit (HOSPITAL_COMMUNITY): Payer: Self-pay | Admitting: Psychiatry

## 2019-10-10 MED ORDER — HYDROXYZINE PAMOATE 25 MG PO CAPS: 25.0000 mg | ORAL_CAPSULE | Freq: Every evening | ORAL | 0 refills | Status: DC | PRN
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
## 2019-10-17 ENCOUNTER — Ambulatory Visit (INDEPENDENT_AMBULATORY_CARE_PROVIDER_SITE_OTHER): Payer: Medicare HMO | Admitting: Psychiatry

## 2019-10-17 ENCOUNTER — Other Ambulatory Visit: Payer: Self-pay

## 2019-10-17 ENCOUNTER — Encounter (HOSPITAL_COMMUNITY): Payer: Self-pay | Admitting: Psychiatry

## 2019-10-17 DIAGNOSIS — F3341 Major depressive disorder, recurrent, in partial remission: Secondary | ICD-10-CM | POA: Diagnosis not present

## 2019-10-17 MED ORDER — BUSPIRONE HCL 5 MG PO TABS: 5.0000 mg | ORAL_TABLET | Freq: Three times a day (TID) | ORAL | 0 refills | Status: DC

## 2019-10-17 NOTE — Patient Instructions (Signed)
1.Continue amitriptyline 200 mg at night 2. Continue buspar 5 mg three times a day  3. Continue hydroxyzine 25 mg daily as needed for anxiety 4. Next appointment: 7/12 at 1:20

## 2019-10-22 DIAGNOSIS — Z79899 Other long term (current) drug therapy: Secondary | ICD-10-CM | POA: Diagnosis not present

## 2019-10-22 DIAGNOSIS — G609 Hereditary and idiopathic neuropathy, unspecified: Secondary | ICD-10-CM | POA: Diagnosis not present

## 2019-10-22 DIAGNOSIS — E669 Obesity, unspecified: Secondary | ICD-10-CM | POA: Diagnosis not present

## 2019-10-22 DIAGNOSIS — F319 Bipolar disorder, unspecified: Secondary | ICD-10-CM | POA: Diagnosis not present

## 2019-11-22 DIAGNOSIS — E669 Obesity, unspecified: Secondary | ICD-10-CM | POA: Diagnosis not present

## 2019-11-22 DIAGNOSIS — F319 Bipolar disorder, unspecified: Secondary | ICD-10-CM | POA: Diagnosis not present

## 2019-11-22 DIAGNOSIS — Z79899 Other long term (current) drug therapy: Secondary | ICD-10-CM | POA: Diagnosis not present

## 2019-11-22 DIAGNOSIS — G609 Hereditary and idiopathic neuropathy, unspecified: Secondary | ICD-10-CM | POA: Diagnosis not present

## 2019-12-02 ENCOUNTER — Other Ambulatory Visit: Payer: Self-pay | Admitting: Family Medicine

## 2019-12-05 ENCOUNTER — Ambulatory Visit (HOSPITAL_COMMUNITY)
Admission: RE | Admit: 2019-12-05 | Discharge: 2019-12-05 | Disposition: A | Discharge status: Home / Self Care | Payer: Medicare HMO | Source: Ambulatory Visit | Attending: Family Medicine | Admitting: Family Medicine

## 2019-12-05 ENCOUNTER — Encounter: Payer: Self-pay | Admitting: Family Medicine

## 2019-12-05 ENCOUNTER — Ambulatory Visit (INDEPENDENT_AMBULATORY_CARE_PROVIDER_SITE_OTHER): Payer: Medicare HMO | Admitting: Family Medicine

## 2019-12-05 ENCOUNTER — Other Ambulatory Visit: Payer: Self-pay | Admitting: Family Medicine

## 2019-12-05 ENCOUNTER — Other Ambulatory Visit: Payer: Self-pay

## 2019-12-05 VITALS — BP 155/95 | HR 95 | Temp 97.2°F | Ht 62.0 in | Wt 180.0 lb

## 2019-12-05 DIAGNOSIS — R112 Nausea with vomiting, unspecified: Secondary | ICD-10-CM

## 2019-12-05 DIAGNOSIS — R1013 Epigastric pain: Secondary | ICD-10-CM | POA: Insufficient documentation

## 2019-12-05 DIAGNOSIS — R1011 Right upper quadrant pain: Secondary | ICD-10-CM

## 2019-12-05 NOTE — Progress Notes (Signed)
Assessment & Plan:  1-3. Epigastric pain/RUQ pain/Nausea and vomiting - US Abdomen Limited; Future (STAT)   Follow up plan: Return if symptoms worsen or fail to improve.  Hendricks Limes, MSN, APRN, FNP-C [facility]  Subjective:   Patient ID: Brittany Conrad, female    DOB: 1950/12/29, 69 y.o.   MRN: [id number]  HPI: Brittany Conrad is a 69 y.o. female presenting on 12/05/2019 for Back Pain (x 1 week.  Patient states that the pain is in the middle of her back and she feels like something is stuck.) and Abdominal Pain  Patient c/o pain between her shoulder blades (more on the left side), back pain, and epigastric pain. She feels like she swallowed an apple that is stuck in her epigastric region. She has been unable to eat without throwing up yesterday and today. The stomach issues just started last week. The back pain has been ongoing for the past couple of weeks. The last time she ate was last night (butter toast).    ROS: Negative unless specifically indicated above in HPI.   Relevant past medical history reviewed and updated as indicated.   Allergies and medications reviewed and updated.   Current Outpatient Medications:  .  albuterol (PROVENTIL) (2.5 MG/3ML) 0.083% nebulizer solution, Take 3 mLs (2.5 mg total) by nebulization every 6 (six) hours as needed for wheezing or shortness of breath., Disp: 150 mL, Rfl: 1 .  amitriptyline (ELAVIL) 100 MG tablet, TAKE 2 TABLETS (200 MG TOTAL) BY MOUTH AT BEDTIME., Disp: 180 tablet, Rfl: 1 .  busPIRone (BUSPAR) 5 MG tablet, Take 1 tablet (5 mg total) by mouth 3 (three) times daily., Disp: 270 tablet, Rfl: 0 .  CVS D3 25 MCG (1000 UT) capsule, TAKE 1 CAPSULE (1,000 UNITS TOTAL) BY MOUTH DAILY., Disp: 120 capsule, Rfl: 11 .  furosemide (LASIX) 40 MG tablet, TAKE 1 TABLET BY MOUTH TWICE A DAY, Disp: 180 tablet, Rfl: 0 .  gabapentin (NEURONTIN) 300 MG capsule, Take 300 mg by mouth 2 (two) times daily., Disp: , Rfl:  .   HYDROcodone-acetaminophen (NORCO) 7.5-325 MG tablet, Take 1 tablet by mouth every 6 (six) hours as needed for moderate pain., Disp: , Rfl:  .  hydrOXYzine (VISTARIL) 25 MG capsule, Take 1 capsule (25 mg total) by mouth at bedtime as needed (insomnia)., Disp: 90 capsule, Rfl: 0 .  ibuprofen (ADVIL,MOTRIN) 800 MG tablet, Take 800 mg by mouth 3 (three) times daily as needed., Disp: , Rfl: 0 .  KLOR-CON M20 20 MEQ tablet, TAKE 2 TABLETS (40 MEQ TOTAL) BY MOUTH 3 (THREE) TIMES DAILY. FOR POTASSIUM REPLACEMENT/ SUPPLEMENT, Disp: 540 tablet, Rfl: 1 .  LORazepam (ATIVAN) 0.5 MG tablet, TAKE 1 TABLET (0.5 MG TOTAL) BY MOUTH 2 (TWO) TIMES DAILY AS NEEDED FOR ANXIETY., Disp: 60 tablet, Rfl: 2 .  pantoprazole (PROTONIX) 40 MG tablet, Take 40 mg by mouth 2 (two) times daily., Disp: , Rfl:   No Known Allergies  Objective:   BP (!) 155/95   Pulse 95   Temp (!) 97.2 F (36.2 C) (Temporal)   Ht 5\' 2"  (1.575 m)   Wt 180 lb (81.6 kg)   SpO2 100%   BMI 32.92 kg/m    Physical Exam Vitals reviewed.  Constitutional:      General: She is not in acute distress.    Appearance: Normal appearance. She is obese. She is not ill-appearing, toxic-appearing or diaphoretic.  HENT:     Head: Normocephalic and atraumatic.  Eyes:  General: No scleral icterus.       Right eye: No discharge.        Left eye: No discharge.     Conjunctiva/sclera: Conjunctivae normal.  Cardiovascular:     Rate and Rhythm: Normal rate.  Pulmonary:     Effort: Pulmonary effort is normal. No respiratory distress.  Abdominal:     General: Bowel sounds are normal.     Palpations: Abdomen is soft. There is no hepatomegaly, splenomegaly, mass or pulsatile mass.     Tenderness: There is abdominal tenderness in the right upper quadrant and epigastric area.  Musculoskeletal:        General: Normal range of motion.     Cervical back: Normal range of motion.  Skin:    General: Skin is warm and dry.     Capillary Refill: Capillary refill  takes less than 2 seconds.  Neurological:     General: No focal deficit present.     Mental Status: She is alert and oriented to person, place, and time. Mental status is at baseline.  Psychiatric:        Mood and Affect: Mood normal.        Behavior: Behavior normal.        Thought Content: Thought content normal.        Judgment: Judgment normal.

## 2019-12-10 ENCOUNTER — Encounter: Payer: Self-pay | Admitting: Internal Medicine

## 2019-12-11 ENCOUNTER — Other Ambulatory Visit: Payer: Self-pay | Admitting: Family Medicine

## 2019-12-13 ENCOUNTER — Other Ambulatory Visit: Payer: Self-pay | Admitting: Family Medicine

## 2019-12-13 DIAGNOSIS — F411 Generalized anxiety disorder: Secondary | ICD-10-CM

## 2019-12-19 ENCOUNTER — Encounter: Payer: Self-pay | Admitting: Physician Assistant

## 2019-12-19 ENCOUNTER — Ambulatory Visit: Payer: Medicare HMO | Admitting: Family Medicine

## 2019-12-19 ENCOUNTER — Ambulatory Visit (INDEPENDENT_AMBULATORY_CARE_PROVIDER_SITE_OTHER): Payer: Medicare HMO | Admitting: Physician Assistant

## 2019-12-19 ENCOUNTER — Other Ambulatory Visit: Payer: Self-pay

## 2019-12-19 VITALS — BP 134/81 | HR 102 | Temp 97.9°F | Resp 20 | Ht 62.0 in | Wt 179.0 lb

## 2019-12-19 DIAGNOSIS — F411 Generalized anxiety disorder: Secondary | ICD-10-CM

## 2019-12-19 NOTE — Progress Notes (Signed)
°  Subjective:     Patient ID: Brittany Conrad, female   DOB: 1951-05-16, 69 y.o.   MRN: [id number]  HPI Pt seen for possible refill of medications Per pt currently taking Ativan bid -tid prn anxiety She is currently under the care of Psych for Bipolar disorder States she is out of her Ativan since Thurs  Was supposed to see Dr Darlyn Read today but appt changed due to his illness   Review of Systems  Psychiatric/Behavioral: Positive for agitation. Negative for confusion. The patient is hyperactive.        Objective:   Physical Exam Vitals and nursing note reviewed.   Pt angry in the room Questioning why she had to come in for refill    Assessment:     1. GAD (generalized anxiety disorder)        Plan:     Reviewed with the pt that her last note stated that meds were to be taken care of through her Psych When I asked her about this she states she asked but her Psych refuses to write this med Also asked why has she run out and pt states she did not know she would have to be seen before getting a refill Informed I am uncomfortable writing med at this time due to prev note Recommended message/appt with Dr Darlyn Read Pt became upset and left

## 2019-12-20 ENCOUNTER — Telehealth (HOSPITAL_COMMUNITY): Payer: Self-pay | Admitting: *Deleted

## 2019-12-20 NOTE — Telephone Encounter (Signed)
I will not plan to prescribe lorazepam as discussed before with her.

## 2019-12-20 NOTE — Telephone Encounter (Signed)
Patient called and [facility] stating that although Dr. Vanetta Shawl do not prescribe her the Lorazepam, she was wondering if provider could refill it for her. Per pt, her Primary Care doctor wants patient Psychiatrist to start refilling this medication and they will no longer refill it for her. So patient so she is asking to see if Dr. Vanetta Shawl could refill her Lorazepam for her. Per pt she has been out for a week and per pt she's "going sorta crazy right now". Patient number is [phone].

## 2019-12-20 NOTE — Telephone Encounter (Signed)
LMOM

## 2019-12-20 NOTE — Telephone Encounter (Signed)
Informed patient with what provider stated and she verbalized understanding.  

## 2019-12-21 DIAGNOSIS — G609 Hereditary and idiopathic neuropathy, unspecified: Secondary | ICD-10-CM | POA: Diagnosis not present

## 2019-12-21 DIAGNOSIS — E669 Obesity, unspecified: Secondary | ICD-10-CM | POA: Diagnosis not present

## 2019-12-21 DIAGNOSIS — Z79899 Other long term (current) drug therapy: Secondary | ICD-10-CM | POA: Diagnosis not present

## 2019-12-21 DIAGNOSIS — F319 Bipolar disorder, unspecified: Secondary | ICD-10-CM | POA: Diagnosis not present

## 2019-12-26 ENCOUNTER — Encounter: Payer: Self-pay | Admitting: Family Medicine

## 2019-12-26 ENCOUNTER — Other Ambulatory Visit: Payer: Self-pay

## 2019-12-26 ENCOUNTER — Ambulatory Visit (INDEPENDENT_AMBULATORY_CARE_PROVIDER_SITE_OTHER): Payer: Medicare HMO | Admitting: Family Medicine

## 2019-12-26 VITALS — BP 156/98 | HR 108 | Temp 97.0°F | Resp 20 | Ht 62.0 in | Wt 181.0 lb

## 2019-12-26 DIAGNOSIS — F411 Generalized anxiety disorder: Secondary | ICD-10-CM | POA: Diagnosis not present

## 2019-12-26 DIAGNOSIS — J449 Chronic obstructive pulmonary disease, unspecified: Secondary | ICD-10-CM | POA: Diagnosis not present

## 2019-12-26 DIAGNOSIS — F330 Major depressive disorder, recurrent, mild: Secondary | ICD-10-CM | POA: Diagnosis not present

## 2019-12-26 NOTE — Progress Notes (Signed)
Subjective:  Patient ID: Ronnette Juniper, female    DOB: 1950/11/23  Age: 69 y.o. MRN: [id number]  CC: Medication Refill   HPI Nayana Lenig presents for follow-up on her anxiety and chronic pain.  She was told no with regard to benzodiazepine when she was here a week ago to see one of my colleagues.  She returns today for follow-up with me as her PCP.  Of note is that she was referred to psychiatry because of this issue.  Her psychiatrist declined to renew the benzodiazepines.  She offered other medications in its place.  Patient tells me now that she does not want to return.  Also of note is that the patient takes chronic opiates for her pain.  She says she has been taking these for many many years without any problem with overdose.  Records from her psychiatrist, Dr. Neysa Hotter were reviewed.  And then there is a specific note declining to renew Ms. Sanabia benzodiazepine.  History Madine has a past medical history of Asthma, Bipolar 1 disorder (HCC), COPD (chronic obstructive pulmonary disease) (HCC), Emphysema lung (HCC), Emphysema of lung (HCC), Neuropathy, and Scoliosis.   She has a past surgical history that includes Abdominal hysterectomy; Gallbladder surgery; Breast reduction surgery; tummy tuck; Back surgery; and Gastric bypass.   Her family history includes Alzheimer's disease in her maternal grandmother; Bipolar disorder in her daughter; Cancer in her father, mother, and paternal grandmother; Stroke in her paternal grandfather; Tuberculosis in her maternal grandfather.She reports that she quit smoking about 3 years ago. Her smoking use included cigarettes. She has a 70.00 pack-year smoking history. She has never used smokeless tobacco. She reports current alcohol use. She reports that she does not use drugs.    ROS Review of Systems  Patient was quite distraught, she became angry during the visit and review of systems could not be performed.  Objective:  BP (!) 156/98    Pulse (!) 108   Temp (!) 97 F (36.1 C) (Temporal)   Resp 20   Ht 5\' 2"  (1.575 m)   Wt 181 lb (82.1 kg)   SpO2 98%   BMI 33.11 kg/m   BP Readings from Last 3 Encounters:  12/26/19 (!) 156/98  12/19/19 134/81  12/05/19 (!) 155/95    Wt Readings from Last 3 Encounters:  12/26/19 181 lb (82.1 kg)  12/19/19 179 lb (81.2 kg)  12/05/19 180 lb (81.6 kg)     Physical Exam Constitutional:      General: She is in acute distress.  Psychiatric:        Mood and Affect: Mood is anxious. Affect is labile and angry.        Speech: Speech is rapid and pressured.        Behavior: Behavior is uncooperative and agitated.        Judgment: Judgment is impulsive.       Assessment & Plan:   There are no diagnoses linked to this encounter.     I am having 02/04/20 maintain her albuterol, ibuprofen, CVS D3, HYDROcodone-acetaminophen, amitriptyline, LORazepam, hydrOXYzine, gabapentin, busPIRone, furosemide, pantoprazole, and Klor-Con M20.  Allergies as of 12/26/2019   No Known Allergies     Medication List       Accurate as of December 26, 2019  1:54 PM. If you have any questions, ask your nurse or doctor.        albuterol (2.5 MG/3ML) 0.083% nebulizer solution Commonly known as: PROVENTIL Take 3 mLs (2.5 mg total)  by nebulization every 6 (six) hours as needed for wheezing or shortness of breath.   amitriptyline 100 MG tablet Commonly known as: ELAVIL TAKE 2 TABLETS (200 MG TOTAL) BY MOUTH AT BEDTIME.   busPIRone 5 MG tablet Commonly known as: BUSPAR Take 1 tablet (5 mg total) by mouth 3 (three) times daily.   CVS D3 25 MCG (1000 UT) capsule Generic drug: Cholecalciferol TAKE 1 CAPSULE (1,000 UNITS TOTAL) BY MOUTH DAILY.   furosemide 40 MG tablet Commonly known as: LASIX TAKE 1 TABLET BY MOUTH TWICE A DAY   gabapentin 300 MG capsule Commonly known as: NEURONTIN Take 300 mg by mouth 3 (three) times daily.   HYDROcodone-acetaminophen 7.5-325 MG tablet Commonly  known as: NORCO Take 1 tablet by mouth every 6 (six) hours as needed for moderate pain.   hydrOXYzine 25 MG capsule Commonly known as: VISTARIL Take 1 capsule (25 mg total) by mouth at bedtime as needed (insomnia).   ibuprofen 800 MG tablet Commonly known as: ADVIL Take 800 mg by mouth 3 (three) times daily as needed.   Klor-Con M20 20 MEQ tablet Generic drug: potassium chloride SA TAKE 2 TABLETS (40 MEQ TOTAL) BY MOUTH 3 (THREE) TIMES DAILY. FOR POTASSIUM REPLACEMENT/ SUPPLEMENT   LORazepam 0.5 MG tablet Commonly known as: ATIVAN TAKE 1 TABLET (0.5 MG TOTAL) BY MOUTH 2 (TWO) TIMES DAILY AS NEEDED FOR ANXIETY.   pantoprazole 40 MG tablet Commonly known as: PROTONIX Take 40 mg by mouth 2 (two) times daily.     Ms. Bartko repeatedly stated that she had taken these medicines for a long time and never had any problem.  She admits that she does mix them with alcohol at times as well.  I tried to explain to her cordially that the change was not in her, but in our understanding of these medicines.  This dates back to the CDC report of 2016 stating that mixing benzodiazepines and opiates significantly increases her risk of overdose.  The fact that she is taking them regularly for long-term does not change her risk of overdose in the future.  Specifically as she ages that may well increase.  Unfortunately she expressed that this would not happen to her and that she would find a new primary physician who would prescribe her meds as requested.  Every attempt to explain the situation to her was made.  I even expressed my condolences for having to make the changes, but that her safety was paramount in prescribing.  Follow-up: Will be left to her discretion at this time.  [name], M.D.

## 2019-12-30 ENCOUNTER — Encounter: Payer: Self-pay | Admitting: Family Medicine

## 2020-01-03 ENCOUNTER — Other Ambulatory Visit (HOSPITAL_COMMUNITY): Payer: Self-pay | Admitting: Psychiatry

## 2020-01-03 MED ORDER — HYDROXYZINE PAMOATE 25 MG PO CAPS: 25.0000 mg | ORAL_CAPSULE | Freq: Every evening | ORAL | 0 refills | Status: DC | PRN
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
## 2020-01-14 ENCOUNTER — Ambulatory Visit (HOSPITAL_COMMUNITY): Payer: Medicare HMO | Admitting: Psychiatry

## 2020-01-21 ENCOUNTER — Ambulatory Visit: Payer: Medicare HMO | Admitting: Gastroenterology

## 2020-01-21 DIAGNOSIS — Z79899 Other long term (current) drug therapy: Secondary | ICD-10-CM | POA: Diagnosis not present

## 2020-01-21 DIAGNOSIS — F319 Bipolar disorder, unspecified: Secondary | ICD-10-CM | POA: Diagnosis not present

## 2020-01-21 DIAGNOSIS — G609 Hereditary and idiopathic neuropathy, unspecified: Secondary | ICD-10-CM | POA: Diagnosis not present
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
## 2020-01-29 ENCOUNTER — Other Ambulatory Visit: Payer: Self-pay

## 2020-01-29 ENCOUNTER — Ambulatory Visit (INDEPENDENT_AMBULATORY_CARE_PROVIDER_SITE_OTHER): Payer: Medicare HMO | Admitting: Psychiatry

## 2020-01-29 ENCOUNTER — Encounter (HOSPITAL_COMMUNITY): Payer: Self-pay | Admitting: Psychiatry

## 2020-01-29 DIAGNOSIS — F3341 Major depressive disorder, recurrent, in partial remission: Secondary | ICD-10-CM | POA: Diagnosis not present

## 2020-01-29 MED ORDER — BUSPIRONE HCL 5 MG PO TABS: 5.0000 mg | ORAL_TABLET | Freq: Three times a day (TID) | ORAL | 0 refills | Status: DC

## 2020-01-29 NOTE — Patient Instructions (Addendum)
1.Continue amitriptyline 200 mg at night 2.Continuebuspar 5 mg three times a day 3. Continue hydroxyzine 25 mg daily as needed for anxiety 4. Next appointment: as needed based on your request

## 2020-02-20 DIAGNOSIS — F319 Bipolar disorder, unspecified: Secondary | ICD-10-CM | POA: Diagnosis not present

## 2020-02-20 DIAGNOSIS — Z79899 Other long term (current) drug therapy: Secondary | ICD-10-CM | POA: Diagnosis not present

## 2020-02-20 DIAGNOSIS — G609 Hereditary and idiopathic neuropathy, unspecified: Secondary | ICD-10-CM | POA: Diagnosis not present

## 2020-02-20 DIAGNOSIS — I10 Essential (primary) hypertension: Secondary | ICD-10-CM | POA: Diagnosis not present

## 2020-02-25 ENCOUNTER — Other Ambulatory Visit: Payer: Self-pay | Admitting: Family Medicine

## 2020-02-27 ENCOUNTER — Ambulatory Visit: Payer: Medicare HMO | Admitting: Gastroenterology

## 2020-02-27 ENCOUNTER — Other Ambulatory Visit: Payer: Self-pay | Admitting: Family Medicine

## 2020-03-12 ENCOUNTER — Ambulatory Visit: Payer: Medicare HMO | Admitting: Family Medicine

## 2020-03-19 DIAGNOSIS — E669 Obesity, unspecified: Secondary | ICD-10-CM | POA: Diagnosis not present

## 2020-03-19 DIAGNOSIS — Z79899 Other long term (current) drug therapy: Secondary | ICD-10-CM | POA: Diagnosis not present

## 2020-03-19 DIAGNOSIS — G609 Hereditary and idiopathic neuropathy, unspecified: Secondary | ICD-10-CM | POA: Diagnosis not present

## 2020-03-19 DIAGNOSIS — F319 Bipolar disorder, unspecified: Secondary | ICD-10-CM | POA: Diagnosis not present

## 2020-03-25 ENCOUNTER — Ambulatory Visit: Payer: Medicare HMO | Admitting: Family Medicine

## 2020-03-25 ENCOUNTER — Other Ambulatory Visit (HOSPITAL_COMMUNITY): Payer: Self-pay | Admitting: Psychiatry

## 2020-03-25 MED ORDER — HYDROXYZINE PAMOATE 25 MG PO CAPS
25.0000 mg | ORAL_CAPSULE | Freq: Every evening | ORAL | 0 refills | Status: DC | PRN
Start: 2020-03-25 — End: 2021-04-23

## 2020-04-08 ENCOUNTER — Ambulatory Visit (INDEPENDENT_AMBULATORY_CARE_PROVIDER_SITE_OTHER): Payer: Medicare HMO | Admitting: Family Medicine

## 2020-04-08 ENCOUNTER — Encounter: Payer: Self-pay | Admitting: Family Medicine

## 2020-04-08 ENCOUNTER — Other Ambulatory Visit: Payer: Self-pay

## 2020-04-08 VITALS — BP 158/98 | HR 109 | Temp 97.7°F | Resp 20 | Ht 62.0 in | Wt 177.0 lb

## 2020-04-08 DIAGNOSIS — Z23 Encounter for immunization: Secondary | ICD-10-CM

## 2020-04-08 DIAGNOSIS — I10 Essential (primary) hypertension: Secondary | ICD-10-CM

## 2020-04-08 MED ORDER — AMLODIPINE BESYLATE 5 MG PO TABS: 5.0000 mg | ORAL_TABLET | Freq: Every day | ORAL | 5 refills | Status: DC

## 2020-04-08 NOTE — Progress Notes (Signed)
Subjective:  Patient ID: Brittany Conrad, female    DOB: Jul 05, 1951  Age: 69 y.o. MRN: [id number]  CC: Hypertension   HPI Brittany Conrad presents for follow-up of her high blood pressure.  She has had history of high blood pressure but recently has not been taking any of her medications they were discontinued.  In the past she is taking atenolol amlodipine and lisinopril.  She would like to resume those.  She denies any problems with chest pain palpitations headache or TIAs currently.  She has recently had iron deficiency anemia and would like to have her iron checked today.  Depression screen [name] 2/9 04/08/2020 12/26/2019 12/19/2019  Decreased Interest 0 1 0  Down, Depressed, Hopeless 0 0 1  PHQ - 2 Score 0 1 1  Altered sleeping - 0 -  Tired, decreased energy - 3 -  Change in appetite - 0 -  Feeling bad or failure about yourself  - 0 -  Trouble concentrating - 0 -  Moving slowly or fidgety/restless - 0 -  Suicidal thoughts - 0 -  PHQ-9 Score - 4 -  Difficult doing work/chores - Somewhat difficult -    History Maclaine has a past medical history of Asthma, Bipolar 1 disorder ([place]), COPD (chronic obstructive pulmonary disease) ([place]), Emphysema lung ([place]), Emphysema of lung ([place]), Neuropathy, and Scoliosis.   She has a past surgical history that includes Abdominal hysterectomy; Gallbladder surgery; Breast reduction surgery; tummy tuck; Back surgery; and Gastric bypass.   Her family history includes Alzheimer's disease in her maternal grandmother; Bipolar disorder in her daughter; Cancer in her father, mother, and paternal grandmother; Stroke in her paternal grandfather; Tuberculosis in her maternal grandfather.She reports that she quit smoking about 3 years ago. Her smoking use included cigarettes. She has a 70.00 pack-year smoking history. She has never used smokeless tobacco. She reports current alcohol use. She reports that she does not use drugs.    ROS Review of Systems   Constitutional: Negative.   HENT: Negative.   Eyes: Negative for visual disturbance.  Respiratory: Negative for shortness of breath.   Cardiovascular: Negative for chest pain.  Gastrointestinal: Negative for abdominal pain.  Musculoskeletal: Positive for arthralgias and back pain.    Objective:  BP (!) 158/98   Pulse (!) 109   Temp 97.7 F (36.5 C) (Temporal)   Resp 20   Ht 5' 2" (1.575 m)   Wt 177 lb (80.3 kg)   SpO2 99%   BMI 32.37 kg/m   BP Readings from Last 3 Encounters:  04/08/20 (!) 158/98  12/26/19 (!) 156/98  12/19/19 134/81    Wt Readings from Last 3 Encounters:  04/08/20 177 lb (80.3 kg)  12/26/19 181 lb (82.1 kg)  12/19/19 179 lb (81.2 kg)     Physical Exam Constitutional:      General: She is not in acute distress.    Appearance: She is well-developed.  Cardiovascular:     Rate and Rhythm: Normal rate and regular rhythm.  Pulmonary:     Breath sounds: Normal breath sounds.  Skin:    General: Skin is warm and dry.  Neurological:     Mental Status: She is alert and oriented to person, place, and time.       Assessment & Plan:   Brittany Conrad was seen today for hypertension.  Diagnoses and all orders for this visit:  Essential hypertension -     CBC with Differential/Platelet -     CMP14+EGFR  Need for immunization  against influenza -     Flu Vaccine QUAD High Dose(Fluad)  Other orders -     amLODipine (NORVASC) 5 MG tablet; Take 1 tablet (5 mg total) by mouth daily. For blood pressure       I have discontinued Brittany Conrad Myung's LORazepam. I am also having her start on amLODipine. Additionally, I am having her maintain her albuterol, ibuprofen, CVS D3, HYDROcodone-acetaminophen, gabapentin, pantoprazole, Klor-Con M20, busPIRone, amitriptyline, furosemide, and hydrOXYzine.  Allergies as of 04/08/2020   No Known Allergies     Medication List       Accurate as of April 08, 2020 10:02 PM. If you have any questions, ask your nurse or  doctor.        STOP taking these medications   LORazepam 0.5 MG tablet Commonly known as: ATIVAN Stopped by: Brittany Fraise, MD     TAKE these medications   albuterol (2.5 MG/3ML) 0.083% nebulizer solution Commonly known as: PROVENTIL Take 3 mLs (2.5 mg total) by nebulization every 6 (six) hours as needed for wheezing or shortness of breath.   amitriptyline 100 MG tablet Commonly known as: ELAVIL TAKE 2 TABLETS (200 MG TOTAL) BY MOUTH AT BEDTIME.   amLODipine 5 MG tablet Commonly known as: NORVASC Take 1 tablet (5 mg total) by mouth daily. For blood pressure Started by: Brittany Fraise, MD   busPIRone 5 MG tablet Commonly known as: BUSPAR Take 1 tablet (5 mg total) by mouth 3 (three) times daily.   CVS D3 25 MCG (1000 UT) capsule Generic drug: Cholecalciferol TAKE 1 CAPSULE (1,000 UNITS TOTAL) BY MOUTH DAILY.   furosemide 40 MG tablet Commonly known as: LASIX TAKE 1 TABLET BY MOUTH TWICE A DAY   gabapentin 300 MG capsule Commonly known as: NEURONTIN Take 300 mg by mouth 3 (three) times daily.   HYDROcodone-acetaminophen 7.5-325 MG tablet Commonly known as: NORCO Take 1 tablet by mouth every 6 (six) hours as needed for moderate pain.   hydrOXYzine 25 MG capsule Commonly known as: VISTARIL Take 1 capsule (25 mg total) by mouth at bedtime as needed (insomnia).   ibuprofen 800 MG tablet Commonly known as: ADVIL Take 800 mg by mouth 3 (three) times daily as needed.   Klor-Con M20 20 MEQ tablet Generic drug: potassium chloride SA TAKE 2 TABLETS (40 MEQ TOTAL) BY MOUTH 3 (THREE) TIMES DAILY. FOR POTASSIUM REPLACEMENT/ SUPPLEMENT   pantoprazole 40 MG tablet Commonly known as: PROTONIX Take 40 mg by mouth 2 (two) times daily.      Review of her record shows that she was seen by her psychiatrist [name] shortly after I saw her last.  [name] also declined to renew her benzodiazepines.  This is because of her concomitant use of opiates.  As result that was not  discussed today based on our previous conversation as well.  However she declined to make follow-up here instead saying that her pain clinic doctor checked her blood pressure and she with follow-up there.  She will need to follow-up here for refills eventually however. Follow-up: Return in about 1 month (around 05/09/2020).  Brittany Conrad, M.D.

## 2020-04-09 LAB — CBC WITH DIFFERENTIAL/PLATELET
Basophils Absolute: 0 10*3/uL (ref 0.0–0.2)
Basos: 0 %
EOS (ABSOLUTE): 0.1 10*3/uL (ref 0.0–0.4)
Eos: 2 %
Hematocrit: 31.6 % — ABNORMAL LOW (ref 34.0–46.6)
Hemoglobin: 10.4 g/dL — ABNORMAL LOW (ref 11.1–15.9)
Immature Grans (Abs): 0 10*3/uL (ref 0.0–0.1)
Immature Granulocytes: 0 %
Lymphocytes Absolute: 1.2 10*3/uL (ref 0.7–3.1)
Lymphs: 22 %
MCH: 34.1 pg — ABNORMAL HIGH (ref 26.6–33.0)
MCHC: 32.9 g/dL (ref 31.5–35.7)
MCV: 104 fL — ABNORMAL HIGH (ref 79–97)
Monocytes Absolute: 0.7 10*3/uL (ref 0.1–0.9)
Monocytes: 11 %
Neutrophils Absolute: 3.7 10*3/uL (ref 1.4–7.0)
Neutrophils: 65 %
Platelets: 299 10*3/uL (ref 150–450)
RBC: 3.05 x10E6/uL — ABNORMAL LOW (ref 3.77–5.28)
RDW: 14.2 % (ref 11.7–15.4)
WBC: 5.7 10*3/uL (ref 3.4–10.8)

## 2020-04-09 LAB — CMP14+EGFR
ALT: 21 IU/L (ref 0–32)
AST: 32 IU/L (ref 0–40)
Albumin/Globulin Ratio: 1.5 (ref 1.2–2.2)
Albumin: 4.3 g/dL (ref 3.8–4.8)
Alkaline Phosphatase: 200 IU/L — ABNORMAL HIGH (ref 44–121)
BUN/Creatinine Ratio: 13 (ref 12–28)
BUN: 15 mg/dL (ref 8–27)
Bilirubin Total: 1 mg/dL (ref 0.0–1.2)
CO2: 23 mmol/L (ref 20–29)
Calcium: 8.5 mg/dL — ABNORMAL LOW (ref 8.7–10.3)
Chloride: 103 mmol/L (ref 96–106)
Creatinine, Ser: 1.18 mg/dL — ABNORMAL HIGH (ref 0.57–1.00)
GFR calc Af Amer: 55 mL/min/{1.73_m2} — ABNORMAL LOW (ref >59)
GFR calc non Af Amer: 48 mL/min/{1.73_m2} — ABNORMAL LOW (ref >59)
Globulin, Total: 2.8 g/dL (ref 1.5–4.5)
Glucose: 114 mg/dL — ABNORMAL HIGH (ref 65–99)
Potassium: 3.9 mmol/L (ref 3.5–5.2)
Sodium: 142 mmol/L (ref 134–144)
Total Protein: 7.1 g/dL (ref 6.0–8.5)

## 2020-04-12 NOTE — Progress Notes (Deleted)
Referring Provider: Gwenlyn Fudge, FNP Primary Care Physician:  Mechele Claude, MD Primary Gastroenterologist:  Dr. Bonnetta Barry chief complaint on file.   HPI:   Brittany Conrad is a 69 y.o. female presenting today at the request of Gwenlyn Fudge, FNP for epigastric pain, RUQ pain, nausea and vomiting.  Office visit 12/05/2019 with PCP: Patient with complaint of pain between shoulder blades and epigastric pain.  Felt she swallowed an apple that was stuck in her epigastric region.  Unable to eat without throwing up yesterday or the day of the office visit.  She was scheduled for RUQ ultrasound and advised to return if symptoms persisted.  RUQ ultrasound 12/05/2019: Previous cholecystectomy, CBD 9.5 mm, within expected range given previous cholecystectomy, hepatic steatosis.  Today:    Past Medical History:  Diagnosis Date  . Asthma   . Bipolar 1 disorder (HCC)   . COPD (chronic obstructive pulmonary disease) (HCC)   . Emphysema lung (HCC)   . Emphysema of lung (HCC)   . Neuropathy   . Scoliosis     Past Surgical History:  Procedure Laterality Date  . ABDOMINAL HYSTERECTOMY    . BACK SURGERY    . BREAST REDUCTION SURGERY    . GALLBLADDER SURGERY    . GASTRIC BYPASS    . tummy tuck      Current Outpatient Medications  Medication Sig Dispense Refill  . albuterol (PROVENTIL) (2.5 MG/3ML) 0.083% nebulizer solution Take 3 mLs (2.5 mg total) by nebulization every 6 (six) hours as needed for wheezing or shortness of breath. 150 mL 1  . amitriptyline (ELAVIL) 100 MG tablet TAKE 2 TABLETS (200 MG TOTAL) BY MOUTH AT BEDTIME. 180 tablet 0  . amLODipine (NORVASC) 5 MG tablet Take 1 tablet (5 mg total) by mouth daily. For blood pressure 30 tablet 5  . busPIRone (BUSPAR) 5 MG tablet Take 1 tablet (5 mg total) by mouth 3 (three) times daily. 270 tablet 0  . CVS D3 25 MCG (1000 UT) capsule TAKE 1 CAPSULE (1,000 UNITS TOTAL) BY MOUTH DAILY. 120 capsule 11  . furosemide (LASIX) 40 MG tablet  TAKE 1 TABLET BY MOUTH TWICE A DAY 180 tablet 0  . gabapentin (NEURONTIN) 300 MG capsule Take 300 mg by mouth 3 (three) times daily.     Marland Kitchen HYDROcodone-acetaminophen (NORCO) 7.5-325 MG tablet Take 1 tablet by mouth every 6 (six) hours as needed for moderate pain.    . hydrOXYzine (VISTARIL) 25 MG capsule Take 1 capsule (25 mg total) by mouth at bedtime as needed (insomnia). 90 capsule 0  . ibuprofen (ADVIL,MOTRIN) 800 MG tablet Take 800 mg by mouth 3 (three) times daily as needed.   0  . KLOR-CON M20 20 MEQ tablet TAKE 2 TABLETS (40 MEQ TOTAL) BY MOUTH 3 (THREE) TIMES DAILY. FOR POTASSIUM REPLACEMENT/ SUPPLEMENT 540 tablet 1  . pantoprazole (PROTONIX) 40 MG tablet Take 40 mg by mouth 2 (two) times daily.     No current facility-administered medications for this visit.    Allergies as of 04/14/2020  . (No Known Allergies)    Family History  Problem Relation Age of Onset  . Cancer Mother        bone  . Cancer Father        liver, lungs  . Bipolar disorder Daughter   . Alzheimer's disease Maternal Grandmother   . Tuberculosis Maternal Grandfather   . Cancer Paternal Grandmother   . Stroke Paternal Grandfather     Social History  Socioeconomic History  . Marital status: Widowed    Spouse name: Not on file  . Number of children: 2  . Years of education: 49  . Highest education level: 11th grade  Occupational History  . Occupation: Disabled  Tobacco Use  . Smoking status: Former Smoker    Packs/day: 2.00    Years: 35.00    Pack years: 70.00    Types: Cigarettes    Quit date: 07/05/2016    Years since quitting: 3.7  . Smokeless tobacco: Never Used  Vaping Use  . Vaping Use: Never used  Substance and Sexual Activity  . Alcohol use: Yes    Comment: occasional  . Drug use: Never  . Sexual activity: Not Currently  Other Topics Concern  . Not on file  Social History Narrative  . Not on file   Social Determinants of Health   Financial Resource Strain:   . Difficulty of  Paying Living Expenses: Not on file  Food Insecurity:   . Worried About Programme researcher, broadcasting/film/video in the Last Year: Not on file  . Ran Out of Food in the Last Year: Not on file  Transportation Needs:   . Lack of Transportation (Medical): Not on file  . Lack of Transportation (Non-Medical): Not on file  Physical Activity:   . Days of Exercise per Week: Not on file  . Minutes of Exercise per Session: Not on file  Stress:   . Feeling of Stress : Not on file  Social Connections:   . Frequency of Communication with Friends and Family: Not on file  . Frequency of Social Gatherings with Friends and Family: Not on file  . Attends Religious Services: Not on file  . Active Member of Clubs or Organizations: Not on file  . Attends Banker Meetings: Not on file  . Marital Status: Not on file  Intimate Partner Violence:   . Fear of Current or Ex-Partner: Not on file  . Emotionally Abused: Not on file  . Physically Abused: Not on file  . Sexually Abused: Not on file    Review of Systems: Gen: Denies any fever, chills, fatigue, weight loss, lack of appetite.  CV: Denies chest pain, heart palpitations, peripheral edema, syncope.  Resp: Denies shortness of breath at rest or with exertion. Denies wheezing or cough.  GI: Denies dysphagia or odynophagia. Denies jaundice, hematemesis, fecal incontinence. GU : Denies urinary burning, urinary frequency, urinary hesitancy MS: Denies joint pain, muscle weakness, cramps, or limitation of movement.  Derm: Denies rash, itching, dry skin Psych: Denies depression, anxiety, memory loss, and confusion Heme: Denies bruising, bleeding, and enlarged lymph nodes.  Physical Exam: There were no vitals taken for this visit. General:   Alert and oriented. Pleasant and cooperative. Well-nourished and well-developed.  Head:  Normocephalic and atraumatic. Eyes:  Without icterus, sclera clear and conjunctiva pink.  Ears:  Normal auditory acuity. Nose:  No  deformity, discharge,  or lesions. Mouth:  No deformity or lesions, oral mucosa pink.  Neck:  Supple, without mass or thyromegaly. Lungs:  Clear to auscultation bilaterally. No wheezes, rales, or rhonchi. No distress.  Heart:  S1, S2 present without murmurs appreciated.  Abdomen:  +BS, soft, non-tender and non-distended. No HSM noted. No guarding or rebound. No masses appreciated.  Rectal:  Deferred  Msk:  Symmetrical without gross deformities. Normal posture. Pulses:  Normal pulses noted. Extremities:  Without clubbing or edema. Neurologic:  Alert and  oriented x4;  grossly normal neurologically. Skin:  Intact without significant lesions or rashes. Cervical Nodes:  No significant cervical adenopathy. Psych:  Alert and cooperative. Normal mood and affect.

## 2020-04-14 ENCOUNTER — Ambulatory Visit: Payer: Medicare HMO | Admitting: Gastroenterology

## 2020-04-18 DIAGNOSIS — M961 Postlaminectomy syndrome, not elsewhere classified: Secondary | ICD-10-CM | POA: Diagnosis not present

## 2020-04-18 DIAGNOSIS — F319 Bipolar disorder, unspecified: Secondary | ICD-10-CM | POA: Diagnosis not present

## 2020-04-18 DIAGNOSIS — G609 Hereditary and idiopathic neuropathy, unspecified: Secondary | ICD-10-CM | POA: Diagnosis not present

## 2020-04-18 DIAGNOSIS — Z79899 Other long term (current) drug therapy: Secondary | ICD-10-CM | POA: Diagnosis not present

## 2020-04-18 DIAGNOSIS — F1120 Opioid dependence, uncomplicated: Secondary | ICD-10-CM | POA: Diagnosis not present

## 2020-04-23 ENCOUNTER — Other Ambulatory Visit: Payer: Self-pay | Admitting: Family Medicine

## 2020-04-24 ENCOUNTER — Encounter: Payer: Self-pay | Admitting: Family Medicine

## 2020-04-25 ENCOUNTER — Telehealth: Payer: Self-pay

## 2020-04-25 NOTE — Telephone Encounter (Signed)
Dr Darlyn Read, I have pended for your review

## 2020-04-25 NOTE — Telephone Encounter (Signed)
Pt called stating that she called Dr Bing Matter office to get a refill on her Buspar Rx and was informed that she is no longer a patient there.   Wants to know if we can refill the Rx for her. Says she can't go without it because its for her bipolar disorder.

## 2020-04-27 MED ORDER — BUSPIRONE HCL 5 MG PO TABS
5.0000 mg | ORAL_TABLET | Freq: Three times a day (TID) | ORAL | 0 refills | Status: DC
Start: 2020-04-27 — End: 2020-07-21

## 2020-04-28 NOTE — Telephone Encounter (Signed)
Pt aware.

## 2020-04-28 NOTE — Telephone Encounter (Signed)
It looks like the prescription for BuSpar was sent in by Dr. Darlyn Read yesterday 10/24, have patient check the pharmacy

## 2020-04-28 NOTE — Telephone Encounter (Signed)
Pt aware [facility] is out this week. She cannot wait a week. Does she have to wait till he is back in office? Please call back

## 2020-05-15 ENCOUNTER — Telehealth: Payer: Self-pay | Admitting: Family Medicine

## 2020-05-15 NOTE — Telephone Encounter (Signed)
REFERRAL REQUEST Telephone Note  Have you been seen at our office for this problem? Not sure - Patient left VM on Ref Line (Advise that they may need an appointment with their PCP before a referral can be done)  Reason for Referral:  Referral discussed with patient:  Best contact number of patient for referral team:    Has patient been seen by a specialist for this issue before:  Patient provider preference for referral: [facility] for [facility] Issues. Patient location preference for referral:    Patient notified that referrals can take up to a week or longer to process. If they haven't heard anything within a week they should call back and speak with the referral department.

## 2020-05-17 NOTE — Telephone Encounter (Signed)
Pt. Needs to be seen for this. Thanks, WS 

## 2020-05-19 DIAGNOSIS — E669 Obesity, unspecified: Secondary | ICD-10-CM | POA: Diagnosis not present

## 2020-05-19 DIAGNOSIS — Z79899 Other long term (current) drug therapy: Secondary | ICD-10-CM | POA: Diagnosis not present

## 2020-05-19 DIAGNOSIS — G609 Hereditary and idiopathic neuropathy, unspecified: Secondary | ICD-10-CM | POA: Diagnosis not present

## 2020-05-19 DIAGNOSIS — F319 Bipolar disorder, unspecified: Secondary | ICD-10-CM | POA: Diagnosis not present

## 2020-05-19 DIAGNOSIS — R6889 Other general symptoms and signs: Secondary | ICD-10-CM | POA: Diagnosis not present

## 2020-05-19 NOTE — Telephone Encounter (Signed)
lmtcb

## 2020-05-20 ENCOUNTER — Other Ambulatory Visit: Payer: Self-pay | Admitting: Family Medicine

## 2020-05-21 NOTE — Telephone Encounter (Signed)
No answer, no voicemail.

## 2020-05-26 ENCOUNTER — Other Ambulatory Visit: Payer: Self-pay | Admitting: Family Medicine

## 2020-06-12 DIAGNOSIS — R6889 Other general symptoms and signs: Secondary | ICD-10-CM | POA: Diagnosis not present

## 2020-06-15 ENCOUNTER — Other Ambulatory Visit (HOSPITAL_COMMUNITY): Payer: Self-pay | Admitting: Psychiatry

## 2020-06-16 DIAGNOSIS — R6889 Other general symptoms and signs: Secondary | ICD-10-CM | POA: Diagnosis not present

## 2020-06-16 DIAGNOSIS — G609 Hereditary and idiopathic neuropathy, unspecified: Secondary | ICD-10-CM | POA: Diagnosis not present

## 2020-06-16 DIAGNOSIS — Z79899 Other long term (current) drug therapy: Secondary | ICD-10-CM | POA: Diagnosis not present

## 2020-06-16 DIAGNOSIS — F319 Bipolar disorder, unspecified: Secondary | ICD-10-CM | POA: Diagnosis not present

## 2020-06-16 DIAGNOSIS — E669 Obesity, unspecified: Secondary | ICD-10-CM | POA: Diagnosis not present

## 2020-06-17 ENCOUNTER — Other Ambulatory Visit: Payer: Self-pay | Admitting: Family Medicine

## 2020-06-17 DIAGNOSIS — Z1339 Encounter for screening examination for other mental health and behavioral disorders: Secondary | ICD-10-CM | POA: Diagnosis not present

## 2020-06-17 DIAGNOSIS — R6889 Other general symptoms and signs: Secondary | ICD-10-CM | POA: Diagnosis not present

## 2020-06-17 DIAGNOSIS — R5383 Other fatigue: Secondary | ICD-10-CM | POA: Diagnosis not present

## 2020-06-17 DIAGNOSIS — Z79899 Other long term (current) drug therapy: Secondary | ICD-10-CM | POA: Diagnosis not present

## 2020-06-17 DIAGNOSIS — F310 Bipolar disorder, current episode hypomanic: Secondary | ICD-10-CM | POA: Diagnosis not present

## 2020-06-17 DIAGNOSIS — F411 Generalized anxiety disorder: Secondary | ICD-10-CM | POA: Diagnosis not present

## 2020-06-17 DIAGNOSIS — E559 Vitamin D deficiency, unspecified: Secondary | ICD-10-CM | POA: Diagnosis not present

## 2020-06-18 ENCOUNTER — Other Ambulatory Visit: Payer: Self-pay | Admitting: Family Medicine

## 2020-07-09 DIAGNOSIS — K219 Gastro-esophageal reflux disease without esophagitis: Secondary | ICD-10-CM | POA: Diagnosis not present

## 2020-07-09 DIAGNOSIS — R1010 Upper abdominal pain, unspecified: Secondary | ICD-10-CM | POA: Diagnosis not present

## 2020-07-09 DIAGNOSIS — R6889 Other general symptoms and signs: Secondary | ICD-10-CM | POA: Diagnosis not present

## 2020-07-09 DIAGNOSIS — Z9229 Personal history of other drug therapy: Secondary | ICD-10-CM | POA: Diagnosis not present

## 2020-07-09 DIAGNOSIS — D649 Anemia, unspecified: Secondary | ICD-10-CM | POA: Diagnosis not present

## 2020-07-15 ENCOUNTER — Other Ambulatory Visit: Payer: Self-pay | Admitting: Family Medicine

## 2020-07-16 DIAGNOSIS — Z79899 Other long term (current) drug therapy: Secondary | ICD-10-CM | POA: Diagnosis not present

## 2020-07-16 DIAGNOSIS — E669 Obesity, unspecified: Secondary | ICD-10-CM | POA: Diagnosis not present

## 2020-07-16 DIAGNOSIS — F319 Bipolar disorder, unspecified: Secondary | ICD-10-CM | POA: Diagnosis not present

## 2020-07-16 DIAGNOSIS — G609 Hereditary and idiopathic neuropathy, unspecified: Secondary | ICD-10-CM | POA: Diagnosis not present

## 2020-07-16 DIAGNOSIS — R6889 Other general symptoms and signs: Secondary | ICD-10-CM | POA: Diagnosis not present

## 2020-07-20 ENCOUNTER — Other Ambulatory Visit: Payer: Self-pay | Admitting: Family Medicine

## 2020-08-04 ENCOUNTER — Other Ambulatory Visit: Payer: Self-pay | Admitting: Family Medicine

## 2020-08-09 ENCOUNTER — Other Ambulatory Visit: Payer: Self-pay | Admitting: Family Medicine

## 2020-08-14 DIAGNOSIS — E669 Obesity, unspecified: Secondary | ICD-10-CM | POA: Diagnosis not present

## 2020-08-14 DIAGNOSIS — Z79899 Other long term (current) drug therapy: Secondary | ICD-10-CM | POA: Diagnosis not present

## 2020-08-14 DIAGNOSIS — G609 Hereditary and idiopathic neuropathy, unspecified: Secondary | ICD-10-CM | POA: Diagnosis not present

## 2020-08-14 DIAGNOSIS — R6889 Other general symptoms and signs: Secondary | ICD-10-CM | POA: Diagnosis not present

## 2020-08-14 DIAGNOSIS — F319 Bipolar disorder, unspecified: Secondary | ICD-10-CM | POA: Diagnosis not present

## 2020-08-20 DIAGNOSIS — F310 Bipolar disorder, current episode hypomanic: Secondary | ICD-10-CM | POA: Diagnosis not present

## 2020-08-20 DIAGNOSIS — Z653 Problems related to other legal circumstances: Secondary | ICD-10-CM | POA: Diagnosis not present

## 2020-08-20 DIAGNOSIS — F411 Generalized anxiety disorder: Secondary | ICD-10-CM | POA: Diagnosis not present

## 2020-08-20 DIAGNOSIS — Z658 Other specified problems related to psychosocial circumstances: Secondary | ICD-10-CM | POA: Diagnosis not present

## 2020-09-07 DIAGNOSIS — Z7952 Long term (current) use of systemic steroids: Secondary | ICD-10-CM | POA: Diagnosis not present

## 2020-09-07 DIAGNOSIS — Z7951 Long term (current) use of inhaled steroids: Secondary | ICD-10-CM | POA: Diagnosis not present

## 2020-09-07 DIAGNOSIS — Z792 Long term (current) use of antibiotics: Secondary | ICD-10-CM | POA: Diagnosis not present

## 2020-09-07 DIAGNOSIS — K219 Gastro-esophageal reflux disease without esophagitis: Secondary | ICD-10-CM | POA: Diagnosis not present

## 2020-09-07 DIAGNOSIS — D649 Anemia, unspecified: Secondary | ICD-10-CM | POA: Diagnosis not present

## 2020-09-07 DIAGNOSIS — J1282 Pneumonia due to coronavirus disease 2019: Secondary | ICD-10-CM | POA: Diagnosis not present

## 2020-09-07 DIAGNOSIS — Z87891 Personal history of nicotine dependence: Secondary | ICD-10-CM | POA: Diagnosis not present

## 2020-09-07 DIAGNOSIS — I509 Heart failure, unspecified: Secondary | ICD-10-CM | POA: Diagnosis not present

## 2020-09-07 DIAGNOSIS — R0602 Shortness of breath: Secondary | ICD-10-CM | POA: Diagnosis not present

## 2020-09-07 DIAGNOSIS — J441 Chronic obstructive pulmonary disease with (acute) exacerbation: Secondary | ICD-10-CM | POA: Diagnosis not present

## 2020-09-07 DIAGNOSIS — F32A Depression, unspecified: Secondary | ICD-10-CM | POA: Diagnosis not present

## 2020-09-07 DIAGNOSIS — J9601 Acute respiratory failure with hypoxia: Secondary | ICD-10-CM | POA: Diagnosis not present

## 2020-09-07 DIAGNOSIS — J440 Chronic obstructive pulmonary disease with acute lower respiratory infection: Secondary | ICD-10-CM | POA: Diagnosis not present

## 2020-09-07 DIAGNOSIS — J9600 Acute respiratory failure, unspecified whether with hypoxia or hypercapnia: Secondary | ICD-10-CM | POA: Insufficient documentation

## 2020-09-07 DIAGNOSIS — I10 Essential (primary) hypertension: Secondary | ICD-10-CM | POA: Diagnosis not present

## 2020-09-07 DIAGNOSIS — M545 Low back pain, unspecified: Secondary | ICD-10-CM | POA: Diagnosis not present

## 2020-09-07 DIAGNOSIS — R110 Nausea: Secondary | ICD-10-CM | POA: Diagnosis not present

## 2020-09-07 DIAGNOSIS — R059 Cough, unspecified: Secondary | ICD-10-CM | POA: Diagnosis not present

## 2020-09-07 DIAGNOSIS — R0902 Hypoxemia: Secondary | ICD-10-CM | POA: Diagnosis not present

## 2020-09-07 DIAGNOSIS — M549 Dorsalgia, unspecified: Secondary | ICD-10-CM | POA: Diagnosis not present

## 2020-09-07 DIAGNOSIS — Z9981 Dependence on supplemental oxygen: Secondary | ICD-10-CM | POA: Diagnosis not present

## 2020-09-07 DIAGNOSIS — R069 Unspecified abnormalities of breathing: Secondary | ICD-10-CM | POA: Diagnosis not present

## 2020-09-07 DIAGNOSIS — D539 Nutritional anemia, unspecified: Secondary | ICD-10-CM | POA: Diagnosis not present

## 2020-09-07 DIAGNOSIS — R064 Hyperventilation: Secondary | ICD-10-CM | POA: Diagnosis not present

## 2020-09-07 DIAGNOSIS — U071 COVID-19: Secondary | ICD-10-CM | POA: Diagnosis not present

## 2020-09-07 DIAGNOSIS — J449 Chronic obstructive pulmonary disease, unspecified: Secondary | ICD-10-CM | POA: Diagnosis not present

## 2020-09-07 DIAGNOSIS — F329 Major depressive disorder, single episode, unspecified: Secondary | ICD-10-CM | POA: Diagnosis not present

## 2020-09-07 DIAGNOSIS — R600 Localized edema: Secondary | ICD-10-CM | POA: Diagnosis not present

## 2020-09-07 DIAGNOSIS — S2231XA Fracture of one rib, right side, initial encounter for closed fracture: Secondary | ICD-10-CM | POA: Diagnosis not present

## 2020-09-07 DIAGNOSIS — G8929 Other chronic pain: Secondary | ICD-10-CM | POA: Diagnosis not present

## 2020-09-07 DIAGNOSIS — R000 Tachycardia, unspecified: Secondary | ICD-10-CM | POA: Diagnosis not present

## 2020-09-08 DIAGNOSIS — Z7952 Long term (current) use of systemic steroids: Secondary | ICD-10-CM | POA: Diagnosis not present

## 2020-09-08 DIAGNOSIS — D539 Nutritional anemia, unspecified: Secondary | ICD-10-CM | POA: Diagnosis not present

## 2020-09-08 DIAGNOSIS — U071 COVID-19: Secondary | ICD-10-CM | POA: Diagnosis not present

## 2020-09-08 DIAGNOSIS — J441 Chronic obstructive pulmonary disease with (acute) exacerbation: Secondary | ICD-10-CM | POA: Diagnosis not present

## 2020-09-08 DIAGNOSIS — J9601 Acute respiratory failure with hypoxia: Secondary | ICD-10-CM | POA: Diagnosis not present

## 2020-09-08 DIAGNOSIS — Z792 Long term (current) use of antibiotics: Secondary | ICD-10-CM | POA: Diagnosis not present

## 2020-09-08 DIAGNOSIS — Z9981 Dependence on supplemental oxygen: Secondary | ICD-10-CM | POA: Diagnosis not present

## 2020-09-08 DIAGNOSIS — J1282 Pneumonia due to coronavirus disease 2019: Secondary | ICD-10-CM | POA: Diagnosis not present

## 2020-09-09 DIAGNOSIS — D649 Anemia, unspecified: Secondary | ICD-10-CM | POA: Diagnosis not present

## 2020-09-09 DIAGNOSIS — J441 Chronic obstructive pulmonary disease with (acute) exacerbation: Secondary | ICD-10-CM | POA: Diagnosis not present

## 2020-09-09 DIAGNOSIS — M545 Low back pain, unspecified: Secondary | ICD-10-CM | POA: Diagnosis not present

## 2020-09-09 DIAGNOSIS — U071 COVID-19: Secondary | ICD-10-CM | POA: Diagnosis not present

## 2020-09-09 DIAGNOSIS — R110 Nausea: Secondary | ICD-10-CM | POA: Diagnosis not present

## 2020-09-09 DIAGNOSIS — F329 Major depressive disorder, single episode, unspecified: Secondary | ICD-10-CM | POA: Diagnosis not present

## 2020-09-09 DIAGNOSIS — R0602 Shortness of breath: Secondary | ICD-10-CM | POA: Diagnosis not present

## 2020-09-10 ENCOUNTER — Ambulatory Visit: Payer: Medicare HMO

## 2020-09-10 DIAGNOSIS — J449 Chronic obstructive pulmonary disease, unspecified: Secondary | ICD-10-CM | POA: Diagnosis not present

## 2020-09-11 ENCOUNTER — Telehealth: Payer: Self-pay | Admitting: *Deleted

## 2020-09-11 NOTE — Telephone Encounter (Signed)
........  Transition Care Management Unsuccessful Follow-up Telephone Call  Date of discharge and from where:  09/10/20 [facility] Attempts:  09/11/20 by Hilton Cork  Reason for unsuccessful TCM follow-up call:  No answer and no voicemail

## 2020-09-12 NOTE — Telephone Encounter (Signed)
Contact Date: 09/12/2020 Contacted By: Adella Hare, LPN  Transition Care Management Follow-up Telephone Call  Date of discharge and from where: 09/10/2020 [facility]  Discharge Diagnosis: Hypoxia  How have you been since you were released from the hospital? "great"  Any questions or concerns? No   Items Reviewed:  Did the pt receive and understand the discharge instructions provided? Yes   Medications obtained and verified? Yes   Any new allergies since your discharge? No   Dietary orders reviewed? Yes  Do you have support at home? Yes   Discontinued Medications Amlodipine 5 New Medications Added  amLODIPine 10 MG tablet Commonly known as: NORVASC Take 1 tablet (10 mg total) by mouth daily. Start taking on: September 11, 2020  doxycycline 100 MG capsule Commonly known as: VIBRAMYCIN Take 1 capsule (100 mg total) by mouth every twelve (12) hours for 5 days. TAKE WITH FOOD  predniSONE 20 MG tablet Commonly known as: DELTASONE Take 2 tablets (40 mg total) by mouth daily for 3 days, THEN 1 tablet (20 mg total) daily for 3 days, THEN 0.5 tablets (10 mg total) daily for 3 days. Start taking on: September 11, 2020  rivaroxaban 10 mg tablet Commonly known as: XARELTO Take 1 tablet (10 mg total) by mouth daily with evening meal.  sucralfate 1 gram tablet Commonly known as: CARAFATE Take 1 tablet (1 g total) by mouth Three (3) times a day before meals.   CHANGE how you take these medications  amitriptyline 100 MG tablet Commonly known as: ELAVIL Take 200 mg by mouth. What changed: Another medication with the same name was removed. Continue taking this medication, and follow the directions you see here.  pantoprazole 40 MG tablet Commonly known as: PROTONIX Take 40 mg by mouth two (2) times a day. What changed: Another medication with the same name was removed. Continue taking this medication, and follow the directions you see here.  traMADoL 50 mg tablet Commonly known as:  ULTRAM Take 50 mg by mouth Three (3) times a day as needed for pain. What changed: Another medication with the same name was removed. Continue taking this medication, and follow the directions you see here.  Current Medication List  CONTINUE taking these medications  albuterol 0.63 mg/3 mL nebulizer solution Commonly known as: ACCUNEB Inhale 1 ampule by nebulization every six (6) hours as needed for wheezing.  albuterol 2.5 mg /3 mL (0.083 %) nebulizer solution Inhale 2.5 mg.  BREO ELLIPTA 100-25 mcg/dose inhaler Generic drug: fluticasone furoate-vilanteroL Inhale 1 puff daily.  busPIRone 10 MG tablet Commonly known as: BUSPAR Take 10 mg by mouth 3 (three) times a day.  clonazePAM 0.5 MG tablet Commonly known as: KlonoPIN Take 0.5 mg by mouth daily.  DULoxetine 60 MG capsule Commonly known as: CYMBALTA Take 60 mg by mouth daily.  folic acid 1 MG tablet Commonly known as: FOLVITE Take 1 mg by mouth daily.  furosemide 40 MG tablet Commonly known as: LASIX Take 40 mg by mouth daily.  gabapentin 400 MG capsule Commonly known as: NEURONTIN TAKE 1 CAPSULE BY MOUTH THREE TIMES A DAY  guaiFENesin 600 mg 12 hr tablet Commonly known as: MUCINEX Take 1,200 mg by mouth Two (2) times a day.  HYDROcodone-acetaminophen 7.5-325 mg per tablet Commonly known as: NORCO TAKE 1 (ONE) TABLET BY MOUTH THREE TIMES DAILY, AS NEEDED, MAX DAILY DOSE: 3 TABLET-DNF TIL 08-16-20  LATUDA 120 mg Tab Generic drug: lurasidone Take 120 mg by mouth daily with evening meal.  LORazepam 0.5  MG tablet Commonly known as: ATIVAN Take 0.5 mg by mouth two (2) times a day.  OLANZapine 10 MG tablet Commonly known as: ZYPREXA 10 mg nightly.  oxyCODONE 5 mg capsule Commonly known as: OXY-IR Take 7.5 mg by mouth 3 (three) times a day.  potassium chloride 20 mEq/15 mL solution Commonly known as: KAYCIEL Take 40 mEq by mouth Three (3) times a day.  KLOR-CON M20 20 MEQ CR tablet Generic drug:  potassium chloride TAKE 2 TABLETS (40 MEQ TOTAL) BY MOUTH 3 (THREE) TIMES DAILY. FOR POTASSIUM REPLACEMENT/ SUPPLEMENT  senna-docusate 8.6-50 mg Commonly known as: PERICOLACE Take 1 tablet by mouth daily.  tiotropium 18 mcg inhalation capsule Commonly known as: SPIRIVA Place 18 mcg into inhaler and inhale daily.  VITAMIN D3 25 mcg (1,000 unit) capsule Generic drug: cholecalciferol (vitamin D3-25 mcg (1,000 unit)) Take 1,000 Units by mouth daily.      Home Care and Equipment/Supplies: Were home health services ordered? yes If so, what is the name of the agency? unknown  Has the agency set up a time to come to the patient's home? no Were any new equipment or medical supplies ordered?  No What is the name of the medical supply agency? n/a Were you able to get the supplies/equipment? not applicable Do you have any questions related to the use of the equipment or supplies? n/a  Functional Questionnaire: (I = Independent and D = Dependent) ADLs: I  Bathing/Dressing- I  Meal Prep- I  Eating- I  Maintaining continence- I  Transferring/Ambulation- I  Managing Meds- I  Follow up appointments reviewed:   PCP Hospital f/u appt confirmed? Yes  Scheduled to see Harlow Mares on 09/16/2020 @ 11 am.  Specialist Hospital f/u appt confirmed? No    Are transportation arrangements needed? No   If their condition worsens, is the pt aware to call PCP or go to the Emergency Dept.? Yes  Was the patient provided with contact information for the PCP's office or ED? Yes  Was to pt encouraged to call back with questions or concerns? Yes

## 2020-09-13 DIAGNOSIS — M25562 Pain in left knee: Secondary | ICD-10-CM | POA: Diagnosis not present

## 2020-09-13 DIAGNOSIS — Z79899 Other long term (current) drug therapy: Secondary | ICD-10-CM | POA: Diagnosis not present

## 2020-09-13 DIAGNOSIS — E669 Obesity, unspecified: Secondary | ICD-10-CM | POA: Diagnosis not present

## 2020-09-13 DIAGNOSIS — G609 Hereditary and idiopathic neuropathy, unspecified: Secondary | ICD-10-CM | POA: Diagnosis not present

## 2020-09-13 DIAGNOSIS — F319 Bipolar disorder, unspecified: Secondary | ICD-10-CM | POA: Diagnosis not present

## 2020-09-13 DIAGNOSIS — M25561 Pain in right knee: Secondary | ICD-10-CM | POA: Diagnosis not present

## 2020-09-16 ENCOUNTER — Ambulatory Visit (INDEPENDENT_AMBULATORY_CARE_PROVIDER_SITE_OTHER): Payer: Medicare HMO | Admitting: Family Medicine

## 2020-09-16 ENCOUNTER — Encounter: Payer: Self-pay | Admitting: Family Medicine

## 2020-09-16 ENCOUNTER — Inpatient Hospital Stay: Payer: Medicare HMO | Admitting: Family Medicine

## 2020-09-16 VITALS — BP 113/78 | HR 112 | Temp 98.2°F

## 2020-09-16 DIAGNOSIS — Z8616 Personal history of COVID-19: Secondary | ICD-10-CM | POA: Diagnosis not present

## 2020-09-16 DIAGNOSIS — J1282 Pneumonia due to coronavirus disease 2019: Secondary | ICD-10-CM | POA: Diagnosis not present

## 2020-09-16 DIAGNOSIS — J449 Chronic obstructive pulmonary disease, unspecified: Secondary | ICD-10-CM

## 2020-09-16 DIAGNOSIS — I10 Essential (primary) hypertension: Secondary | ICD-10-CM

## 2020-09-16 DIAGNOSIS — E876 Hypokalemia: Secondary | ICD-10-CM | POA: Diagnosis not present

## 2020-09-16 MED ORDER — POTASSIUM CHLORIDE 40 MEQ/15ML (20%) PO SOLN: 15.0000 mL | Freq: Three times a day (TID) | ORAL | 3 refills | Status: DC

## 2020-09-16 MED ORDER — ALBUTEROL SULFATE HFA 108 (90 BASE) MCG/ACT IN AERS: 1.0000 | INHALATION_SPRAY | Freq: Four times a day (QID) | RESPIRATORY_TRACT | 3 refills | Status: DC | PRN

## 2020-09-16 MED ORDER — DOXYCYCLINE HYCLATE 100 MG PO TABS
100.0000 mg | ORAL_TABLET | Freq: Two times a day (BID) | ORAL | 0 refills | Status: AC
Start: 2020-09-16 — End: 2020-09-21

## 2020-09-16 MED ORDER — ALBUTEROL SULFATE (2.5 MG/3ML) 0.083% IN NEBU: 2.5000 mg | INHALATION_SOLUTION | Freq: Four times a day (QID) | RESPIRATORY_TRACT | 1 refills | Status: DC | PRN

## 2020-09-16 NOTE — Progress Notes (Signed)
Established Patient Office Visit  Subjective:  Patient ID: Brittany Conrad, female    DOB: 1950-11-29  Age: 70 y.o. MRN: [id number]  CC:  Chief Complaint  Patient presents with  . Transitions Of Care    HPI Brittany Conrad presents for TOC.  Today's visit was for Transitional Care Management.  The patient was discharged from [facility] on 09/10/20 with a primary diagnosis of hypoxia.   Contact with the patient and/or caregiver, by a clinical staff member, was made on 09/12/20 and was documented as a telephone encounter within the EMR.  Through chart review and discussion with the patient I have determined that management of their condition is of moderate complexity.   Brittany Conrad presented to the ED on 09/07/20 with progressive shortness of breath, wheezing, and hypoxia. She was admitted for COPD exacerbation with Covid pneumonia. She was treated with doxycycline, rocephin, and remdesivir. She was on 2L of O2. She was discharged on room air however. A SNF was recommenened for discharge however she declined. She was discharged home on a prednisone taper and xarelto.   She reports that she is feeling much better with improving shortness of breath. She is using her albuteral 3x a day. She report a lingering productive cough. She denies chest pain, edema, fever, or chills. She has been staying well hydrated.   She would like to have her potassium supplement changed to a liquid form as she is really struggling to swallow the pills.    Past Medical History:  Diagnosis Date  . Asthma   . Bipolar 1 disorder ([place])   . COPD (chronic obstructive pulmonary disease) ([place])   . Emphysema lung ([place])   . Emphysema of lung ([place])   . Neuropathy   . Scoliosis     Past Surgical History:  Procedure Laterality Date  . ABDOMINAL HYSTERECTOMY    . BACK SURGERY    . BREAST REDUCTION SURGERY    . GALLBLADDER SURGERY    . GASTRIC BYPASS    . tummy tuck      Family History  Problem Relation Age of Onset   . Cancer Mother        bone  . Cancer Father        liver, lungs  . Bipolar disorder Daughter   . Alzheimer's disease Maternal Grandmother   . Tuberculosis Maternal Grandfather   . Cancer Paternal Grandmother   . Stroke Paternal Grandfather     Social History   Socioeconomic History  . Marital status: Widowed    Spouse name: Not on file  . Number of children: 2  . Years of education: 8  . Highest education level: 11th grade  Occupational History  . Occupation: Disabled  Tobacco Use  . Smoking status: Former Smoker    Packs/day: 2.00    Years: 35.00    Pack years: 70.00    Types: Cigarettes    Quit date: 07/05/2016    Years since quitting: 4.2  . Smokeless tobacco: Never Used  Vaping Use  . Vaping Use: Never used  Substance and Sexual Activity  . Alcohol use: Yes    Comment: occasional  . Drug use: Never  . Sexual activity: Not Currently  Other Topics Concern  . Not on file  Social History Narrative  . Not on file   Social Determinants of Health   Financial Resource Strain: Not on file  Food Insecurity: Not on file  Transportation Needs: Not on file  Physical Activity: Not on file  Stress: Not on file  Social Connections: Not on file  Intimate Partner Violence: Not on file    Outpatient Medications Prior to Visit  Medication Sig Dispense Refill  . albuterol (PROVENTIL) (2.5 MG/3ML) 0.083% nebulizer solution TAKE 3 MLS (2.5 MG TOTAL) BY NEBULIZATION EVERY 6 (SIX) HOURS AS NEEDED FOR WHEEZING OR SHORTNESS OF BREATH. 150 mL 1  . albuterol (VENTOLIN HFA) 108 (90 Base) MCG/ACT inhaler Inhale into the lungs every 6 (six) hours as needed for wheezing or shortness of breath.    Marland Kitchen amitriptyline (ELAVIL) 100 MG tablet Take 2 tablets (200 mg total) by mouth at bedtime. (Needs to be seen before next refill) 60 tablet 0  . amLODipine (NORVASC) 5 MG tablet Take 1 tablet (5 mg total) by mouth daily. For blood pressure 30 tablet 5  . busPIRone (BUSPAR) 5 MG tablet Take 1  tablet (5 mg total) by mouth 3 (three) times daily. (Needs to be seen before next refill) 90 tablet 0  . [employer] D3 25 MCG (1000 UT) capsule TAKE 1 CAPSULE (1,000 UNITS TOTAL) BY MOUTH DAILY. 120 capsule 11  . furosemide (LASIX) 40 MG tablet TAKE 1 TABLET BY MOUTH TWICE A DAY 180 tablet 0  . gabapentin (NEURONTIN) 300 MG capsule Take 300 mg by mouth 3 (three) times daily.     Marland Kitchen HYDROcodone-acetaminophen (NORCO) 7.5-325 MG tablet Take 1 tablet by mouth every 6 (six) hours as needed for moderate pain.    . hydrOXYzine (VISTARIL) 25 MG capsule Take 1 capsule (25 mg total) by mouth at bedtime as needed (insomnia). 90 capsule 0  . ibuprofen (ADVIL,MOTRIN) 800 MG tablet Take 800 mg by mouth 3 (three) times daily as needed.   0  . KLOR-CON M20 20 MEQ tablet TAKE 2 TABLETS (40 MEQ TOTAL) BY MOUTH 3 (THREE) TIMES DAILY. FOR POTASSIUM REPLACEMENT/ SUPPLEMENT 540 tablet 1  . pantoprazole (PROTONIX) 40 MG tablet TAKE 1 TABLET BY MOUTH TWICE A DAY 180 tablet 3   No facility-administered medications prior to visit.    No Known Allergies  ROS Review of Systems As per HPI.    Objective:    Physical Exam Vitals and nursing note reviewed.  Constitutional:      General: She is not in acute distress.    Appearance: She is not ill-appearing, toxic-appearing or diaphoretic.  HENT:     Head: Normocephalic and atraumatic.  Cardiovascular:     Rate and Rhythm: Normal rate and regular rhythm.     Heart sounds: Normal heart sounds. No murmur heard.   Pulmonary:     Effort: Pulmonary effort is normal. No respiratory distress.     Breath sounds: Normal breath sounds. No wheezing, rhonchi or rales.  Chest:     Chest wall: No tenderness.  Abdominal:     General: Bowel sounds are normal. There is no distension.     Palpations: Abdomen is soft.     Tenderness: There is no abdominal tenderness.  Musculoskeletal:     Right lower leg: No edema.     Left lower leg: No edema.  Skin:    General: Skin is warm and  dry.  Neurological:     General: No focal deficit present.     Mental Status: She is alert and oriented to person, place, and time.  Psychiatric:        Mood and Affect: Mood normal.        Behavior: Behavior normal.        Judgment: Judgment normal.  BP 113/78   Pulse (!) 112   Temp 98.2 F (36.8 C) (Temporal)   SpO2 97% Comment: room air Wt Readings from Last 3 Encounters:  04/08/20 177 lb (80.3 kg)  12/26/19 181 lb (82.1 kg)  12/19/19 179 lb (81.2 kg)     Health Maintenance Due  Topic Date Due  . Hepatitis C Screening  Never done  . COVID-19 Vaccine (1) Never done  . COLONOSCOPY (Pts 45-18yr Insurance coverage will need to be confirmed)  Never done  . MAMMOGRAM  Never done  . DEXA SCAN  Never done  . PNA vac Low Risk Adult (1 of 2 - PCV13) Never done    There are no preventive care reminders to display for this patient.  No results found for: TSH Lab Results  Component Value Date   WBC 5.7 04/08/2020   HGB 10.4 (L) 04/08/2020   HCT 31.6 (L) 04/08/2020   MCV 104 (H) 04/08/2020   PLT 299 04/08/2020   Lab Results  Component Value Date   NA 142 04/08/2020   K 3.9 04/08/2020   CO2 23 04/08/2020   GLUCOSE 114 (H) 04/08/2020   BUN 15 04/08/2020   CREATININE 1.18 (H) 04/08/2020   BILITOT 1.0 04/08/2020   ALKPHOS 200 (H) 04/08/2020   AST 32 04/08/2020   ALT 21 04/08/2020   PROT 7.1 04/08/2020   ALBUMIN 4.3 04/08/2020   CALCIUM 8.5 (L) 04/08/2020   Lab Results  Component Value Date   CHOL 188 03/09/2018   Lab Results  Component Value Date   HDL 57 03/09/2018   Lab Results  Component Value Date   LDLCALC 89 03/09/2018   Lab Results  Component Value Date   TRIG 210 (H) 03/09/2018   Lab Results  Component Value Date   CHOLHDL 3.3 03/09/2018   No results found for: HGBA1C    Assessment & Plan:   CAbbeygailwas seen today for transitions of care.  Diagnoses and all orders for this visit:  Pneumonia due to COVID-19 virus Improving shortness  of breath. Pulse ox 97% today. Will continue doxycycline for an additional 5 days.  -     doxycycline (VIBRA-TABS) 100 MG tablet; Take 1 tablet (100 mg total) by mouth 2 (two) times daily for 5 days. 1 po bid  Chronic obstructive pulmonary disease, unspecified COPD type ([place] Refills provided for albuterol inhaler and nebulizer solution. Improving symptoms. Will return for lab work tomorrow.  -     albuterol (PROVENTIL) (2.5 MG/3ML) 0.083% nebulizer solution; Take 3 mLs (2.5 mg total) by nebulization every 6 (six) hours as needed for wheezing or shortness of breath. -     albuterol (VENTOLIN HFA) 108 (90 Base) MCG/ACT inhaler; Inhale 1-2 puffs into the lungs every 6 (six) hours as needed for wheezing or shortness of breath. -     BMP8+EGFR; Future -     CBC with Differential/Platelet; Future  Essential hypertension Well controlled on current regimen. Labs pending as below.  -     BMP8+EGFR; Future -     CBC with Differential/Platelet; Future  Hypokalemia Liquid supplement ordered. Labs pending as below.  -     Potassium Chloride 40 MEQ/15ML (20%) SOLN; Take 15 mLs by mouth in the morning, at noon, and at bedtime. -     BMP8+EGFR; Future   Return to office for new or worsening symptoms, or if symptoms persist.   Follow-up: Return in about 3 weeks (around 10/07/2020) for follow up with PCP.  The patient indicates understanding of these issues and agrees with the plan.    Gwenlyn Perking, FNP

## 2020-09-18 ENCOUNTER — Other Ambulatory Visit: Payer: Medicare HMO

## 2020-09-18 ENCOUNTER — Ambulatory Visit: Payer: Medicare HMO | Admitting: Family Medicine

## 2020-09-18 DIAGNOSIS — J449 Chronic obstructive pulmonary disease, unspecified: Secondary | ICD-10-CM

## 2020-09-18 DIAGNOSIS — E876 Hypokalemia: Secondary | ICD-10-CM

## 2020-09-18 DIAGNOSIS — I10 Essential (primary) hypertension: Secondary | ICD-10-CM | POA: Diagnosis not present

## 2020-09-19 LAB — CBC WITH DIFFERENTIAL/PLATELET
Basophils Absolute: 0 10*3/uL (ref 0.0–0.2)
Basos: 0 %
EOS (ABSOLUTE): 0 10*3/uL (ref 0.0–0.4)
Eos: 0 %
Hematocrit: 29.6 % — ABNORMAL LOW (ref 34.0–46.6)
Hemoglobin: 9.7 g/dL — ABNORMAL LOW (ref 11.1–15.9)
Immature Grans (Abs): 0.2 10*3/uL — ABNORMAL HIGH (ref 0.0–0.1)
Immature Granulocytes: 1 %
Lymphocytes Absolute: 0.8 10*3/uL (ref 0.7–3.1)
Lymphs: 6 %
MCH: 35.5 pg — ABNORMAL HIGH (ref 26.6–33.0)
MCHC: 32.8 g/dL (ref 31.5–35.7)
MCV: 108 fL — ABNORMAL HIGH (ref 79–97)
Monocytes Absolute: 0.8 10*3/uL (ref 0.1–0.9)
Monocytes: 7 %
NRBC: 1 % — ABNORMAL HIGH (ref 0–0)
Neutrophils Absolute: 10.9 10*3/uL — ABNORMAL HIGH (ref 1.4–7.0)
Neutrophils: 86 %
Platelets: 553 10*3/uL — ABNORMAL HIGH (ref 150–450)
RBC: 2.73 x10E6/uL — CL (ref 3.77–5.28)
RDW: 17.1 % — ABNORMAL HIGH (ref 11.7–15.4)
WBC: 12.7 10*3/uL — ABNORMAL HIGH (ref 3.4–10.8)

## 2020-09-19 LAB — BMP8+EGFR
BUN/Creatinine Ratio: 20 (ref 12–28)
BUN: 24 mg/dL (ref 8–27)
CO2: 24 mmol/L (ref 20–29)
Calcium: 9.3 mg/dL (ref 8.7–10.3)
Chloride: 97 mmol/L (ref 96–106)
Creatinine, Ser: 1.2 mg/dL — ABNORMAL HIGH (ref 0.57–1.00)
Glucose: 102 mg/dL — ABNORMAL HIGH (ref 65–99)
Potassium: 4.6 mmol/L (ref 3.5–5.2)
Sodium: 141 mmol/L (ref 134–144)
eGFR: 49 mL/min/{1.73_m2} — ABNORMAL LOW (ref >59)

## 2020-09-21 ENCOUNTER — Other Ambulatory Visit: Payer: Self-pay | Admitting: Family Medicine

## 2020-09-21 DIAGNOSIS — R0602 Shortness of breath: Secondary | ICD-10-CM | POA: Diagnosis not present

## 2020-09-21 DIAGNOSIS — R069 Unspecified abnormalities of breathing: Secondary | ICD-10-CM | POA: Diagnosis not present

## 2020-09-22 ENCOUNTER — Telehealth: Payer: Medicare HMO | Admitting: Family Medicine

## 2020-09-25 ENCOUNTER — Encounter: Payer: Self-pay | Admitting: *Deleted

## 2020-09-29 NOTE — Progress Notes (Signed)
* * *        **  Leah [name]    --- ---    86 Y old Female, DOB: 08-25-1950    [address] [place], [place] [address]    Home: [phone]    Provider: Rosalio Loud        * * *    Telephone Encounter    ---    Answered by   Leah [name]  Date: 12/12/2017         Time: 03:26 PM    Caller   [facility]    --- ---            Reason   Call Back, labs & stool            Message                      Leah [name] from [facility] called and would like a call back regarding this patient and her IV antibiotic. When you call back please have the nurse paged (Leah [name]) [phone]                Action Taken                      Leah [name] 12/12/2017 3:43:04 PM > c/o diarrhea the last few days and pt refusing meds. They drew labs today and will submit a sample when they can.       Leah [name],Leah [name] 12/13/2017 7:50:11 AM > labs rcvd.       Leah [name],Leah [name] 12/14/2017 9:06:05 AM > stool rcvd                    * * *                ---          * * *          Patient: Leah [name], Leah [name] DOB: Sep 27, 1950 Provider: Rosalio Loud  12/12/2017    ---    Note generated by eClinicalWorks EMR/PM Software ([email or web address])

## 2020-09-29 NOTE — Progress Notes (Signed)
* * *        **  [name]    --- ---    63 Y old Female, DOB: 1950/12/08    [address] [place], [place] [address]    Home: [phone]    Provider: Rosalio Loud        * * *    Telephone Encounter    ---    Answered by   Juliann Pares  Date: 10/05/2017         Time: 02:47 PM    Reason   No show Wed. 10/05/2017    --- ---                * * *                ---          * * *          Patient: Leah [name], Leah [name] DOB: 12-18-1950 Provider: Rosalio Loud  10/05/2017    ---    Note generated by eClinicalWorks EMR/PM Software ([email or web address])

## 2020-09-29 NOTE — Progress Notes (Signed)
* * *        Leah [name]**    --- ---    72 Y old Female, DOB: 03/16/1951    Account Number: [id number]    [address], [address], [place], [place]    Home: [phone]    Guarantor: Leah [name] Insurance: [employer]    PCP: Blanchie Serve    Appointment Facility: [facility] ([facility]        * * *    12/20/2017  Progress Notes: Vicenta Aly, MD    --- ---    ---         **Reason for Appointment**    ---       1\. APPT BOOKED W/ [facility] [phone] [id number] / SEPSIS;r/s  from 4/3 due to pt in-patient    ---       **History of Present Illness**    ---     _History_ :    A: This is a 70 year old female with a history of dCHF, CKD, COPD who  presented initially on 3/3 with severe sepsis with likely toxic metabolic  encephalopathy with high grade MSSA bacteremia, lower lumbar spinal  osteomyelitis, epidural abscess, and psoas abscess but nothing threatening  central nerves on MRI. TTE without clear endocarditis. No signs of infection  in T or C spine. Entry point may be from multiple wounds on LE. Blood cultures  negative on 3/4. Her course has been complicated by LLE cellulitis and most  recently R pneumonia so currently on IV cefepime.    She now returns for follow up. Has been on some form of IV antibiotics now for  over 12 weeks. Notes some chronic but unchanged pain in the lower back with  some radiation to anterior thighs but this has not worsened or changed  recently. LE swelling much improved. No N/V, noting some inttermittent  diarrhea but c.diff negative x 2. The rest of the ROS is negative.       **Current Medications**    ---    Taking     * cefepime 1 g powder for injection as directed intravenously every 12 hours    ---    * acetaminophen     ---    * albuterol 2.5 mg/3 mL (0.083%) solution 3 mL by nebulizer every 6 hours    ---    * amitriptyline 100 mg tablet 1 tab(s) orally once a day (at bedtime)    ---    * Ativan 0.5 mg tablet 1 tab(s) orally 3 times a  day    ---    * Breo Ellipta 100 mcg-25 mcg/inh powder 1 puff(s) inhaled once a day    ---    * cholecalciferol 1000 intl units capsule 1 cap(s) orally once a day    ---    * culturelle capsule     ---    * cyanocobalamin 50 mcg tablet 1 tab(s) orally once a day    ---    * Cymbalta 60 mg delayed release capsule 1 cap(s) orally once a day    ---    * folic acid 1 mg tablet 1 tab(s) orally once a day    ---    * furosemide 40 mg tablet 1 tab(s) orally once a day    ---    * guaifenesin 600 mg tablet, extended release 1 tab(s) orally every 12 hours    ---    *  Latuda 120 mg tablet 1 tab(s) orally once a day    ---    * olanzapine 5 mg tablet 1 tab(s) orally once a day    ---    * oxycodone 5 mg tablet 1 tab(s) orally every 6 hours    ---    * potassium chloride 20 mEq/100 mL solution as directed intravenously once    ---    * Protonix 40 mg delayed release tablet 1 tab(s) orally once a day    ---    * tiotropium 18 mcg capsule 1 cap(s) inhaled once a day    ---    * Medication List reviewed and reconciled with the patient    ---       **Past Medical History**    ---       Osteomyelitis.        ---    Sepsis.        ---    Cellulitis - recurrent.        ---    Pneumonia.        ---    UTI.        ---    Bipolar disorder.        ---    Myositis.        ---    Kidney disease.        ---       **Family History**    ---       Non-Contributory    ---       **Social History**    ---    Recr. drug use    Do you or have you ever used drugs? _No_    Alcohol Consumption    Do you consume alcohol? _No_    Tobacco Use (MU)    Smoking Status: _Former Smoker_       **Allergies**    ---       N.K.D.A.    ---       **Vital Signs**    ---    Temp 98.4 F, HR 128, BP 101/72, O2 Sat 98, Weight wheelchair, Taken By: mo    labs due 6/17.       **Examination**    ---     Denton Meek Examination_ :    General Appearance: alert and focused on conversation.    HEENT: no conjunctival injection, sclera non-icteric.    Oral cavity: no pharyngeal  erythema or exudate noted.    Neck, Thyroid : non-tender, no cervical or supraclavicular lymphadenopathy,  supple.    Heart RSR, normal S1S2, no S3 or murmurs appreciated.    Lungs: clear to auscultation, normal, good air exchange.    abdomen soft, NT/ND, BS present.    Neurologic Exam: alert and oriented, no focal signs on gross inspection.    Skin: warm and dry, good turgor.    Back:  Mild lower lumbar central spinous tenderness.    Extremities: no appreciable nailbed clubbing, cyanosis, or edema, R arm PICC  site benign, no erythema or phlebitic cord, no distal arm edema.    CRP 6/17: 6.4 (nl<10).           **Assessments**    ---    1\. 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 Osteomyelitis of lumbar spine**    Stop cefepime powder for injection, 1 g, as directed,  intravenously, every 12  hours    Notes:    She has a normal CRP after over 12 weeks of antibiotics, indicating that any  infection is likely resolved and that her remaining pain is due to the damage  that was done by the infection and not active infection now. As such, will  stop antibiotics and remove PICC and monitor. Follow up in 4-6 weeks.    .    ---      **Follow Up**    ---    6 Weeks    Electronically signed by Vicenta Aly , MD on 12/22/2017 at 02:46 PM EDT    Sign off status: Completed        * * [facility] ([facility])    [address]    [place], [place] [id number]    Tel: [phone]    Fax: [phone]              * * *          Patient: Leah [name], Leah [name] DOB: [birth year] Progress Note: Vicenta Aly, MD  12/20/2017    ---    Note generated by eClinicalWorks EMR/PM Software ([email or web address])

## 2020-09-29 NOTE — Progress Notes (Signed)
* * *        Leah [name]**    --- ---    65 Y old Female, DOB: Dec 23, 1950    Account Number: [id number]    [address], [address], [place], [place]    Home: [phone]    Guarantor: Leah [name] Insurance: [employer]    PCP: Burney Gauze PHO, MD    Appointment Facility: [facility] ([facility]        * * *    01/31/2018  Infectious Diseases Progress Note: Leah Aly, MD    --- ---    ---         **Reason for Appointment**    ---       1\. 6 wk f/u    ---       **History of Present Illness**    ---     _History_ :    A: This is a 70 year old female with a history of dCHF, CKD, COPD who  presented initially on 3/3 with severe sepsis with likely toxic metabolic  encephalopathy with high grade MSSA bacteremia, lower lumbar spinal  osteomyelitis, epidural abscess, and psoas abscess but nothing threatening  central nerves on MRI. TTE without clear endocarditis. No signs of infection  in T or C spine. Entry point may be from multiple wounds on LE. Blood cultures  negative on 3/4. Her course has been complicated by LLE cellulitis and most  recently R pneumonia so currently on IV cefepime.    She now returns for follow up. Completed at least 12 weeks of IV antibiotics  with normalization of inflammatory markers. Has now been off the antibiotics  for about 6 weeks. Notes some chronic but unchanged pain in the lower back  with some radiation to anterior thighs but this has not worsened or changed  recently. No N/V/D. No fevers or chills. The rest of the ROS is negative.       **Current Medications**    ---    Taking     * acetaminophen     ---    * albuterol 2.5 mg/3 mL (0.083%) solution 3 mL by nebulizer every 6 hours    ---    * amitriptyline 100 mg tablet 1 tab(s) orally once a day (at bedtime)    ---    * Ativan 0.5 mg tablet 1 tab(s) orally 3 times a day    ---    * Breo Ellipta 100 mcg-25 mcg/inh powder 1 puff(s) inhaled once a day    ---    * cholecalciferol 1000 intl units  capsule 1 cap(s) orally once a day    ---    * culturelle capsule     ---    * cyanocobalamin 50 mcg tablet 1 tab(s) orally once a day    ---    * Cymbalta 60 mg delayed release capsule 1 cap(s) orally once a day    ---    * folic acid 1 mg tablet 1 tab(s) orally once a day    ---    * furosemide 40 mg tablet 1 tab(s) orally once a day    ---    * guaifenesin 600 mg tablet, extended release 1 tab(s) orally every 12 hours    ---    * Latuda 120 mg tablet 1 tab(s) orally once a day    ---    * olanzapine 5 mg tablet 1 tab(s) orally once a day    ---    *  oxycodone 5 mg tablet 1 tab(s) orally every 6 hours    ---    * potassium chloride 20 mEq/100 mL solution as directed intravenously once    ---    * Protonix 40 mg delayed release tablet 1 tab(s) orally once a day    ---    * tiotropium 18 mcg capsule 1 cap(s) inhaled once a day    ---    * tramadol 50 mg tablet 1 tab(s) orally every 4 hours    ---    * Medication List reviewed and reconciled with the patient    ---       **Past Medical History**    ---       Osteomyelitis.        ---    Sepsis.        ---    Cellulitis - recurrent.        ---    Pneumonia.        ---    UTI.        ---    Bipolar disorder.        ---    Myositis.        ---    Kidney disease.        ---       **Surgical History**    ---       No Surgical History documented.    ---       **Family History**    ---       No Family History documented.    ---       **Social History**    ---    Recr. drug use    Do you or have you ever used drugs? _No_    Alcohol Consumption    Do you consume alcohol? _No_    Tobacco Use (MU)    Smoking Status: _Former Smoker_       **Allergies**    ---       N.K.D.A.    ---       **Hospitalization/Major Diagnostic Procedure**    ---       No Hospitalization History.    ---       **Vital Signs**    ---    Temp 98.7 F, HR 105, BP 117/77, O2 Sat 99, Weight wheelchair, Taken By: mo.       **Examination**    ---     Denton Meek Examination_ :    General Appearance: alert and  focused on conversation.    HEENT: no conjunctival injection, sclera non-icteric.    Oral cavity: no pharyngeal erythema or exudate noted.    Neck, Thyroid : non-tender, no cervical or supraclavicular lymphadenopathy,  supple.    Heart RSR, normal S1S2, no S3 or murmurs appreciated.    Lungs: clear to auscultation, normal, good air exchange.    abdomen soft, NT/ND, BS present.    Neurologic Exam: alert and oriented, no focal signs on gross inspection.    Skin: warm and dry, good turgor.    Back:  Mild lower lumbar central spinous tenderness.    Extremities: no appreciable nailbed clubbing, cyanosis, or edema, R arm PICC  site benign, no erythema or phlebitic cord, no distal arm edema.    CRP 6/17: 6.4 (nl<10).           **Assessments**    ---    1\. 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 Osteomyelitis of lumbar spine**    Notes:    She has a normal CRP after over 12 weeks of antibiotics, indicating that any  infection is likely resolved and that her remaining pain is due to the damage  that was done by the infection and not active infection now. No clear signs of  recurrence off antibiotics now for 6 weeks which bodes well. No further  antibiotics, lab work, or ID follow up needed at this time.    .    ---      **Follow Up**    ---    prn    Electronically signed by Leah [name] , MD on 02/02/2018 at 12:37 PM EDT    Sign off status: Completed        * * [facility] ([facility]    [address]    [place], [place] [id number]    Tel: [phone]    Fax: [phone]              * * *          Patient: Leah [name], Leah [name] DOB: 07-07-1950 Progress Note: Leah Aly, MD  01/31/2018    ---    Note generated by eClinicalWorks EMR/PM Software ([email or web address])

## 2020-10-07 ENCOUNTER — Telehealth: Payer: Self-pay

## 2020-10-07 NOTE — Telephone Encounter (Signed)
CALLED PATIENT, NO ANSWER, MAILBOX FULL

## 2020-10-07 NOTE — Telephone Encounter (Signed)
She needs to be symptom free for 5 days before going to the pain clinic. I cannot prescribe those medications for her

## 2020-10-07 NOTE — Telephone Encounter (Signed)
Patient calls back and said the Potassium Chloride 40 MEQ is going to cost her $196.00 and wants the pills called in.

## 2020-10-08 ENCOUNTER — Ambulatory Visit: Payer: Medicare HMO | Admitting: Family Medicine

## 2020-10-09 ENCOUNTER — Other Ambulatory Visit: Payer: Self-pay | Admitting: Family Medicine

## 2020-10-10 DIAGNOSIS — Z79899 Other long term (current) drug therapy: Secondary | ICD-10-CM | POA: Diagnosis not present

## 2020-10-10 DIAGNOSIS — G609 Hereditary and idiopathic neuropathy, unspecified: Secondary | ICD-10-CM | POA: Diagnosis not present

## 2020-10-10 DIAGNOSIS — F319 Bipolar disorder, unspecified: Secondary | ICD-10-CM | POA: Diagnosis not present

## 2020-10-10 DIAGNOSIS — E669 Obesity, unspecified: Secondary | ICD-10-CM | POA: Diagnosis not present

## 2020-10-23 ENCOUNTER — Encounter: Payer: Self-pay | Admitting: Family Medicine

## 2020-10-23 ENCOUNTER — Ambulatory Visit (INDEPENDENT_AMBULATORY_CARE_PROVIDER_SITE_OTHER): Payer: Medicare HMO | Admitting: Family Medicine

## 2020-10-23 ENCOUNTER — Other Ambulatory Visit: Payer: Self-pay

## 2020-10-23 VITALS — BP 127/86 | HR 100 | Temp 98.0°F | Ht 62.0 in | Wt 174.0 lb

## 2020-10-23 DIAGNOSIS — I509 Heart failure, unspecified: Secondary | ICD-10-CM

## 2020-10-23 DIAGNOSIS — F3341 Major depressive disorder, recurrent, in partial remission: Secondary | ICD-10-CM

## 2020-10-23 DIAGNOSIS — F1320 Sedative, hypnotic or anxiolytic dependence, uncomplicated: Secondary | ICD-10-CM | POA: Diagnosis not present

## 2020-10-23 DIAGNOSIS — I10 Essential (primary) hypertension: Secondary | ICD-10-CM | POA: Diagnosis not present

## 2020-10-23 DIAGNOSIS — J449 Chronic obstructive pulmonary disease, unspecified: Secondary | ICD-10-CM | POA: Diagnosis not present

## 2020-10-23 DIAGNOSIS — Z1159 Encounter for screening for other viral diseases: Secondary | ICD-10-CM

## 2020-10-23 DIAGNOSIS — Z23 Encounter for immunization: Secondary | ICD-10-CM

## 2020-10-23 DIAGNOSIS — G894 Chronic pain syndrome: Secondary | ICD-10-CM

## 2020-10-23 MED ORDER — OLANZAPINE 10 MG PO TABS: 10.0000 mg | ORAL_TABLET | Freq: Every day | ORAL | 1 refills | Status: DC

## 2020-10-23 MED ORDER — AMLODIPINE BESYLATE 10 MG PO TABS: 10.0000 mg | ORAL_TABLET | Freq: Every day | ORAL | 1 refills | Status: DC

## 2020-10-23 MED ORDER — AMITRIPTYLINE HCL 100 MG PO TABS: 200.0000 mg | ORAL_TABLET | Freq: Every day | ORAL | 1 refills | Status: DC

## 2020-10-23 NOTE — Progress Notes (Signed)
Subjective:  Patient ID: Brittany Conrad, female    DOB: 1950-09-06  Age: 69 y.o. MRN: [id number]  CC: Medical Management of Chronic Issues   HPI Brittany Conrad presents for COPD cough worse since COVID dx 3 weeks ago. Gets short of breath at rest. Also has a history of CHF. No recent edema.   Depression screen [facility] 2/9 10/23/2020 04/08/2020 12/26/2019  Decreased Interest 2 0 1  Down, Depressed, Hopeless 1 0 0  PHQ - 2 Score 3 0 1  Altered sleeping 1 - 0  Tired, decreased energy 1 - 3  Change in appetite 2 - 0  Feeling bad or failure about yourself  2 - 0  Trouble concentrating 2 - 0  Moving slowly or fidgety/restless 2 - 0  Suicidal thoughts 0 - 0  PHQ-9 Score 13 - 4  Difficult doing work/chores Not difficult at all - Somewhat difficult    History Brittany Conrad has a past medical history of Asthma, Bipolar 1 disorder (HCC), COPD (chronic obstructive pulmonary disease) (HCC), Emphysema lung (HCC), Emphysema of lung (HCC), Neuropathy, and Scoliosis.   She has a past surgical history that includes Abdominal hysterectomy; Gallbladder surgery; Breast reduction surgery; tummy tuck; Back surgery; and Gastric bypass.   Her family history includes Alzheimer's disease in her maternal grandmother; Bipolar disorder in her daughter; Cancer in her father, mother, and paternal grandmother; Stroke in her paternal grandfather; Tuberculosis in her maternal grandfather.She reports that she quit smoking about 4 years ago. Her smoking use included cigarettes. She has a 70.00 pack-year smoking history. She has never used smokeless tobacco. She reports current alcohol use. She reports that she does not use drugs.    ROS Review of Systems  Constitutional: Negative.   HENT: Negative for congestion.   Eyes: Negative for visual disturbance.  Respiratory: Positive for cough and shortness of breath.   Cardiovascular: Negative for chest pain.  Gastrointestinal: Negative for abdominal pain, constipation, diarrhea,  nausea and vomiting.  Genitourinary: Negative for difficulty urinating.  Musculoskeletal: Negative for arthralgias and myalgias.  Neurological: Negative for headaches.  Psychiatric/Behavioral: Negative for sleep disturbance.    Objective:  BP 127/86   Pulse 100   Temp 98 F (36.7 C) (Temporal)   Ht 5\' 2"  (1.575 m)   Wt 174 lb (78.9 kg)   SpO2 99%   BMI 31.83 kg/m   BP Readings from Last 3 Encounters:  10/23/20 127/86  09/16/20 113/78  04/08/20 (!) 158/98    Wt Readings from Last 3 Encounters:  10/23/20 174 lb (78.9 kg)  04/08/20 177 lb (80.3 kg)  12/26/19 181 lb (82.1 kg)     Physical Exam Constitutional:      General: She is not in acute distress.    Appearance: She is well-developed.  HENT:     Head: Normocephalic and atraumatic.  Eyes:     Conjunctiva/sclera: Conjunctivae normal.     Pupils: Pupils are equal, round, and reactive to light.  Neck:     Thyroid: No thyromegaly.  Cardiovascular:     Rate and Rhythm: Normal rate and regular rhythm.     Heart sounds: Normal heart sounds. No murmur heard.   Pulmonary:     Effort: Pulmonary effort is normal. No respiratory distress.     Breath sounds: Normal breath sounds. No wheezing or rales.  Abdominal:     General: Bowel sounds are normal. There is no distension.     Palpations: Abdomen is soft.     Tenderness: There is no  abdominal tenderness.  Musculoskeletal:        General: Normal range of motion.     Cervical back: Normal range of motion and neck supple.  Lymphadenopathy:     Cervical: No cervical adenopathy.  Skin:    General: Skin is warm and dry.  Neurological:     Mental Status: She is alert and oriented to person, place, and time.  Psychiatric:        Behavior: Behavior normal.        Thought Content: Thought content normal.        Judgment: Judgment normal.       Assessment & Plan:   Brittany Conrad was seen today for medical management of chronic issues.  Diagnoses and all orders for this  visit:  Chronic congestive heart failure, unspecified heart failure type (HCC) -     AMB Referral to [facility]  Chronic obstructive pulmonary disease, unspecified COPD type (HCC) -     AMB Referral to [facility]  MDD (major depressive disorder), recurrent, in partial remission (HCC) -     AMB Referral to [facility]  Essential hypertension -     AMB Referral to [facility]  Benzodiazepine dependence, continuous (HCC) -     AMB Referral to [facility]  Chronic pain syndrome -     AMB Referral to [facility]  Need for hepatitis C screening test -     Hepatitis C antibody  Need for vaccination against Streptococcus pneumoniae using pneumococcal conjugate vaccine 13 -     Pneumococcal conjugate vaccine 13-valent  Other orders -     amitriptyline (ELAVIL) 100 MG tablet; Take 2 tablets (200 mg total) by mouth at bedtime. (Needs to be seen before next refill) -     OLANZapine (ZYPREXA) 10 MG tablet; Take 1 tablet (10 mg total) by mouth at bedtime. -     amLODipine (NORVASC) 10 MG tablet; Take 1 tablet (10 mg total) by mouth daily.       I have discontinued Ines Bloomer Potassium Chloride and amLODipine. I have also changed her OLANZapine and amLODipine. Additionally, I am having her maintain her ibuprofen, CVS D3, HYDROcodone-acetaminophen, hydrOXYzine, pantoprazole, albuterol, albuterol, furosemide, busPIRone, gabapentin, Xarelto, Potassium Chloride (KLOR-CON PO), and amitriptyline.  Allergies as of 10/23/2020   No Known Allergies     Medication List       Accurate as of October 23, 2020 11:59 PM. If you have any questions, ask your nurse or doctor.        albuterol (2.5 MG/3ML) 0.083% nebulizer solution Commonly known as: PROVENTIL Take 3 mLs (2.5 mg total) by nebulization every 6 (six) hours as needed for wheezing or shortness of breath.   albuterol 108 (90 Base) MCG/ACT  inhaler Commonly known as: VENTOLIN HFA Inhale 1-2 puffs into the lungs every 6 (six) hours as needed for wheezing or shortness of breath.   amitriptyline 100 MG tablet Commonly known as: ELAVIL Take 2 tablets (200 mg total) by mouth at bedtime. (Needs to be seen before next refill)   amLODipine 10 MG tablet Commonly known as: NORVASC Take 1 tablet (10 mg total) by mouth daily. What changed: Another medication with the same name was removed. Continue taking this medication, and follow the directions you see here. Changed by: Mechele Claude, MD   busPIRone 10 MG tablet Commonly known as: BUSPAR Take 10 mg by mouth 3 (three) times daily. What changed: Another medication with the same name  was removed. Continue taking this medication, and follow the directions you see here. Changed by: Mechele Claude, MD   CVS D3 25 MCG (1000 UT) capsule Generic drug: Cholecalciferol TAKE 1 CAPSULE (1,000 UNITS TOTAL) BY MOUTH DAILY.   furosemide 40 MG tablet Commonly known as: LASIX TAKE 1 TABLET BY MOUTH TWICE A DAY   gabapentin 400 MG capsule Commonly known as: NEURONTIN Take 400 mg by mouth 3 (three) times daily. What changed: Another medication with the same name was removed. Continue taking this medication, and follow the directions you see here. Changed by: Mechele Claude, MD   HYDROcodone-acetaminophen 7.5-325 MG tablet Commonly known as: NORCO Take 1 tablet by mouth every 6 (six) hours as needed for moderate pain.   hydrOXYzine 25 MG capsule Commonly known as: VISTARIL Take 1 capsule (25 mg total) by mouth at bedtime as needed (insomnia).   ibuprofen 800 MG tablet Commonly known as: ADVIL Take 800 mg by mouth 3 (three) times daily as needed.   KLOR-CON PO Take 40 mEq by mouth 3 (three) times daily. What changed: Another medication with the same name was removed. Continue taking this medication, and follow the directions you see here. Changed by: Mechele Claude, MD   OLANZapine 10  MG tablet Commonly known as: ZYPREXA Take 1 tablet (10 mg total) by mouth at bedtime.   pantoprazole 40 MG tablet Commonly known as: PROTONIX TAKE 1 TABLET BY MOUTH TWICE A DAY   Xarelto 10 MG Tabs tablet Generic drug: rivaroxaban        Follow-up: Return in about 3 months (around 01/22/2021).  Mechele Claude, M.D.

## 2020-10-24 ENCOUNTER — Telehealth: Payer: Self-pay | Admitting: *Deleted

## 2020-10-24 NOTE — Chronic Care Management (AMB) (Signed)
  Chronic Care Management   Note  10/24/2020 Name: Brittany Conrad MRN: [id number] DOB: 1951/04/19  [name] is a 70 y.o. year old female who is a primary care patient of Stacks, Cletus Gash, MD. I reached out to [name] by phone today in response to a referral sent by [name] PCP, Dr. Livia Snellen.     [name] was given information about Chronic Care Management services today including:  1. CCM service includes personalized support from designated clinical staff supervised by her physician, including individualized plan of care and coordination with other care providers 2. 24/7 contact phone numbers for assistance for urgent and routine care needs. 3. Service will only be billed when office clinical staff spend 20 minutes or more in a month to coordinate care. 4. Only one practitioner may furnish and bill the service in a calendar month. 5. The patient may stop CCM services at any time (effective at the end of the month) by phone call to the office staff. 6. The patient will be responsible for cost sharing (co-pay) of up to 20% of the service fee (after annual deductible is met).  Patient agreed to services and verbal consent obtained.   Follow up plan: Telephone appointment with care management team member scheduled for:11/03/2020  [facility]

## 2020-10-26 ENCOUNTER — Encounter: Payer: Self-pay | Admitting: Family Medicine

## 2020-10-27 ENCOUNTER — Other Ambulatory Visit: Payer: Self-pay | Admitting: Family Medicine

## 2020-10-27 ENCOUNTER — Telehealth: Payer: Self-pay

## 2020-10-27 MED ORDER — BUSPIRONE HCL 10 MG PO TABS: 10.0000 mg | ORAL_TABLET | Freq: Three times a day (TID) | ORAL | 2 refills | Status: DC

## 2020-10-27 NOTE — Telephone Encounter (Signed)
Pt called stating that she recently had a visit with Dr Darlyn Read and he was able to send in refills for her meds that her psychiatrist normally prescribes to her, but it currently out sick with covid. Says she forgot to request refills on her Buspirone 10mg , 3x/day Rx..   Needs Dr to [facility] in [facility] [place].

## 2020-10-27 NOTE — Telephone Encounter (Signed)
Patient aware.

## 2020-10-27 NOTE — Telephone Encounter (Signed)
Please let the patient know that I sent their prescription to their pharmacy. Thanks, WS 

## 2020-11-03 ENCOUNTER — Ambulatory Visit (INDEPENDENT_AMBULATORY_CARE_PROVIDER_SITE_OTHER): Payer: Medicare HMO | Admitting: *Deleted

## 2020-11-03 DIAGNOSIS — I10 Essential (primary) hypertension: Secondary | ICD-10-CM

## 2020-11-03 DIAGNOSIS — J449 Chronic obstructive pulmonary disease, unspecified: Secondary | ICD-10-CM | POA: Diagnosis not present

## 2020-11-03 NOTE — Patient Instructions (Signed)
Visit Information   PATIENT GOALS:  Goals Addressed            This Visit's Progress   . Prevent Falls and Injury   Not on track    Timeframe:  Long-Range Goal Priority:  Medium Start Date:  11/03/20                           Expected End Date: 11/03/21                       Follow Up Date 12/03/20    . keep my cell phone with me always . use a nonslip pad with throw rugs, or remove them completely . use a cane or walker . Keep a clear path through your home . Move carefully and change positions slowly to avoid falls . Consider shoe modification to improve leg length discrepancy     Why is this important?    Most falls happen when it is hard for you to walk safely. Your balance may be off because of an illness. You may have pain in your knees, hip or other joints.   You may be overly tired or taking medicines that make you sleepy. You may not be able to see or hear clearly.   Falls can lead to broken bones, bruises or other injuries.   There are things you can do to help prevent falling.     Notes:     . Track and Manage My Symptoms-COPD   On track    Timeframe:  Long-Range Goal Priority:  Medium Start Date:  11/03/20                          Expected End Date:                       Follow Up Date 12/03/20   . begin a symptom diary . bring symptom diary to all visits . develop a rescue plan . follow rescue plan if symptoms flare-up . Seek appropriate medical attention for any new or worsening symptoms . Talk with PCP regarding COPD management and possibly getting a maintenance inhaler . eliminate symptom triggers at home . keep follow-up appointments . use an extra pillow to sleep    Why is this important?    Tracking your symptoms and other information about your health helps your doctor plan your care.   Write down the symptoms, the time of day, what you were doing and what medicine you are taking.   You will soon learn how to manage your symptoms.     Notes:         Consent to CCM Services: Ms. Thayne was given information about Chronic Care Management services today including:  1. CCM service includes personalized support from designated clinical staff supervised by her physician, including individualized plan of care and coordination with other care providers 2. 24/7 contact phone numbers for assistance for urgent and routine care needs. 3. Service will only be billed when office clinical staff spend 20 minutes or more in a month to coordinate care. 4. Only one practitioner may furnish and bill the service in a calendar month. 5. The patient may stop CCM services at any time (effective at the end of the month) by phone call to the office staff. 6. The patient will be responsible for cost sharing (co-pay) of  up to 20% of the service fee (after annual deductible is met).  Patient agreed to services and verbal consent obtained.   The patient verbalized understanding of instructions, educational materials, and care plan provided today and declined offer to receive copy of patient instructions, educational materials, and care plan.   Follow Up Plan:  . Telephone follow up appointment with care management team member scheduled for: 12/03/20 with RNCM . The patient has been provided with contact information for the care management team and has been advised to call with any health related questions or concerns.  . Next PCP appointment scheduled for: 01/22/21 with Dr Livia Snellen  Chong Sicilian, BSN, RN-BC Embedded Chronic Care Manager [facility] Care Management Direct Dial: [phone]   CLINICAL CARE PLAN: Patient Care Plan: RNCM: COPD (Adult)    Problem Identified: Symptom Exacerbation (COPD)     Long-Range Goal: Symptom Exacerbation Prevented or Minimized   Start Date: 11/03/2020  This Visit's Progress: On track  Priority: Medium  Note:   Current Barriers:  . Chronic Disease Management support and education needs  related to COPD . Does not drive . Does not have a maintenance inhaler  Nurse Case Manager Clinical Goal(s):  . patient will work with PCP to address needs related to medical management of COPD . patient will meet with RN Care Manager to address Self management of COPD . the patient will demonstrate ongoing self health care management ability as evidenced by identifying and limiting triggers to COPD flare-ups*  Interventions:  . 1:1 collaboration with [name], MD regarding development and update of comprehensive plan of care as evidenced by provider attestation and co-signature . Inter-disciplinary care team collaboration (see longitudinal plan of care) . Chart reviewed including relevant office notes, hospital notes, imaging reports and lab results . Evaluation of current treatment plan related to COPD and patient's adherence to plan as established by provider. . Discussed plans with patient for ongoing care management follow up and provided patient with direct contact information for care management team . Reviewed and discussed medications o Using albuterol nebulizer twice daily on average o Does not have a maintenance inhaler o Has a rescue inhaler for emergencies while out of the house . RNCM will collaborate with PCP to see if a maintenance inhaler is appropriate . Discussed hospitalization in March for [place] much better and is back to baseline o Is not using supplemental oxygen o Did not receive home health PT and OT as ordered at discharge . Discussed family/social support o Sister lives around the corner from her. She visits and talks with her daily. Sister helps with transportation.  o Is not part of any social groups but is content with that . Encouraged an increase in physical activity as tolerated o Limited by COPD, chronic pain, and fear of falling . Discussed hobbies o Enjoys working in her flower garden . Discussed COPD Action Plan and symptom  tracking . Advised to monitor for signs of respiratory infection, including changes in sputum color, volume and thickness, as well as fever. . Encouraged patient to seek appropriate medical attention for new or worsening symptoms . Collaborated with [facility] front office staff to schedule a 3 month f/u with Dr Livia Snellen in July . Provided with RNCM contact number and encouraged to reach out as needed  Self Care Activities:  . Self administers medications as prescribed . Attends all scheduled provider appointments . Calls pharmacy for medication refills . Performs ADL's independently . Calls  provider office for new concerns or questions  Patient Goals Over the next 30 days, patient will: . begin a symptom diary . bring symptom diary to all visits . develop a rescue plan . follow rescue plan if symptoms flare-up . Seek appropriate medical attention for any new or worsening symptoms . Talk with PCP regarding COPD management and possibly getting a maintenance inhaler . eliminate symptom triggers at home . keep follow-up appointments . use an extra pillow to sleep   Follow Up Plan:  . Telephone follow up appointment with care management team member scheduled for: 12/03/20 with RNCM . The patient has been provided with contact information for the care management team and has been advised to call with any health related questions or concerns.  . Next PCP appointment scheduled for: 01/22/21 with Dr Livia Snellen     Task: Identify and Minimize Risk of COPD Exacerbation   Note:   Patient Care Plan: RNCM: Fall Risk (Adult)    Problem Identified: Fall Risk     Long-Range Goal: Absence of Fall and Fall-Related Injury   Start Date: 11/03/2020  This Visit's Progress: Not on track  Priority: Medium  Note:   Current Barriers:  . Care Coordination needs related to fall prevention in a patient with COPD, anemia, HTN, CHF, history of falls . Poor balance. 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 HTN,  COPD, BP1, MDD, CHF, anemia  Nurse Case Manager Clinical Goal(s):  . patient will work with Consulting civil engineer to address needs related to increased fall risk . the patient will demonstrate ongoing self health care management ability as evidenced by avoiding falls*  Interventions:  . 1:1 collaboration with [name], MD regarding development and update of comprehensive plan of care as evidenced by provider attestation and co-signature . Inter-disciplinary care team collaboration (see longitudinal plan of care) . Chart reviewed including relevant office notes and lab results . 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 Encouraged continued use of cane with ambulation . Verbal education on fall prevention strategies provided . Provided verbal information on [place] and exercise programs . Collaborated with front office staff to schedule a 3 month f/u with Dr Livia Snellen in July . Discussed plans with patient for ongoing care management follow up and provided patient with direct contact information for care management team  Self Care Activities:  . Self administers medications as prescribed . Attends all scheduled provider appointments . Calls pharmacy for medication refills . Performs ADL's independently . Calls provider office for new concerns or questions  Patient Goals Over the next 30 days, patient will: . keep my cell phone with me always . use a nonslip pad with throw rugs, or remove them completely . use a cane or walker . Keep a clear path through your home . Move carefully and change positions slowly to avoid falls . Consider shoe modification to improve leg length discrepancy   . Consider exercise programs through [facility]  Follow Up Plan:  . Telephone follow up appointment with care  management team member scheduled for: 12/03/20 with RNCM . The patient has been provided with contact information for the care management team and has been advised to call with any health related questions or concerns.  . Next PCP appointment scheduled for: 01/22/21 with Dr Livia Snellen

## 2020-11-03 NOTE — Chronic Care Management (AMB) (Signed)
Chronic Care Management   CCM RN Visit Note  11/03/2020 Name: Brittany Conrad MRN: [id number] DOB: [birth year]  Subjective: Brittany Conrad is a 70 y.o. year old female who is a primary care patient of Stacks, Cletus Gash, MD. The care management team was consulted for assistance with disease management and care coordination needs.    Engaged with patient by telephone for initial visit in response to provider referral for case management and/or care coordination services.   Consent to Services:  The patient was given the following information about Chronic Care Management services today, agreed to services, and gave verbal consent: 1. CCM service includes personalized support from designated clinical staff supervised by the primary care provider, including individualized plan of care and coordination with other care providers 2. 24/7 contact phone numbers for assistance for urgent and routine care needs. 3. Service will only be billed when office clinical staff spend 20 minutes or more in a month to coordinate care. 4. Only one practitioner may furnish and bill the service in a calendar month. 5.The patient may stop CCM services at any time (effective at the end of the month) by phone call to the office staff. 6. The patient will be responsible for cost sharing (co-pay) of up to 20% of the service fee (after annual deductible is met). Patient agreed to services and consent obtained.  Patient agreed to services and verbal consent obtained.   Assessment: Review of patient past medical history, allergies, medications, health status, including review of consultants reports, laboratory and other test data, was performed as part of comprehensive evaluation and provision of chronic care management services.   SDOH (Social Determinants of Health) assessments and interventions performed:  SDOH Interventions   Flowsheet Row Most Recent Value  SDOH Interventions   Physical Activity Interventions [employer], Patient  Refused  Social Connections Interventions [employer], Patient Refused  Transportation Interventions Other (Comment)  [uses transportation services through [place] but can only get into a car. Can't step up into a van. Has had to cancel if they send a van.]       CCM Care Plan  No Known Allergies  Outpatient Encounter Medications as of 11/03/2020  Medication Sig  . albuterol (PROVENTIL) (2.5 MG/3ML) 0.083% nebulizer solution Take 3 mLs (2.5 mg total) by nebulization every 6 (six) hours as needed for wheezing or shortness of breath.  Marland Kitchen albuterol (VENTOLIN HFA) 108 (90 Base) MCG/ACT inhaler Inhale 1-2 puffs into the lungs every 6 (six) hours as needed for wheezing or shortness of breath.  Marland Kitchen amitriptyline (ELAVIL) 100 MG tablet Take 2 tablets (200 mg total) by mouth at bedtime. (Needs to be seen before next refill)  . amLODipine (NORVASC) 10 MG tablet Take 1 tablet (10 mg total) by mouth daily.  . busPIRone (BUSPAR) 10 MG tablet Take 1 tablet (10 mg total) by mouth 3 (three) times daily.  . [employer] D3 25 MCG (1000 UT) capsule TAKE 1 CAPSULE (1,000 UNITS TOTAL) BY MOUTH DAILY.  . furosemide (LASIX) 40 MG tablet TAKE 1 TABLET BY MOUTH TWICE A DAY  . gabapentin (NEURONTIN) 400 MG capsule Take 400 mg by mouth 3 (three) times daily.  Marland Kitchen HYDROcodone-acetaminophen (NORCO) 7.5-325 MG tablet Take 1 tablet by mouth every 6 (six) hours as needed for moderate pain.  . hydrOXYzine (VISTARIL) 25 MG capsule Take 1 capsule (25 mg total) by mouth at bedtime as needed (insomnia).  Marland Kitchen ibuprofen (ADVIL,MOTRIN) 800 MG tablet Take 800 mg by mouth 3 (three) times daily as needed.   Marland Kitchen  OLANZapine (ZYPREXA) 10 MG tablet Take 1 tablet (10 mg total) by mouth at bedtime.  . pantoprazole (PROTONIX) 40 MG tablet TAKE 1 TABLET BY MOUTH TWICE A DAY  . Potassium Chloride (KLOR-CON PO) Take 40 mEq by mouth 3 (three) times daily.  Alveda Reasons 10 MG TABS tablet    No facility-administered encounter medications on file as of 11/03/2020.     Patient Active Problem List   Diagnosis Date Noted  . Chronic congestive heart failure, unspecified heart failure type ([place]) 10/23/2020  . Essential hypertension 10/23/2020  . Benzodiazepine dependence, continuous ([place]) 10/23/2020  . Chronic pain syndrome 10/23/2020  . MDD (major depressive disorder), recurrent episode, mild ([place]) 09/07/2019  . GAD (generalized anxiety disorder) 03/01/2019  . Normocytic anemia 04/13/2018  . COPD (chronic obstructive pulmonary disease) ([place]) 04/13/2018    Conditions to be addressed/monitored:CHF, HTN, COPD, Depression, Bipolar Disorder and anemia and increased fall risk  Care Plan : RNCM: COPD (Adult)  Updates made by Ilean China, RN since 11/03/2020 12:00 AM    Problem: Symptom Exacerbation (COPD)     Long-Range Goal: Symptom Exacerbation Prevented or Minimized   Start Date: 11/03/2020  This Visit's Progress: On track  Priority: Medium  Note:   Current Barriers:  . Chronic Disease Management support and education needs related to COPD . Does not drive . Does not have a maintenance inhaler  Nurse Case Manager Clinical Goal(s):  . patient will work with PCP to address needs related to medical management of COPD . patient will meet with RN Care Manager to address Self management of COPD . the patient will demonstrate ongoing self health care management ability as evidenced by identifying and limiting triggers to COPD flare-ups*  Interventions:  . 1:1 collaboration with [name], MD regarding development and update of comprehensive plan of care as evidenced by provider attestation and co-signature . Inter-disciplinary care team collaboration (see longitudinal plan of care) . Chart reviewed including relevant office notes, hospital notes, imaging reports and lab results . Evaluation of current treatment plan related to COPD and patient's adherence to plan as established by provider. . Discussed plans with patient for ongoing care  management follow up and provided patient with direct contact information for care management team . Reviewed and discussed medications o Using albuterol nebulizer twice daily on average o Does not have a maintenance inhaler o Has a rescue inhaler for emergencies while out of the house . RNCM will collaborate with PCP to see if a maintenance inhaler is appropriate . Discussed hospitalization in March for [place] much better and is back to baseline o Is not using supplemental oxygen o Did not receive home health PT and OT as ordered at discharge . Discussed family/social support o Sister lives around the corner from her. She visits and talks with her daily. Sister helps with transportation.  o Is not part of any social groups but is content with that . Encouraged an increase in physical activity as tolerated o Limited by COPD, chronic pain, and fear of falling . Discussed hobbies o Enjoys working in her flower garden . Discussed COPD Action Plan and symptom tracking . Advised to monitor for signs of respiratory infection, including changes in sputum color, volume and thickness, as well as fever. . Encouraged patient to seek appropriate medical attention for new or worsening symptoms . Collaborated with [facility] front office staff to schedule a 3 month f/u with Dr Livia Snellen in July . Provided with RNCM contact number and encouraged  to reach out as needed  Self Care Activities:  . Self administers medications as prescribed . Attends all scheduled provider appointments . Calls pharmacy for medication refills . Performs ADL's independently . Calls provider office for new concerns or questions  Patient Goals Over the next 30 days, patient will: . begin a symptom diary . bring symptom diary to all visits . develop a rescue plan . follow rescue plan if symptoms flare-up . Seek appropriate medical attention for any new or worsening symptoms . Talk with PCP regarding COPD management and  possibly getting a maintenance inhaler . eliminate symptom triggers at home . keep follow-up appointments . use an extra pillow to sleep     Care Plan : RNCM: Fall Risk (Adult)  Updates made by Ilean China, RN since 11/03/2020 12:00 AM    Problem: Fall Risk     Long-Range Goal: Absence of Fall and Fall-Related Injury   Start Date: 11/03/2020  This Visit's Progress: Not on track  Priority: Medium  Note:   Current Barriers:  . Care Coordination needs related to fall prevention in a patient with COPD, anemia, HTN, CHF, history of falls . Poor balance. 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 HTN, COPD, BP1, MDD, CHF, anemia  Nurse Case Manager Clinical Goal(s):  . patient will work with Consulting civil engineer to address needs related to increased fall risk . the patient will demonstrate ongoing self health care management ability as evidenced by avoiding falls  Interventions:  . 1:1 collaboration with [name], MD regarding development and update of comprehensive plan of care as evidenced by provider attestation and co-signature . Inter-disciplinary care team collaboration (see longitudinal plan of care) . Chart reviewed including relevant office notes and lab results . 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 Encouraged continued use of cane with ambulation . Verbal education on fall prevention strategies provided . Provided verbal information on [place] and exercise programs . Collaborated with front office staff to schedule a 3 month f/u with Dr Livia Snellen in July . Discussed plans with patient for ongoing care management follow up and provided patient with direct contact information for care management team  Self Care Activities:  . Self administers medications as prescribed . Attends all scheduled provider  appointments . Calls pharmacy for medication refills . Performs ADL's independently . Calls provider office for new concerns or questions  Patient Goals Over the next 30 days, patient will: . keep my cell phone with me always . use a nonslip pad with throw rugs, or remove them completely . use a cane or walker . Keep a clear path through your home . Move carefully and change positions slowly to avoid falls . Consider shoe modification to improve leg length discrepancy   . Consider exercise programs through [facility]   Follow Up Plan:  . Telephone follow up appointment with care management team member scheduled for: 12/03/20 with RNCM . The patient has been provided with contact information for the care management team and has been advised to call with any health related questions or concerns.  . Next PCP appointment scheduled for: 01/22/21 with Dr Livia Snellen  Chong Sicilian, BSN, RN-BC [place] [facility] [place] Management Direct Dial: [phone]

## 2020-11-11 DIAGNOSIS — F319 Bipolar disorder, unspecified: Secondary | ICD-10-CM | POA: Diagnosis not present

## 2020-11-11 DIAGNOSIS — Z79899 Other long term (current) drug therapy: Secondary | ICD-10-CM | POA: Diagnosis not present

## 2020-11-11 DIAGNOSIS — R6889 Other general symptoms and signs: Secondary | ICD-10-CM | POA: Diagnosis not present

## 2020-11-11 DIAGNOSIS — M25561 Pain in right knee: Secondary | ICD-10-CM | POA: Diagnosis not present

## 2020-11-11 DIAGNOSIS — E669 Obesity, unspecified: Secondary | ICD-10-CM | POA: Diagnosis not present

## 2020-11-11 DIAGNOSIS — G609 Hereditary and idiopathic neuropathy, unspecified: Secondary | ICD-10-CM | POA: Diagnosis not present

## 2020-11-13 DIAGNOSIS — R6889 Other general symptoms and signs: Secondary | ICD-10-CM | POA: Diagnosis not present

## 2020-11-13 DIAGNOSIS — F310 Bipolar disorder, current episode hypomanic: Secondary | ICD-10-CM | POA: Diagnosis not present

## 2020-11-13 DIAGNOSIS — F411 Generalized anxiety disorder: Secondary | ICD-10-CM | POA: Diagnosis not present

## 2020-11-13 DIAGNOSIS — Z653 Problems related to other legal circumstances: Secondary | ICD-10-CM | POA: Diagnosis not present

## 2020-11-20 ENCOUNTER — Ambulatory Visit (INDEPENDENT_AMBULATORY_CARE_PROVIDER_SITE_OTHER): Payer: Medicare HMO

## 2020-11-20 ENCOUNTER — Encounter (INDEPENDENT_AMBULATORY_CARE_PROVIDER_SITE_OTHER): Payer: Self-pay | Admitting: *Deleted

## 2020-11-20 VITALS — Ht 62.0 in | Wt 174.0 lb

## 2020-11-20 DIAGNOSIS — Z1231 Encounter for screening mammogram for malignant neoplasm of breast: Secondary | ICD-10-CM

## 2020-11-20 DIAGNOSIS — Z87891 Personal history of nicotine dependence: Secondary | ICD-10-CM | POA: Diagnosis not present

## 2020-11-20 DIAGNOSIS — Z0000 Encounter for general adult medical examination without abnormal findings: Secondary | ICD-10-CM | POA: Diagnosis not present

## 2020-11-20 DIAGNOSIS — Z1211 Encounter for screening for malignant neoplasm of colon: Secondary | ICD-10-CM | POA: Diagnosis not present

## 2020-11-20 NOTE — Progress Notes (Signed)
Subjective:   Brittany Conrad is a 70 y.o. female who presents for Medicare Annual (Subsequent) preventive examination.  Virtual Visit via Telephone Note  I connected with  Brittany Conrad on 11/20/20 at  4:15 PM EDT by telephone and verified that I am speaking with the correct person using two identifiers.  Location: Patient: Home Provider: [facility] Persons participating in the virtual visit: patient/Nurse Health Advisor   I discussed the limitations, risks, security and privacy concerns of performing an evaluation and management service by telephone and the availability of in person appointments. The patient expressed understanding and agreed to proceed.  Interactive audio and video telecommunications were attempted between this nurse and patient, however failed, due to patient having technical difficulties OR patient did not have access to video capability.  We continued and completed visit with audio only.  Some vital signs may be absent or patient reported.   Katlin Ciszewski E Shakthi Scipio, LPN   Review of Systems     Cardiac Risk Factors include: advanced age (>100men, >75 women);obesity (BMI >30kg/m2);sedentary lifestyle;dyslipidemia;hypertension     Objective:    Today's Vitals   11/20/20 1226 11/20/20 1227  Weight: 174 lb (78.9 kg)   Height:  (1.575 m)   PainSc:  8    Body mass index is 31.83 kg/m.  Advanced Directives 11/20/2020 09/04/2019  Does Patient Have a Medical Advance Directive? No No  Would patient like information on creating a medical advance directive? Yes (MAU/Ambulatory/Procedural Areas - Information given) No - Patient declined    Current Medications (verified) Outpatient Encounter Medications as of 11/20/2020  Medication Sig  . albuterol (PROVENTIL) (2.5 MG/3ML) 0.083% nebulizer solution Take 3 mLs (2.5 mg total) by nebulization every 6 (six) hours as needed for wheezing or shortness of breath.  Marland Kitchen albuterol (VENTOLIN HFA) 108 (90 Base) MCG/ACT inhaler Inhale 1-2  puffs into the lungs every 6 (six) hours as needed for wheezing or shortness of breath.  Marland Kitchen amitriptyline (ELAVIL) 100 MG tablet Take 2 tablets (200 mg total) by mouth at bedtime. (Needs to be seen before next refill)  . amLODipine (NORVASC) 10 MG tablet Take 1 tablet (10 mg total) by mouth daily.  . busPIRone (BUSPAR) 10 MG tablet Take 1 tablet (10 mg total) by mouth 3 (three) times daily.  . [facility] D3 25 MCG (1000 UT) capsule TAKE 1 CAPSULE (1,000 UNITS TOTAL) BY MOUTH DAILY.  Marland Kitchen diclofenac Sodium (VOLTAREN) 1 % GEL   . furosemide (LASIX) 40 MG tablet TAKE 1 TABLET BY MOUTH TWICE A DAY  . gabapentin (NEURONTIN) 400 MG capsule Take 400 mg by mouth 3 (three) times daily.  Marland Kitchen HYDROcodone-acetaminophen (NORCO) 7.5-325 MG tablet Take 1 tablet by mouth every 6 (six) hours as needed for moderate pain.  . hydrOXYzine (VISTARIL) 25 MG capsule Take 1 capsule (25 mg total) by mouth at bedtime as needed (insomnia).  Marland Kitchen ibuprofen (ADVIL,MOTRIN) 800 MG tablet Take 800 mg by mouth 3 (three) times daily as needed.   Marland Kitchen KLOR-CON M20 20 MEQ tablet   . OLANZapine (ZYPREXA) 10 MG tablet Take 1 tablet (10 mg total) by mouth at bedtime.  . pantoprazole (PROTONIX) 40 MG tablet TAKE 1 TABLET BY MOUTH TWICE A DAY  . Potassium Chloride (KLOR-CON PO) Take 40 mEq by mouth 3 (three) times daily.  . sucralfate (CARAFATE) 1 g tablet   . XARELTO 10 MG TABS tablet    No facility-administered encounter medications on file as of 11/20/2020.    Allergies (verified) Patient has no known  allergies.   History: Past Medical History:  Diagnosis Date  . Asthma   . Bipolar 1 disorder (HCC)   . COPD (chronic obstructive pulmonary disease) (HCC)   . Emphysema lung (HCC)   . Emphysema of lung (HCC)   . Neuropathy   . Scoliosis    Past Surgical History:  Procedure Laterality Date  . ABDOMINAL HYSTERECTOMY    . BACK SURGERY    . BREAST REDUCTION SURGERY    . GALLBLADDER SURGERY    . GASTRIC BYPASS    . tummy tuck     Family  History  Problem Relation Age of Onset  . Cancer Mother        bone  . Cancer Father        liver, lungs  . Bipolar disorder Daughter   . Alzheimer's disease Maternal Grandmother   . Tuberculosis Maternal Grandfather   . Cancer Paternal Grandmother   . Stroke Paternal Grandfather    Social History   Socioeconomic History  . Marital status: Widowed    Spouse name: Not on file  . Number of children: 2  . Years of education: 44  . Highest education level: 11th grade  Occupational History  . Occupation: Disabled  Tobacco Use  . Smoking status: Former Smoker    Packs/day: 2.00    Years: 35.00    Pack years: 70.00    Types: Cigarettes    Quit date: 07/05/2016    Years since quitting: 4.3  . Smokeless tobacco: Never Used  Vaping Use  . Vaping Use: Never used  Substance and Sexual Activity  . Alcohol use: Yes    Comment: occasional  . Drug use: Never  . Sexual activity: Not Currently  Other Topics Concern  . Not on file  Social History Narrative   Lives alone; sister lives nearby; children live out of town; Her insurance pays for her public transportation - she doesn't drive   Social Determinants of Health   Financial Resource Strain: Low Risk   . Difficulty of Paying Living Expenses: Not very hard  Food Insecurity: No Food Insecurity  . Worried About Programme researcher, broadcasting/film/video in the Last Year: Never true  . Ran Out of Food in the Last Year: Never true  Transportation Needs: No Transportation Needs  . Lack of Transportation (Medical): No  . Lack of Transportation (Non-Medical): No  Physical Activity: Insufficiently Active  . Days of Exercise per Week: 3 days  . Minutes of Exercise per Session: 30 min  Stress: Stress Concern Present  . Feeling of Stress : To some extent  Social Connections: Socially Isolated  . Frequency of Communication with Friends and Family: More than three times a week  . Frequency of Social Gatherings with Friends and Family: More than three times a  week  . Attends Religious Services: Never  . Active Member of Clubs or Organizations: No  . Attends Banker Meetings: Never  . Marital Status: Never married    Tobacco Counseling Counseling given: Not Answered   Clinical Intake:  Pre-visit preparation completed: Yes  Pain : 0-10 Pain Score: 8  Pain Type: Neuropathic pain,Chronic pain Pain Location: Back Pain Orientation: Right,Left Pain Descriptors / Indicators: Aching,Burning,Constant,Sore Pain Onset: More than a month ago Pain Frequency: Constant     BMI - recorded: 31.83 Nutritional Status: BMI > 30  Obese Nutritional Risks: None Diabetes: No  How often do you need to have someone help you when you read instructions, pamphlets, or other  written materials from your doctor or pharmacy?: 1 - Never  Diabetic? no  Interpreter Needed?: No  Information entered by :: Akua Blethen, LPN   Activities of Daily Living In your present state of health, do you have any difficulty performing the following activities: 11/20/2020  Hearing? Y  Vision? N  Difficulty concentrating or making decisions? N  Walking or climbing stairs? Y  Dressing or bathing? N  Doing errands, shopping? N  Preparing Food and eating ? N  Using the Toilet? N  In the past six months, have you accidently leaked urine? N  Do you have problems with loss of bowel control? N  Managing your Medications? N  Managing your Finances? N  Housekeeping or managing your Housekeeping? N  Some recent data might be hidden    Patient Care Team: Mechele Claude, MD as PCP - General (Family Medicine) Ellwood Sayers, MD as Referring Physician (Physical Medicine and Rehabilitation) Gwenith Daily, RN as Case Manager  Indicate any recent Medical Services you may have received from other than [facility] providers in the past year (date may be approximate).     Assessment:   This is a routine wellness examination for Brittany Conrad.  Hearing/Vision screen  Hearing  Screening   125Hz  250Hz  500Hz  1000Hz  2000Hz  3000Hz  4000Hz  6000Hz  8000Hz   Right ear:           Left ear:           Comments: C/o mild hearing loss - declines hearing aids  Vision Screening Comments: Wears eyeglasses - Routine visits with [facility] in - past due for eye exam  Dietary issues and exercise activities discussed: Current Exercise Habits: Home exercise routine, Type of exercise: walking, Time (Minutes): 30, Frequency (Times/Week): 4, Weekly Exercise (Minutes/Week): 120, Intensity: Mild, Exercise limited by: orthopedic condition(s)  Goals Addressed            This Visit's Progress   . Patient Stated       Lose a little weight and get more active    . Prevent Falls and Injury   On track    Timeframe:  Long-Range Goal Priority:  Medium Start Date:  11/03/20                           Expected End Date: 11/03/21                       Follow Up Date 12/03/20    . keep my cell phone with me always . use a nonslip pad with throw rugs, or remove them completely . use a cane or walker . Keep a clear path through your home . Move carefully and change positions slowly to avoid falls . Consider shoe modification to improve leg length discrepancy     Why is this important?    Most falls happen when it is hard for you to walk safely. Your balance may be off because of an illness. You may have pain in your knees, hip or other joints.   You may be overly tired or taking medicines that make you sleepy. You may not be able to see or hear clearly.   Falls can lead to broken bones, bruises or other injuries.   There are things you can do to help prevent falling.     Notes:       Depression Screen PHQ 2/9 Scores 11/20/2020 10/23/2020 04/08/2020 12/26/2019 12/19/2019 12/05/2019 09/28/2019  PHQ -  2 Score 2 3 0 1 1 0 0  PHQ- 9 Score 9 13 - 4 - - -    Fall Risk Fall Risk  11/20/2020 11/03/2020 10/23/2020 04/08/2020 12/26/2019  Falls in the past year? 1 1 0 0 0  Number falls in past yr: 1 1  0 - -  Injury with Fall? 0 0 0 - -  Risk for fall due to : Impaired balance/gait;Orthopedic patient;History of fall(s);Impaired vision;Medication side effect History of fall(s);Impaired balance/gait - - -  Follow up Education provided;Falls prevention discussed Falls evaluation completed;Falls prevention discussed - Falls evaluation completed Falls evaluation completed    FALL RISK PREVENTION PERTAINING TO THE HOME:  Any stairs in or around the home? Yes  If so, are there any without handrails? No  Home free of loose throw rugs in walkways, pet beds, electrical cords, etc? Yes  Adequate lighting in your home to reduce risk of falls? Yes   ASSISTIVE DEVICES UTILIZED TO PREVENT FALLS:  Life alert? No  Use of a cane, walker or w/c? Yes  Grab bars in the bathroom? Yes  Shower chair or bench in shower? No  Elevated toilet seat or a handicapped toilet? No   TIMED UP AND GO:  Was the test performed? No . Telephonic visit  Cognitive Function:     6CIT Screen 11/20/2020 09/04/2019  What Year? 0 points 0 points  What month? 0 points 0 points  What time? 0 points 0 points  Count back from 20 0 points 2 points  Months in reverse 0 points 0 points  Repeat phrase 4 points 2 points  Total Score 4 4    Immunizations Immunization History  Administered Date(s) Administered  . Fluad Quad(high Dose 65+) 04/08/2020  . Influenza, High Dose Seasonal PF 03/31/2018  . Janssen (J&J) SARS-COV-2 Vaccination 12/22/2019  . Pneumococcal Conjugate-13 10/23/2020  . Tdap 01/11/2019    TDAP status: Up to date  Flu Vaccine status: Up to date  Pneumococcal vaccine status: Up to date  Covid-19 vaccine status: Information provided on how to obtain vaccines.   Qualifies for Shingles Vaccine? Yes   Zostavax completed No   Shingrix Completed?: No.    Education has been provided regarding the importance of this vaccine. Patient has been advised to call insurance company to determine out of pocket expense  if they have not yet received this vaccine. Advised may also receive vaccine at local pharmacy or Health Dept. Verbalized acceptance and understanding.  Screening Tests Health Maintenance  Topic Date Due  . Hepatitis C Screening  Never done  . COVID-19 Vaccine (2 - Booster for Janssen series) 02/16/2020  . MAMMOGRAM  10/23/2021 (Originally 01/14/2002)  . DEXA SCAN  10/23/2021 (Originally 01/14/2017)  . COLONOSCOPY (Pts 45-83yrs Insurance coverage will need to be confirmed)  10/23/2021 (Originally 01/14/1997)  . INFLUENZA VACCINE  02/02/2021  . PNA vac Low Risk Adult (2 of 2 - PPSV23) 10/23/2021  . TETANUS/TDAP  01/10/2029  . HPV VACCINES  Aged Out    Health Maintenance  Health Maintenance Due  Topic Date Due  . Hepatitis C Screening  Never done  . COVID-19 Vaccine (2 - Booster for Janssen series) 02/16/2020    Colorectal cancer screening: Referral to GI placed 11/20/20. Pt aware the office will call re: appt.  Mammogram status: Ordered 11/20/20. Pt provided with contact info and advised to call to schedule appt.   Bone Density status: Ordered 11/2020. Pt provided with contact info and advised to call to schedule  appt.  Lung Cancer Screening: (Low Dose CT Chest recommended if Age 57-80 years, 30 pack-year currently smoking OR have quit w/in 15years.) does qualify.   Lung Cancer Screening Referral: 11/20/20  Additional Screening:  Hepatitis C Screening: does qualify; Do this at next visit  Vision Screening: Recommended annual ophthalmology exams for early detection of glaucoma and other disorders of the eye. Is the patient up to date with their annual eye exam?  No  Who is the provider or what is the name of the office in which the patient attends annual eye exams? [facility] If pt is not established with a provider, would they like to be referred to a provider to establish care? No .   Dental Screening: Recommended annual dental exams for proper oral hygiene  Community  Resource Referral / Chronic Care Management: CRR required this visit?  No   CCM required this visit?  No      Plan:     I have personally reviewed and noted the following in the patient's chart:   . Medical and social history . Use of alcohol, tobacco or illicit drugs  . Current medications and supplements including opioid prescriptions.  . Functional ability and status . Nutritional status . Physical activity . Advanced directives . List of other physicians . Hospitalizations, surgeries, and ER visits in previous 12 months . Vitals . Screenings to include cognitive, depression, and falls . Referrals and appointments  In addition, I have reviewed and discussed with patient certain preventive protocols, quality metrics, and best practice recommendations. A written personalized care plan for preventive services as well as general preventive health recommendations were provided to patient.     [place] Constablemy E Conni Knighton, LPN   9/14/78295/19/2022   Nurse Notes: None

## 2020-11-20 NOTE — Patient Instructions (Signed)
Brittany Conrad , Thank you for taking time to come for your Medicare Wellness Visit. I appreciate your ongoing commitment to your health goals. Please review the following plan we discussed and let me know if I can assist you in the future.   Screening recommendations/referrals: Colonoscopy: Done about 10 years ago *repeat due; ordered today Mammogram: Repeat due *ordered today Bone Density: Due *Do at next visit if able Recommended yearly ophthalmology/optometry visit for glaucoma screening and checkup Recommended yearly dental visit for hygiene and checkup  Vaccinations: Influenza vaccine: Done 04/08/2020 - Repeat annually Pneumococcal vaccine: Done 10/23/2020; next dose in 1 year Tdap vaccine: Done 01/11/2019 - Repeat in 10 years Shingles vaccine: Shingrix discussed. Please contact your pharmacy for coverage information.    Covid-19: Done J&J 12/22/2019 - due for booster  Advanced directives: Advance directive discussed with you today. I have provided a copy for you to complete at home and have notarized. Once this is complete please bring a copy in to our office so we can scan it into your chart.  Conditions/risks identified: Aim for 30 minutes of exercise or brisk walking each day, drink 6-8 glasses of water and eat lots of fruits and vegetables.  Next appointment: Follow up in one year for your annual wellness visit    Preventive Care 65 Years and Older, Female Preventive care refers to lifestyle choices and visits with your health care provider that can promote health and wellness. What does preventive care include?  A yearly physical exam. This is also called an annual well check.  Dental exams once or twice a year.  Routine eye exams. Ask your health care provider how often you should have your eyes checked.  Personal lifestyle choices, including:  Daily care of your teeth and gums.  Regular physical activity.  Eating a healthy diet.  Avoiding tobacco and drug  use.  Limiting alcohol use.  Practicing safe sex.  Taking low-dose aspirin every day.  Taking vitamin and mineral supplements as recommended by your health care provider. What happens during an annual well check? The services and screenings done by your health care provider during your annual well check will depend on your age, overall health, lifestyle risk factors, and family history of disease. Counseling  Your health care provider may ask you questions about your:  Alcohol use.  Tobacco use.  Drug use.  Emotional well-being.  Home and relationship well-being.  Sexual activity.  Eating habits.  History of falls.  Memory and ability to understand (cognition).  Work and work Astronomer.  Reproductive health. Screening  You may have the following tests or measurements:  Height, weight, and BMI.  Blood pressure.  Lipid and cholesterol levels. These may be checked every 5 years, or more frequently if you are over 22 years old.  Skin check.  Lung cancer screening. You may have this screening every year starting at age 83 if you have a 30-pack-year history of smoking and currently smoke or have quit within the past 15 years.  Fecal occult blood test (FOBT) of the stool. You may have this test every year starting at age 88.  Flexible sigmoidoscopy or colonoscopy. You may have a sigmoidoscopy every 5 years or a colonoscopy every 10 years starting at age 109.  Hepatitis C blood test.  Hepatitis B blood test.  Sexually transmitted disease (STD) testing.  Diabetes screening. This is done by checking your blood sugar (glucose) after you have not eaten for a while (fasting). You may have this done  every 1-3 years.  Bone density scan. This is done to screen for osteoporosis. You may have this done starting at age 39.  Mammogram. This may be done every 1-2 years. Talk to your health care provider about how often you should have regular mammograms. Talk with your  health care provider about your test results, treatment options, and if necessary, the need for more tests. Vaccines  Your health care provider may recommend certain vaccines, such as:  Influenza vaccine. This is recommended every year.  Tetanus, diphtheria, and acellular pertussis (Tdap, Td) vaccine. You may need a Td booster every 10 years.  Zoster vaccine. You may need this after age 33.  Pneumococcal 13-valent conjugate (PCV13) vaccine. One dose is recommended after age 53.  Pneumococcal polysaccharide (PPSV23) vaccine. One dose is recommended after age 34. Talk to your health care provider about which screenings and vaccines you need and how often you need them. This information is not intended to replace advice given to you by your health care provider. Make sure you discuss any questions you have with your health care provider. Document Released: 07/18/2015 Document Revised: 03/10/2016 Document Reviewed: 04/22/2015 Elsevier Interactive Patient Education  2017 [employer] Prevention in the Home Falls can cause injuries. They can happen to people of all ages. There are many things you can do to make your home safe and to help prevent falls. What can I do on the outside of my home?  Regularly fix the edges of walkways and driveways and fix any cracks.  Remove anything that might make you trip as you walk through a door, such as a raised step or threshold.  Trim any bushes or trees on the path to your home.  Use bright outdoor lighting.  Clear any walking paths of anything that might make someone trip, such as rocks or tools.  Regularly check to see if handrails are loose or broken. Make sure that both sides of any steps have handrails.  Any raised decks and porches should have guardrails on the edges.  Have any leaves, snow, or ice cleared regularly.  Use sand or salt on walking paths during winter.  Clean up any spills in your garage right away. This includes oil  or grease spills. What can I do in the bathroom?  Use night lights.  Install grab bars by the toilet and in the tub and shower. Do not use towel bars as grab bars.  Use non-skid mats or decals in the tub or shower.  If you need to sit down in the shower, use a plastic, non-slip stool.  Keep the floor dry. Clean up any water that spills on the floor as soon as it happens.  Remove soap buildup in the tub or shower regularly.  Attach bath mats securely with double-sided non-slip rug tape.  Do not have throw rugs and other things on the floor that can make you trip. What can I do in the bedroom?  Use night lights.  Make sure that you have a light by your bed that is easy to reach.  Do not use any sheets or blankets that are too big for your bed. They should not hang down onto the floor.  Have a firm chair that has side arms. You can use this for support while you get dressed.  Do not have throw rugs and other things on the floor that can make you trip. What can I do in the kitchen?  Clean up any spills right  away.  Avoid walking on wet floors.  Keep items that you use a lot in easy-to-reach places.  If you need to reach something above you, use a strong step stool that has a grab bar.  Keep electrical cords out of the way.  Do not use floor polish or wax that makes floors slippery. If you must use wax, use non-skid floor wax.  Do not have throw rugs and other things on the floor that can make you trip. What can I do with my stairs?  Do not leave any items on the stairs.  Make sure that there are handrails on both sides of the stairs and use them. Fix handrails that are broken or loose. Make sure that handrails are as long as the stairways.  Check any carpeting to make sure that it is firmly attached to the stairs. Fix any carpet that is loose or worn.  Avoid having throw rugs at the top or bottom of the stairs. If you do have throw rugs, attach them to the floor with  carpet tape.  Make sure that you have a light switch at the top of the stairs and the bottom of the stairs. If you do not have them, ask someone to add them for you. What else can I do to help prevent falls?  Wear shoes that:  Do not have high heels.  Have rubber bottoms.  Are comfortable and fit you well.  Are closed at the toe. Do not wear sandals.  If you use a stepladder:  Make sure that it is fully opened. Do not climb a closed stepladder.  Make sure that both sides of the stepladder are locked into place.  Ask someone to hold it for you, if possible.  Clearly mark and make sure that you can see:  Any grab bars or handrails.  First and last steps.  Where the edge of each step is.  Use tools that help you move around (mobility aids) if they are needed. These include:  Canes.  Walkers.  Scooters.  Crutches.  Turn on the lights when you go into a dark area. Replace any light bulbs as soon as they burn out.  Set up your furniture so you have a clear path. Avoid moving your furniture around.  If any of your floors are uneven, fix them.  If there are any pets around you, be aware of where they are.  Review your medicines with your doctor. Some medicines can make you feel dizzy. This can increase your chance of falling. Ask your doctor what other things that you can do to help prevent falls. This information is not intended to replace advice given to you by your health care provider. Make sure you discuss any questions you have with your health care provider. Document Released: 04/17/2009 Document Revised: 11/27/2015 Document Reviewed: 07/26/2014 Elsevier Interactive Patient Education  2017 [employer].

## 2020-11-25 ENCOUNTER — Encounter (HOSPITAL_COMMUNITY): Payer: Self-pay

## 2020-11-25 ENCOUNTER — Other Ambulatory Visit (HOSPITAL_COMMUNITY): Payer: Self-pay

## 2020-11-25 DIAGNOSIS — Z87891 Personal history of nicotine dependence: Secondary | ICD-10-CM

## 2020-11-25 DIAGNOSIS — Z122 Encounter for screening for malignant neoplasm of respiratory organs: Secondary | ICD-10-CM

## 2020-11-25 NOTE — Progress Notes (Signed)
Received referral for initial lung cancer screening scan. Contacted patient and obtained smoking history (started age 70 until the age of 57 smoking 1.5PPD, former smoker who quit smoking 8 years ago, 55.5 pack year) as well as answering questions related to the screening process. Patient denies signs/symptoms of lung cancer such as weight loss or hemoptysis. Patient denies comorbidity that would prevent curative treatment if lung cancer were to be found. Patient to be scheduled for [facility] and LDCT.

## 2020-11-25 NOTE — Progress Notes (Signed)
LDCT order placed.

## 2020-11-29 DIAGNOSIS — R079 Chest pain, unspecified: Secondary | ICD-10-CM | POA: Diagnosis not present

## 2020-11-29 DIAGNOSIS — I10 Essential (primary) hypertension: Secondary | ICD-10-CM | POA: Diagnosis not present

## 2020-11-29 DIAGNOSIS — A419 Sepsis, unspecified organism: Secondary | ICD-10-CM | POA: Insufficient documentation

## 2020-11-29 DIAGNOSIS — S2243XA Multiple fractures of ribs, bilateral, initial encounter for closed fracture: Secondary | ICD-10-CM | POA: Diagnosis not present

## 2020-11-29 DIAGNOSIS — S199XXA Unspecified injury of neck, initial encounter: Secondary | ICD-10-CM | POA: Diagnosis not present

## 2020-11-29 DIAGNOSIS — K5790 Diverticulosis of intestine, part unspecified, without perforation or abscess without bleeding: Secondary | ICD-10-CM | POA: Diagnosis not present

## 2020-11-29 DIAGNOSIS — M549 Dorsalgia, unspecified: Secondary | ICD-10-CM | POA: Diagnosis not present

## 2020-11-29 DIAGNOSIS — F419 Anxiety disorder, unspecified: Secondary | ICD-10-CM | POA: Diagnosis not present

## 2020-11-29 DIAGNOSIS — R000 Tachycardia, unspecified: Secondary | ICD-10-CM | POA: Diagnosis not present

## 2020-11-29 DIAGNOSIS — M47812 Spondylosis without myelopathy or radiculopathy, cervical region: Secondary | ICD-10-CM | POA: Diagnosis not present

## 2020-11-29 DIAGNOSIS — J9601 Acute respiratory failure with hypoxia: Secondary | ICD-10-CM | POA: Diagnosis not present

## 2020-11-29 DIAGNOSIS — J9600 Acute respiratory failure, unspecified whether with hypoxia or hypercapnia: Secondary | ICD-10-CM | POA: Diagnosis not present

## 2020-11-29 DIAGNOSIS — S0990XA Unspecified injury of head, initial encounter: Secondary | ICD-10-CM | POA: Diagnosis not present

## 2020-11-29 DIAGNOSIS — R0689 Other abnormalities of breathing: Secondary | ICD-10-CM | POA: Diagnosis not present

## 2020-11-29 DIAGNOSIS — F32A Depression, unspecified: Secondary | ICD-10-CM | POA: Diagnosis not present

## 2020-11-29 DIAGNOSIS — M47816 Spondylosis without myelopathy or radiculopathy, lumbar region: Secondary | ICD-10-CM | POA: Diagnosis not present

## 2020-11-29 DIAGNOSIS — M47814 Spondylosis without myelopathy or radiculopathy, thoracic region: Secondary | ICD-10-CM | POA: Diagnosis not present

## 2020-11-29 DIAGNOSIS — Z79899 Other long term (current) drug therapy: Secondary | ICD-10-CM | POA: Diagnosis not present

## 2020-11-29 DIAGNOSIS — M50223 Other cervical disc displacement at C6-C7 level: Secondary | ICD-10-CM | POA: Diagnosis not present

## 2020-11-29 DIAGNOSIS — D72829 Elevated white blood cell count, unspecified: Secondary | ICD-10-CM | POA: Insufficient documentation

## 2020-11-29 DIAGNOSIS — Z7952 Long term (current) use of systemic steroids: Secondary | ICD-10-CM | POA: Diagnosis not present

## 2020-11-29 DIAGNOSIS — Z6833 Body mass index (BMI) 33.0-33.9, adult: Secondary | ICD-10-CM | POA: Diagnosis not present

## 2020-11-29 DIAGNOSIS — R6520 Severe sepsis without septic shock: Secondary | ICD-10-CM | POA: Diagnosis not present

## 2020-11-29 DIAGNOSIS — M50222 Other cervical disc displacement at C5-C6 level: Secondary | ICD-10-CM | POA: Diagnosis not present

## 2020-11-29 DIAGNOSIS — D649 Anemia, unspecified: Secondary | ICD-10-CM | POA: Diagnosis not present

## 2020-11-29 DIAGNOSIS — J441 Chronic obstructive pulmonary disease with (acute) exacerbation: Secondary | ICD-10-CM | POA: Diagnosis not present

## 2020-11-29 DIAGNOSIS — E669 Obesity, unspecified: Secondary | ICD-10-CM | POA: Diagnosis not present

## 2020-11-29 DIAGNOSIS — J9690 Respiratory failure, unspecified, unspecified whether with hypoxia or hypercapnia: Secondary | ICD-10-CM | POA: Diagnosis not present

## 2020-11-29 DIAGNOSIS — S2231XA Fracture of one rib, right side, initial encounter for closed fracture: Secondary | ICD-10-CM | POA: Diagnosis not present

## 2020-11-29 DIAGNOSIS — G8929 Other chronic pain: Secondary | ICD-10-CM | POA: Diagnosis not present

## 2020-11-29 DIAGNOSIS — I471 Supraventricular tachycardia: Secondary | ICD-10-CM | POA: Diagnosis not present

## 2020-11-29 DIAGNOSIS — Z20822 Contact with and (suspected) exposure to covid-19: Secondary | ICD-10-CM | POA: Diagnosis not present

## 2020-11-29 DIAGNOSIS — R0600 Dyspnea, unspecified: Secondary | ICD-10-CM | POA: Diagnosis not present

## 2020-11-29 DIAGNOSIS — R509 Fever, unspecified: Secondary | ICD-10-CM | POA: Diagnosis not present

## 2020-11-29 DIAGNOSIS — R0602 Shortness of breath: Secondary | ICD-10-CM | POA: Diagnosis not present

## 2020-11-29 DIAGNOSIS — Z87891 Personal history of nicotine dependence: Secondary | ICD-10-CM | POA: Diagnosis not present

## 2020-11-29 DIAGNOSIS — Z9981 Dependence on supplemental oxygen: Secondary | ICD-10-CM | POA: Diagnosis not present

## 2020-11-29 DIAGNOSIS — M4316 Spondylolisthesis, lumbar region: Secondary | ICD-10-CM | POA: Diagnosis not present

## 2020-11-29 DIAGNOSIS — J440 Chronic obstructive pulmonary disease with acute lower respiratory infection: Secondary | ICD-10-CM | POA: Diagnosis not present

## 2020-11-29 DIAGNOSIS — R0902 Hypoxemia: Secondary | ICD-10-CM | POA: Diagnosis not present

## 2020-11-29 DIAGNOSIS — D638 Anemia in other chronic diseases classified elsewhere: Secondary | ICD-10-CM | POA: Diagnosis not present

## 2020-11-29 DIAGNOSIS — J189 Pneumonia, unspecified organism: Secondary | ICD-10-CM | POA: Insufficient documentation

## 2020-11-29 DIAGNOSIS — I959 Hypotension, unspecified: Secondary | ICD-10-CM | POA: Diagnosis not present

## 2020-11-29 DIAGNOSIS — Z792 Long term (current) use of antibiotics: Secondary | ICD-10-CM | POA: Diagnosis not present

## 2020-11-29 DIAGNOSIS — R531 Weakness: Secondary | ICD-10-CM | POA: Diagnosis not present

## 2020-11-29 DIAGNOSIS — D539 Nutritional anemia, unspecified: Secondary | ICD-10-CM | POA: Diagnosis not present

## 2020-11-30 DIAGNOSIS — I959 Hypotension, unspecified: Secondary | ICD-10-CM | POA: Diagnosis not present

## 2020-11-30 DIAGNOSIS — G8929 Other chronic pain: Secondary | ICD-10-CM | POA: Diagnosis not present

## 2020-11-30 DIAGNOSIS — F419 Anxiety disorder, unspecified: Secondary | ICD-10-CM | POA: Diagnosis not present

## 2020-11-30 DIAGNOSIS — I10 Essential (primary) hypertension: Secondary | ICD-10-CM | POA: Diagnosis not present

## 2020-11-30 DIAGNOSIS — J9601 Acute respiratory failure with hypoxia: Secondary | ICD-10-CM | POA: Diagnosis not present

## 2020-11-30 DIAGNOSIS — J441 Chronic obstructive pulmonary disease with (acute) exacerbation: Secondary | ICD-10-CM | POA: Diagnosis not present

## 2020-11-30 DIAGNOSIS — M549 Dorsalgia, unspecified: Secondary | ICD-10-CM | POA: Diagnosis not present

## 2020-11-30 DIAGNOSIS — A419 Sepsis, unspecified organism: Secondary | ICD-10-CM | POA: Diagnosis not present

## 2020-11-30 DIAGNOSIS — J189 Pneumonia, unspecified organism: Secondary | ICD-10-CM | POA: Diagnosis not present

## 2020-12-01 DIAGNOSIS — E669 Obesity, unspecified: Secondary | ICD-10-CM | POA: Diagnosis not present

## 2020-12-01 DIAGNOSIS — I959 Hypotension, unspecified: Secondary | ICD-10-CM | POA: Diagnosis not present

## 2020-12-01 DIAGNOSIS — J189 Pneumonia, unspecified organism: Secondary | ICD-10-CM | POA: Diagnosis not present

## 2020-12-01 DIAGNOSIS — A419 Sepsis, unspecified organism: Secondary | ICD-10-CM | POA: Diagnosis not present

## 2020-12-01 DIAGNOSIS — J9601 Acute respiratory failure with hypoxia: Secondary | ICD-10-CM | POA: Diagnosis not present

## 2020-12-01 DIAGNOSIS — I10 Essential (primary) hypertension: Secondary | ICD-10-CM | POA: Diagnosis not present

## 2020-12-01 DIAGNOSIS — F419 Anxiety disorder, unspecified: Secondary | ICD-10-CM | POA: Diagnosis not present

## 2020-12-01 DIAGNOSIS — R531 Weakness: Secondary | ICD-10-CM | POA: Diagnosis not present

## 2020-12-01 DIAGNOSIS — J441 Chronic obstructive pulmonary disease with (acute) exacerbation: Secondary | ICD-10-CM | POA: Diagnosis not present

## 2020-12-02 DIAGNOSIS — Z9981 Dependence on supplemental oxygen: Secondary | ICD-10-CM | POA: Diagnosis not present

## 2020-12-02 DIAGNOSIS — F419 Anxiety disorder, unspecified: Secondary | ICD-10-CM | POA: Diagnosis not present

## 2020-12-02 DIAGNOSIS — A419 Sepsis, unspecified organism: Secondary | ICD-10-CM | POA: Diagnosis not present

## 2020-12-02 DIAGNOSIS — I10 Essential (primary) hypertension: Secondary | ICD-10-CM | POA: Diagnosis not present

## 2020-12-02 DIAGNOSIS — J441 Chronic obstructive pulmonary disease with (acute) exacerbation: Secondary | ICD-10-CM | POA: Diagnosis not present

## 2020-12-02 DIAGNOSIS — M549 Dorsalgia, unspecified: Secondary | ICD-10-CM | POA: Diagnosis not present

## 2020-12-02 DIAGNOSIS — J189 Pneumonia, unspecified organism: Secondary | ICD-10-CM | POA: Diagnosis not present

## 2020-12-02 DIAGNOSIS — F32A Depression, unspecified: Secondary | ICD-10-CM | POA: Diagnosis not present

## 2020-12-02 DIAGNOSIS — G8929 Other chronic pain: Secondary | ICD-10-CM | POA: Diagnosis not present

## 2020-12-03 ENCOUNTER — Telehealth: Payer: Self-pay | Admitting: *Deleted

## 2020-12-03 ENCOUNTER — Telehealth: Payer: Medicare HMO | Admitting: *Deleted

## 2020-12-03 DIAGNOSIS — J441 Chronic obstructive pulmonary disease with (acute) exacerbation: Secondary | ICD-10-CM | POA: Diagnosis not present

## 2020-12-03 DIAGNOSIS — F32A Depression, unspecified: Secondary | ICD-10-CM | POA: Diagnosis not present

## 2020-12-03 DIAGNOSIS — G8929 Other chronic pain: Secondary | ICD-10-CM | POA: Diagnosis not present

## 2020-12-03 DIAGNOSIS — M549 Dorsalgia, unspecified: Secondary | ICD-10-CM | POA: Diagnosis not present

## 2020-12-03 DIAGNOSIS — A419 Sepsis, unspecified organism: Secondary | ICD-10-CM | POA: Diagnosis not present

## 2020-12-03 DIAGNOSIS — F419 Anxiety disorder, unspecified: Secondary | ICD-10-CM | POA: Diagnosis not present

## 2020-12-03 DIAGNOSIS — J189 Pneumonia, unspecified organism: Secondary | ICD-10-CM | POA: Diagnosis not present

## 2020-12-03 DIAGNOSIS — R531 Weakness: Secondary | ICD-10-CM | POA: Diagnosis not present

## 2020-12-03 DIAGNOSIS — I10 Essential (primary) hypertension: Secondary | ICD-10-CM | POA: Diagnosis not present

## 2020-12-03 NOTE — Telephone Encounter (Signed)
  Care Management   Follow Up Note   12/03/2020 Name: Brittany Conrad MRN: [id number] DOB: Jul 19, 1950   Referred by: Mechele Claude, MD Reason for referral : Chronic Care Management (Unsuccessful outreach attempt)   An unsuccessful telephone outreach was attempted today. The patient was referred to the case management team for assistance with care management and care coordination. Patient is currently hospitalized at [facility].   Follow Up Plan:  Forwarding to CCM Care Guides to assist in rescheduling telephone follow-up.  Demetrios Loll, BSN, RN-BC Embedded Chronic Care Manager [facility] Care Management Direct Dial: [phone]

## 2020-12-04 DIAGNOSIS — F419 Anxiety disorder, unspecified: Secondary | ICD-10-CM | POA: Diagnosis not present

## 2020-12-04 DIAGNOSIS — M549 Dorsalgia, unspecified: Secondary | ICD-10-CM | POA: Diagnosis not present

## 2020-12-04 DIAGNOSIS — F32A Depression, unspecified: Secondary | ICD-10-CM | POA: Diagnosis not present

## 2020-12-04 DIAGNOSIS — J189 Pneumonia, unspecified organism: Secondary | ICD-10-CM | POA: Diagnosis not present

## 2020-12-04 DIAGNOSIS — J441 Chronic obstructive pulmonary disease with (acute) exacerbation: Secondary | ICD-10-CM | POA: Diagnosis not present

## 2020-12-04 DIAGNOSIS — I10 Essential (primary) hypertension: Secondary | ICD-10-CM | POA: Diagnosis not present

## 2020-12-04 DIAGNOSIS — A419 Sepsis, unspecified organism: Secondary | ICD-10-CM | POA: Diagnosis not present

## 2020-12-04 DIAGNOSIS — R0602 Shortness of breath: Secondary | ICD-10-CM | POA: Diagnosis not present

## 2020-12-04 DIAGNOSIS — G8929 Other chronic pain: Secondary | ICD-10-CM | POA: Diagnosis not present

## 2020-12-04 DIAGNOSIS — R531 Weakness: Secondary | ICD-10-CM | POA: Diagnosis not present

## 2020-12-04 DIAGNOSIS — S2243XA Multiple fractures of ribs, bilateral, initial encounter for closed fracture: Secondary | ICD-10-CM | POA: Diagnosis not present

## 2020-12-05 ENCOUNTER — Telehealth: Payer: Self-pay

## 2020-12-05 DIAGNOSIS — Z9981 Dependence on supplemental oxygen: Secondary | ICD-10-CM | POA: Diagnosis not present

## 2020-12-05 DIAGNOSIS — J9601 Acute respiratory failure with hypoxia: Secondary | ICD-10-CM | POA: Diagnosis not present

## 2020-12-05 DIAGNOSIS — D638 Anemia in other chronic diseases classified elsewhere: Secondary | ICD-10-CM | POA: Diagnosis not present

## 2020-12-05 DIAGNOSIS — J441 Chronic obstructive pulmonary disease with (acute) exacerbation: Secondary | ICD-10-CM | POA: Diagnosis not present

## 2020-12-05 DIAGNOSIS — Z7952 Long term (current) use of systemic steroids: Secondary | ICD-10-CM | POA: Diagnosis not present

## 2020-12-05 DIAGNOSIS — Z79899 Other long term (current) drug therapy: Secondary | ICD-10-CM | POA: Diagnosis not present

## 2020-12-05 DIAGNOSIS — Z792 Long term (current) use of antibiotics: Secondary | ICD-10-CM | POA: Diagnosis not present

## 2020-12-05 DIAGNOSIS — J189 Pneumonia, unspecified organism: Secondary | ICD-10-CM | POA: Diagnosis not present

## 2020-12-05 NOTE — Chronic Care Management (AMB) (Signed)
  Care Management   Note  12/05/2020 Name: Brittany Conrad MRN: [id number] DOB: 06-Sep-1950  Brittany Conrad is a 70 y.o. year old female who is a primary care patient of Stacks, Broadus John, MD and is actively engaged with the care management team. I reached out to Brittany Conrad by phone today to assist with re-scheduling a follow up visit with the RN Case Manager  Follow up plan: Unsuccessful telephone outreach attempt made. A HIPAA compliant phone message was left for the patient providing contact information and requesting a return call.  The care management team will reach out to the patient again over the next 7 days.  If patient returns call to provider office, please advise to call Embedded Care Management [name]  at [phone]  Brittany Conrad, Brittany Conrad, Embedded Care Coordination [facility]  [place], [place] [address] Direct Dial: [phone] [email or web address]

## 2020-12-06 DIAGNOSIS — F32A Depression, unspecified: Secondary | ICD-10-CM | POA: Diagnosis not present

## 2020-12-06 DIAGNOSIS — A419 Sepsis, unspecified organism: Secondary | ICD-10-CM | POA: Diagnosis not present

## 2020-12-06 DIAGNOSIS — J9600 Acute respiratory failure, unspecified whether with hypoxia or hypercapnia: Secondary | ICD-10-CM | POA: Diagnosis not present

## 2020-12-06 DIAGNOSIS — J441 Chronic obstructive pulmonary disease with (acute) exacerbation: Secondary | ICD-10-CM | POA: Diagnosis not present

## 2020-12-06 DIAGNOSIS — J189 Pneumonia, unspecified organism: Secondary | ICD-10-CM | POA: Diagnosis not present

## 2020-12-06 DIAGNOSIS — D649 Anemia, unspecified: Secondary | ICD-10-CM | POA: Diagnosis not present

## 2020-12-07 DIAGNOSIS — R531 Weakness: Secondary | ICD-10-CM | POA: Diagnosis not present

## 2020-12-07 DIAGNOSIS — Z20822 Contact with and (suspected) exposure to covid-19: Secondary | ICD-10-CM | POA: Diagnosis not present

## 2020-12-07 DIAGNOSIS — Z87891 Personal history of nicotine dependence: Secondary | ICD-10-CM | POA: Diagnosis not present

## 2020-12-07 DIAGNOSIS — J189 Pneumonia, unspecified organism: Secondary | ICD-10-CM | POA: Diagnosis not present

## 2020-12-07 DIAGNOSIS — Z79899 Other long term (current) drug therapy: Secondary | ICD-10-CM | POA: Diagnosis not present

## 2020-12-07 DIAGNOSIS — Z885 Allergy status to narcotic agent status: Secondary | ICD-10-CM | POA: Diagnosis not present

## 2020-12-07 DIAGNOSIS — S2231XA Fracture of one rib, right side, initial encounter for closed fracture: Secondary | ICD-10-CM | POA: Diagnosis not present

## 2020-12-07 DIAGNOSIS — R0602 Shortness of breath: Secondary | ICD-10-CM | POA: Diagnosis not present

## 2020-12-07 DIAGNOSIS — R059 Cough, unspecified: Secondary | ICD-10-CM | POA: Diagnosis not present

## 2020-12-07 DIAGNOSIS — J449 Chronic obstructive pulmonary disease, unspecified: Secondary | ICD-10-CM | POA: Diagnosis not present

## 2020-12-07 DIAGNOSIS — R0902 Hypoxemia: Secondary | ICD-10-CM | POA: Diagnosis not present

## 2020-12-07 DIAGNOSIS — I959 Hypotension, unspecified: Secondary | ICD-10-CM | POA: Diagnosis not present

## 2020-12-09 ENCOUNTER — Telehealth: Payer: Self-pay

## 2020-12-09 NOTE — Telephone Encounter (Signed)
Transition Care Management Follow-up Telephone Call  Date of discharge and from where: 12/06/20 [facility]  Diagnosis: pneumonia  How have you been since you were released from the hospital? Not good - says she is too weak and achy to get up and move around - it takes her too long to get up, so she has been incontinent with urine and stools. She has no help - she needs home health; refuses to go to a nursing home - thinks home health can help her get back to baseline  Any questions or concerns? Yes - as noted above  Items Reviewed:  Did the pt receive and understand the discharge instructions provided? Yes   Medications obtained and verified? Yes   Other? No   Any new allergies since your discharge? No   Dietary orders reviewed? Yes  Do you have support at home? No   Home Care and Equipment/Supplies: Were home health services ordered? No - she really wants home health if she qualifies - made appt for tomorrow to discuss Were any new equipment or medical supplies ordered?  No - she says she needs a walker, but only has a cane   Functional Questionnaire: (I = Independent and D = Dependent) ADLs: D - says she is barely managing on her own - needs assistance  Bathing/Dressing- D  Meal Prep- I  Eating- I  Maintaining continence- D  Transferring/Ambulation- D  Managing Meds- I  Follow up appointments reviewed:   PCP Hospital f/u appt confirmed? Yes  Scheduled to see Onyeje on 6/8 @ 8 (televisit as patient is unable to get out at this time and doesn't drive)  Specialist Hospital f/u appt confirmed? No    Are transportation arrangements needed? Yes  - she cannot drive - uses [facility] on Fridays to get to pain management prn, but she feels too weak to get out at this time - wants phone visit   If their condition worsens, is the pt aware to call PCP or go to the Emergency Dept.? Yes  Was the patient provided with contact information for the PCP's office or ED? Yes  Was to pt  encouraged to call back with questions or concerns? Yes

## 2020-12-10 ENCOUNTER — Ambulatory Visit (INDEPENDENT_AMBULATORY_CARE_PROVIDER_SITE_OTHER): Payer: Medicare HMO | Admitting: Nurse Practitioner

## 2020-12-10 ENCOUNTER — Encounter: Payer: Self-pay | Admitting: Nurse Practitioner

## 2020-12-10 DIAGNOSIS — Z9049 Acquired absence of other specified parts of digestive tract: Secondary | ICD-10-CM | POA: Diagnosis not present

## 2020-12-10 DIAGNOSIS — R531 Weakness: Secondary | ICD-10-CM | POA: Diagnosis not present

## 2020-12-10 DIAGNOSIS — Z885 Allergy status to narcotic agent status: Secondary | ICD-10-CM | POA: Diagnosis not present

## 2020-12-10 DIAGNOSIS — Z09 Encounter for follow-up examination after completed treatment for conditions other than malignant neoplasm: Secondary | ICD-10-CM | POA: Insufficient documentation

## 2020-12-10 DIAGNOSIS — F32A Depression, unspecified: Secondary | ICD-10-CM | POA: Diagnosis not present

## 2020-12-10 DIAGNOSIS — J189 Pneumonia, unspecified organism: Secondary | ICD-10-CM

## 2020-12-10 DIAGNOSIS — K219 Gastro-esophageal reflux disease without esophagitis: Secondary | ICD-10-CM | POA: Diagnosis not present

## 2020-12-10 DIAGNOSIS — R918 Other nonspecific abnormal finding of lung field: Secondary | ICD-10-CM | POA: Diagnosis not present

## 2020-12-10 DIAGNOSIS — R0902 Hypoxemia: Secondary | ICD-10-CM | POA: Diagnosis not present

## 2020-12-10 DIAGNOSIS — Z79899 Other long term (current) drug therapy: Secondary | ICD-10-CM | POA: Diagnosis not present

## 2020-12-10 DIAGNOSIS — U071 COVID-19: Secondary | ICD-10-CM | POA: Diagnosis not present

## 2020-12-10 DIAGNOSIS — R059 Cough, unspecified: Secondary | ICD-10-CM | POA: Diagnosis not present

## 2020-12-10 DIAGNOSIS — S2231XA Fracture of one rib, right side, initial encounter for closed fracture: Secondary | ICD-10-CM | POA: Diagnosis not present

## 2020-12-10 DIAGNOSIS — R5381 Other malaise: Secondary | ICD-10-CM | POA: Diagnosis not present

## 2020-12-10 DIAGNOSIS — J449 Chronic obstructive pulmonary disease, unspecified: Secondary | ICD-10-CM | POA: Diagnosis not present

## 2020-12-10 DIAGNOSIS — U099 Post covid-19 condition, unspecified: Secondary | ICD-10-CM | POA: Diagnosis not present

## 2020-12-10 NOTE — Patient Instructions (Signed)
Community-Acquired Pneumonia, Adult Pneumonia is an infection of the lungs. It causes irritation and swelling in the airways of the lungs. Mucus and fluid may also build up inside the airways. This may cause coughing and trouble breathing. One type of pneumonia can happen while you are in a hospital. A different type can happen when you are not in a hospital (community-acquired pneumonia). What are the causes? This condition is caused by germs (viruses, bacteria, or fungi). Some types of germs can spread from person to person. Pneumonia is not thought to spread from person to person.   What increases the risk? You are more likely to develop this condition if:  You have a long-term (chronic) disease, such as: ? Disease of the lungs. This may be chronic obstructive pulmonary disease (COPD) or asthma. ? Heart failure. ? Cystic fibrosis. ? Diabetes. ? Kidney disease. ? Sickle cell disease. ? HIV.  You have other health problems, such as: ? Your body's defense system (immune system) is weak. ? A condition that may cause you to breathe in fluids from your mouth and nose.  You had your spleen taken out.  You do not take good care of your teeth and mouth (poor dental hygiene).  You use or have used tobacco products.  You travel where the germs that cause this illness are common.  You are near certain animals or the places they live.  You are older than 69 years of age. What are the signs or symptoms? Symptoms of this condition include:  A cough.  A fever.  Sweating or chills.  Chest pain, often when you breathe deeply or cough.  Breathing problems, such as: ? Fast breathing. ? Trouble breathing. ? Shortness of breath.  Feeling tired (fatigued).  Muscle aches. How is this treated? Treatment for this condition depends on many things, such as:  The cause of your illness.  Your medicines.  Your other health problems. Most adults can be treated at home. Sometimes,  treatment must happen in a hospital.  Treatment may include medicines to kill germs.  Medicines may depend on which germ caused your illness. Very bad pneumonia is rare. If you get it, you may:  Have a machine to help you breathe.  Have fluid taken away from around your lungs. Follow these instructions at home: Medicines  Take over-the-counter and prescription medicines only as told by your doctor.  Take cough medicine only if you are losing sleep. Cough medicine can keep your body from taking mucus away from your lungs.  If you were prescribed an antibiotic medicine, take it as told by your doctor. Do not stop taking the antibiotic even if you start to feel better. Lifestyle  Do not drink alcohol.  Do not use any products that contain nicotine or tobacco, such as cigarettes, e-cigarettes, and chewing tobacco. If you need help quitting, ask your doctor.  Eat a healthy diet. This includes a lot of vegetables, fruits, whole grains, low-fat dairy products, and low-fat (lean) protein.      General instructions  Rest a lot. Sleep for at least 8 hours each night.  Sleep with your head and neck raised. Put a few pillows under your head or sleep in a reclining chair.  Return to your normal activities as told by your doctor. Ask your doctor what activities are safe for you.  Drink enough fluid to keep your pee (urine) pale yellow.  If your throat is sore, rinse your mouth often with salt water. To make salt   water, dissolve -1 tsp (3-6 g) of salt in 1 cup (237 mL) of warm water.  Keep all follow-up visits as told by your doctor. This is important.   How is this prevented? You can lower your risk of pneumonia by:  Getting the pneumonia shot (vaccine). These shots have different types and schedules. Ask your doctor what works best for you. Think about getting this shot if: ? You are older than 69 years of age. ? You are 19-65 years of age and:  You are being treated for  cancer.  You have long-term lung disease.  You have other problems that affect your body's defense system. Ask your doctor if you have one of these.  Getting your flu shot every year. Ask your doctor which type of shot is best for you.  Going to the dentist as often as told.  Washing your hands often with soap and water for at least 20 seconds. If you cannot use soap and water, use hand sanitizer. Contact a doctor if:  You have a fever.  You lose sleep because your cough medicine does not help. Get help right away if:  You are short of breath and this gets worse.  You have more chest pain.  Your sickness gets worse. This is very serious if: ? You are an older adult. ? Your body's defense system is weak.  You cough up blood. These symptoms may be an emergency. Do not wait to see if the symptoms will go away. Get medical help right away. Call your local emergency services (911 in the [place] Do not drive yourself to the hospital. Summary  Pneumonia is an infection of the lungs.  Community-acquired pneumonia affects people who have not been in the hospital. Certain germs can cause this infection.  This condition may be treated with medicines that kill germs.  For very bad pneumonia, you may need a hospital stay and treatment to help with breathing. This information is not intended to replace advice given to you by your health care provider. Make sure you discuss any questions you have with your health care provider. Document Revised: 04/03/2019 Document Reviewed: 04/03/2019 Elsevier Patient Education  2021 Elsevier Inc.  

## 2020-12-10 NOTE — Progress Notes (Signed)
   Virtual Visit  Note Due to COVID-19 pandemic this visit was conducted virtually. This visit type was conducted due to national recommendations for restrictions regarding the COVID-19 Pandemic (e.g. social distancing, sheltering in place) in an effort to limit this patient's exposure and mitigate transmission in our community. All issues noted in this document were discussed and addressed.  A physical exam was not performed with this format.  I connected with Brittany Conrad on 12/10/20 at 08:10 am  by telephone and verified that I am speaking with the correct person using two identifiers. Brittany Conrad is currently located at home during visit. The provider, Daryll Drown, NP is located in their office at time of visit.  I discussed the limitations, risks, security and privacy concerns of performing an evaluation and management service by telephone and the availability of in person appointments. I also discussed with the patient that there may be a patient responsible charge related to this service. The patient expressed understanding and agreed to proceed.   History and Present Illness:  HPI  Today's visit was for Transitional Care Management.  The patient was discharged from [facility] on 12/06/2020 with a primary diagnosis of Pneumonia Contact with the patient and/or caregiver, by a clinical staff member, was made on 12/09/2020 and was documented as a telephone encounter within the EMR.  Through chart review and discussion with the patient I have determined that management of their condition is of high complexity.     Pneumonia: Patient complains of follow up on pneumonia. Patient describes symptoms of shortness of breath at rest. Symptoms began a few days ago and are gradually worsening since that time. Patient denies cough or nausea and vomiting. Treatment thus far includes Antibiotic and continues oxygen   Review of Systems  Constitutional: Negative for chills and fever.  Respiratory:  Positive for shortness of breath.   Skin: Negative for rash.  Neurological: Positive for weakness.     Observations/Objective: Telephone visit-patient seems to be in distress advised patient to push Emergency button  Assessment and Plan:   Follow Up Instructions: Follow-up with worsening unresolved symptoms.    I discussed the assessment and treatment plan with the patient. The patient was provided an opportunity to ask questions and all were answered. The patient agreed with the plan and demonstrated an understanding of the instructions.   The patient was advised to call back or seek an in-person evaluation if the symptoms worsen or if the condition fails to improve as anticipated.  The above assessment and management plan was discussed with the patient. The patient verbalized understanding of and has agreed to the management plan. Patient is aware to call the clinic if symptoms persist or worsen. Patient is aware when to return to the clinic for a follow-up visit. Patient educated on when it is appropriate to go to the emergency department.   Time call ended: 8:22 AM  I provided 12 minutes of  non face-to-face time during this encounter.    Daryll Drown, NP

## 2020-12-10 NOTE — Assessment & Plan Note (Signed)
Hospital Follow-up with patient discharge instructions.  Patient unable to go over instructions at this time due to.  Current state of worsening shortness of breath.  Advised patient to activate emergency which she did we will follow-up with patient.

## 2020-12-10 NOTE — Assessment & Plan Note (Signed)
Hospital follow-up via televisit.  Patient in distress unable to get up from her couch and calling for help when I made phone call.  Hospital instruction for patient was 24 however 4 L oxygen via nasal cannula, patient already on 4 L nasal cannula at home but was in distress.  Advised to push emergency department, I was on the phone with patient while the call was made, after a few minutes phone call got disconnected while emergency was speaking to patient.  All back to following with patient.

## 2020-12-16 DIAGNOSIS — M25561 Pain in right knee: Secondary | ICD-10-CM | POA: Diagnosis not present

## 2020-12-16 DIAGNOSIS — Z79899 Other long term (current) drug therapy: Secondary | ICD-10-CM | POA: Diagnosis not present

## 2020-12-16 DIAGNOSIS — G609 Hereditary and idiopathic neuropathy, unspecified: Secondary | ICD-10-CM | POA: Diagnosis not present

## 2020-12-16 DIAGNOSIS — M25562 Pain in left knee: Secondary | ICD-10-CM | POA: Diagnosis not present

## 2020-12-17 DIAGNOSIS — J440 Chronic obstructive pulmonary disease with acute lower respiratory infection: Secondary | ICD-10-CM | POA: Diagnosis not present

## 2020-12-17 DIAGNOSIS — J441 Chronic obstructive pulmonary disease with (acute) exacerbation: Secondary | ICD-10-CM | POA: Diagnosis not present

## 2020-12-17 DIAGNOSIS — I10 Essential (primary) hypertension: Secondary | ICD-10-CM | POA: Diagnosis not present

## 2020-12-17 DIAGNOSIS — F419 Anxiety disorder, unspecified: Secondary | ICD-10-CM | POA: Diagnosis not present

## 2020-12-17 DIAGNOSIS — D539 Nutritional anemia, unspecified: Secondary | ICD-10-CM | POA: Diagnosis not present

## 2020-12-17 DIAGNOSIS — J9601 Acute respiratory failure with hypoxia: Secondary | ICD-10-CM | POA: Diagnosis not present

## 2020-12-17 DIAGNOSIS — A419 Sepsis, unspecified organism: Secondary | ICD-10-CM | POA: Diagnosis not present

## 2020-12-17 DIAGNOSIS — J189 Pneumonia, unspecified organism: Secondary | ICD-10-CM | POA: Diagnosis not present

## 2020-12-17 DIAGNOSIS — F32A Depression, unspecified: Secondary | ICD-10-CM | POA: Diagnosis not present

## 2020-12-18 ENCOUNTER — Other Ambulatory Visit: Payer: Self-pay | Admitting: *Deleted

## 2020-12-18 MED ORDER — IPRATROPIUM BROMIDE 0.02 % IN SOLN: 0.5000 mg | Freq: Four times a day (QID) | RESPIRATORY_TRACT | 12 refills | Status: DC

## 2020-12-18 NOTE — Telephone Encounter (Signed)
Diagnoses   Pneumonia due to infectious organism, unspecified laterality, unspecified partof lung   COPD exacerbation (CMS-HCC)    Recent hosp stay at [facility] health and pt received Ipratopium Neb solution   She is requesting a refill on this, but has never gotten from out office.  (See [facility] note for details)   [facility].  Will pend

## 2020-12-22 DIAGNOSIS — J441 Chronic obstructive pulmonary disease with (acute) exacerbation: Secondary | ICD-10-CM | POA: Diagnosis not present

## 2020-12-22 DIAGNOSIS — F419 Anxiety disorder, unspecified: Secondary | ICD-10-CM | POA: Diagnosis not present

## 2020-12-22 DIAGNOSIS — A419 Sepsis, unspecified organism: Secondary | ICD-10-CM | POA: Diagnosis not present

## 2020-12-22 DIAGNOSIS — J440 Chronic obstructive pulmonary disease with acute lower respiratory infection: Secondary | ICD-10-CM | POA: Diagnosis not present

## 2020-12-22 DIAGNOSIS — F32A Depression, unspecified: Secondary | ICD-10-CM | POA: Diagnosis not present

## 2020-12-22 DIAGNOSIS — J9601 Acute respiratory failure with hypoxia: Secondary | ICD-10-CM | POA: Diagnosis not present

## 2020-12-22 DIAGNOSIS — D539 Nutritional anemia, unspecified: Secondary | ICD-10-CM | POA: Diagnosis not present

## 2020-12-22 DIAGNOSIS — I10 Essential (primary) hypertension: Secondary | ICD-10-CM | POA: Diagnosis not present

## 2020-12-22 DIAGNOSIS — J189 Pneumonia, unspecified organism: Secondary | ICD-10-CM | POA: Diagnosis not present

## 2020-12-25 ENCOUNTER — Encounter: Payer: Self-pay | Admitting: Nurse Practitioner

## 2020-12-25 ENCOUNTER — Ambulatory Visit (INDEPENDENT_AMBULATORY_CARE_PROVIDER_SITE_OTHER): Payer: Medicare HMO | Admitting: Nurse Practitioner

## 2020-12-25 DIAGNOSIS — R059 Cough, unspecified: Secondary | ICD-10-CM | POA: Diagnosis not present

## 2020-12-25 MED ORDER — DM-GUAIFENESIN ER 30-600 MG PO TB12: 1.0000 | ORAL_TABLET | Freq: Two times a day (BID) | ORAL | 0 refills | Status: DC

## 2020-12-25 MED ORDER — BENZONATATE 100 MG PO CAPS
100.0000 mg | ORAL_CAPSULE | Freq: Two times a day (BID) | ORAL | 0 refills | Status: DC | PRN
Start: 2020-12-25 — End: 2020-12-31

## 2020-12-25 NOTE — Assessment & Plan Note (Signed)
Patient recently treated with antibiotics for pneumonia 12 days ago at [facility].  Worsening productive cough symptoms.  No fever, nausea or vomiting associated with cough.  Benzonatate 100 mg tablet by mouth guaifenesin for cough and congestion.  Advised patient to give cough some time to resolve.  Follow-up with fever/worsening symptoms.  Patient verbalized understanding.  Rx sent to pharmacy.

## 2020-12-25 NOTE — Progress Notes (Signed)
Virtual Visit  Note Due to COVID-19 pandemic this visit was conducted virtually. This visit type was conducted due to national recommendations for restrictions regarding the COVID-19 Pandemic (e.g. social distancing, sheltering in place) in an effort to limit this patient's exposure and mitigate transmission in our community. All issues noted in this document were discussed and addressed.  A physical exam was not performed with this format.  I connected with Brittany Conrad on 12/25/20 at 11 AM by telephone and verified that I am speaking with the correct person using two identifiers. Brittany Conrad is currently located at home during visit. The provider, Ivy Lynn, NP is located in their office at time of visit.  I discussed the limitations, risks, security and privacy concerns of performing an evaluation and management service by telephone and the availability of in person appointments. I also discussed with the patient that there may be a patient responsible charge related to this service. The patient expressed understanding and agreed to proceed.   History and Present Illness:  Cough This is a recurrent problem. The current episode started 1 to 4 weeks ago. The problem has been unchanged. The problem occurs constantly. The cough is Productive of sputum. Pertinent negatives include no chest pain, chills, fever, headaches or hemoptysis. Nothing aggravates the symptoms. Treatments tried: recent antibiotic. The treatment provided mild relief.     Review of Systems  Constitutional:  Negative for chills and fever.  Respiratory:  Positive for cough. Negative for hemoptysis.   Cardiovascular:  Negative for chest pain.  Neurological:  Negative for headaches.  All other systems reviewed and are negative.   Observations/Objective: Televisit patient not in distress.  Assessment and Plan:  Patient recently treated with antibiotics for pneumonia 12 days ago at [facility].  Worsening  productive cough symptoms.  No fever, nausea or vomiting associated with cough.  Benzonatate 100 mg tablet by mouth guaifenesin for cough and congestion.  Advised patient to give cough some time to resolve.  Follow-up with fever/worsening symptoms.  Patient verbalized understanding.  Rx sent to pharmacy.      Objective:      There were no vitals taken for this visit. Wt Readings from Last 3 Encounters:  11/20/20 174 lb (78.9 kg)  10/23/20 174 lb (78.9 kg)  04/08/20 177 lb (80.3 kg)     Health Maintenance Due  Topic Date Due   Hepatitis C Screening  Never done   Zoster Vaccines- Shingrix (1 of 2) Never done   COVID-19 Vaccine (2 - Janssen risk series) 01/19/2020    There are no preventive care reminders to display for this patient.  No results found for: TSH Lab Results  Component Value Date   WBC 12.7 (H) 09/18/2020   HGB 9.7 (L) 09/18/2020   HCT 29.6 (L) 09/18/2020   MCV 108 (H) 09/18/2020   PLT 553 (H) 09/18/2020   Lab Results  Component Value Date   NA 141 09/18/2020   K 4.6 09/18/2020   CO2 24 09/18/2020   GLUCOSE 102 (H) 09/18/2020   BUN 24 09/18/2020   CREATININE 1.20 (H) 09/18/2020   BILITOT 1.0 04/08/2020   ALKPHOS 200 (H) 04/08/2020   AST 32 04/08/2020   ALT 21 04/08/2020   PROT 7.1 04/08/2020   ALBUMIN 4.3 04/08/2020   CALCIUM 9.3 09/18/2020   EGFR 49 (L) 09/18/2020   Lab Results  Component Value Date   CHOL 188 03/09/2018   Lab Results  Component Value Date   HDL 57  03/09/2018   Lab Results  Component Value Date   LDLCALC 89 03/09/2018   Lab Results  Component Value Date   TRIG 210 (H) 03/09/2018   Lab Results  Component Value Date   CHOLHDL 3.3 03/09/2018   No results found for: HGBA1C    Assessment & Plan:   Problem List Items Addressed This Visit       Other   Cough - Primary    Patient recently treated with antibiotics for pneumonia 12 days ago at [facility].  Worsening productive cough symptoms.  No fever, nausea or  vomiting associated with cough.  Benzonatate 100 mg tablet by mouth guaifenesin for cough and congestion.  Advised patient to give cough some time to resolve.  Follow-up with fever/worsening symptoms.  Patient verbalized understanding.  Rx sent to pharmacy.       Relevant Medications   benzonatate (TESSALON) 100 MG capsule   dextromethorphan-guaiFENesin (MUCINEX DM) 30-600 MG 12hr tablet    Meds ordered this encounter  Medications   benzonatate (TESSALON) 100 MG capsule    Sig: Take 1 capsule (100 mg total) by mouth 2 (two) times daily as needed for cough.    Dispense:  20 capsule    Refill:  0    Order Specific Question:   Supervising Provider    Answer:   Janora Norlander [[id number]]   dextromethorphan-guaiFENesin (MUCINEX DM) 30-600 MG 12hr tablet    Sig: Take 1 tablet by mouth 2 (two) times daily.    Dispense:  30 tablet    Refill:  0    Order Specific Question:   Supervising Provider    Answer:   Janora Norlander [[id number]]    Follow-up: Return if symptoms worsen or fail to improve.    Ivy Lynn, NP  Follow Up Instructions:  Follow-up with worsening unresolved symptoms.   I discussed the assessment and treatment plan with the patient. The patient was provided an opportunity to ask questions and all were answered. The patient agreed with the plan and demonstrated an understanding of the instructions.   The patient was advised to call back or seek an in-person evaluation if the symptoms worsen or if the condition fails to improve as anticipated.  The above assessment and management plan was discussed with the patient. The patient verbalized understanding of and has agreed to the management plan. Patient is aware to call the clinic if symptoms persist or worsen. Patient is aware when to return to the clinic for a follow-up visit. Patient educated on when it is appropriate to go to the emergency department.   Time call ended: 11:10 AM  I provided 10 minutes of  non  face-to-face time during this encounter.    Ivy Lynn, NP

## 2020-12-26 DIAGNOSIS — R55 Syncope and collapse: Secondary | ICD-10-CM | POA: Diagnosis not present

## 2020-12-26 DIAGNOSIS — I10 Essential (primary) hypertension: Secondary | ICD-10-CM | POA: Diagnosis not present

## 2020-12-26 DIAGNOSIS — D539 Nutritional anemia, unspecified: Secondary | ICD-10-CM | POA: Diagnosis not present

## 2020-12-26 DIAGNOSIS — J189 Pneumonia, unspecified organism: Secondary | ICD-10-CM | POA: Diagnosis not present

## 2020-12-26 DIAGNOSIS — J9601 Acute respiratory failure with hypoxia: Secondary | ICD-10-CM | POA: Diagnosis not present

## 2020-12-26 DIAGNOSIS — J440 Chronic obstructive pulmonary disease with acute lower respiratory infection: Secondary | ICD-10-CM | POA: Diagnosis not present

## 2020-12-26 DIAGNOSIS — J441 Chronic obstructive pulmonary disease with (acute) exacerbation: Secondary | ICD-10-CM | POA: Diagnosis not present

## 2020-12-26 DIAGNOSIS — F32A Depression, unspecified: Secondary | ICD-10-CM | POA: Diagnosis not present

## 2020-12-26 DIAGNOSIS — A419 Sepsis, unspecified organism: Secondary | ICD-10-CM | POA: Diagnosis not present

## 2020-12-26 DIAGNOSIS — F419 Anxiety disorder, unspecified: Secondary | ICD-10-CM | POA: Diagnosis not present

## 2020-12-29 ENCOUNTER — Other Ambulatory Visit: Payer: Self-pay

## 2020-12-29 ENCOUNTER — Ambulatory Visit (INDEPENDENT_AMBULATORY_CARE_PROVIDER_SITE_OTHER): Payer: Medicare HMO

## 2020-12-29 DIAGNOSIS — Z9981 Dependence on supplemental oxygen: Secondary | ICD-10-CM

## 2020-12-29 DIAGNOSIS — K219 Gastro-esophageal reflux disease without esophagitis: Secondary | ICD-10-CM

## 2020-12-29 DIAGNOSIS — J441 Chronic obstructive pulmonary disease with (acute) exacerbation: Secondary | ICD-10-CM | POA: Diagnosis not present

## 2020-12-29 DIAGNOSIS — I10 Essential (primary) hypertension: Secondary | ICD-10-CM | POA: Diagnosis not present

## 2020-12-29 DIAGNOSIS — F32A Depression, unspecified: Secondary | ICD-10-CM

## 2020-12-29 DIAGNOSIS — J9601 Acute respiratory failure with hypoxia: Secondary | ICD-10-CM | POA: Diagnosis not present

## 2020-12-29 DIAGNOSIS — D539 Nutritional anemia, unspecified: Secondary | ICD-10-CM | POA: Diagnosis not present

## 2020-12-29 DIAGNOSIS — A419 Sepsis, unspecified organism: Secondary | ICD-10-CM

## 2020-12-29 DIAGNOSIS — Z9181 History of falling: Secondary | ICD-10-CM

## 2020-12-29 DIAGNOSIS — E669 Obesity, unspecified: Secondary | ICD-10-CM

## 2020-12-29 DIAGNOSIS — F419 Anxiety disorder, unspecified: Secondary | ICD-10-CM

## 2020-12-29 DIAGNOSIS — J440 Chronic obstructive pulmonary disease with acute lower respiratory infection: Secondary | ICD-10-CM

## 2020-12-29 DIAGNOSIS — M47816 Spondylosis without myelopathy or radiculopathy, lumbar region: Secondary | ICD-10-CM

## 2020-12-29 DIAGNOSIS — J189 Pneumonia, unspecified organism: Secondary | ICD-10-CM

## 2020-12-29 DIAGNOSIS — K5730 Diverticulosis of large intestine without perforation or abscess without bleeding: Secondary | ICD-10-CM

## 2020-12-29 DIAGNOSIS — Z6831 Body mass index (BMI) 31.0-31.9, adult: Secondary | ICD-10-CM

## 2020-12-30 NOTE — Chronic Care Management (AMB) (Signed)
  Care Management   Note  12/30/2020 Name: Brittany Conrad MRN: [id number] DOB: [birth year]  Brittany Conrad is a 70 y.o. year old female who is a primary care patient of Stacks, Broadus John, MD and is actively engaged with the care management team. I reached out to Ronnette Juniper by phone today to assist with re-scheduling a follow up visit with the RN Case Manager  Follow up plan: Unsuccessful telephone outreach attempt made. A HIPAA compliant phone message was left for the patient providing contact information and requesting a return call.  The care management team will reach out to the patient again over the next 7 days.  If patient returns call to provider office, please advise to call Embedded Care Management Care Guide Penne Lash  at [phone]  Penne Lash, RMA Care Guide, Embedded Care Coordination [facility]  [place], [place] [address] Direct Dial: [phone] [email or web address] Website: [email or web address]

## 2020-12-31 ENCOUNTER — Telehealth: Payer: Self-pay | Admitting: Family Medicine

## 2020-12-31 ENCOUNTER — Other Ambulatory Visit: Payer: Self-pay | Admitting: Family Medicine

## 2020-12-31 DIAGNOSIS — A419 Sepsis, unspecified organism: Secondary | ICD-10-CM | POA: Diagnosis not present

## 2020-12-31 DIAGNOSIS — F419 Anxiety disorder, unspecified: Secondary | ICD-10-CM | POA: Diagnosis not present

## 2020-12-31 DIAGNOSIS — J9601 Acute respiratory failure with hypoxia: Secondary | ICD-10-CM | POA: Diagnosis not present

## 2020-12-31 DIAGNOSIS — J189 Pneumonia, unspecified organism: Secondary | ICD-10-CM | POA: Diagnosis not present

## 2020-12-31 DIAGNOSIS — F32A Depression, unspecified: Secondary | ICD-10-CM | POA: Diagnosis not present

## 2020-12-31 DIAGNOSIS — R059 Cough, unspecified: Secondary | ICD-10-CM

## 2020-12-31 DIAGNOSIS — D539 Nutritional anemia, unspecified: Secondary | ICD-10-CM | POA: Diagnosis not present

## 2020-12-31 DIAGNOSIS — I10 Essential (primary) hypertension: Secondary | ICD-10-CM | POA: Diagnosis not present

## 2020-12-31 DIAGNOSIS — J441 Chronic obstructive pulmonary disease with (acute) exacerbation: Secondary | ICD-10-CM | POA: Diagnosis not present

## 2020-12-31 DIAGNOSIS — J440 Chronic obstructive pulmonary disease with acute lower respiratory infection: Secondary | ICD-10-CM | POA: Diagnosis not present

## 2020-12-31 MED ORDER — BENZONATATE 100 MG PO CAPS: 100.0000 mg | ORAL_CAPSULE | Freq: Two times a day (BID) | ORAL | 0 refills | Status: DC | PRN

## 2020-12-31 NOTE — Telephone Encounter (Signed)
Pt says that benzonatate (TESSALON) 100 MG capsule went down the drain and needs another rx  [facility]

## 2020-12-31 NOTE — Telephone Encounter (Signed)
Please let the patient know that I sent their prescription to their pharmacy. Thanks, WS 

## 2021-01-01 DIAGNOSIS — J441 Chronic obstructive pulmonary disease with (acute) exacerbation: Secondary | ICD-10-CM | POA: Diagnosis not present

## 2021-01-01 DIAGNOSIS — J9601 Acute respiratory failure with hypoxia: Secondary | ICD-10-CM | POA: Diagnosis not present

## 2021-01-01 DIAGNOSIS — J440 Chronic obstructive pulmonary disease with acute lower respiratory infection: Secondary | ICD-10-CM | POA: Diagnosis not present

## 2021-01-01 DIAGNOSIS — J189 Pneumonia, unspecified organism: Secondary | ICD-10-CM | POA: Diagnosis not present

## 2021-01-01 DIAGNOSIS — F419 Anxiety disorder, unspecified: Secondary | ICD-10-CM | POA: Diagnosis not present

## 2021-01-01 DIAGNOSIS — D539 Nutritional anemia, unspecified: Secondary | ICD-10-CM | POA: Diagnosis not present

## 2021-01-01 DIAGNOSIS — F32A Depression, unspecified: Secondary | ICD-10-CM | POA: Diagnosis not present

## 2021-01-01 DIAGNOSIS — I10 Essential (primary) hypertension: Secondary | ICD-10-CM | POA: Diagnosis not present

## 2021-01-01 DIAGNOSIS — A419 Sepsis, unspecified organism: Secondary | ICD-10-CM | POA: Diagnosis not present

## 2021-01-01 NOTE — Telephone Encounter (Signed)
Patient aware and verbalized understanding. °

## 2021-01-05 DIAGNOSIS — J441 Chronic obstructive pulmonary disease with (acute) exacerbation: Secondary | ICD-10-CM | POA: Diagnosis not present

## 2021-01-05 DIAGNOSIS — J440 Chronic obstructive pulmonary disease with acute lower respiratory infection: Secondary | ICD-10-CM | POA: Diagnosis not present

## 2021-01-05 DIAGNOSIS — A419 Sepsis, unspecified organism: Secondary | ICD-10-CM | POA: Diagnosis not present

## 2021-01-05 DIAGNOSIS — J9601 Acute respiratory failure with hypoxia: Secondary | ICD-10-CM | POA: Diagnosis not present

## 2021-01-05 DIAGNOSIS — F419 Anxiety disorder, unspecified: Secondary | ICD-10-CM | POA: Diagnosis not present

## 2021-01-05 DIAGNOSIS — J189 Pneumonia, unspecified organism: Secondary | ICD-10-CM | POA: Diagnosis not present

## 2021-01-05 DIAGNOSIS — D539 Nutritional anemia, unspecified: Secondary | ICD-10-CM | POA: Diagnosis not present

## 2021-01-05 DIAGNOSIS — I10 Essential (primary) hypertension: Secondary | ICD-10-CM | POA: Diagnosis not present

## 2021-01-05 DIAGNOSIS — F32A Depression, unspecified: Secondary | ICD-10-CM | POA: Diagnosis not present

## 2021-01-06 ENCOUNTER — Other Ambulatory Visit: Payer: Self-pay | Admitting: Family Medicine

## 2021-01-06 ENCOUNTER — Encounter: Payer: Self-pay | Admitting: Nurse Practitioner

## 2021-01-06 ENCOUNTER — Ambulatory Visit (INDEPENDENT_AMBULATORY_CARE_PROVIDER_SITE_OTHER): Payer: Medicare HMO | Admitting: Nurse Practitioner

## 2021-01-06 DIAGNOSIS — R059 Cough, unspecified: Secondary | ICD-10-CM

## 2021-01-06 MED ORDER — PREDNISONE 20 MG PO TABS: 40.0000 mg | ORAL_TABLET | Freq: Every day | ORAL | 0 refills | Status: AC

## 2021-01-06 NOTE — Progress Notes (Signed)
   Virtual Visit  Note Due to COVID-19 pandemic this visit was conducted virtually. This visit type was conducted due to national recommendations for restrictions regarding the COVID-19 Pandemic (e.g. social distancing, sheltering in place) in an effort to limit this patient's exposure and mitigate transmission in our community. All issues noted in this document were discussed and addressed.  A physical exam was not performed with this format.  I connected with Brittany Conrad on 01/06/21 at 1:30 by telephone and verified that I am speaking with the correct person using two identifiers. Brittany Conrad is currently located at home and no o ne is currently with her during visit. The provider, Mary-Margaret Daphine Deutscher, FNP is located in their office at time of visit.  I discussed the limitations, risks, security and privacy concerns of performing an evaluation and management service by telephone and the availability of in person appointments. I also discussed with the patient that there may be a patient responsible charge related to this service. The patient expressed understanding and agreed to proceed.   History and Present Illness:   Chief Complaint: cough  HPI Patient calls in c/o cough that started 2 weeks ago. She has been taking tessalon perles and she still has cough. She was negative for covid.    Review of Systems  Constitutional:  Negative for chills and fever.  HENT:  Negative for congestion, sinus pain and sore throat.   Respiratory:  Positive for cough. Negative for sputum production, shortness of breath and wheezing.   Musculoskeletal:  Negative for myalgias.  Neurological:  Negative for dizziness and headaches.  All other systems reviewed and are negative.   Observations/Objective: Alert and oriented- answers all questions appropriately No distress No cough noted during visit Slight hoarsness   Assessment and Plan: Brittany Conrad in today with chief complaint of Cough   1.  Cough Forec fluids Run humidifier - predniSONE (DELTASONE) 20 MG tablet; Take 2 tablets (40 mg total) by mouth daily with breakfast for 5 days. 2 po daily for 5 days  Dispense: 10 tablet; Refill: 0    Follow Up Instructions: prn    I discussed the assessment and treatment plan with the patient. The patient was provided an opportunity to ask questions and all were answered. The patient agreed with the plan and demonstrated an understanding of the instructions.   The patient was advised to call back or seek an in-person evaluation if the symptoms worsen or if the condition fails to improve as anticipated.  The above assessment and management plan was discussed with the patient. The patient verbalized understanding of and has agreed to the management plan. Patient is aware to call the clinic if symptoms persist or worsen. Patient is aware when to return to the clinic for a follow-up visit. Patient educated on when it is appropriate to go to the emergency department.   Time call ended:  12:43  I provided 13 minutes of  non face-to-face time during this encounter.    Mary-Margaret Daphine Deutscher, FNP

## 2021-01-07 ENCOUNTER — Other Ambulatory Visit: Payer: Self-pay | Admitting: Family Medicine

## 2021-01-07 DIAGNOSIS — J449 Chronic obstructive pulmonary disease, unspecified: Secondary | ICD-10-CM

## 2021-01-07 DIAGNOSIS — A419 Sepsis, unspecified organism: Secondary | ICD-10-CM | POA: Diagnosis not present

## 2021-01-07 DIAGNOSIS — J9601 Acute respiratory failure with hypoxia: Secondary | ICD-10-CM | POA: Diagnosis not present

## 2021-01-07 DIAGNOSIS — D539 Nutritional anemia, unspecified: Secondary | ICD-10-CM | POA: Diagnosis not present

## 2021-01-07 DIAGNOSIS — J189 Pneumonia, unspecified organism: Secondary | ICD-10-CM | POA: Diagnosis not present

## 2021-01-07 DIAGNOSIS — J441 Chronic obstructive pulmonary disease with (acute) exacerbation: Secondary | ICD-10-CM | POA: Diagnosis not present

## 2021-01-07 DIAGNOSIS — F32A Depression, unspecified: Secondary | ICD-10-CM | POA: Diagnosis not present

## 2021-01-07 DIAGNOSIS — F419 Anxiety disorder, unspecified: Secondary | ICD-10-CM | POA: Diagnosis not present

## 2021-01-07 DIAGNOSIS — J440 Chronic obstructive pulmonary disease with acute lower respiratory infection: Secondary | ICD-10-CM | POA: Diagnosis not present

## 2021-01-07 DIAGNOSIS — I10 Essential (primary) hypertension: Secondary | ICD-10-CM | POA: Diagnosis not present

## 2021-01-09 NOTE — Chronic Care Management (AMB) (Signed)
  Care Management   Note  01/09/2021 Name: Annalysse Shoemaker MRN: [id number] DOB: 03/18/1951  Honi Name is a 70 y.o. year old female who is a primary care patient of Stacks, Broadus John, MD and is actively engaged with the care management team. I reached out to Ronnette Juniper by phone today to assist with re-scheduling a follow up visit with the RN Case Manager  Follow up plan: Telephone appointment with care management team member scheduled for:01/16/2021  Penne Lash, RMA Care Guide, Embedded Care Coordination [facility]  [place], [place] [address] Direct Dial: [phone] [email or web address] Website: [email or web address]

## 2021-01-09 NOTE — Telephone Encounter (Signed)
Patient has been rescheduled.

## 2021-01-12 ENCOUNTER — Other Ambulatory Visit: Payer: Self-pay | Admitting: Family Medicine

## 2021-01-12 DIAGNOSIS — J449 Chronic obstructive pulmonary disease, unspecified: Secondary | ICD-10-CM

## 2021-01-13 DIAGNOSIS — G609 Hereditary and idiopathic neuropathy, unspecified: Secondary | ICD-10-CM | POA: Diagnosis not present

## 2021-01-13 DIAGNOSIS — R6889 Other general symptoms and signs: Secondary | ICD-10-CM | POA: Diagnosis not present

## 2021-01-13 DIAGNOSIS — M25562 Pain in left knee: Secondary | ICD-10-CM | POA: Diagnosis not present

## 2021-01-13 DIAGNOSIS — M25561 Pain in right knee: Secondary | ICD-10-CM | POA: Diagnosis not present

## 2021-01-13 DIAGNOSIS — Z79899 Other long term (current) drug therapy: Secondary | ICD-10-CM | POA: Diagnosis not present

## 2021-01-15 DIAGNOSIS — D539 Nutritional anemia, unspecified: Secondary | ICD-10-CM | POA: Diagnosis not present

## 2021-01-15 DIAGNOSIS — F32A Depression, unspecified: Secondary | ICD-10-CM | POA: Diagnosis not present

## 2021-01-15 DIAGNOSIS — F419 Anxiety disorder, unspecified: Secondary | ICD-10-CM | POA: Diagnosis not present

## 2021-01-15 DIAGNOSIS — J189 Pneumonia, unspecified organism: Secondary | ICD-10-CM | POA: Diagnosis not present

## 2021-01-15 DIAGNOSIS — A419 Sepsis, unspecified organism: Secondary | ICD-10-CM | POA: Diagnosis not present

## 2021-01-15 DIAGNOSIS — J441 Chronic obstructive pulmonary disease with (acute) exacerbation: Secondary | ICD-10-CM | POA: Diagnosis not present

## 2021-01-15 DIAGNOSIS — I10 Essential (primary) hypertension: Secondary | ICD-10-CM | POA: Diagnosis not present

## 2021-01-15 DIAGNOSIS — J440 Chronic obstructive pulmonary disease with acute lower respiratory infection: Secondary | ICD-10-CM | POA: Diagnosis not present

## 2021-01-15 DIAGNOSIS — J9601 Acute respiratory failure with hypoxia: Secondary | ICD-10-CM | POA: Diagnosis not present

## 2021-01-16 ENCOUNTER — Ambulatory Visit (INDEPENDENT_AMBULATORY_CARE_PROVIDER_SITE_OTHER): Payer: Medicare HMO | Admitting: *Deleted

## 2021-01-16 ENCOUNTER — Encounter (HOSPITAL_COMMUNITY): Payer: Self-pay

## 2021-01-16 DIAGNOSIS — J449 Chronic obstructive pulmonary disease, unspecified: Secondary | ICD-10-CM | POA: Diagnosis not present

## 2021-01-16 DIAGNOSIS — I509 Heart failure, unspecified: Secondary | ICD-10-CM | POA: Diagnosis not present

## 2021-01-16 NOTE — Progress Notes (Signed)
I have attempted to reach the patient regarding lung cancer screening program. Unable to reach the patient at this time. Detailed VM left asking that the patient return my call.

## 2021-01-19 ENCOUNTER — Encounter: Payer: Self-pay | Admitting: Family Medicine

## 2021-01-19 ENCOUNTER — Ambulatory Visit: Payer: Medicare HMO | Admitting: Family Medicine

## 2021-01-19 NOTE — Chronic Care Management (AMB) (Signed)
Chronic Care Management   CCM RN Visit Note  01/16/2021 Name: Brittany Conrad MRN: [id number] DOB: 05-Mar-1951  Subjective: Brittany Conrad is a 70 y.o. year old female who is a primary care patient of Stacks, Broadus John, MD. The care management team was consulted for assistance with disease management and care coordination needs.    Engaged with patient by telephone for follow up visit in response to provider referral for case management and/or care coordination services.   Consent to Services:  The patient was given information about Chronic Care Management services, agreed to services, and gave verbal consent prior to initiation of services.  Please see initial visit note for detailed documentation.   Patient agreed to services and verbal consent obtained.   Assessment: Review of patient past medical history, allergies, medications, health status, including review of consultants reports, laboratory and other test data, was performed as part of comprehensive evaluation and provision of chronic care management services.   SDOH (Social Determinants of Health) assessments and interventions performed:    CCM Care Plan  No Known Allergies  Outpatient Encounter Medications as of 01/16/2021  Medication Sig Note   albuterol (PROVENTIL) (2.5 MG/3ML) 0.083% nebulizer solution INHALE 3 ML BY NEBULIZATION EVERY 6 HOURS AS NEEDED FOR WHEEZING OR SHORTNESS OF BREATH    albuterol (VENTOLIN HFA) 108 (90 Base) MCG/ACT inhaler INHALE 1-2 PUFFS BY MOUTH EVERY 6 HOURS AS NEEDED FOR WHEEZE OR SHORTNESS OF BREATH    amitriptyline (ELAVIL) 100 MG tablet Take 2 tablets (200 mg total) by mouth at bedtime. (Needs to be seen before next refill)    amLODipine (NORVASC) 10 MG tablet Take 1 tablet (10 mg total) by mouth daily.    amLODipine (NORVASC) 5 MG tablet TAKE 1 TABLET BY MOUTH EVERY DAY FOR BLOOD PRESSURE    benzonatate (TESSALON) 100 MG capsule Take 1 capsule (100 mg total) by mouth 2 (two) times daily as needed  for cough.    busPIRone (BUSPAR) 10 MG tablet Take 1 tablet (10 mg total) by mouth 3 (three) times daily.    clonazePAM (KLONOPIN) 0.5 MG tablet Take by mouth.    [employer] D3 25 MCG (1000 UT) capsule TAKE 1 CAPSULE (1,000 UNITS TOTAL) BY MOUTH DAILY.    dextromethorphan-guaiFENesin (MUCINEX DM) 30-600 MG 12hr tablet Take 1 tablet by mouth 2 (two) times daily.    diclofenac Sodium (VOLTAREN) 1 % GEL     doxycycline (VIBRAMYCIN) 100 MG capsule Take by mouth.    fluticasone (FLONASE) 50 MCG/ACT nasal spray 1 spray into each nostril in the morning.    furosemide (LASIX) 40 MG tablet TAKE 1 TABLET BY MOUTH TWICE A DAY    gabapentin (NEURONTIN) 400 MG capsule Take 400 mg by mouth 3 (three) times daily.    HYDROcodone-acetaminophen (NORCO) 7.5-325 MG tablet Take 1 tablet by mouth every 6 (six) hours as needed for moderate pain. 11/20/2020: Taking QID   hydrOXYzine (VISTARIL) 25 MG capsule Take 1 capsule (25 mg total) by mouth at bedtime as needed (insomnia).    ibuprofen (ADVIL,MOTRIN) 800 MG tablet Take 800 mg by mouth 3 (three) times daily as needed.     ipratropium (ATROVENT) 0.02 % nebulizer solution Take 2.5 mLs (0.5 mg total) by nebulization 4 (four) times daily.    KLOR-CON M20 20 MEQ tablet TAKE 2 TABLETS (40 MEQ TOTAL) BY MOUTH 3 (THREE) TIMES DAILY. FOR POTASSIUM REPLACEMENT/ SUPPLEMENT    OLANZapine (ZYPREXA) 10 MG tablet Take 1 tablet (10 mg total) by mouth at bedtime.  pantoprazole (PROTONIX) 40 MG tablet TAKE 1 TABLET BY MOUTH TWICE A DAY    Potassium Chloride (KLOR-CON PO) Take 40 mEq by mouth 3 (three) times daily.    sucralfate (CARAFATE) 1 g tablet     XARELTO 10 MG TABS tablet     No facility-administered encounter medications on file as of 01/16/2021.    Patient Active Problem List   Diagnosis Date Noted   Cough 12/25/2020   Hospital discharge follow-up 12/10/2020   Leukocytosis 11/29/2020   Pneumonia 11/29/2020   Sepsis (HCC) 11/29/2020   Chronic congestive heart failure,  unspecified heart failure type (HCC) 10/23/2020   Essential hypertension 10/23/2020   Benzodiazepine dependence, continuous (HCC) 10/23/2020   Chronic pain syndrome 10/23/2020   Acute respiratory failure (HCC) 09/07/2020   MDD (major depressive disorder), recurrent episode, mild (HCC) 09/07/2019   GAD (generalized anxiety disorder) 03/01/2019   Normocytic anemia 04/13/2018   COPD (chronic obstructive pulmonary disease) (HCC) 04/13/2018    Conditions to be addressed/monitored:CHF, HTN, and COPD  Care Plan : RNCM: COPD (Adult)  Updates made by Gwenith Daily, RN since 01/19/2021 12:00 AM     Problem: Symptom Exacerbation (COPD)      Long-Range Goal: Symptom Exacerbation Prevented or Minimized   Start Date: 11/03/2020  This Visit's Progress: On track  Recent Progress: On track  Priority: Medium  Note:   Current Barriers:  Chronic Disease Management support and education needs related to COPD Does not drive Does not have a maintenance inhaler Comorbidities: HTN, CHF  Nurse Case Manager Clinical Goal(s):  patient will work with PCP to address needs related to medical management of COPD patient will meet with RN Care Manager to address Self management of COPD the patient will demonstrate ongoing self health care management ability as evidenced by identifying and limiting triggers to COPD flare-ups*  Interventions:  1:1 collaboration with Mechele Claude, MD regarding development and update of comprehensive plan of care as evidenced by provider attestation and co-signature Inter-disciplinary care team collaboration (see longitudinal plan of care) Chart reviewed including relevant office notes, hospital notes, imaging reports and lab results Evaluation of current treatment plan related to COPD and patient's adherence to plan as established by provider. Discussed plans with patient for ongoing care management follow up and provided patient with direct contact information for care  management team Reviewed and discussed medications Using albuterol nebulizer daily Does not have a maintenance inhaler Misplaced rescue inhaler and needs a new one Reviewed upcoming appointment with PCP on 01/19/21 RNCM to collaborate with PCP regarding wether maintenance inhaler is appropriate Discussed current symptoms  Patient feels short of breath often and questions if oxygen is appropriate. She does not have a way to check her oxygen level at home.  Discussed family/social support Sister lives around the corner from her. She visits and talks with her daily. Sister helps with transportation.  Is not part of any social groups but is content with that Encouraged an increase in physical activity as tolerated Limited by COPD, chronic pain, and fear of falling Discussed hobbies Enjoys working in her flower garden Discussed COPD Action Plan and symptom tracking Advised to monitor for signs of respiratory infection, including changes in sputum color, volume and thickness, as well as fever. Encouraged patient to seek appropriate medical attention for new or worsening symptoms Provided with RNCM contact number and encouraged to reach out as needed  Self Care Activities:  Self administers medications as prescribed Attends all scheduled provider appointments Calls pharmacy for medication  refills Performs ADL's independently Calls provider office for new concerns or questions  Patient Goals Over the next 90 days, patient will: begin a symptom diary bring symptom diary to all visits develop a rescue plan follow rescue plan if symptoms flare-up Seek appropriate medical attention for any new or worsening symptoms Talk with PCP regarding COPD management and possibly getting a maintenance inhaler eliminate symptom triggers at home keep follow-up appointments use an extra pillow to sleep  Call RN Care Manager as needed [phone]  Follow Up Plan:  Telephone follow up appointment with  care management team member scheduled for: 02/20/21 with RNCM The patient has been provided with contact information for the care management team and has been advised to call with any health related questions or concerns.  Next PCP appointment scheduled for: 01/19/21 with Dr Darlyn Read       Plan:Telephone follow up appointment with care management team member scheduled for:  02/20/21 with RNCM  Demetrios Loll, BSN, RN-BC Embedded Chronic Care Manager [facility] / [facility] Care Management Direct Dial: [phone]

## 2021-01-19 NOTE — Patient Instructions (Signed)
Visit Information  PATIENT GOALS:  Goals Addressed             This Visit's Progress    Track and Manage My Symptoms-COPD   On track    Timeframe:  Long-Range Goal Priority:  Medium Start Date:  11/03/20                          Expected End Date:                       Follow Up Date 02/20/21   begin a symptom diary bring symptom diary to all visits develop a rescue plan follow rescue plan if symptoms flare-up Seek appropriate medical attention for any new or worsening symptoms Talk with PCP regarding COPD management and possibly getting a maintenance inhaler eliminate symptom triggers at home keep follow-up appointments use an extra pillow to sleep  Call RN Care Manager at [phone]   Why is this important?   Tracking your symptoms and other information about your health helps your doctor plan your care.  Write down the symptoms, the time of day, what you were doing and what medicine you are taking.  You will soon learn how to manage your symptoms.     Notes:         The patient verbalized understanding of instructions, educational materials, and care plan provided today and declined offer to receive copy of patient instructions, educational materials, and care plan.   Telephone follow up appointment with care management team member scheduled for: 02/20/21 with RNCM  Demetrios Loll, BSN, RN-BC Embedded Chronic Care Manager [facility] / [facility] Care Management Direct Dial: [phone]

## 2021-01-22 ENCOUNTER — Ambulatory Visit: Payer: Medicare HMO | Admitting: Family Medicine

## 2021-01-24 DIAGNOSIS — W19XXXA Unspecified fall, initial encounter: Secondary | ICD-10-CM | POA: Diagnosis not present

## 2021-01-24 DIAGNOSIS — S0990XA Unspecified injury of head, initial encounter: Secondary | ICD-10-CM | POA: Diagnosis not present

## 2021-01-28 DIAGNOSIS — T148XXA Other injury of unspecified body region, initial encounter: Secondary | ICD-10-CM | POA: Diagnosis not present

## 2021-01-28 DIAGNOSIS — W19XXXA Unspecified fall, initial encounter: Secondary | ICD-10-CM | POA: Diagnosis not present

## 2021-01-31 DIAGNOSIS — J449 Chronic obstructive pulmonary disease, unspecified: Secondary | ICD-10-CM | POA: Diagnosis not present

## 2021-01-31 DIAGNOSIS — M545 Low back pain, unspecified: Secondary | ICD-10-CM | POA: Diagnosis not present

## 2021-01-31 DIAGNOSIS — G8929 Other chronic pain: Secondary | ICD-10-CM | POA: Diagnosis not present

## 2021-01-31 DIAGNOSIS — Z9884 Bariatric surgery status: Secondary | ICD-10-CM | POA: Diagnosis not present

## 2021-01-31 DIAGNOSIS — Z79899 Other long term (current) drug therapy: Secondary | ICD-10-CM | POA: Diagnosis not present

## 2021-01-31 DIAGNOSIS — Z9049 Acquired absence of other specified parts of digestive tract: Secondary | ICD-10-CM | POA: Diagnosis not present

## 2021-01-31 DIAGNOSIS — Z9851 Tubal ligation status: Secondary | ICD-10-CM | POA: Diagnosis not present

## 2021-01-31 DIAGNOSIS — R531 Weakness: Secondary | ICD-10-CM | POA: Diagnosis not present

## 2021-01-31 DIAGNOSIS — I959 Hypotension, unspecified: Secondary | ICD-10-CM | POA: Diagnosis not present

## 2021-01-31 DIAGNOSIS — I10 Essential (primary) hypertension: Secondary | ICD-10-CM | POA: Diagnosis not present

## 2021-01-31 DIAGNOSIS — Z885 Allergy status to narcotic agent status: Secondary | ICD-10-CM | POA: Diagnosis not present

## 2021-02-05 DIAGNOSIS — J189 Pneumonia, unspecified organism: Secondary | ICD-10-CM | POA: Diagnosis not present

## 2021-02-06 DIAGNOSIS — R062 Wheezing: Secondary | ICD-10-CM | POA: Diagnosis not present

## 2021-02-08 DIAGNOSIS — R531 Weakness: Secondary | ICD-10-CM | POA: Diagnosis not present

## 2021-02-08 DIAGNOSIS — R5381 Other malaise: Secondary | ICD-10-CM | POA: Diagnosis not present

## 2021-02-08 DIAGNOSIS — W19XXXA Unspecified fall, initial encounter: Secondary | ICD-10-CM | POA: Diagnosis not present

## 2021-02-09 DIAGNOSIS — Z20822 Contact with and (suspected) exposure to covid-19: Secondary | ICD-10-CM | POA: Diagnosis not present

## 2021-02-09 DIAGNOSIS — I517 Cardiomegaly: Secondary | ICD-10-CM | POA: Diagnosis not present

## 2021-02-09 DIAGNOSIS — R296 Repeated falls: Secondary | ICD-10-CM | POA: Insufficient documentation

## 2021-02-09 DIAGNOSIS — M25562 Pain in left knee: Secondary | ICD-10-CM | POA: Insufficient documentation

## 2021-02-09 DIAGNOSIS — B9689 Other specified bacterial agents as the cause of diseases classified elsewhere: Secondary | ICD-10-CM | POA: Diagnosis not present

## 2021-02-09 DIAGNOSIS — Z79899 Other long term (current) drug therapy: Secondary | ICD-10-CM | POA: Diagnosis not present

## 2021-02-09 DIAGNOSIS — R0602 Shortness of breath: Secondary | ICD-10-CM | POA: Diagnosis not present

## 2021-02-09 DIAGNOSIS — B952 Enterococcus as the cause of diseases classified elsewhere: Secondary | ICD-10-CM | POA: Diagnosis not present

## 2021-02-09 DIAGNOSIS — Z87891 Personal history of nicotine dependence: Secondary | ICD-10-CM | POA: Diagnosis not present

## 2021-02-09 DIAGNOSIS — N390 Urinary tract infection, site not specified: Secondary | ICD-10-CM | POA: Diagnosis not present

## 2021-02-09 DIAGNOSIS — I509 Heart failure, unspecified: Secondary | ICD-10-CM | POA: Diagnosis not present

## 2021-02-09 DIAGNOSIS — B9620 Unspecified Escherichia coli [E. coli] as the cause of diseases classified elsewhere: Secondary | ICD-10-CM | POA: Diagnosis not present

## 2021-02-09 DIAGNOSIS — R531 Weakness: Secondary | ICD-10-CM | POA: Diagnosis not present

## 2021-02-09 DIAGNOSIS — D72829 Elevated white blood cell count, unspecified: Secondary | ICD-10-CM | POA: Diagnosis not present

## 2021-02-09 DIAGNOSIS — B9561 Methicillin susceptible Staphylococcus aureus infection as the cause of diseases classified elsewhere: Secondary | ICD-10-CM | POA: Diagnosis not present

## 2021-02-09 DIAGNOSIS — E876 Hypokalemia: Secondary | ICD-10-CM | POA: Diagnosis not present

## 2021-02-09 DIAGNOSIS — M25561 Pain in right knee: Secondary | ICD-10-CM | POA: Insufficient documentation

## 2021-02-09 DIAGNOSIS — I519 Heart disease, unspecified: Secondary | ICD-10-CM | POA: Diagnosis not present

## 2021-02-09 DIAGNOSIS — J440 Chronic obstructive pulmonary disease with acute lower respiratory infection: Secondary | ICD-10-CM | POA: Diagnosis not present

## 2021-02-09 DIAGNOSIS — R5381 Other malaise: Secondary | ICD-10-CM | POA: Diagnosis not present

## 2021-02-09 DIAGNOSIS — B9562 Methicillin resistant Staphylococcus aureus infection as the cause of diseases classified elsewhere: Secondary | ICD-10-CM | POA: Diagnosis not present

## 2021-02-09 DIAGNOSIS — J189 Pneumonia, unspecified organism: Secondary | ICD-10-CM | POA: Diagnosis not present

## 2021-02-09 DIAGNOSIS — J441 Chronic obstructive pulmonary disease with (acute) exacerbation: Secondary | ICD-10-CM | POA: Diagnosis not present

## 2021-02-09 DIAGNOSIS — Z8679 Personal history of other diseases of the circulatory system: Secondary | ICD-10-CM | POA: Diagnosis not present

## 2021-02-09 DIAGNOSIS — B957 Other staphylococcus as the cause of diseases classified elsewhere: Secondary | ICD-10-CM | POA: Diagnosis not present

## 2021-02-09 DIAGNOSIS — M25551 Pain in right hip: Secondary | ICD-10-CM | POA: Diagnosis not present

## 2021-02-09 DIAGNOSIS — M1990 Unspecified osteoarthritis, unspecified site: Secondary | ICD-10-CM | POA: Diagnosis not present

## 2021-02-09 DIAGNOSIS — R319 Hematuria, unspecified: Secondary | ICD-10-CM | POA: Diagnosis not present

## 2021-02-09 DIAGNOSIS — M25552 Pain in left hip: Secondary | ICD-10-CM | POA: Diagnosis not present

## 2021-02-09 DIAGNOSIS — I700 Atherosclerosis of aorta: Secondary | ICD-10-CM | POA: Diagnosis not present

## 2021-02-09 DIAGNOSIS — I5032 Chronic diastolic (congestive) heart failure: Secondary | ICD-10-CM | POA: Diagnosis not present

## 2021-02-09 DIAGNOSIS — R52 Pain, unspecified: Secondary | ICD-10-CM | POA: Diagnosis not present

## 2021-02-09 DIAGNOSIS — W19XXXA Unspecified fall, initial encounter: Secondary | ICD-10-CM | POA: Diagnosis not present

## 2021-02-09 DIAGNOSIS — Z7952 Long term (current) use of systemic steroids: Secondary | ICD-10-CM | POA: Diagnosis not present

## 2021-02-09 DIAGNOSIS — S2231XA Fracture of one rib, right side, initial encounter for closed fracture: Secondary | ICD-10-CM | POA: Diagnosis not present

## 2021-02-10 ENCOUNTER — Telehealth: Payer: Self-pay | Admitting: Family Medicine

## 2021-02-10 NOTE — Telephone Encounter (Signed)
Pts sister called stating that pt really needs a referral for rehab but was told the only way pt could get that is if pt saw pcp. Sister says she is unable to get pt here to the office due to her health issues and being in a wheelchair. Says pt is currently at [facility] and really needs help. Says pts living situation is bad. Sister will not even go into pt's home because it is so bad. Needs advise. Offered video visit, but sister says she doesn't know how to do that.  Please call sister, Tobi Bastos.

## 2021-02-10 NOTE — Telephone Encounter (Signed)
Social services at [facility] should help with that as part of DC planning. Otherwise she will need appointment

## 2021-02-10 NOTE — Telephone Encounter (Signed)
Patient's sister aware.

## 2021-02-16 ENCOUNTER — Ambulatory Visit: Payer: Medicare HMO | Admitting: Family Medicine

## 2021-02-17 ENCOUNTER — Telehealth: Payer: Self-pay

## 2021-02-17 NOTE — Telephone Encounter (Signed)
Transition Care Management Follow-up Telephone Call Date of discharge and from where: [facility] 02/16/21 Diagnosis: UTI, pneumonia How have you been since you were released from the hospital? Very weak Any questions or concerns? No  Items Reviewed: Did the pt receive and understand the discharge instructions provided? Yes  Medications obtained and verified? Yes  Other? No  Any new allergies since your discharge? No  Dietary orders reviewed? Yes Do you have support at home?  Lives alone, but sisters are right around the corner  Home Care and Equipment/Supplies: Were home health services ordered? yes If so, what is the name of the agency? unknown  Has the agency set up a time to come to the patient's home? yes Were any new equipment or medical supplies ordered?  Yes: walker What is the name of the medical supply agency? unknown Were you able to get the supplies/equipment? yes Do you have any questions related to the use of the equipment or supplies? No  Functional Questionnaire: (I = Independent and D = Dependent) ADLs: I  Bathing/Dressing- I  Meal Prep- I  Eating- I  Maintaining continence- I  Transferring/Ambulation- I  Managing Meds- I  Follow up appointments reviewed:  PCP Hospital f/u appt confirmed? Yes  Scheduled to see Harlow Mares on 8/25 @ 11. Specialist Hospital f/u appt confirmed? No   Are transportation arrangements needed? No  If their condition worsens, is the pt aware to call PCP or go to the Emergency Dept.? Yes Was the patient provided with contact information for the PCP's office or ED? Yes Was to pt encouraged to call back with questions or concerns? Yes

## 2021-02-18 ENCOUNTER — Telehealth: Payer: Self-pay

## 2021-02-18 ENCOUNTER — Encounter: Payer: Self-pay | Admitting: Family Medicine

## 2021-02-18 NOTE — Telephone Encounter (Signed)
ERRONEOUS ENCOUNTER--DISREGARD

## 2021-02-20 ENCOUNTER — Ambulatory Visit (INDEPENDENT_AMBULATORY_CARE_PROVIDER_SITE_OTHER): Payer: Medicare HMO | Admitting: *Deleted

## 2021-02-20 DIAGNOSIS — J449 Chronic obstructive pulmonary disease, unspecified: Secondary | ICD-10-CM | POA: Diagnosis not present

## 2021-02-20 DIAGNOSIS — I509 Heart failure, unspecified: Secondary | ICD-10-CM

## 2021-02-20 DIAGNOSIS — Z09 Encounter for follow-up examination after completed treatment for conditions other than malignant neoplasm: Secondary | ICD-10-CM

## 2021-02-22 DIAGNOSIS — J440 Chronic obstructive pulmonary disease with acute lower respiratory infection: Secondary | ICD-10-CM | POA: Diagnosis not present

## 2021-02-22 DIAGNOSIS — D539 Nutritional anemia, unspecified: Secondary | ICD-10-CM | POA: Diagnosis not present

## 2021-02-22 DIAGNOSIS — N390 Urinary tract infection, site not specified: Secondary | ICD-10-CM | POA: Diagnosis not present

## 2021-02-22 DIAGNOSIS — J189 Pneumonia, unspecified organism: Secondary | ICD-10-CM | POA: Diagnosis not present

## 2021-02-22 DIAGNOSIS — I110 Hypertensive heart disease with heart failure: Secondary | ICD-10-CM | POA: Diagnosis not present

## 2021-02-22 DIAGNOSIS — F419 Anxiety disorder, unspecified: Secondary | ICD-10-CM | POA: Diagnosis not present

## 2021-02-22 DIAGNOSIS — U071 COVID-19: Secondary | ICD-10-CM | POA: Diagnosis not present

## 2021-02-22 DIAGNOSIS — I509 Heart failure, unspecified: Secondary | ICD-10-CM | POA: Diagnosis not present

## 2021-02-22 DIAGNOSIS — B9620 Unspecified Escherichia coli [E. coli] as the cause of diseases classified elsewhere: Secondary | ICD-10-CM | POA: Diagnosis not present

## 2021-02-23 ENCOUNTER — Encounter: Payer: Self-pay | Admitting: *Deleted

## 2021-02-23 DIAGNOSIS — S0990XA Unspecified injury of head, initial encounter: Secondary | ICD-10-CM | POA: Diagnosis not present

## 2021-02-23 DIAGNOSIS — W19XXXA Unspecified fall, initial encounter: Secondary | ICD-10-CM | POA: Diagnosis not present

## 2021-02-23 DIAGNOSIS — R58 Hemorrhage, not elsewhere classified: Secondary | ICD-10-CM | POA: Diagnosis not present

## 2021-02-23 NOTE — Patient Instructions (Signed)
Visit Information  PATIENT GOALS:  Goals Addressed             This Visit's Progress    Monitor and Manage Blood Pressure   On track    Timeframe:  Long-Range Goal Priority:  Medium Start Date:        02/20/21                     Expected End Date:                       Follow-up:03/12/21  Check and record blood pressure daily Call PCP with any readings outside of recommended range Keep all medical appointments Take medications as prescribed Take blood pressure log to all PCP and cardiology visits Call RN Care Manager as needed [phone]      Monitor and Manage CHF   On track    Timeframe:  Long-Range Goal Priority:  Medium Start Date:     02/20/21                        Expected End Date:                       Follow-up: 03/12/21  Weigh each morning after urinating and record Call PCP or cardiologist with any weight gain of more than 3 lbs overnight or more than 5 lbs in one week Take medications as prescribed Keep all medical appointments Limit salt/sodium intake Call RN Care Manager as needed [phone]      Prevent Falls and Injury   On track    Timeframe:  Long-Range Goal Priority:  Medium Start Date:  11/03/20                           Expected End Date: 11/03/21                       Follow Up Date 12/03/20    keep my cell phone with me always use a nonslip pad with throw rugs, or remove them completely use a cane or walker Keep a clear path through your home Move carefully and change positions slowly to avoid falls Consider shoe modification to improve leg length discrepancy     Why is this important?   Most falls happen when it is hard for you to walk safely. Your balance may be off because of an illness. You may have pain in your knees, hip or other joints.  You may be overly tired or taking medicines that make you sleepy. You may not be able to see or hear clearly.  Falls can lead to broken bones, bruises or other injuries.  There are things you can do  to help prevent falling.     Notes:      Track and Manage My Symptoms-COPD   On track    Timeframe:  Long-Range Goal Priority:  Medium Start Date:  11/03/20                          Expected End Date:                       Follow Up Date 03/12/21   begin a symptom diary bring symptom diary to all visits develop a rescue plan follow rescue plan if symptoms flare-up  Seek appropriate medical attention for any new or worsening symptoms Talk with PCP regarding COPD management and possibly getting a maintenance inhaler eliminate symptom triggers at home keep follow-up appointments use an extra pillow to sleep  Talk with [facility] regarding home health services Talk with PCP regarding nebulizer machine prescription  Call RN Care Manager at [phone]   Why is this important?   Tracking your symptoms and other information about your health helps your doctor plan your care.  Write down the symptoms, the time of day, what you were doing and what medicine you are taking.  You will soon learn how to manage your symptoms.     Notes:         The patient verbalized understanding of instructions, educational materials, and care plan provided today and declined offer to receive copy of patient instructions, educational materials, and care plan.   Telephone follow up appointment with care management team member scheduled for: 03/12/21 with RNCM  Demetrios Loll, BSN, RN-BC Embedded Chronic Care Manager [facility] / [facility] Care Management Direct Dial: [phone]

## 2021-02-23 NOTE — Chronic Care Management (AMB) (Signed)
Chronic Care Management   CCM RN Visit Note  02/20/2021 Name: Brittany Conrad MRN: [id number] DOB: Aug 16, 1950  Subjective: Brittany Conrad is a 70 y.o. year old female who is a primary care patient of Stacks, Cletus Gash, MD. The care management team was consulted for assistance with disease management and care coordination needs.    Engaged with patient by telephone for follow up visit in response to provider referral for case management and/or care coordination services.   Consent to Services:  The patient was given information about Chronic Care Management services, agreed to services, and gave verbal consent prior to initiation of services.  Please see initial visit note for detailed documentation.   Patient agreed to services and verbal consent obtained.   Assessment: Review of patient past medical history, allergies, medications, health status, including review of consultants reports, laboratory and other test data, was performed as part of comprehensive evaluation and provision of chronic care management services.   SDOH (Social Determinants of Health) assessments and interventions performed:    CCM Care Plan  Allergies  Allergen Reactions   Tramadol     Can't take d/t pain clinic    Outpatient Encounter Medications as of 02/20/2021  Medication Sig Note   albuterol (PROVENTIL) (2.5 MG/3ML) 0.083% nebulizer solution INHALE 3 ML BY NEBULIZATION EVERY 6 HOURS AS NEEDED FOR WHEEZING OR SHORTNESS OF BREATH    albuterol (VENTOLIN HFA) 108 (90 Base) MCG/ACT inhaler INHALE 1-2 PUFFS BY MOUTH EVERY 6 HOURS AS NEEDED FOR WHEEZE OR SHORTNESS OF BREATH    amitriptyline (ELAVIL) 100 MG tablet Take 2 tablets (200 mg total) by mouth at bedtime. (Needs to be seen before next refill)    amLODipine (NORVASC) 10 MG tablet Take 1 tablet (10 mg total) by mouth daily.    amLODipine (NORVASC) 5 MG tablet TAKE 1 TABLET BY MOUTH EVERY DAY FOR BLOOD PRESSURE    benzonatate (TESSALON) 100 MG capsule Take 1  capsule (100 mg total) by mouth 2 (two) times daily as needed for cough.    busPIRone (BUSPAR) 10 MG tablet Take 1 tablet (10 mg total) by mouth 3 (three) times daily.    clonazePAM (KLONOPIN) 0.5 MG tablet Take by mouth.    cloNIDine (CATAPRES) 0.1 MG tablet     [facility] D3 25 MCG (1000 UT) capsule TAKE 1 CAPSULE (1,000 UNITS TOTAL) BY MOUTH DAILY.    dextromethorphan-guaiFENesin (MUCINEX DM) 30-600 MG 12hr tablet Take 1 tablet by mouth 2 (two) times daily.    diclofenac Sodium (VOLTAREN) 1 % GEL     doxycycline (VIBRAMYCIN) 100 MG capsule Take by mouth.    fluticasone (FLONASE) 50 MCG/ACT nasal spray 1 spray into each nostril in the morning.    furosemide (LASIX) 40 MG tablet TAKE 1 TABLET BY MOUTH TWICE A DAY    gabapentin (NEURONTIN) 400 MG capsule Take 400 mg by mouth 3 (three) times daily.    HYDROcodone-acetaminophen (NORCO) 7.5-325 MG tablet Take 1 tablet by mouth every 6 (six) hours as needed for moderate pain. 11/20/2020: Taking QID   hydrOXYzine (VISTARIL) 25 MG capsule Take 1 capsule (25 mg total) by mouth at bedtime as needed (insomnia).    ibuprofen (ADVIL,MOTRIN) 800 MG tablet Take 800 mg by mouth 3 (three) times daily as needed.     ipratropium (ATROVENT) 0.02 % nebulizer solution Take 2.5 mLs (0.5 mg total) by nebulization 4 (four) times daily.    KLOR-CON M20 20 MEQ tablet TAKE 2 TABLETS (40 MEQ TOTAL) BY MOUTH 3 (THREE) TIMES  DAILY. FOR POTASSIUM REPLACEMENT/ SUPPLEMENT    levofloxacin (LEVAQUIN) 750 MG tablet Take by mouth.    OLANZapine (ZYPREXA) 10 MG tablet Take 1 tablet (10 mg total) by mouth at bedtime.    pantoprazole (PROTONIX) 40 MG tablet TAKE 1 TABLET BY MOUTH TWICE A DAY    Potassium Chloride (KLOR-CON PO) Take 40 mEq by mouth 3 (three) times daily.    sucralfate (CARAFATE) 1 g tablet     XARELTO 10 MG TABS tablet     No facility-administered encounter medications on file as of 02/20/2021.    Patient Active Problem List   Diagnosis Date Noted   Arthralgia of both  knees 02/09/2021   Frequent falls 02/09/2021   Urinary tract infection without hematuria 02/09/2021   Cough 12/25/2020   Hospital discharge follow-up 12/10/2020   Leukocytosis 11/29/2020   Pneumonia 11/29/2020   Sepsis ([place]) 11/29/2020   Chronic congestive heart failure, unspecified heart failure type ([place]) 10/23/2020   Essential hypertension 10/23/2020   Benzodiazepine dependence, continuous ([place]) 10/23/2020   Chronic pain syndrome 10/23/2020   Acute respiratory failure ([place]) 09/07/2020   MDD (major depressive disorder), recurrent episode, mild ([place]) 09/07/2019   GAD (generalized anxiety disorder) 03/01/2019   Normocytic anemia 04/13/2018   COPD (chronic obstructive pulmonary disease) ([place]) 04/13/2018    Conditions to be addressed/monitored:CHF, HTN, and COPD  Care Plan : [facility] Care Plan     Problem: Chronic Disease Management Needs   Priority: Medium     Long-Range Goal: Work with [facility] Regarding Care Management and Care Coordination Associated with Chronic Disease States   Start Date: 02/20/2021  This Visit's Progress: On track  Priority: Medium  Note:   Current Barriers:  Chronic Disease Management support and education needs related to CHF, HTN, and COPD Transportation barriers  RNCM Clinical Goal(s):  Patient will continue to work with RN Care Manager to address care management and care coordination needs related to CHF, HTN, and COPD  through collaboration with RN Care manager, provider, and care team.   Interventions: 1:1 collaboration with primary care provider regarding development and update of comprehensive plan of care as evidenced by provider attestation and co-signature Inter-disciplinary care team collaboration (see longitudinal plan of care) Evaluation of current treatment plan related to  self management and patient's adherence to plan as established by provider   Heart Failure Interventions:  (Status: Goal on track: YES.) Basic overview and discussion of  pathophysiology of Heart Failure reviewed; Provided education on low sodium diet; Assessed need for readable accurate scales in home; Provided education about placing scale on hard, flat surface; Advised patient to weigh each morning after emptying bladder; Discussed importance of daily weight and advised patient to weigh and record daily; Reviewed role of diuretics in prevention of fluid overload and management of heart failure; Discussed the importance of keeping all appointments with provider; Provided patient with education about the role of exercise in the management of heart failure; Assessed social determinant of health barriers;   COPD: (Status: Goal on track: YES.) Provided patient with basic written and verbal COPD education on self care/management/and exacerbation prevention; Advised patient to track and manage COPD triggers;  Provided instruction about proper use of medications used for management of COPD including inhalers; Advised patient to self assesses COPD action plan zone and make appointment with provider if in the yellow zone for 48 hours without improvement; Advised patient to engage in light exercise as tolerated 3-5 days a week to aid in the the management of COPD;  Provided education about and advised patient to utilize infection prevention strategies to reduce risk of respiratory infection; Discussed the importance of adequate rest and management of fatigue with COPD; Discussed recent hospitalization for pneumonia and discharge on 02/16/21 Reviewed discharge instructions Discussed referral for [place]. Patient has not been outreached by [facility] to schedule an assessment Collaborated with [facility] Clinical Staff regarding referral for home health and requested that they follow-up with them as I will be out of the office for a few days Discussed nebulizer and solution. Patient has solution but is in need of a nebulizer.  Collaborated with [facility]  Clinical staff regarding need for nebulizer prescription Reviewed upcoming appointment with PCP office for [facility] visit s/p hospital discharge. May need to order nebulizer at that face-to-face visit Questions answered Provided with RNCM contact number and encouraged to reach out as needed  Falls:  (Status: Goal on track: YES.) Reviewed medications and discussed potential side effects of medications such as dizziness and frequent urination Advised patient of importance of notifying provider of falls Assessed for signs and symptoms of orthostatic hypotension Assessed for falls since last encounter Assessed patients knowledge of fall risk prevention secondary to previously provided education Provided patient information for fall alert systems Discussed use of assistive devices. Currently using cane. Has a walker but it is broken.   Hypertension: (Status: Goal on track: YES.) Last practice recorded BP readings:  BP Readings from Last 3 Encounters:  10/23/20 127/86  09/16/20 113/78  04/08/20 (!) 158/98  Most recent eGFR/CrCl:  Lab Results  Component Value Date   EGFR 49 (L) 09/18/2020    No components found for: CRCL  Evaluation of current treatment plan related to hypertension self management and patient's adherence to plan as established by provider;   Reviewed medications with patient and discussed importance of compliance;  Counseled on the importance of exercise goals with target of 150 minutes per week Discussed plans with patient for ongoing care management follow up and provided patient with direct contact information for care management team; Advised patient, providing education and rationale, to monitor blood pressure daily and record, calling PCP for findings outside established parameters;  Discussed complications of poorly controlled blood pressure such as heart disease, stroke, circulatory complications, vision complications, kidney impairment, sexual dysfunction;   Patient  Goals/Self-Care Activities: Patient will self administer medications as prescribed Patient will attend all scheduled provider appointments Patient will call pharmacy for medication refills Patient will continue to perform ADL's independently Patient will continue to perform IADL's independently Patient will call provider office for new concerns or questions       Plan:Telephone follow up appointment with care management team member scheduled for:  03/12/21 with RNCM and The patient has been provided with contact information for the care management team and has been advised to call with any health related questions or concerns.   Chong Sicilian, BSN, RN-BC Embedded Chronic Care Manager [facility] [place] Management Direct Dial: [phone]

## 2021-02-26 ENCOUNTER — Inpatient Hospital Stay: Payer: Medicare HMO | Admitting: Family Medicine

## 2021-02-26 ENCOUNTER — Telehealth: Payer: Self-pay | Admitting: *Deleted

## 2021-02-26 NOTE — Telephone Encounter (Signed)
Call from [facility] w/ [facility] She was with patient to do SOC, pt's HR was 120 & BP of 80/60 EMS had just been there to get pt up from a fall Both HH & I told pt she need to go to ED, she refused She had an 11 am appt here with Tiffany today, we encouraged her to keep this She said to cancel this because she had no ride and was not able to get one

## 2021-02-26 NOTE — Telephone Encounter (Signed)
I called pt to advise she should go to the ED for evaluation and pt said she just left from being there for a week and didn't want to go back. Pt hung up on me.

## 2021-02-26 NOTE — Telephone Encounter (Signed)
Agree that patient should be seen in the ED.

## 2021-02-28 DIAGNOSIS — D62 Acute posthemorrhagic anemia: Secondary | ICD-10-CM | POA: Diagnosis not present

## 2021-02-28 DIAGNOSIS — Z885 Allergy status to narcotic agent status: Secondary | ICD-10-CM | POA: Diagnosis not present

## 2021-02-28 DIAGNOSIS — S6991XA Unspecified injury of right wrist, hand and finger(s), initial encounter: Secondary | ICD-10-CM | POA: Diagnosis not present

## 2021-02-28 DIAGNOSIS — N9489 Other specified conditions associated with female genital organs and menstrual cycle: Secondary | ICD-10-CM | POA: Diagnosis not present

## 2021-02-28 DIAGNOSIS — W19XXXA Unspecified fall, initial encounter: Secondary | ICD-10-CM | POA: Diagnosis not present

## 2021-02-28 DIAGNOSIS — W1839XA Other fall on same level, initial encounter: Secondary | ICD-10-CM | POA: Diagnosis not present

## 2021-02-28 DIAGNOSIS — M898X0 Other specified disorders of bone, multiple sites: Secondary | ICD-10-CM | POA: Diagnosis not present

## 2021-02-28 DIAGNOSIS — S80211A Abrasion, right knee, initial encounter: Secondary | ICD-10-CM | POA: Diagnosis not present

## 2021-02-28 DIAGNOSIS — M545 Low back pain, unspecified: Secondary | ICD-10-CM | POA: Diagnosis not present

## 2021-02-28 DIAGNOSIS — R519 Headache, unspecified: Secondary | ICD-10-CM | POA: Diagnosis not present

## 2021-02-28 DIAGNOSIS — R296 Repeated falls: Secondary | ICD-10-CM | POA: Diagnosis not present

## 2021-02-28 DIAGNOSIS — Z049 Encounter for examination and observation for unspecified reason: Secondary | ICD-10-CM | POA: Diagnosis not present

## 2021-02-28 DIAGNOSIS — R161 Splenomegaly, not elsewhere classified: Secondary | ICD-10-CM | POA: Diagnosis not present

## 2021-02-28 DIAGNOSIS — S0990XA Unspecified injury of head, initial encounter: Secondary | ICD-10-CM | POA: Diagnosis not present

## 2021-02-28 DIAGNOSIS — M79661 Pain in right lower leg: Secondary | ICD-10-CM | POA: Diagnosis not present

## 2021-02-28 DIAGNOSIS — S81811A Laceration without foreign body, right lower leg, initial encounter: Secondary | ICD-10-CM | POA: Diagnosis not present

## 2021-02-28 DIAGNOSIS — Z20822 Contact with and (suspected) exposure to covid-19: Secondary | ICD-10-CM | POA: Diagnosis not present

## 2021-02-28 DIAGNOSIS — S2243XA Multiple fractures of ribs, bilateral, initial encounter for closed fracture: Secondary | ICD-10-CM | POA: Diagnosis not present

## 2021-02-28 DIAGNOSIS — J17 Pneumonia in diseases classified elsewhere: Secondary | ICD-10-CM | POA: Diagnosis not present

## 2021-02-28 DIAGNOSIS — M25522 Pain in left elbow: Secondary | ICD-10-CM | POA: Diagnosis not present

## 2021-02-28 DIAGNOSIS — M542 Cervicalgia: Secondary | ICD-10-CM | POA: Diagnosis not present

## 2021-02-28 DIAGNOSIS — E876 Hypokalemia: Secondary | ICD-10-CM | POA: Diagnosis not present

## 2021-02-28 DIAGNOSIS — Z9181 History of falling: Secondary | ICD-10-CM | POA: Diagnosis not present

## 2021-02-28 DIAGNOSIS — S22088A Other fracture of T11-T12 vertebra, initial encounter for closed fracture: Secondary | ICD-10-CM | POA: Diagnosis not present

## 2021-02-28 DIAGNOSIS — R109 Unspecified abdominal pain: Secondary | ICD-10-CM | POA: Diagnosis not present

## 2021-02-28 DIAGNOSIS — S2241XA Multiple fractures of ribs, right side, initial encounter for closed fracture: Secondary | ICD-10-CM | POA: Diagnosis not present

## 2021-02-28 DIAGNOSIS — R059 Cough, unspecified: Secondary | ICD-10-CM | POA: Diagnosis not present

## 2021-02-28 DIAGNOSIS — R079 Chest pain, unspecified: Secondary | ICD-10-CM | POA: Diagnosis not present

## 2021-02-28 DIAGNOSIS — Y999 Unspecified external cause status: Secondary | ICD-10-CM | POA: Diagnosis not present

## 2021-02-28 DIAGNOSIS — R5381 Other malaise: Secondary | ICD-10-CM | POA: Diagnosis not present

## 2021-02-28 DIAGNOSIS — J449 Chronic obstructive pulmonary disease, unspecified: Secondary | ICD-10-CM | POA: Diagnosis not present

## 2021-02-28 DIAGNOSIS — S22080A Wedge compression fracture of T11-T12 vertebra, initial encounter for closed fracture: Secondary | ICD-10-CM | POA: Diagnosis not present

## 2021-02-28 DIAGNOSIS — R410 Disorientation, unspecified: Secondary | ICD-10-CM | POA: Diagnosis not present

## 2021-02-28 DIAGNOSIS — M549 Dorsalgia, unspecified: Secondary | ICD-10-CM | POA: Diagnosis not present

## 2021-02-28 DIAGNOSIS — R000 Tachycardia, unspecified: Secondary | ICD-10-CM | POA: Diagnosis not present

## 2021-02-28 DIAGNOSIS — R9431 Abnormal electrocardiogram [ECG] [EKG]: Secondary | ICD-10-CM | POA: Diagnosis not present

## 2021-02-28 DIAGNOSIS — J984 Other disorders of lung: Secondary | ICD-10-CM | POA: Diagnosis not present

## 2021-02-28 DIAGNOSIS — M4854XA Collapsed vertebra, not elsewhere classified, thoracic region, initial encounter for fracture: Secondary | ICD-10-CM | POA: Diagnosis not present

## 2021-02-28 DIAGNOSIS — K449 Diaphragmatic hernia without obstruction or gangrene: Secondary | ICD-10-CM | POA: Diagnosis not present

## 2021-02-28 DIAGNOSIS — S22089A Unspecified fracture of T11-T12 vertebra, initial encounter for closed fracture: Secondary | ICD-10-CM | POA: Diagnosis not present

## 2021-02-28 DIAGNOSIS — R918 Other nonspecific abnormal finding of lung field: Secondary | ICD-10-CM | POA: Diagnosis not present

## 2021-02-28 DIAGNOSIS — S098XXA Other specified injuries of head, initial encounter: Secondary | ICD-10-CM | POA: Diagnosis not present

## 2021-02-28 DIAGNOSIS — Z043 Encounter for examination and observation following other accident: Secondary | ICD-10-CM | POA: Diagnosis not present

## 2021-02-28 DIAGNOSIS — S51019A Laceration without foreign body of unspecified elbow, initial encounter: Secondary | ICD-10-CM | POA: Diagnosis not present

## 2021-03-01 DIAGNOSIS — R29898 Other symptoms and signs involving the musculoskeletal system: Secondary | ICD-10-CM | POA: Diagnosis not present

## 2021-03-01 DIAGNOSIS — M545 Low back pain, unspecified: Secondary | ICD-10-CM | POA: Diagnosis not present

## 2021-03-01 DIAGNOSIS — S2241XA Multiple fractures of ribs, right side, initial encounter for closed fracture: Secondary | ICD-10-CM | POA: Diagnosis not present

## 2021-03-01 DIAGNOSIS — R0689 Other abnormalities of breathing: Secondary | ICD-10-CM | POA: Diagnosis not present

## 2021-03-01 DIAGNOSIS — E876 Hypokalemia: Secondary | ICD-10-CM | POA: Diagnosis not present

## 2021-03-01 DIAGNOSIS — F419 Anxiety disorder, unspecified: Secondary | ICD-10-CM | POA: Diagnosis not present

## 2021-03-01 DIAGNOSIS — G4700 Insomnia, unspecified: Secondary | ICD-10-CM | POA: Diagnosis not present

## 2021-03-01 DIAGNOSIS — R000 Tachycardia, unspecified: Secondary | ICD-10-CM | POA: Diagnosis not present

## 2021-03-01 DIAGNOSIS — M546 Pain in thoracic spine: Secondary | ICD-10-CM | POA: Diagnosis not present

## 2021-03-01 DIAGNOSIS — F32A Depression, unspecified: Secondary | ICD-10-CM | POA: Diagnosis not present

## 2021-03-01 DIAGNOSIS — S22089A Unspecified fracture of T11-T12 vertebra, initial encounter for closed fracture: Secondary | ICD-10-CM | POA: Diagnosis not present

## 2021-03-02 DIAGNOSIS — I509 Heart failure, unspecified: Secondary | ICD-10-CM | POA: Diagnosis not present

## 2021-03-02 DIAGNOSIS — S22089A Unspecified fracture of T11-T12 vertebra, initial encounter for closed fracture: Secondary | ICD-10-CM | POA: Diagnosis not present

## 2021-03-02 DIAGNOSIS — Z8616 Personal history of COVID-19: Secondary | ICD-10-CM | POA: Diagnosis not present

## 2021-03-02 DIAGNOSIS — F32A Depression, unspecified: Secondary | ICD-10-CM | POA: Diagnosis not present

## 2021-03-02 DIAGNOSIS — Z515 Encounter for palliative care: Secondary | ICD-10-CM | POA: Diagnosis not present

## 2021-03-02 DIAGNOSIS — S22089D Unspecified fracture of T11-T12 vertebra, subsequent encounter for fracture with routine healing: Secondary | ICD-10-CM | POA: Diagnosis not present

## 2021-03-02 DIAGNOSIS — F419 Anxiety disorder, unspecified: Secondary | ICD-10-CM | POA: Diagnosis not present

## 2021-03-02 DIAGNOSIS — J449 Chronic obstructive pulmonary disease, unspecified: Secondary | ICD-10-CM | POA: Diagnosis not present

## 2021-03-02 DIAGNOSIS — R2689 Other abnormalities of gait and mobility: Secondary | ICD-10-CM | POA: Diagnosis not present

## 2021-03-02 DIAGNOSIS — S2241XD Multiple fractures of ribs, right side, subsequent encounter for fracture with routine healing: Secondary | ICD-10-CM | POA: Diagnosis not present

## 2021-03-02 DIAGNOSIS — R0689 Other abnormalities of breathing: Secondary | ICD-10-CM | POA: Diagnosis not present

## 2021-03-02 DIAGNOSIS — G8929 Other chronic pain: Secondary | ICD-10-CM | POA: Diagnosis not present

## 2021-03-02 DIAGNOSIS — R000 Tachycardia, unspecified: Secondary | ICD-10-CM | POA: Diagnosis not present

## 2021-03-02 DIAGNOSIS — S2241XA Multiple fractures of ribs, right side, initial encounter for closed fracture: Secondary | ICD-10-CM | POA: Diagnosis not present

## 2021-03-02 DIAGNOSIS — R296 Repeated falls: Secondary | ICD-10-CM | POA: Diagnosis not present

## 2021-03-02 DIAGNOSIS — G4700 Insomnia, unspecified: Secondary | ICD-10-CM | POA: Diagnosis not present

## 2021-03-02 DIAGNOSIS — I110 Hypertensive heart disease with heart failure: Secondary | ICD-10-CM | POA: Diagnosis not present

## 2021-03-03 DIAGNOSIS — F419 Anxiety disorder, unspecified: Secondary | ICD-10-CM | POA: Diagnosis not present

## 2021-03-03 DIAGNOSIS — R0689 Other abnormalities of breathing: Secondary | ICD-10-CM | POA: Diagnosis not present

## 2021-03-03 DIAGNOSIS — I110 Hypertensive heart disease with heart failure: Secondary | ICD-10-CM | POA: Diagnosis not present

## 2021-03-03 DIAGNOSIS — F32A Depression, unspecified: Secondary | ICD-10-CM | POA: Diagnosis not present

## 2021-03-03 DIAGNOSIS — S22089A Unspecified fracture of T11-T12 vertebra, initial encounter for closed fracture: Secondary | ICD-10-CM | POA: Diagnosis not present

## 2021-03-03 DIAGNOSIS — G4700 Insomnia, unspecified: Secondary | ICD-10-CM | POA: Diagnosis not present

## 2021-03-03 DIAGNOSIS — I509 Heart failure, unspecified: Secondary | ICD-10-CM | POA: Diagnosis not present

## 2021-03-03 DIAGNOSIS — G8929 Other chronic pain: Secondary | ICD-10-CM | POA: Diagnosis not present

## 2021-03-03 DIAGNOSIS — S2241XA Multiple fractures of ribs, right side, initial encounter for closed fracture: Secondary | ICD-10-CM | POA: Diagnosis not present

## 2021-03-04 DIAGNOSIS — S81811A Laceration without foreign body, right lower leg, initial encounter: Secondary | ICD-10-CM | POA: Diagnosis not present

## 2021-03-04 DIAGNOSIS — J17 Pneumonia in diseases classified elsewhere: Secondary | ICD-10-CM | POA: Diagnosis not present

## 2021-03-04 DIAGNOSIS — J984 Other disorders of lung: Secondary | ICD-10-CM | POA: Diagnosis not present

## 2021-03-04 DIAGNOSIS — J449 Chronic obstructive pulmonary disease, unspecified: Secondary | ICD-10-CM | POA: Diagnosis not present

## 2021-03-04 DIAGNOSIS — J439 Emphysema, unspecified: Secondary | ICD-10-CM | POA: Diagnosis not present

## 2021-03-04 DIAGNOSIS — R531 Weakness: Secondary | ICD-10-CM | POA: Diagnosis not present

## 2021-03-04 DIAGNOSIS — S22089A Unspecified fracture of T11-T12 vertebra, initial encounter for closed fracture: Secondary | ICD-10-CM | POA: Diagnosis not present

## 2021-03-04 DIAGNOSIS — D62 Acute posthemorrhagic anemia: Secondary | ICD-10-CM | POA: Diagnosis not present

## 2021-03-04 DIAGNOSIS — R296 Repeated falls: Secondary | ICD-10-CM | POA: Diagnosis not present

## 2021-03-04 DIAGNOSIS — M549 Dorsalgia, unspecified: Secondary | ICD-10-CM | POA: Diagnosis not present

## 2021-03-04 DIAGNOSIS — E876 Hypokalemia: Secondary | ICD-10-CM | POA: Diagnosis not present

## 2021-03-04 DIAGNOSIS — N9489 Other specified conditions associated with female genital organs and menstrual cycle: Secondary | ICD-10-CM | POA: Diagnosis not present

## 2021-03-04 DIAGNOSIS — S22080D Wedge compression fracture of T11-T12 vertebra, subsequent encounter for fracture with routine healing: Secondary | ICD-10-CM | POA: Diagnosis not present

## 2021-03-04 DIAGNOSIS — M1990 Unspecified osteoarthritis, unspecified site: Secondary | ICD-10-CM | POA: Diagnosis not present

## 2021-03-04 DIAGNOSIS — W19XXXA Unspecified fall, initial encounter: Secondary | ICD-10-CM | POA: Diagnosis not present

## 2021-03-04 DIAGNOSIS — S2241XD Multiple fractures of ribs, right side, subsequent encounter for fracture with routine healing: Secondary | ICD-10-CM | POA: Diagnosis not present

## 2021-03-04 DIAGNOSIS — Z743 Need for continuous supervision: Secondary | ICD-10-CM | POA: Diagnosis not present

## 2021-03-04 DIAGNOSIS — S80211A Abrasion, right knee, initial encounter: Secondary | ICD-10-CM | POA: Diagnosis not present

## 2021-03-04 DIAGNOSIS — F32A Depression, unspecified: Secondary | ICD-10-CM | POA: Diagnosis not present

## 2021-03-04 DIAGNOSIS — F419 Anxiety disorder, unspecified: Secondary | ICD-10-CM | POA: Diagnosis not present

## 2021-03-04 DIAGNOSIS — S2241XA Multiple fractures of ribs, right side, initial encounter for closed fracture: Secondary | ICD-10-CM | POA: Diagnosis not present

## 2021-03-04 DIAGNOSIS — S2243XA Multiple fractures of ribs, bilateral, initial encounter for closed fracture: Secondary | ICD-10-CM | POA: Diagnosis not present

## 2021-03-04 DIAGNOSIS — I509 Heart failure, unspecified: Secondary | ICD-10-CM | POA: Diagnosis not present

## 2021-03-04 DIAGNOSIS — K449 Diaphragmatic hernia without obstruction or gangrene: Secondary | ICD-10-CM | POA: Diagnosis not present

## 2021-03-04 DIAGNOSIS — I10 Essential (primary) hypertension: Secondary | ICD-10-CM | POA: Diagnosis not present

## 2021-03-04 DIAGNOSIS — R161 Splenomegaly, not elsewhere classified: Secondary | ICD-10-CM | POA: Diagnosis not present

## 2021-03-05 ENCOUNTER — Ambulatory Visit (INDEPENDENT_AMBULATORY_CARE_PROVIDER_SITE_OTHER): Payer: Medicare HMO

## 2021-03-05 ENCOUNTER — Other Ambulatory Visit: Payer: Self-pay

## 2021-03-05 DIAGNOSIS — B9620 Unspecified Escherichia coli [E. coli] as the cause of diseases classified elsewhere: Secondary | ICD-10-CM

## 2021-03-05 DIAGNOSIS — U071 COVID-19: Secondary | ICD-10-CM | POA: Diagnosis not present

## 2021-03-05 DIAGNOSIS — Z6831 Body mass index (BMI) 31.0-31.9, adult: Secondary | ICD-10-CM

## 2021-03-05 DIAGNOSIS — J189 Pneumonia, unspecified organism: Secondary | ICD-10-CM

## 2021-03-05 DIAGNOSIS — D649 Anemia, unspecified: Secondary | ICD-10-CM | POA: Diagnosis not present

## 2021-03-05 DIAGNOSIS — Z9884 Bariatric surgery status: Secondary | ICD-10-CM

## 2021-03-05 DIAGNOSIS — Z9049 Acquired absence of other specified parts of digestive tract: Secondary | ICD-10-CM

## 2021-03-05 DIAGNOSIS — I509 Heart failure, unspecified: Secondary | ICD-10-CM | POA: Diagnosis not present

## 2021-03-05 DIAGNOSIS — J440 Chronic obstructive pulmonary disease with acute lower respiratory infection: Secondary | ICD-10-CM | POA: Diagnosis not present

## 2021-03-05 DIAGNOSIS — D539 Nutritional anemia, unspecified: Secondary | ICD-10-CM

## 2021-03-05 DIAGNOSIS — F419 Anxiety disorder, unspecified: Secondary | ICD-10-CM | POA: Diagnosis not present

## 2021-03-05 DIAGNOSIS — I110 Hypertensive heart disease with heart failure: Secondary | ICD-10-CM | POA: Diagnosis not present

## 2021-03-05 DIAGNOSIS — R52 Pain, unspecified: Secondary | ICD-10-CM | POA: Diagnosis not present

## 2021-03-05 DIAGNOSIS — M549 Dorsalgia, unspecified: Secondary | ICD-10-CM | POA: Diagnosis not present

## 2021-03-05 DIAGNOSIS — F32A Depression, unspecified: Secondary | ICD-10-CM | POA: Diagnosis not present

## 2021-03-05 DIAGNOSIS — R609 Edema, unspecified: Secondary | ICD-10-CM | POA: Diagnosis not present

## 2021-03-05 DIAGNOSIS — Z79899 Other long term (current) drug therapy: Secondary | ICD-10-CM | POA: Diagnosis not present

## 2021-03-05 DIAGNOSIS — N2889 Other specified disorders of kidney and ureter: Secondary | ICD-10-CM | POA: Diagnosis not present

## 2021-03-05 DIAGNOSIS — M25562 Pain in left knee: Secondary | ICD-10-CM

## 2021-03-05 DIAGNOSIS — N390 Urinary tract infection, site not specified: Secondary | ICD-10-CM | POA: Diagnosis not present

## 2021-03-05 DIAGNOSIS — S22080D Wedge compression fracture of T11-T12 vertebra, subsequent encounter for fracture with routine healing: Secondary | ICD-10-CM | POA: Diagnosis not present

## 2021-03-05 DIAGNOSIS — D509 Iron deficiency anemia, unspecified: Secondary | ICD-10-CM | POA: Diagnosis not present

## 2021-03-05 DIAGNOSIS — I10 Essential (primary) hypertension: Secondary | ICD-10-CM | POA: Diagnosis not present

## 2021-03-05 DIAGNOSIS — M25561 Pain in right knee: Secondary | ICD-10-CM

## 2021-03-05 DIAGNOSIS — K219 Gastro-esophageal reflux disease without esophagitis: Secondary | ICD-10-CM

## 2021-03-05 DIAGNOSIS — M1990 Unspecified osteoarthritis, unspecified site: Secondary | ICD-10-CM | POA: Diagnosis not present

## 2021-03-05 DIAGNOSIS — R5381 Other malaise: Secondary | ICD-10-CM | POA: Diagnosis not present

## 2021-03-05 DIAGNOSIS — I609 Nontraumatic subarachnoid hemorrhage, unspecified: Secondary | ICD-10-CM

## 2021-03-05 DIAGNOSIS — J439 Emphysema, unspecified: Secondary | ICD-10-CM | POA: Diagnosis not present

## 2021-03-05 DIAGNOSIS — G8929 Other chronic pain: Secondary | ICD-10-CM

## 2021-03-05 DIAGNOSIS — S2241XD Multiple fractures of ribs, right side, subsequent encounter for fracture with routine healing: Secondary | ICD-10-CM | POA: Diagnosis not present

## 2021-03-05 DIAGNOSIS — R42 Dizziness and giddiness: Secondary | ICD-10-CM | POA: Diagnosis not present

## 2021-03-05 DIAGNOSIS — J449 Chronic obstructive pulmonary disease, unspecified: Secondary | ICD-10-CM | POA: Diagnosis not present

## 2021-03-05 DIAGNOSIS — E669 Obesity, unspecified: Secondary | ICD-10-CM

## 2021-03-06 DIAGNOSIS — M549 Dorsalgia, unspecified: Secondary | ICD-10-CM | POA: Diagnosis not present

## 2021-03-06 DIAGNOSIS — R5381 Other malaise: Secondary | ICD-10-CM | POA: Diagnosis not present

## 2021-03-06 DIAGNOSIS — R609 Edema, unspecified: Secondary | ICD-10-CM | POA: Diagnosis not present

## 2021-03-06 DIAGNOSIS — J449 Chronic obstructive pulmonary disease, unspecified: Secondary | ICD-10-CM | POA: Diagnosis not present

## 2021-03-08 DIAGNOSIS — J189 Pneumonia, unspecified organism: Secondary | ICD-10-CM | POA: Diagnosis not present

## 2021-03-09 DIAGNOSIS — R42 Dizziness and giddiness: Secondary | ICD-10-CM | POA: Diagnosis not present

## 2021-03-09 DIAGNOSIS — Z79899 Other long term (current) drug therapy: Secondary | ICD-10-CM | POA: Diagnosis not present

## 2021-03-09 DIAGNOSIS — R52 Pain, unspecified: Secondary | ICD-10-CM | POA: Diagnosis not present

## 2021-03-12 ENCOUNTER — Telehealth: Payer: Medicare HMO

## 2021-03-12 ENCOUNTER — Telehealth: Payer: Self-pay

## 2021-03-12 NOTE — Chronic Care Management (AMB) (Signed)
  Care Management   Note  03/12/2021 Name: [name] MRN: [id number] DOB: October 21, 1950  Brittany Conrad is a 70 y.o. year old female who is a primary care patient of Stacks, Broadus John, MD and is actively engaged with the care management team. I reached out to Ronnette Juniper by phone today to assist with re-scheduling a follow up visit with the RN Case Manager  Follow up plan: Unsuccessful telephone outreach attempt made.  The care management team will reach out to the patient again over the next 7 days.  If patient returns call to provider office, please advise to call Embedded Care Management Care Guide Penne Lash  at [phone]  Penne Lash, RMA Care Guide, Embedded Care Coordination [facility]  [place], [place] [address] Direct Dial: [phone] [email or web address] Website: [email or web address]

## 2021-03-13 ENCOUNTER — Other Ambulatory Visit: Payer: Self-pay | Admitting: Family Medicine

## 2021-03-13 ENCOUNTER — Telehealth: Payer: Self-pay | Admitting: Family Medicine

## 2021-03-13 DIAGNOSIS — R52 Pain, unspecified: Secondary | ICD-10-CM | POA: Diagnosis not present

## 2021-03-13 DIAGNOSIS — Z79899 Other long term (current) drug therapy: Secondary | ICD-10-CM | POA: Diagnosis not present

## 2021-03-13 DIAGNOSIS — J449 Chronic obstructive pulmonary disease, unspecified: Secondary | ICD-10-CM | POA: Diagnosis not present

## 2021-03-13 DIAGNOSIS — R42 Dizziness and giddiness: Secondary | ICD-10-CM | POA: Diagnosis not present

## 2021-03-13 NOTE — Telephone Encounter (Signed)
Spoke with patient, appointment scheduled for 03/17/21.

## 2021-03-17 ENCOUNTER — Ambulatory Visit (INDEPENDENT_AMBULATORY_CARE_PROVIDER_SITE_OTHER): Payer: Medicare HMO | Admitting: Family Medicine

## 2021-03-17 ENCOUNTER — Other Ambulatory Visit: Payer: Self-pay

## 2021-03-17 ENCOUNTER — Encounter: Payer: Self-pay | Admitting: Family Medicine

## 2021-03-17 DIAGNOSIS — J449 Chronic obstructive pulmonary disease, unspecified: Secondary | ICD-10-CM

## 2021-03-17 MED ORDER — CLONAZEPAM 0.5 MG PO TABS: 0.5000 mg | ORAL_TABLET | Freq: Every day | ORAL | Status: DC

## 2021-03-17 MED ORDER — AMLODIPINE BESYLATE 10 MG PO TABS: 10.0000 mg | ORAL_TABLET | Freq: Every day | ORAL | 3 refills | Status: DC

## 2021-03-17 MED ORDER — ALBUTEROL SULFATE (2.5 MG/3ML) 0.083% IN NEBU: INHALATION_SOLUTION | RESPIRATORY_TRACT | 11 refills | Status: DC

## 2021-03-17 NOTE — Progress Notes (Signed)
Subjective:  Patient ID: Ronnette Juniper, female    DOB: [birth year]  Age: 70 y.o. MRN: [id number]  CC: Hospitalization Follow-up   HPI Elisea Khader presents for Had multiple falls. Admitted postfall to [facility]. She had had Covid just before this. Started fall ing a lot.   Depression screen [facility] 2/9 03/17/2021 03/17/2021 11/20/2020  Decreased Interest 1 0 1  Down, Depressed, Hopeless 2 0 1  PHQ - 2 Score 3 0 2  Altered sleeping 1 - 2  Tired, decreased energy 1 - 2  Change in appetite 1 - 1  Feeling bad or failure about yourself  0 - 1  Trouble concentrating 1 - 1  Moving slowly or fidgety/restless 1 - 0  Suicidal thoughts 0 - 0  PHQ-9 Score 8 - 9  Difficult doing work/chores Not difficult at all - Somewhat difficult    History Drisana has a past medical history of Asthma, Bipolar 1 disorder (HCC), COPD (chronic obstructive pulmonary disease) (HCC), Emphysema lung (HCC), Emphysema of lung (HCC), Neuropathy, and Scoliosis.   She has a past surgical history that includes Abdominal hysterectomy; Gallbladder surgery; Breast reduction surgery; tummy tuck; Back surgery; and Gastric bypass.   Her family history includes Alzheimer's disease in her maternal grandmother; Bipolar disorder in her daughter; Cancer in her father, mother, and paternal grandmother; Stroke in her paternal grandfather; Tuberculosis in her maternal grandfather.She reports that she quit smoking about 4 years ago. Her smoking use included cigarettes. She has a 70.00 pack-year smoking history. She has never used smokeless tobacco. She reports current alcohol use. She reports that she does not use drugs.    ROS Review of Systems  Constitutional: Negative.   HENT: Negative.    Eyes:  Negative for visual disturbance.  Respiratory:  Negative for shortness of breath.   Cardiovascular:  Negative for chest pain.  Gastrointestinal:  Negative for abdominal pain.  Musculoskeletal:  Negative for arthralgias.   Objective:  BP  116/71   Pulse (!) 107   Temp 98 F (36.7 C)   Ht 5\' 2"  (1.575 m)   Wt 162 lb 12.8 oz (73.8 kg)   SpO2 97%   BMI 29.78 kg/m   BP Readings from Last 3 Encounters:  03/17/21 116/71  10/23/20 127/86  09/16/20 113/78    Wt Readings from Last 3 Encounters:  03/17/21 162 lb 12.8 oz (73.8 kg)  11/20/20 174 lb (78.9 kg)  10/23/20 174 lb (78.9 kg)     Physical Exam Constitutional:      General: She is not in acute distress.    Appearance: She is well-developed.  Cardiovascular:     Rate and Rhythm: Normal rate and regular rhythm.  Pulmonary:     Breath sounds: Normal breath sounds.  Musculoskeletal:        General: Normal range of motion.  Skin:    General: Skin is warm and dry.  Neurological:     Mental Status: She is alert and oriented to person, place, and time.      Assessment & Plan:   Estoria was seen today for hospitalization follow-up.  Diagnoses and all orders for this visit:  Chronic obstructive pulmonary disease, unspecified COPD type (HCC) -     albuterol (PROVENTIL) (2.5 MG/3ML) 0.083% nebulizer solution; INHALE 3 ML BY NEBULIZATION EVERY 6 HOURS AS NEEDED FOR WHEEZING OR SHORTNESS OF BREATH  Other orders -     amLODipine (NORVASC) 10 MG tablet; Take 1 tablet (10 mg total) by mouth daily. -  clonazePAM (KLONOPIN) 0.5 MG tablet; Take 1 tablet (0.5 mg total) by mouth at bedtime.      I have discontinued Ines Bloomer Potassium Chloride (KLOR-CON PO), amLODipine, dextromethorphan-guaiFENesin, and cloNIDine. I have also changed her amLODipine and clonazePAM. Additionally, I am having her maintain her ibuprofen, CVS D3, HYDROcodone-acetaminophen, hydrOXYzine, pantoprazole, gabapentin, Xarelto, amitriptyline, OLANZapine, busPIRone, diclofenac Sodium, sucralfate, doxycycline, fluticasone, ipratropium, benzonatate, Klor-Con M20, albuterol, furosemide, and albuterol.  Allergies as of 03/17/2021       Reactions   Tramadol    Can't take d/t pain clinic         Medication List        Accurate as of March 17, 2021 11:59 PM. If you have any questions, ask your nurse or doctor.          STOP taking these medications    cloNIDine 0.1 MG tablet Commonly known as: CATAPRES Stopped by: Mechele Claude, MD   dextromethorphan-guaiFENesin 30-600 MG 12hr tablet Commonly known as: MUCINEX DM Stopped by: Mechele Claude, MD   KLOR-CON PO Stopped by: Mechele Claude, MD       TAKE these medications    albuterol 108 (90 Base) MCG/ACT inhaler Commonly known as: VENTOLIN HFA INHALE 1-2 PUFFS BY MOUTH EVERY 6 HOURS AS NEEDED FOR WHEEZE OR SHORTNESS OF BREATH   albuterol (2.5 MG/3ML) 0.083% nebulizer solution Commonly known as: PROVENTIL INHALE 3 ML BY NEBULIZATION EVERY 6 HOURS AS NEEDED FOR WHEEZING OR SHORTNESS OF BREATH   amitriptyline 100 MG tablet Commonly known as: ELAVIL Take 2 tablets (200 mg total) by mouth at bedtime. (Needs to be seen before next refill)   amLODipine 10 MG tablet Commonly known as: NORVASC Take 1 tablet (10 mg total) by mouth daily. What changed:  medication strength See the new instructions. Another medication with the same name was removed. Continue taking this medication, and follow the directions you see here. Changed by: Mechele Claude, MD   benzonatate 100 MG capsule Commonly known as: TESSALON Take 1 capsule (100 mg total) by mouth 2 (two) times daily as needed for cough.   busPIRone 10 MG tablet Commonly known as: BUSPAR Take 1 tablet (10 mg total) by mouth 3 (three) times daily.   clonazePAM 0.5 MG tablet Commonly known as: KLONOPIN Take 1 tablet (0.5 mg total) by mouth at bedtime. What changed:  how much to take when to take this Changed by: Mechele Claude, MD   CVS D3 25 MCG (1000 UT) capsule Generic drug: Cholecalciferol TAKE 1 CAPSULE (1,000 UNITS TOTAL) BY MOUTH DAILY.   diclofenac Sodium 1 % Gel Commonly known as: VOLTAREN   doxycycline 100 MG capsule Commonly known as:  VIBRAMYCIN Take by mouth.   fluticasone 50 MCG/ACT nasal spray Commonly known as: FLONASE 1 spray into each nostril in the morning.   furosemide 40 MG tablet Commonly known as: LASIX TAKE 1 TABLET BY MOUTH TWICE A DAY   gabapentin 400 MG capsule Commonly known as: NEURONTIN Take 400 mg by mouth 3 (three) times daily.   HYDROcodone-acetaminophen 7.5-325 MG tablet Commonly known as: NORCO Take 1 tablet by mouth every 6 (six) hours as needed for moderate pain.   hydrOXYzine 25 MG capsule Commonly known as: VISTARIL Take 1 capsule (25 mg total) by mouth at bedtime as needed (insomnia).   ibuprofen 800 MG tablet Commonly known as: ADVIL Take 800 mg by mouth 3 (three) times daily as needed.   ipratropium 0.02 % nebulizer solution Commonly known as: ATROVENT Take 2.5 mLs (0.5  mg total) by nebulization 4 (four) times daily.   Klor-Con M20 20 MEQ tablet Generic drug: potassium chloride SA TAKE 2 TABLETS (40 MEQ TOTAL) BY MOUTH 3 (THREE) TIMES DAILY. FOR POTASSIUM REPLACEMENT/ SUPPLEMENT   OLANZapine 10 MG tablet Commonly known as: ZYPREXA Take 1 tablet (10 mg total) by mouth at bedtime.   pantoprazole 40 MG tablet Commonly known as: PROTONIX TAKE 1 TABLET BY MOUTH TWICE A DAY   sucralfate 1 g tablet Commonly known as: CARAFATE   Xarelto 10 MG Tabs tablet Generic drug: rivaroxaban         Follow-up: No follow-ups on file.  Mechele Claude, M.D.

## 2021-03-18 ENCOUNTER — Encounter: Payer: Self-pay | Admitting: Family Medicine

## 2021-03-18 DIAGNOSIS — G609 Hereditary and idiopathic neuropathy, unspecified: Secondary | ICD-10-CM | POA: Diagnosis not present

## 2021-03-18 DIAGNOSIS — M25561 Pain in right knee: Secondary | ICD-10-CM | POA: Diagnosis not present

## 2021-03-18 DIAGNOSIS — M25562 Pain in left knee: Secondary | ICD-10-CM | POA: Diagnosis not present

## 2021-03-18 DIAGNOSIS — Z79899 Other long term (current) drug therapy: Secondary | ICD-10-CM | POA: Diagnosis not present

## 2021-03-19 NOTE — Chronic Care Management (AMB) (Signed)
  Care Management   Note  03/19/2021 Name: Ruthellen Tippy MRN: [id number] DOB: 10-Nov-1950  [name] is a 70 y.o. year old female who is a primary care patient of Stacks, Broadus John, MD and is actively engaged with the care management team. I reached out to Ronnette Juniper by phone today to assist with re-scheduling a follow up visit with the RN Case Manager  Follow up plan: Telephone appointment with care management team member scheduled for:03/23/2021  Penne Lash, RMA Care Guide, Embedded Care Coordination [facility]  [place], [place] [address] Direct Dial: [phone] [email or web address]: [email or web address]

## 2021-03-23 ENCOUNTER — Ambulatory Visit (INDEPENDENT_AMBULATORY_CARE_PROVIDER_SITE_OTHER): Payer: Medicare HMO | Admitting: *Deleted

## 2021-03-23 ENCOUNTER — Telehealth: Payer: Self-pay | Admitting: *Deleted

## 2021-03-23 DIAGNOSIS — J449 Chronic obstructive pulmonary disease, unspecified: Secondary | ICD-10-CM

## 2021-03-23 DIAGNOSIS — R296 Repeated falls: Secondary | ICD-10-CM

## 2021-03-23 DIAGNOSIS — I10 Essential (primary) hypertension: Secondary | ICD-10-CM

## 2021-03-23 DIAGNOSIS — I509 Heart failure, unspecified: Secondary | ICD-10-CM

## 2021-03-23 NOTE — Patient Instructions (Signed)
Visit Information  PATIENT GOALS:  Goals Addressed             This Visit's Progress    Monitor and Manage Blood Pressure   On track    Timeframe:  Long-Range Goal Priority:  Medium Start Date:        02/20/21                     Expected End Date:                       Follow-up:04/22/21  Check and record blood pressure daily Call PCP with any readings outside of recommended range Keep all medical appointments Take medications as prescribed Take blood pressure log to all PCP and cardiology visits Call RN Care Manager as needed [phone]      Monitor and Manage CHF   On track    Timeframe:  Long-Range Goal Priority:  Medium Start Date:     02/20/21                        Expected End Date:                       Follow-up: 04/22/21  Weigh each morning after urinating and record Call PCP or cardiologist with any weight gain of more than 3 lbs overnight or more than 5 lbs in one week Take medications as prescribed Keep all medical appointments Limit salt/sodium intake Call RN Care Manager as needed [phone]      Prevent Falls and Injury   On track    Timeframe:  Long-Range Goal Priority:  Medium Start Date:  11/03/20                           Expected End Date: 11/03/21                       Follow Up Date 04/22/21    keep my cell phone with me always use a nonslip pad with throw rugs, or remove them completely use a cane or walker Keep a clear path through your home Move carefully and change positions slowly to avoid falls Consider vestibular rehab for vertigo   Why is this important?   Most falls happen when it is hard for you to walk safely. Your balance may be off because of an illness. You may have pain in your knees, hip or other joints.  You may be overly tired or taking medicines that make you sleepy. You may not be able to see or hear clearly.  Falls can lead to broken bones, bruises or other injuries.  There are things you can do to help prevent  falling.     Notes:      Track and Manage My Symptoms-COPD   On track    Timeframe:  Long-Range Goal Priority:  Medium Start Date:  11/03/20                          Expected End Date:                       Follow Up Date 04/22/21   bring symptom diary to all visits follow rescue plan if symptoms flare-up Seek appropriate medical attention for any new or worsening symptoms Talk with PCP  regarding COPD management and possibly getting a maintenance inhaler eliminate symptom triggers at home keep follow-up appointments use an extra pillow to sleep   Call RN Care Manager at [phone]   Why is this important?   Tracking your symptoms and other information about your health helps your doctor plan your care.  Write down the symptoms, the time of day, what you were doing and what medicine you are taking.  You will soon learn how to manage your symptoms.     Notes:         The patient verbalized understanding of instructions, educational materials, and care plan provided today and declined offer to receive copy of patient instructions, educational materials, and care plan.   Telephone follow up appointment with care management team member scheduled for: 04/22/21 with RNCM  Demetrios Loll, BSN, RN-BC Embedded Chronic Care Manager [facility] / [facility] Care Management Direct Dial: [phone]

## 2021-03-23 NOTE — Telephone Encounter (Signed)
03/23/2021  Patient requesting refill on mediation for vertigo. Was given this at SNF but she isn't sure of the name and it isn't on her medication list. She did find it helpful.   Discussed vertigo and how it relates to her falls. She is interested in vestibular rehab at [facility] if PCP thinks it is appropriate.   Forwarding to PCP for review and response.   Demetrios Loll, BSN, RN-BC Embedded Chronic Care Manager [facility] / [facility] Care Management Direct Dial: [phone]

## 2021-03-23 NOTE — Chronic Care Management (AMB) (Signed)
Chronic Care Management   CCM RN Visit Note  03/23/2021 Name: Brittany Conrad MRN: [id number] DOB: May 16, 1951  Subjective: Brittany Conrad is a 70 y.o. year old female who is a primary care patient of Stacks, Broadus John, MD. The care management team was consulted for assistance with disease management and care coordination needs.    Engaged with patient by telephone for follow up visit in response to provider referral for case management and/or care coordination services.   Consent to Services:  The patient was given information about Chronic Care Management services, agreed to services, and gave verbal consent prior to initiation of services.  Please see initial visit note for detailed documentation.   Patient agreed to services and verbal consent obtained.   Assessment: Review of patient past medical history, allergies, medications, health status, including review of consultants reports, laboratory and other test data, was performed as part of comprehensive evaluation and provision of chronic care management services.   SDOH (Social Determinants of Health) assessments and interventions performed:    CCM Care Plan  Allergies  Allergen Reactions   Tramadol     Can't take d/t pain clinic    Outpatient Encounter Medications as of 03/23/2021  Medication Sig Note   albuterol (PROVENTIL) (2.5 MG/3ML) 0.083% nebulizer solution INHALE 3 ML BY NEBULIZATION EVERY 6 HOURS AS NEEDED FOR WHEEZING OR SHORTNESS OF BREATH    albuterol (VENTOLIN HFA) 108 (90 Base) MCG/ACT inhaler INHALE 1-2 PUFFS BY MOUTH EVERY 6 HOURS AS NEEDED FOR WHEEZE OR SHORTNESS OF BREATH    amitriptyline (ELAVIL) 100 MG tablet Take 2 tablets (200 mg total) by mouth at bedtime. (Needs to be seen before next refill)    amLODipine (NORVASC) 10 MG tablet Take 1 tablet (10 mg total) by mouth daily.    benzonatate (TESSALON) 100 MG capsule Take 1 capsule (100 mg total) by mouth 2 (two) times daily as needed for cough.    busPIRone  (BUSPAR) 10 MG tablet Take 1 tablet (10 mg total) by mouth 3 (three) times daily.    clonazePAM (KLONOPIN) 0.5 MG tablet Take 1 tablet (0.5 mg total) by mouth at bedtime.    [facility] D3 25 MCG (1000 UT) capsule TAKE 1 CAPSULE (1,000 UNITS TOTAL) BY MOUTH DAILY.    diclofenac Sodium (VOLTAREN) 1 % GEL     doxycycline (VIBRAMYCIN) 100 MG capsule Take by mouth.    fluticasone (FLONASE) 50 MCG/ACT nasal spray 1 spray into each nostril in the morning.    furosemide (LASIX) 40 MG tablet TAKE 1 TABLET BY MOUTH TWICE A DAY    gabapentin (NEURONTIN) 400 MG capsule Take 400 mg by mouth 3 (three) times daily.    HYDROcodone-acetaminophen (NORCO) 7.5-325 MG tablet Take 1 tablet by mouth every 6 (six) hours as needed for moderate pain. 11/20/2020: Taking QID   hydrOXYzine (VISTARIL) 25 MG capsule Take 1 capsule (25 mg total) by mouth at bedtime as needed (insomnia).    ibuprofen (ADVIL,MOTRIN) 800 MG tablet Take 800 mg by mouth 3 (three) times daily as needed.     ipratropium (ATROVENT) 0.02 % nebulizer solution Take 2.5 mLs (0.5 mg total) by nebulization 4 (four) times daily.    KLOR-CON M20 20 MEQ tablet TAKE 2 TABLETS (40 MEQ TOTAL) BY MOUTH 3 (THREE) TIMES DAILY. FOR POTASSIUM REPLACEMENT/ SUPPLEMENT    OLANZapine (ZYPREXA) 10 MG tablet Take 1 tablet (10 mg total) by mouth at bedtime.    pantoprazole (PROTONIX) 40 MG tablet TAKE 1 TABLET BY MOUTH TWICE A DAY  sucralfate (CARAFATE) 1 g tablet     XARELTO 10 MG TABS tablet     No facility-administered encounter medications on file as of 03/23/2021.    Patient Active Problem List   Diagnosis Date Noted   Arthralgia of both knees 02/09/2021   Frequent falls 02/09/2021   Urinary tract infection without hematuria 02/09/2021   Cough 12/25/2020   Hospital discharge follow-up 12/10/2020   Leukocytosis 11/29/2020   Pneumonia 11/29/2020   Sepsis (HCC) 11/29/2020   Chronic congestive heart failure, unspecified heart failure type (HCC) 10/23/2020   Essential  hypertension 10/23/2020   Benzodiazepine dependence, continuous (HCC) 10/23/2020   Chronic pain syndrome 10/23/2020   Acute respiratory failure (HCC) 09/07/2020   MDD (major depressive disorder), recurrent episode, mild (HCC) 09/07/2019   GAD (generalized anxiety disorder) 03/01/2019   Normocytic anemia 04/13/2018   COPD (chronic obstructive pulmonary disease) (HCC) 04/13/2018    Conditions to be addressed/monitored:CHF, HTN, COPD, and fall risk/vertigo  Care Plan : [facility] Care Plan  Updates made by Gwenith Daily, RN since 03/23/2021 12:00 AM     Problem: Chronic Disease Management Needs   Priority: Medium     Long-Range Goal: Work with [facility] Regarding Care Management and Care Coordination Associated with Chronic Disease States   Start Date: 02/20/2021  Recent Progress: On track  Priority: Medium  Note:   Current Barriers:  Chronic Disease Management support and education needs related to CHF, HTN, and COPD Transportation barriers  RNCM Clinical Goal(s):  Patient will continue to work with RN Care Manager to address care management and care coordination needs related to CHF, HTN, and COPD  through collaboration with RN Care manager, provider, and care team.   Interventions: 1:1 collaboration with primary care provider regarding development and update of comprehensive plan of care as evidenced by provider attestation and co-signature Inter-disciplinary care team collaboration (see longitudinal plan of care) Evaluation of current treatment plan related to  self management and patient's adherence to plan as established by provider   Heart Failure Interventions:  (Status: Goal on track: YES.) Provided education on low sodium diet; Advised patient to weigh each morning after emptying bladder; Discussed importance of daily weight and advised patient to weigh and record daily; Reviewed role of diuretics in prevention of fluid overload and management of heart failure; Discussed the  importance of keeping all appointments with provider; Provided patient with education about the role of exercise in the management of heart failure; Assessed social determinant of health barriers;   COPD: (Status: Goal on track: YES.) Advised patient to track and manage COPD triggers;  Provided instruction about proper use of medications used for management of COPD including inhalers; Advised patient to self assesses COPD action plan zone and make appointment with provider if in the yellow zone for 48 hours without improvement; Advised patient to engage in light exercise as tolerated 3-5 days a week to aid in the the management of COPD; Provided education about and advised patient to utilize infection prevention strategies to reduce risk of respiratory infection; Discussed the importance of adequate rest and management of fatigue with COPD; Discussed recent hospitalization for pneumonia and discharge on 02/16/21 Provided with RNCM contact number and encouraged to reach out as needed  Falls:  (Status: Goal on track: YES.) Reviewed medications and discussed potential side effects of medications such as dizziness and frequent urination Advised patient of importance of notifying provider of falls Assessed for falls since last encounter Assessed patients knowledge of fall risk prevention secondary to  previously provided education Provided patient information for fall alert systems Discussed use of assistive devices. Currently using cane  Discussed vertigo and the role it plays in falls Telephone message sent to PCP requesting refill of medication to treat vertigo. Nothing on medication list but patient took it at [facility] and said that it helped. Advised that it may cause drowsiness.  Discussed that vestibular rehab may be beneficial if the vertigo is due to an inner ear problem Requested referral from PCP to vestibular rehab at [facility] for evaluation. Assess transportation barrier. Patient reports  that she can get to the appointments.   Hypertension: (Status: Goal on track: YES.) Last practice recorded BP readings:  BP Readings from Last 3 Encounters:  03/17/21 116/71  10/23/20 127/86  09/16/20 113/78  Evaluation of current treatment plan related to hypertension self management and patient's adherence to plan as established by provider;   Reviewed medications with patient and discussed importance of compliance;  Counseled on the importance of exercise goals with target of 150 minutes per week Discussed plans with patient for ongoing care management follow up and provided patient with direct contact information for care management team; Advised patient, providing education and rationale, to monitor blood pressure daily and record, calling PCP for findings outside established parameters;  Discussed complications of poorly controlled blood pressure such as heart disease, stroke, circulatory complications, vision complications, kidney impairment, sexual dysfunction;    Patient Goals/Self-Care Activities: Patient will self administer medications as prescribed Patient will attend all scheduled provider appointments Patient will call pharmacy for medication refills Patient will continue to perform ADL's independently Patient will continue to perform IADL's independently Patient will call provider office for new concerns or questions       Plan:Telephone follow up appointment with care management team member scheduled for:  04/22/21 and The patient has been provided with contact information for the care management team and has been advised to call with any health related questions or concerns.   Demetrios Loll, BSN, RN-BC Embedded Chronic Care Manager [facility] / [facility] Care Management Direct Dial: [phone]

## 2021-03-24 DIAGNOSIS — J440 Chronic obstructive pulmonary disease with acute lower respiratory infection: Secondary | ICD-10-CM | POA: Diagnosis not present

## 2021-03-24 DIAGNOSIS — F419 Anxiety disorder, unspecified: Secondary | ICD-10-CM | POA: Diagnosis not present

## 2021-03-24 DIAGNOSIS — B9620 Unspecified Escherichia coli [E. coli] as the cause of diseases classified elsewhere: Secondary | ICD-10-CM | POA: Diagnosis not present

## 2021-03-24 DIAGNOSIS — J189 Pneumonia, unspecified organism: Secondary | ICD-10-CM | POA: Diagnosis not present

## 2021-03-24 DIAGNOSIS — D539 Nutritional anemia, unspecified: Secondary | ICD-10-CM | POA: Diagnosis not present

## 2021-03-24 DIAGNOSIS — I110 Hypertensive heart disease with heart failure: Secondary | ICD-10-CM | POA: Diagnosis not present

## 2021-03-24 DIAGNOSIS — U071 COVID-19: Secondary | ICD-10-CM | POA: Diagnosis not present

## 2021-03-24 DIAGNOSIS — N390 Urinary tract infection, site not specified: Secondary | ICD-10-CM | POA: Diagnosis not present

## 2021-03-24 DIAGNOSIS — I509 Heart failure, unspecified: Secondary | ICD-10-CM | POA: Diagnosis not present

## 2021-03-24 NOTE — Telephone Encounter (Signed)
Pt received list of medications that she was prescribed from the SNF in the mail today and she will bring this by so that we can get the name of the medication that she is wanting filled.

## 2021-03-26 DIAGNOSIS — I509 Heart failure, unspecified: Secondary | ICD-10-CM | POA: Diagnosis not present

## 2021-03-26 DIAGNOSIS — N390 Urinary tract infection, site not specified: Secondary | ICD-10-CM | POA: Diagnosis not present

## 2021-03-26 DIAGNOSIS — B9620 Unspecified Escherichia coli [E. coli] as the cause of diseases classified elsewhere: Secondary | ICD-10-CM | POA: Diagnosis not present

## 2021-03-26 DIAGNOSIS — I110 Hypertensive heart disease with heart failure: Secondary | ICD-10-CM | POA: Diagnosis not present

## 2021-03-26 DIAGNOSIS — F419 Anxiety disorder, unspecified: Secondary | ICD-10-CM | POA: Diagnosis not present

## 2021-03-26 DIAGNOSIS — J189 Pneumonia, unspecified organism: Secondary | ICD-10-CM | POA: Diagnosis not present

## 2021-03-26 DIAGNOSIS — U071 COVID-19: Secondary | ICD-10-CM | POA: Diagnosis not present

## 2021-03-26 DIAGNOSIS — D539 Nutritional anemia, unspecified: Secondary | ICD-10-CM | POA: Diagnosis not present

## 2021-03-26 DIAGNOSIS — J440 Chronic obstructive pulmonary disease with acute lower respiratory infection: Secondary | ICD-10-CM | POA: Diagnosis not present

## 2021-03-30 DIAGNOSIS — B9620 Unspecified Escherichia coli [E. coli] as the cause of diseases classified elsewhere: Secondary | ICD-10-CM | POA: Diagnosis not present

## 2021-03-30 DIAGNOSIS — I509 Heart failure, unspecified: Secondary | ICD-10-CM | POA: Diagnosis not present

## 2021-03-30 DIAGNOSIS — J189 Pneumonia, unspecified organism: Secondary | ICD-10-CM | POA: Diagnosis not present

## 2021-03-30 DIAGNOSIS — U071 COVID-19: Secondary | ICD-10-CM | POA: Diagnosis not present

## 2021-03-30 DIAGNOSIS — F419 Anxiety disorder, unspecified: Secondary | ICD-10-CM | POA: Diagnosis not present

## 2021-03-30 DIAGNOSIS — J440 Chronic obstructive pulmonary disease with acute lower respiratory infection: Secondary | ICD-10-CM | POA: Diagnosis not present

## 2021-03-30 DIAGNOSIS — I110 Hypertensive heart disease with heart failure: Secondary | ICD-10-CM | POA: Diagnosis not present

## 2021-03-30 DIAGNOSIS — D539 Nutritional anemia, unspecified: Secondary | ICD-10-CM | POA: Diagnosis not present

## 2021-03-30 DIAGNOSIS — N390 Urinary tract infection, site not specified: Secondary | ICD-10-CM | POA: Diagnosis not present

## 2021-04-02 DIAGNOSIS — I110 Hypertensive heart disease with heart failure: Secondary | ICD-10-CM | POA: Diagnosis not present

## 2021-04-02 DIAGNOSIS — N390 Urinary tract infection, site not specified: Secondary | ICD-10-CM | POA: Diagnosis not present

## 2021-04-02 DIAGNOSIS — D539 Nutritional anemia, unspecified: Secondary | ICD-10-CM | POA: Diagnosis not present

## 2021-04-02 DIAGNOSIS — U071 COVID-19: Secondary | ICD-10-CM | POA: Diagnosis not present

## 2021-04-02 DIAGNOSIS — B9620 Unspecified Escherichia coli [E. coli] as the cause of diseases classified elsewhere: Secondary | ICD-10-CM | POA: Diagnosis not present

## 2021-04-02 DIAGNOSIS — F419 Anxiety disorder, unspecified: Secondary | ICD-10-CM | POA: Diagnosis not present

## 2021-04-02 DIAGNOSIS — J440 Chronic obstructive pulmonary disease with acute lower respiratory infection: Secondary | ICD-10-CM | POA: Diagnosis not present

## 2021-04-02 DIAGNOSIS — I509 Heart failure, unspecified: Secondary | ICD-10-CM | POA: Diagnosis not present

## 2021-04-02 DIAGNOSIS — J189 Pneumonia, unspecified organism: Secondary | ICD-10-CM | POA: Diagnosis not present

## 2021-04-03 DIAGNOSIS — I509 Heart failure, unspecified: Secondary | ICD-10-CM | POA: Diagnosis not present

## 2021-04-03 DIAGNOSIS — I10 Essential (primary) hypertension: Secondary | ICD-10-CM

## 2021-04-03 DIAGNOSIS — J449 Chronic obstructive pulmonary disease, unspecified: Secondary | ICD-10-CM | POA: Diagnosis not present

## 2021-04-07 DIAGNOSIS — F419 Anxiety disorder, unspecified: Secondary | ICD-10-CM | POA: Diagnosis not present

## 2021-04-07 DIAGNOSIS — N390 Urinary tract infection, site not specified: Secondary | ICD-10-CM | POA: Diagnosis not present

## 2021-04-07 DIAGNOSIS — U071 COVID-19: Secondary | ICD-10-CM | POA: Diagnosis not present

## 2021-04-07 DIAGNOSIS — I509 Heart failure, unspecified: Secondary | ICD-10-CM | POA: Diagnosis not present

## 2021-04-07 DIAGNOSIS — J440 Chronic obstructive pulmonary disease with acute lower respiratory infection: Secondary | ICD-10-CM | POA: Diagnosis not present

## 2021-04-07 DIAGNOSIS — I110 Hypertensive heart disease with heart failure: Secondary | ICD-10-CM | POA: Diagnosis not present

## 2021-04-07 DIAGNOSIS — B9620 Unspecified Escherichia coli [E. coli] as the cause of diseases classified elsewhere: Secondary | ICD-10-CM | POA: Diagnosis not present

## 2021-04-07 DIAGNOSIS — D539 Nutritional anemia, unspecified: Secondary | ICD-10-CM | POA: Diagnosis not present

## 2021-04-07 DIAGNOSIS — J189 Pneumonia, unspecified organism: Secondary | ICD-10-CM | POA: Diagnosis not present

## 2021-04-08 DIAGNOSIS — J440 Chronic obstructive pulmonary disease with acute lower respiratory infection: Secondary | ICD-10-CM | POA: Diagnosis not present

## 2021-04-08 DIAGNOSIS — U071 COVID-19: Secondary | ICD-10-CM | POA: Diagnosis not present

## 2021-04-08 DIAGNOSIS — B9620 Unspecified Escherichia coli [E. coli] as the cause of diseases classified elsewhere: Secondary | ICD-10-CM | POA: Diagnosis not present

## 2021-04-08 DIAGNOSIS — F419 Anxiety disorder, unspecified: Secondary | ICD-10-CM | POA: Diagnosis not present

## 2021-04-08 DIAGNOSIS — J189 Pneumonia, unspecified organism: Secondary | ICD-10-CM | POA: Diagnosis not present

## 2021-04-08 DIAGNOSIS — I509 Heart failure, unspecified: Secondary | ICD-10-CM | POA: Diagnosis not present

## 2021-04-08 DIAGNOSIS — D539 Nutritional anemia, unspecified: Secondary | ICD-10-CM | POA: Diagnosis not present

## 2021-04-08 DIAGNOSIS — I110 Hypertensive heart disease with heart failure: Secondary | ICD-10-CM | POA: Diagnosis not present

## 2021-04-08 DIAGNOSIS — N390 Urinary tract infection, site not specified: Secondary | ICD-10-CM | POA: Diagnosis not present

## 2021-04-09 ENCOUNTER — Telehealth: Payer: Self-pay | Admitting: *Deleted

## 2021-04-09 DIAGNOSIS — J189 Pneumonia, unspecified organism: Secondary | ICD-10-CM | POA: Diagnosis not present

## 2021-04-09 DIAGNOSIS — I509 Heart failure, unspecified: Secondary | ICD-10-CM | POA: Diagnosis not present

## 2021-04-09 DIAGNOSIS — F419 Anxiety disorder, unspecified: Secondary | ICD-10-CM | POA: Diagnosis not present

## 2021-04-09 DIAGNOSIS — N390 Urinary tract infection, site not specified: Secondary | ICD-10-CM | POA: Diagnosis not present

## 2021-04-09 DIAGNOSIS — J440 Chronic obstructive pulmonary disease with acute lower respiratory infection: Secondary | ICD-10-CM | POA: Diagnosis not present

## 2021-04-09 DIAGNOSIS — D539 Nutritional anemia, unspecified: Secondary | ICD-10-CM | POA: Diagnosis not present

## 2021-04-09 DIAGNOSIS — U071 COVID-19: Secondary | ICD-10-CM | POA: Diagnosis not present

## 2021-04-09 DIAGNOSIS — B9620 Unspecified Escherichia coli [E. coli] as the cause of diseases classified elsewhere: Secondary | ICD-10-CM | POA: Diagnosis not present

## 2021-04-09 DIAGNOSIS — I110 Hypertensive heart disease with heart failure: Secondary | ICD-10-CM | POA: Diagnosis not present

## 2021-04-09 NOTE — Telephone Encounter (Signed)
Call from Tom PT w/ [facility] Pt had fall right before he arrived at her home EMS came to assist her up She reports 8/10 pain in her lower back and right knee, she did not hit her back but did hit her knee and has a contusion on it. No PT done today. She is not in distress, just shaken up, does not want to go to the hospital

## 2021-04-10 ENCOUNTER — Other Ambulatory Visit: Payer: Self-pay

## 2021-04-10 DIAGNOSIS — Z1231 Encounter for screening mammogram for malignant neoplasm of breast: Secondary | ICD-10-CM

## 2021-04-10 NOTE — Telephone Encounter (Signed)
If pain continues she can schedule appointment for evaluation in the office.

## 2021-04-10 NOTE — Telephone Encounter (Signed)
Appointment scheduled.

## 2021-04-11 ENCOUNTER — Other Ambulatory Visit: Payer: Self-pay | Admitting: Family Medicine

## 2021-04-14 DIAGNOSIS — Z79899 Other long term (current) drug therapy: Secondary | ICD-10-CM | POA: Diagnosis not present

## 2021-04-14 DIAGNOSIS — M25561 Pain in right knee: Secondary | ICD-10-CM | POA: Diagnosis not present

## 2021-04-14 DIAGNOSIS — G609 Hereditary and idiopathic neuropathy, unspecified: Secondary | ICD-10-CM | POA: Diagnosis not present

## 2021-04-14 DIAGNOSIS — M25562 Pain in left knee: Secondary | ICD-10-CM | POA: Diagnosis not present

## 2021-04-15 ENCOUNTER — Other Ambulatory Visit: Payer: Self-pay | Admitting: Family Medicine

## 2021-04-15 DIAGNOSIS — Z1231 Encounter for screening mammogram for malignant neoplasm of breast: Secondary | ICD-10-CM

## 2021-04-16 DIAGNOSIS — I509 Heart failure, unspecified: Secondary | ICD-10-CM | POA: Diagnosis not present

## 2021-04-16 DIAGNOSIS — J189 Pneumonia, unspecified organism: Secondary | ICD-10-CM | POA: Diagnosis not present

## 2021-04-16 DIAGNOSIS — N390 Urinary tract infection, site not specified: Secondary | ICD-10-CM | POA: Diagnosis not present

## 2021-04-16 DIAGNOSIS — J440 Chronic obstructive pulmonary disease with acute lower respiratory infection: Secondary | ICD-10-CM | POA: Diagnosis not present

## 2021-04-16 DIAGNOSIS — D539 Nutritional anemia, unspecified: Secondary | ICD-10-CM | POA: Diagnosis not present

## 2021-04-16 DIAGNOSIS — B9620 Unspecified Escherichia coli [E. coli] as the cause of diseases classified elsewhere: Secondary | ICD-10-CM | POA: Diagnosis not present

## 2021-04-16 DIAGNOSIS — F419 Anxiety disorder, unspecified: Secondary | ICD-10-CM | POA: Diagnosis not present

## 2021-04-16 DIAGNOSIS — U071 COVID-19: Secondary | ICD-10-CM | POA: Diagnosis not present

## 2021-04-16 DIAGNOSIS — I110 Hypertensive heart disease with heart failure: Secondary | ICD-10-CM | POA: Diagnosis not present

## 2021-04-17 ENCOUNTER — Ambulatory Visit: Payer: Medicare HMO | Admitting: Family Medicine

## 2021-04-17 DIAGNOSIS — I110 Hypertensive heart disease with heart failure: Secondary | ICD-10-CM | POA: Diagnosis not present

## 2021-04-17 DIAGNOSIS — J440 Chronic obstructive pulmonary disease with acute lower respiratory infection: Secondary | ICD-10-CM | POA: Diagnosis not present

## 2021-04-17 DIAGNOSIS — U071 COVID-19: Secondary | ICD-10-CM | POA: Diagnosis not present

## 2021-04-17 DIAGNOSIS — F419 Anxiety disorder, unspecified: Secondary | ICD-10-CM | POA: Diagnosis not present

## 2021-04-17 DIAGNOSIS — D539 Nutritional anemia, unspecified: Secondary | ICD-10-CM | POA: Diagnosis not present

## 2021-04-17 DIAGNOSIS — N390 Urinary tract infection, site not specified: Secondary | ICD-10-CM | POA: Diagnosis not present

## 2021-04-17 DIAGNOSIS — B9620 Unspecified Escherichia coli [E. coli] as the cause of diseases classified elsewhere: Secondary | ICD-10-CM | POA: Diagnosis not present

## 2021-04-17 DIAGNOSIS — I509 Heart failure, unspecified: Secondary | ICD-10-CM | POA: Diagnosis not present

## 2021-04-17 DIAGNOSIS — J189 Pneumonia, unspecified organism: Secondary | ICD-10-CM | POA: Diagnosis not present

## 2021-04-20 DIAGNOSIS — J189 Pneumonia, unspecified organism: Secondary | ICD-10-CM | POA: Diagnosis not present

## 2021-04-20 DIAGNOSIS — J440 Chronic obstructive pulmonary disease with acute lower respiratory infection: Secondary | ICD-10-CM | POA: Diagnosis not present

## 2021-04-20 DIAGNOSIS — I509 Heart failure, unspecified: Secondary | ICD-10-CM | POA: Diagnosis not present

## 2021-04-20 DIAGNOSIS — D539 Nutritional anemia, unspecified: Secondary | ICD-10-CM | POA: Diagnosis not present

## 2021-04-20 DIAGNOSIS — F419 Anxiety disorder, unspecified: Secondary | ICD-10-CM | POA: Diagnosis not present

## 2021-04-20 DIAGNOSIS — I110 Hypertensive heart disease with heart failure: Secondary | ICD-10-CM | POA: Diagnosis not present

## 2021-04-20 DIAGNOSIS — B9620 Unspecified Escherichia coli [E. coli] as the cause of diseases classified elsewhere: Secondary | ICD-10-CM | POA: Diagnosis not present

## 2021-04-20 DIAGNOSIS — U071 COVID-19: Secondary | ICD-10-CM | POA: Diagnosis not present

## 2021-04-20 DIAGNOSIS — N390 Urinary tract infection, site not specified: Secondary | ICD-10-CM | POA: Diagnosis not present

## 2021-04-21 ENCOUNTER — Telehealth: Payer: Self-pay | Admitting: *Deleted

## 2021-04-21 DIAGNOSIS — I509 Heart failure, unspecified: Secondary | ICD-10-CM | POA: Diagnosis not present

## 2021-04-21 DIAGNOSIS — N390 Urinary tract infection, site not specified: Secondary | ICD-10-CM | POA: Diagnosis not present

## 2021-04-21 DIAGNOSIS — B9620 Unspecified Escherichia coli [E. coli] as the cause of diseases classified elsewhere: Secondary | ICD-10-CM | POA: Diagnosis not present

## 2021-04-21 DIAGNOSIS — U071 COVID-19: Secondary | ICD-10-CM | POA: Diagnosis not present

## 2021-04-21 DIAGNOSIS — I110 Hypertensive heart disease with heart failure: Secondary | ICD-10-CM | POA: Diagnosis not present

## 2021-04-21 DIAGNOSIS — F419 Anxiety disorder, unspecified: Secondary | ICD-10-CM | POA: Diagnosis not present

## 2021-04-21 DIAGNOSIS — D539 Nutritional anemia, unspecified: Secondary | ICD-10-CM | POA: Diagnosis not present

## 2021-04-21 DIAGNOSIS — J440 Chronic obstructive pulmonary disease with acute lower respiratory infection: Secondary | ICD-10-CM | POA: Diagnosis not present

## 2021-04-21 DIAGNOSIS — J189 Pneumonia, unspecified organism: Secondary | ICD-10-CM | POA: Diagnosis not present

## 2021-04-21 NOTE — Telephone Encounter (Signed)
VM from [place] PT w/ [facility] Called for VO to continue PT, rtc & LMOVM ok'd  Also on VM reporting increased HR at visit of 104, all other vitals WNL, pt not in any distress

## 2021-04-22 ENCOUNTER — Ambulatory Visit (INDEPENDENT_AMBULATORY_CARE_PROVIDER_SITE_OTHER): Payer: Medicare HMO | Admitting: *Deleted

## 2021-04-22 ENCOUNTER — Other Ambulatory Visit: Payer: Self-pay | Admitting: *Deleted

## 2021-04-22 DIAGNOSIS — I509 Heart failure, unspecified: Secondary | ICD-10-CM

## 2021-04-22 DIAGNOSIS — R296 Repeated falls: Secondary | ICD-10-CM

## 2021-04-22 DIAGNOSIS — I10 Essential (primary) hypertension: Secondary | ICD-10-CM

## 2021-04-22 DIAGNOSIS — J449 Chronic obstructive pulmonary disease, unspecified: Secondary | ICD-10-CM

## 2021-04-22 MED ORDER — LEVOTHYROXINE SODIUM 100 MCG PO TABS: 100.0000 ug | ORAL_TABLET | Freq: Every day | ORAL | 0 refills | Status: DC

## 2021-04-22 NOTE — Patient Instructions (Addendum)
Visit Information:   Patient Care Plan: RNCM Care Plan     Problem Identified: Chronic Disease Management Needs   Priority: Medium     Long-Range Goal: Work with [facility] Regarding Care Management and Care Coordination Associated with Chronic Disease States   Start Date: 02/20/2021  This Visit's Progress: On track  Recent Progress: On track  Priority: Medium  Note:   Current Barriers:  Chronic Disease Management support and education needs related to CHF, HTN, and COPD Transportation barriers  RNCM Clinical Goal(s):  Patient will continue to work with RN Care Manager to address care management and care coordination needs related to CHF, HTN, and COPD  through collaboration with RN Care manager, provider, and care team.   Interventions: 1:1 collaboration with primary care provider regarding development and update of comprehensive plan of care as evidenced by provider attestation and co-signature Inter-disciplinary care team collaboration (see longitudinal plan of care) Evaluation of current treatment plan related to  self management and patient's adherence to plan as established by provider Collaborated with [facility] Clinical staff regarding need to ref[facility]. Clinical staff is going to talk with PCP about taking over responsibility for these refills Advised patient that scripts were sent in on date of SNF discharge and that since she hasn't picked them up, they should still be available for pickup. [facility] and [facility] as they have likely took them off the shelf since it's been >1 month Reviewed and discussed upcoming appointment with PCP in Nov 2022.   Heart Failure Interventions:  (Status: Goal on track: YES.) Provided education on low sodium diet; Advised patient to weigh each morning after emptying bladder; Discussed importance of daily weight and advised patient to weigh and record daily; Reviewed role of diuretics in  prevention of fluid overload and management of heart failure; Discussed the importance of keeping all appointments with provider; Provided patient with education about the role of exercise in the management of heart failure; Assessed social determinant of health barriers;    COPD: (Status: Goal on track: YES.) Advised patient to track and manage COPD triggers;  Provided instruction about proper use of medications used for management of COPD including inhalers; Advised patient to self assesses COPD action plan zone and make appointment with provider if in the yellow zone for 48 hours without improvement; Advised patient to engage in light exercise as tolerated 3-5 days a week to aid in the the management of COPD; Provided education about and advised patient to utilize infection prevention strategies to reduce risk of respiratory infection; Discussed the importance of adequate rest and management of fatigue with COPD; Discussed recent hospitalization for pneumonia and discharge on 02/16/21 Provided with RNCM contact number and encouraged to reach out as needed   Falls:  (Status: Goal on track: YES.) Reviewed medications and discussed potential side effects of medications such as dizziness and frequent urination Advised patient of importance of notifying provider of falls Assessed for falls since last encounter Assessed patients knowledge of fall risk prevention secondary to previously provided education Provided patient information for fall alert systems Discussed use of assistive devices. Currently using cane  Discussed vertigo and the role it plays in falls Collaborated with [facility] clinical staff regarding refill of meclizine. Patient was prescribed this at [facility] and it helped.  Reviewed and reconciled medication list. [facility]clizine [facility].  Discussed home health nursing and PT services. Verbal order to continue services was given yesterday  by PCP  office.   Hypertension: (Status: Goal on track: YES.) Last practice recorded BP readings:  BP Readings from Last 3 Encounters:  03/17/21 116/71  10/23/20 127/86  09/16/20 113/78  Evaluation of current treatment plan related to hypertension self management and patient's adherence to plan as established by provider;   Reviewed medications with patient and discussed importance of compliance;  Counseled on the importance of exercise goals with target of 150 minutes per week Discussed plans with patient for ongoing care management follow up and provided patient with direct contact information for care management team; Advised patient, providing education and rationale, to monitor blood pressure daily and record, calling PCP for findings outside established parameters;  Discussed complications of poorly controlled blood pressure such as heart disease, stroke, circulatory complications, vision complications, kidney impairment, sexual dysfunction;    Hypothyroidism:  (Status: Goal on track: NO.) Discussed diagnosis of hypothyroidism during SNF stay and addition of Levothyroxine daily Reviewed upcoming appt with PCP in Nov Collaborated with clinical staff to request refill of levothyroxine Advised patient that a two week supply [facility] discharge on 03/15/21 and that she should be able to pick that up Discussed importance of medication compliance Asked patient to call RNCM with any questions or concerns   Patient Goals/Self-Care Activities: Patient will self administer medications as prescribed Patient will attend all scheduled provider appointments Patient will call pharmacy for medication refills Patient will continue to perform ADL's independently Patient will continue to perform IADL's independently Patient will call provider office for new concerns or questions Patient will continue to work with PT for strength and gait training Patient will call RN Care  Manager as needed [phone] Patient will check and record blood pressure daily and call PCP with any readings outside of recommended range Patient will check and record weight each morning and will call cardiologist with any weight gain of > 3 lbs overnight or > 5 lbs in one week Patient will move carefully and change positions slowly to reduce fall risk Patient will use assistive devices as needed for ambulation Patient will follow a low sodium/DASH diet [facility] of levothyroxine and meclizine      The patient verbalized understanding of instructions, educational materials, and care plan provided today and declined offer to receive copy of patient instructions, educational materials, and care plan.   Plan:Telephone follow up appointment with care management team member scheduled for:  05/07/21 with RNCM The patient has been provided with contact information for the care management team and has been advised to call with any health related questions or concerns.    Demetrios Loll, BSN, RN-BC Embedded Chronic Care Manager [facility] / [facility] Care Management Direct Dial: [phone]

## 2021-04-22 NOTE — Chronic Care Management (AMB) (Signed)
Chronic Care Management   CCM RN Visit Note  04/22/2021 Name: Brittany Conrad MRN: [id number] DOB: 03-16-1951  Subjective: Brittany Conrad is a 71 y.o. year old female who is a primary care patient of Stacks, Broadus John, MD. The care management team was consulted for assistance with disease management and care coordination needs.    Engaged with patient by telephone for follow up visit in response to provider referral for case management and/or care coordination services.   Consent to Services:  The patient was given information about Chronic Care Management services, agreed to services, and gave verbal consent prior to initiation of services.  Please see initial visit note for detailed documentation.   Patient agreed to services and verbal consent obtained.   Assessment: Review of patient past medical history, allergies, medications, health status, including review of consultants reports, laboratory and other test data, was performed as part of comprehensive evaluation and provision of chronic care management services.   SDOH (Social Determinants of Health) assessments and interventions performed:    CCM Care Plan  Allergies  Allergen Reactions   Tramadol     Can't take d/t pain clinic    Outpatient Encounter Medications as of 04/22/2021  Medication Sig Note   albuterol (PROVENTIL) (2.5 MG/3ML) 0.083% nebulizer solution INHALE 3 ML BY NEBULIZATION EVERY 6 HOURS AS NEEDED FOR WHEEZING OR SHORTNESS OF BREATH    albuterol (VENTOLIN HFA) 108 (90 Base) MCG/ACT inhaler INHALE 1-2 PUFFS BY MOUTH EVERY 6 HOURS AS NEEDED FOR WHEEZE OR SHORTNESS OF BREATH    amitriptyline (ELAVIL) 100 MG tablet Take 2 tablets (200 mg total) by mouth at bedtime. (Needs to be seen before next refill)    amLODipine (NORVASC) 10 MG tablet Take 1 tablet (10 mg total) by mouth daily.    benzonatate (TESSALON) 100 MG capsule Take 1 capsule (100 mg total) by mouth 2 (two) times daily as needed for cough.    busPIRone  (BUSPAR) 10 MG tablet Take 1 tablet (10 mg total) by mouth 3 (three) times daily.    clonazePAM (KLONOPIN) 0.5 MG tablet Take 1 tablet (0.5 mg total) by mouth at bedtime.    [facility] D3 25 MCG (1000 UT) capsule TAKE 1 CAPSULE (1,000 UNITS TOTAL) BY MOUTH DAILY.    diclofenac Sodium (VOLTAREN) 1 % GEL     doxycycline (VIBRAMYCIN) 100 MG capsule Take by mouth.    fluticasone (FLONASE) 50 MCG/ACT nasal spray 1 spray into each nostril in the morning.    furosemide (LASIX) 40 MG tablet TAKE 1 TABLET BY MOUTH TWICE A DAY    gabapentin (NEURONTIN) 400 MG capsule Take 400 mg by mouth 3 (three) times daily.    HYDROcodone-acetaminophen (NORCO) 7.5-325 MG tablet Take 1 tablet by mouth every 6 (six) hours as needed for moderate pain. 11/20/2020: Taking QID   hydrOXYzine (VISTARIL) 25 MG capsule Take 1 capsule (25 mg total) by mouth at bedtime as needed (insomnia).    ibuprofen (ADVIL,MOTRIN) 800 MG tablet Take 800 mg by mouth 3 (three) times daily as needed.     ipratropium (ATROVENT) 0.02 % nebulizer solution Take 2.5 mLs (0.5 mg total) by nebulization 4 (four) times daily.    KLOR-CON M20 20 MEQ tablet TAKE 2 TABLETS (40 MEQ TOTAL) BY MOUTH 3 (THREE) TIMES DAILY. FOR POTASSIUM REPLACEMENT/ SUPPLEMENT    levothyroxine (SYNTHROID) 100 MCG tablet Take 100 mcg by mouth daily.    meclizine (ANTIVERT) 25 MG tablet     OLANZapine (ZYPREXA) 10 MG tablet Take 1 tablet (  10 mg total) by mouth at bedtime.    pantoprazole (PROTONIX) 40 MG tablet TAKE 1 TABLET BY MOUTH TWICE A DAY    sucralfate (CARAFATE) 1 g tablet     SYMBICORT 160-4.5 MCG/ACT inhaler     XARELTO 10 MG TABS tablet     No facility-administered encounter medications on file as of 04/22/2021.    Patient Active Problem List   Diagnosis Date Noted   Arthralgia of both knees 02/09/2021   Frequent falls 02/09/2021   Urinary tract infection without hematuria 02/09/2021   Cough 12/25/2020   Hospital discharge follow-up 12/10/2020   Leukocytosis  11/29/2020   Pneumonia 11/29/2020   Sepsis (HCC) 11/29/2020   Chronic congestive heart failure, unspecified heart failure type (HCC) 10/23/2020   Essential hypertension 10/23/2020   Benzodiazepine dependence, continuous (HCC) 10/23/2020   Chronic pain syndrome 10/23/2020   Acute respiratory failure (HCC) 09/07/2020   MDD (major depressive disorder), recurrent episode, mild (HCC) 09/07/2019   GAD (generalized anxiety disorder) 03/01/2019   Normocytic anemia 04/13/2018   COPD (chronic obstructive pulmonary disease) (HCC) 04/13/2018    Conditions to be addressed/monitored:CHF, HTN, COPD, Hypothyroidism, and high fall risk  Care Plan : [facility] Care Plan  Updates made by Gwenith Daily, RN since 04/22/2021 12:00 AM     Problem: Chronic Disease Management Needs   Priority: Medium     Long-Range Goal: Work with [facility] Regarding Care Management and Care Coordination Associated with Chronic Disease States   Start Date: 02/20/2021  This Visit's Progress: On track  Recent Progress: On track  Priority: Medium  Note:   Current Barriers:  Chronic Disease Management support and education needs related to CHF, HTN, and COPD Transportation barriers  RNCM Clinical Goal(s):  Patient will continue to work with RN Care Manager to address care management and care coordination needs related to CHF, HTN, and COPD  through collaboration with RN Care manager, provider, and care team.   Interventions: 1:1 collaboration with primary care provider regarding development and update of comprehensive plan of care as evidenced by provider attestation and co-signature Inter-disciplinary care team collaboration (see longitudinal plan of care) Evaluation of current treatment plan related to  self management and patient's adherence to plan as established by provider Collaborated with [facility] Clinical staff regarding need [facility]. Clinical staff is going to talk with PCP about  taking over responsibility for these refills Advised patient that scripts were sent in on date of SNF discharge and that since she hasn't picked them up, they should still be available for pickup. [facility] as they have likely took them off the shelf since it's been >1 month Reviewed and discussed upcoming appointment with PCP in Nov 2022.   Heart Failure Interventions:  (Status: Goal on track: YES.) Provided education on low sodium diet; Advised patient to weigh each morning after emptying bladder; Discussed importance of daily weight and advised patient to weigh and record daily; Reviewed role of diuretics in prevention of fluid overload and management of heart failure; Discussed the importance of keeping all appointments with provider; Provided patient with education about the role of exercise in the management of heart failure; Assessed social determinant of health barriers;    COPD: (Status: Goal on track: YES.) Advised patient to track and manage COPD triggers;  Provided instruction about proper use of medications used for management of COPD including inhalers; Advised patient to self assesses COPD action plan zone and  make appointment with provider if in the yellow zone for 48 hours without improvement; Advised patient to engage in light exercise as tolerated 3-5 days a week to aid in the the management of COPD; Provided education about and advised patient to utilize infection prevention strategies to reduce risk of respiratory infection; Discussed the importance of adequate rest and management of fatigue with COPD; Discussed recent hospitalization for pneumonia and discharge on 02/16/21 Provided with RNCM contact number and encouraged to reach out as needed   Falls:  (Status: Goal on track: YES.) Reviewed medications and discussed potential side effects of medications such as dizziness and frequent urination Advised patient of importance of notifying  provider of falls Assessed for falls since last encounter Assessed patients knowledge of fall risk prevention secondary to previously provided education Provided patient information for fall alert systems Discussed use of assistive devices. Currently using cane  Discussed vertigo and the role it plays in falls Collaborated with [facility] clinical staff regarding refill of meclizine. Patient was prescribed this at [facility] and it helped.  Reviewed and reconciled medication list. [facility]cl[facility]ine [facility]  Discussed home health nursing and PT services. Verbal order to continue services was given yesterday by PCP office.   Hypertension: (Status: Goal on track: YES.) Last practice recorded BP readings:  BP Readings from Last 3 Encounters:  03/17/21 116/71  10/23/20 127/86  09/16/20 113/78  Evaluation of current treatment plan related to hypertension self management and patient's adherence to plan as established by provider;   Reviewed medications with patient and discussed importance of compliance;  Counseled on the importance of exercise goals with target of 150 minutes per week Discussed plans with patient for ongoing care management follow up and provided patient with direct contact information for care management team; Advised patient, providing education and rationale, to monitor blood pressure daily and record, calling PCP for findings outside established parameters;  Discussed complications of poorly controlled blood pressure such as heart disease, stroke, circulatory complications, vision complications, kidney impairment, sexual dysfunction;    Hypothyroidism:  (Status: Goal on track: NO.) Discussed diagnosis of hypothyroidism during SNF stay and addition of Levothyroxine daily Reviewed upcoming appt with PCP in Nov Collaborated with clinical staff to request refill of levothyroxine Advised patient that a two week [facility] on [facility]/21 and that she should be able to pick that up Discussed importance of medication compliance Asked patient to call RNCM with any questions or concerns   Patient Goals/Self-Care Activities: Patient will self administer medications as prescribed Patient will attend all scheduled provider appointments Patient will call pharmacy for medication refills Patient will continue to perform ADL's independently Patient will continue to perform IADL's independently Patient will call provider office for new concerns or questions Patient will continue to work with PT for strength and gait training Patient will call RN Care Manager as needed [phone] Patient will check and record blood pressure daily and call PCP with any readings outside of recommended range Patient will check and record weight each morning and will call cardiologist with any weight gain of > 3 lbs overnight or > 5 lbs in one week Patient will move carefully and change positions slowly to reduce fall risk Patient will use assistive devices as needed for ambulation Patient will follow a low sodium/DASH diet [facility] of lev[facility]roxine and meclizine     Plan:Telephone follow up appointment with care management  team member scheduled for:  05/07/21 with RNCM The patient has been provided with contact information for the care management team and has been advised to call with any health related questions or concerns.   Demetrios Loll, BSN, RN-BC Embedded Chronic Care Manager [facility] Care Management Direct Dial: [phone]

## 2021-04-22 NOTE — Telephone Encounter (Signed)
Pt had a CCM visit w/ Demetrios Loll, RN today She was put on Levothyroxine 100 mcg when she was in the hospital She is picking up this prescription today but it is only for a 2 wk supply, she will need a refill to get her to her appt on 05/18/21 with Dr. Darlyn Read Please advise & if appropriate send [facility]

## 2021-04-23 ENCOUNTER — Other Ambulatory Visit: Payer: Self-pay | Admitting: Family Medicine

## 2021-04-23 DIAGNOSIS — J440 Chronic obstructive pulmonary disease with acute lower respiratory infection: Secondary | ICD-10-CM | POA: Diagnosis not present

## 2021-04-23 DIAGNOSIS — J189 Pneumonia, unspecified organism: Secondary | ICD-10-CM | POA: Diagnosis not present

## 2021-04-23 DIAGNOSIS — F419 Anxiety disorder, unspecified: Secondary | ICD-10-CM | POA: Diagnosis not present

## 2021-04-23 DIAGNOSIS — B9620 Unspecified Escherichia coli [E. coli] as the cause of diseases classified elsewhere: Secondary | ICD-10-CM | POA: Diagnosis not present

## 2021-04-23 DIAGNOSIS — I509 Heart failure, unspecified: Secondary | ICD-10-CM | POA: Diagnosis not present

## 2021-04-23 DIAGNOSIS — N390 Urinary tract infection, site not specified: Secondary | ICD-10-CM | POA: Diagnosis not present

## 2021-04-23 DIAGNOSIS — D539 Nutritional anemia, unspecified: Secondary | ICD-10-CM | POA: Diagnosis not present

## 2021-04-23 DIAGNOSIS — F32A Depression, unspecified: Secondary | ICD-10-CM | POA: Diagnosis not present

## 2021-04-23 DIAGNOSIS — I110 Hypertensive heart disease with heart failure: Secondary | ICD-10-CM | POA: Diagnosis not present

## 2021-04-29 DIAGNOSIS — J440 Chronic obstructive pulmonary disease with acute lower respiratory infection: Secondary | ICD-10-CM | POA: Diagnosis not present

## 2021-04-29 DIAGNOSIS — B9620 Unspecified Escherichia coli [E. coli] as the cause of diseases classified elsewhere: Secondary | ICD-10-CM | POA: Diagnosis not present

## 2021-04-29 DIAGNOSIS — F419 Anxiety disorder, unspecified: Secondary | ICD-10-CM | POA: Diagnosis not present

## 2021-04-29 DIAGNOSIS — N390 Urinary tract infection, site not specified: Secondary | ICD-10-CM | POA: Diagnosis not present

## 2021-04-29 DIAGNOSIS — J189 Pneumonia, unspecified organism: Secondary | ICD-10-CM | POA: Diagnosis not present

## 2021-04-29 DIAGNOSIS — I110 Hypertensive heart disease with heart failure: Secondary | ICD-10-CM | POA: Diagnosis not present

## 2021-04-29 DIAGNOSIS — I509 Heart failure, unspecified: Secondary | ICD-10-CM | POA: Diagnosis not present

## 2021-04-29 DIAGNOSIS — D539 Nutritional anemia, unspecified: Secondary | ICD-10-CM | POA: Diagnosis not present

## 2021-04-29 DIAGNOSIS — F32A Depression, unspecified: Secondary | ICD-10-CM | POA: Diagnosis not present

## 2021-05-01 DIAGNOSIS — D539 Nutritional anemia, unspecified: Secondary | ICD-10-CM | POA: Diagnosis not present

## 2021-05-01 DIAGNOSIS — B9620 Unspecified Escherichia coli [E. coli] as the cause of diseases classified elsewhere: Secondary | ICD-10-CM | POA: Diagnosis not present

## 2021-05-01 DIAGNOSIS — F419 Anxiety disorder, unspecified: Secondary | ICD-10-CM | POA: Diagnosis not present

## 2021-05-01 DIAGNOSIS — N390 Urinary tract infection, site not specified: Secondary | ICD-10-CM | POA: Diagnosis not present

## 2021-05-01 DIAGNOSIS — I509 Heart failure, unspecified: Secondary | ICD-10-CM | POA: Diagnosis not present

## 2021-05-01 DIAGNOSIS — F32A Depression, unspecified: Secondary | ICD-10-CM | POA: Diagnosis not present

## 2021-05-01 DIAGNOSIS — J189 Pneumonia, unspecified organism: Secondary | ICD-10-CM | POA: Diagnosis not present

## 2021-05-01 DIAGNOSIS — I110 Hypertensive heart disease with heart failure: Secondary | ICD-10-CM | POA: Diagnosis not present

## 2021-05-01 DIAGNOSIS — J440 Chronic obstructive pulmonary disease with acute lower respiratory infection: Secondary | ICD-10-CM | POA: Diagnosis not present

## 2021-05-04 DIAGNOSIS — I509 Heart failure, unspecified: Secondary | ICD-10-CM | POA: Diagnosis not present

## 2021-05-04 DIAGNOSIS — I10 Essential (primary) hypertension: Secondary | ICD-10-CM

## 2021-05-04 DIAGNOSIS — F419 Anxiety disorder, unspecified: Secondary | ICD-10-CM | POA: Diagnosis not present

## 2021-05-04 DIAGNOSIS — J449 Chronic obstructive pulmonary disease, unspecified: Secondary | ICD-10-CM

## 2021-05-04 DIAGNOSIS — F32A Depression, unspecified: Secondary | ICD-10-CM | POA: Diagnosis not present

## 2021-05-04 DIAGNOSIS — N390 Urinary tract infection, site not specified: Secondary | ICD-10-CM | POA: Diagnosis not present

## 2021-05-04 DIAGNOSIS — B9620 Unspecified Escherichia coli [E. coli] as the cause of diseases classified elsewhere: Secondary | ICD-10-CM | POA: Diagnosis not present

## 2021-05-04 DIAGNOSIS — J189 Pneumonia, unspecified organism: Secondary | ICD-10-CM | POA: Diagnosis not present

## 2021-05-04 DIAGNOSIS — J440 Chronic obstructive pulmonary disease with acute lower respiratory infection: Secondary | ICD-10-CM | POA: Diagnosis not present

## 2021-05-04 DIAGNOSIS — I110 Hypertensive heart disease with heart failure: Secondary | ICD-10-CM | POA: Diagnosis not present

## 2021-05-04 DIAGNOSIS — D539 Nutritional anemia, unspecified: Secondary | ICD-10-CM | POA: Diagnosis not present

## 2021-05-05 DIAGNOSIS — B9620 Unspecified Escherichia coli [E. coli] as the cause of diseases classified elsewhere: Secondary | ICD-10-CM | POA: Diagnosis not present

## 2021-05-05 DIAGNOSIS — I110 Hypertensive heart disease with heart failure: Secondary | ICD-10-CM | POA: Diagnosis not present

## 2021-05-05 DIAGNOSIS — J440 Chronic obstructive pulmonary disease with acute lower respiratory infection: Secondary | ICD-10-CM | POA: Diagnosis not present

## 2021-05-05 DIAGNOSIS — J189 Pneumonia, unspecified organism: Secondary | ICD-10-CM | POA: Diagnosis not present

## 2021-05-05 DIAGNOSIS — D539 Nutritional anemia, unspecified: Secondary | ICD-10-CM | POA: Diagnosis not present

## 2021-05-05 DIAGNOSIS — F32A Depression, unspecified: Secondary | ICD-10-CM | POA: Diagnosis not present

## 2021-05-05 DIAGNOSIS — I509 Heart failure, unspecified: Secondary | ICD-10-CM | POA: Diagnosis not present

## 2021-05-05 DIAGNOSIS — F419 Anxiety disorder, unspecified: Secondary | ICD-10-CM | POA: Diagnosis not present

## 2021-05-05 DIAGNOSIS — N390 Urinary tract infection, site not specified: Secondary | ICD-10-CM | POA: Diagnosis not present

## 2021-05-07 ENCOUNTER — Other Ambulatory Visit: Payer: Self-pay | Admitting: Family Medicine

## 2021-05-07 ENCOUNTER — Telehealth: Payer: Medicare HMO | Admitting: *Deleted

## 2021-05-08 ENCOUNTER — Other Ambulatory Visit: Payer: Self-pay | Admitting: Family Medicine

## 2021-05-08 DIAGNOSIS — J189 Pneumonia, unspecified organism: Secondary | ICD-10-CM | POA: Diagnosis not present

## 2021-05-10 DIAGNOSIS — D539 Nutritional anemia, unspecified: Secondary | ICD-10-CM | POA: Diagnosis not present

## 2021-05-10 DIAGNOSIS — N390 Urinary tract infection, site not specified: Secondary | ICD-10-CM | POA: Diagnosis not present

## 2021-05-10 DIAGNOSIS — I110 Hypertensive heart disease with heart failure: Secondary | ICD-10-CM | POA: Diagnosis not present

## 2021-05-10 DIAGNOSIS — B9620 Unspecified Escherichia coli [E. coli] as the cause of diseases classified elsewhere: Secondary | ICD-10-CM | POA: Diagnosis not present

## 2021-05-10 DIAGNOSIS — I509 Heart failure, unspecified: Secondary | ICD-10-CM | POA: Diagnosis not present

## 2021-05-10 DIAGNOSIS — J189 Pneumonia, unspecified organism: Secondary | ICD-10-CM | POA: Diagnosis not present

## 2021-05-10 DIAGNOSIS — J440 Chronic obstructive pulmonary disease with acute lower respiratory infection: Secondary | ICD-10-CM | POA: Diagnosis not present

## 2021-05-10 DIAGNOSIS — F32A Depression, unspecified: Secondary | ICD-10-CM | POA: Diagnosis not present

## 2021-05-10 DIAGNOSIS — F419 Anxiety disorder, unspecified: Secondary | ICD-10-CM | POA: Diagnosis not present

## 2021-05-11 DIAGNOSIS — J189 Pneumonia, unspecified organism: Secondary | ICD-10-CM | POA: Diagnosis not present

## 2021-05-11 DIAGNOSIS — J440 Chronic obstructive pulmonary disease with acute lower respiratory infection: Secondary | ICD-10-CM | POA: Diagnosis not present

## 2021-05-11 DIAGNOSIS — I110 Hypertensive heart disease with heart failure: Secondary | ICD-10-CM | POA: Diagnosis not present

## 2021-05-11 DIAGNOSIS — F32A Depression, unspecified: Secondary | ICD-10-CM | POA: Diagnosis not present

## 2021-05-11 DIAGNOSIS — I509 Heart failure, unspecified: Secondary | ICD-10-CM | POA: Diagnosis not present

## 2021-05-11 DIAGNOSIS — D539 Nutritional anemia, unspecified: Secondary | ICD-10-CM | POA: Diagnosis not present

## 2021-05-11 DIAGNOSIS — N390 Urinary tract infection, site not specified: Secondary | ICD-10-CM | POA: Diagnosis not present

## 2021-05-11 DIAGNOSIS — B9620 Unspecified Escherichia coli [E. coli] as the cause of diseases classified elsewhere: Secondary | ICD-10-CM | POA: Diagnosis not present

## 2021-05-11 DIAGNOSIS — F419 Anxiety disorder, unspecified: Secondary | ICD-10-CM | POA: Diagnosis not present

## 2021-05-12 DIAGNOSIS — G609 Hereditary and idiopathic neuropathy, unspecified: Secondary | ICD-10-CM | POA: Diagnosis not present

## 2021-05-12 DIAGNOSIS — M25561 Pain in right knee: Secondary | ICD-10-CM | POA: Diagnosis not present

## 2021-05-12 DIAGNOSIS — M25562 Pain in left knee: Secondary | ICD-10-CM | POA: Diagnosis not present

## 2021-05-12 DIAGNOSIS — Z79899 Other long term (current) drug therapy: Secondary | ICD-10-CM | POA: Diagnosis not present

## 2021-05-18 ENCOUNTER — Other Ambulatory Visit: Payer: Self-pay

## 2021-05-18 ENCOUNTER — Ambulatory Visit (INDEPENDENT_AMBULATORY_CARE_PROVIDER_SITE_OTHER): Payer: Medicare HMO | Admitting: Family Medicine

## 2021-05-18 ENCOUNTER — Encounter: Payer: Self-pay | Admitting: Family Medicine

## 2021-05-18 VITALS — BP 122/79 | HR 107 | Temp 97.3°F | Ht 62.0 in | Wt 159.2 lb

## 2021-05-18 DIAGNOSIS — Z23 Encounter for immunization: Secondary | ICD-10-CM

## 2021-05-18 DIAGNOSIS — G894 Chronic pain syndrome: Secondary | ICD-10-CM | POA: Diagnosis not present

## 2021-05-18 DIAGNOSIS — E039 Hypothyroidism, unspecified: Secondary | ICD-10-CM

## 2021-05-18 DIAGNOSIS — J449 Chronic obstructive pulmonary disease, unspecified: Secondary | ICD-10-CM

## 2021-05-18 DIAGNOSIS — F411 Generalized anxiety disorder: Secondary | ICD-10-CM | POA: Diagnosis not present

## 2021-05-18 DIAGNOSIS — I10 Essential (primary) hypertension: Secondary | ICD-10-CM | POA: Diagnosis not present

## 2021-05-18 MED ORDER — BUSPIRONE HCL 10 MG PO TABS: 10.0000 mg | ORAL_TABLET | Freq: Three times a day (TID) | ORAL | 5 refills | Status: DC

## 2021-05-18 MED ORDER — AMITRIPTYLINE HCL 100 MG PO TABS: 200.0000 mg | ORAL_TABLET | Freq: Every day | ORAL | 1 refills | Status: DC

## 2021-05-18 MED ORDER — MECLIZINE HCL 25 MG PO TABS: ORAL_TABLET | ORAL | 5 refills | Status: DC

## 2021-05-18 NOTE — Progress Notes (Signed)
Subjective:  Patient ID: Brittany Conrad, female    DOB: [birth year]  Age: 70 y.o. MRN: [id number]  CC: Back Pain   HPI Talullah Abate presents for transfer from psych. She just decided not to go back.Tapered herself off of clonazepam. Last dose was 2 months ago. Also DCed olanzepine. Feels the same off as on med. The exception is she is less dizzy. Occasional vertigo resolves with BID meclizine. Her head is clearer  Depression screen [facility] 2/9 05/18/2021 03/17/2021 03/17/2021  Decreased Interest 0 1 0  Down, Depressed, Hopeless 0 2 0  PHQ - 2 Score 0 3 0  Altered sleeping - 1 -  Tired, decreased energy - 1 -  Change in appetite - 1 -  Feeling bad or failure about yourself  - 0 -  Trouble concentrating - 1 -  Moving slowly or fidgety/restless - 1 -  Suicidal thoughts - 0 -  PHQ-9 Score - 8 -  Difficult doing work/chores - Not difficult at all -    History Cimberly has a past medical history of Asthma, Bipolar 1 disorder ([place]), COPD (chronic obstructive pulmonary disease) ([place]), Emphysema lung ([place]), Emphysema of lung ([place]), Neuropathy, and Scoliosis.   She has a past surgical history that includes Abdominal hysterectomy; Gallbladder surgery; Breast reduction surgery; tummy tuck; Back surgery; and Gastric bypass.   Her family history includes Alzheimer's disease in her maternal grandmother; Bipolar disorder in her daughter; Cancer in her father, mother, and paternal grandmother; Stroke in her paternal grandfather; Tuberculosis in her maternal grandfather.She reports that she quit smoking about 4 years ago. Her smoking use included cigarettes. She has a 70.00 pack-year smoking history. She has never used smokeless tobacco. She reports current alcohol use. She reports that she does not use drugs.    ROS Review of Systems  Constitutional: Negative.   HENT: Negative.    Eyes:  Negative for visual disturbance.  Respiratory:  Negative for shortness of breath.   Cardiovascular:  Negative for  chest pain.  Gastrointestinal:  Negative for abdominal pain.  Musculoskeletal:  Negative for arthralgias.   Objective:  BP 122/79   Pulse (!) 107   Temp (!) 97.3 F (36.3 C)   Ht '5\' 2"'  (1.575 m)   Wt 159 lb 3.2 oz (72.2 kg)   SpO2 100%   BMI 29.12 kg/m   BP Readings from Last 3 Encounters:  05/18/21 122/79  03/17/21 116/71  10/23/20 127/86    Wt Readings from Last 3 Encounters:  05/18/21 159 lb 3.2 oz (72.2 kg)  03/17/21 162 lb 12.8 oz (73.8 kg)  11/20/20 174 lb (78.9 kg)     Physical Exam Constitutional:      General: She is not in acute distress.    Appearance: She is well-developed.  Cardiovascular:     Rate and Rhythm: Normal rate and regular rhythm.  Pulmonary:     Breath sounds: Normal breath sounds.  Musculoskeletal:        General: Normal range of motion.  Skin:    General: Skin is warm and dry.  Neurological:     Mental Status: She is alert and oriented to person, place, and time.      Assessment & Plan:   Raiana was seen today for back pain.  Diagnoses and all orders for this visit:  Hypothyroidism, unspecified type -     TSH + free T4 -     CBC with Differential/Platelet -     CMP14+EGFR  GAD (generalized anxiety disorder) -  CBC with Differential/Platelet -     CMP14+EGFR  Chronic obstructive pulmonary disease, unspecified COPD type (HCC) -     CBC with Differential/Platelet -     CMP14+EGFR  Essential hypertension -     CBC with Differential/Platelet -     CMP14+EGFR  Chronic pain syndrome -     CBC with Differential/Platelet -     CMP14+EGFR  Other orders -     meclizine (ANTIVERT) 25 MG tablet; TAKE 1 TABLET BY MOUTH EVERY 8 HOURS AS NEEDED FOR VERTIGO -     amitriptyline (ELAVIL) 100 MG tablet; Take 2 tablets (200 mg total) by mouth at bedtime. (Needs to be seen before next refill) -     busPIRone (BUSPAR) 10 MG tablet; Take 1 tablet (10 mg total) by mouth 3 (three) times daily.      I have discontinued Arbie Cookey  Yoshida's OLANZapine, doxycycline, benzonatate, and clonazePAM. I have also changed her busPIRone. Additionally, I am having her maintain her ibuprofen, [facility] D3, HYDROcodone-acetaminophen, gabapentin, Xarelto, diclofenac Sodium, sucralfate, fluticasone, ipratropium, albuterol, furosemide, amLODipine, albuterol, pantoprazole, Klor-Con M20, Symbicort, levothyroxine, meclizine, and amitriptyline.  Allergies as of 05/18/2021       Reactions   Tramadol    Can't take d/t pain clinic        Medication List        Accurate as of May 18, 2021  2:28 PM. If you have any questions, ask your nurse or doctor.          STOP taking these medications    benzonatate 100 MG capsule Commonly known as: TESSALON Stopped by: [name], MD   clonazePAM 0.5 MG tablet Commonly known as: KLONOPIN Stopped by: [name], MD   doxycycline 100 MG capsule Commonly known as: VIBRAMYCIN Stopped by: [name], MD   OLANZapine 10 MG tablet Commonly known as: ZYPREXA Stopped by: [name], MD       TAKE these medications    albuterol 108 (90 Base) MCG/ACT inhaler Commonly known as: VENTOLIN HFA INHALE 1-2 PUFFS BY MOUTH EVERY 6 HOURS AS NEEDED FOR WHEEZE OR SHORTNESS OF BREATH   albuterol (2.5 MG/3ML) 0.083% nebulizer solution Commonly known as: PROVENTIL INHALE 3 ML BY NEBULIZATION EVERY 6 HOURS AS NEEDED FOR WHEEZING OR SHORTNESS OF BREATH   amitriptyline 100 MG tablet Commonly known as: ELAVIL Take 2 tablets (200 mg total) by mouth at bedtime. (Needs to be seen before next refill)   amLODipine 10 MG tablet Commonly known as: NORVASC Take 1 tablet (10 mg total) by mouth daily.   busPIRone 10 MG tablet Commonly known as: BUSPAR Take 1 tablet (10 mg total) by mouth 3 (three) times daily.   [facility] D3 25 MCG (1000 UT) capsule Generic drug: Cholecalciferol TAKE 1 CAPSULE (1,000 UNITS TOTAL) BY MOUTH DAILY.   diclofenac Sodium 1 % Gel Commonly known as: VOLTAREN    fluticasone 50 MCG/ACT nasal spray Commonly known as: FLONASE 1 spray into each nostril in the morning.   furosemide 40 MG tablet Commonly known as: LASIX TAKE 1 TABLET BY MOUTH TWICE A DAY   gabapentin 400 MG capsule Commonly known as: NEURONTIN Take 400 mg by mouth 3 (three) times daily.   HYDROcodone-acetaminophen 7.5-325 MG tablet Commonly known as: NORCO Take 1 tablet by mouth every 6 (six) hours as needed for moderate pain.   ibuprofen 800 MG tablet Commonly known as: ADVIL Take 800 mg by mouth 3 (three) times daily as needed.   ipratropium 0.02 % nebulizer  solution Commonly known as: ATROVENT Take 2.5 mLs (0.5 mg total) by nebulization 4 (four) times daily.   Klor-Con M20 20 MEQ tablet Generic drug: potassium chloride SA TAKE 2 TABLETS (40 MEQ TOTAL) BY MOUTH 3 (THREE) TIMES DAILY. FOR POTASSIUM REPLACEMENT/ SUPPLEMENT   levothyroxine 100 MCG tablet Commonly known as: SYNTHROID Take 1 tablet (100 mcg total) by mouth daily.   meclizine 25 MG tablet Commonly known as: ANTIVERT TAKE 1 TABLET BY MOUTH EVERY 8 HOURS AS NEEDED FOR VERTIGO   pantoprazole 40 MG tablet Commonly known as: PROTONIX TAKE 1 TABLET BY MOUTH TWICE A DAY   sucralfate 1 g tablet Commonly known as: CARAFATE   Symbicort 160-4.5 MCG/ACT inhaler Generic drug: budesonide-formoterol   Xarelto 10 MG Tabs tablet Generic drug: rivaroxaban         Follow-up: Return in about 6 months (around 11/15/2021).  [name], M.D.

## 2021-05-20 DIAGNOSIS — I509 Heart failure, unspecified: Secondary | ICD-10-CM | POA: Diagnosis not present

## 2021-05-20 DIAGNOSIS — F32A Depression, unspecified: Secondary | ICD-10-CM | POA: Diagnosis not present

## 2021-05-20 DIAGNOSIS — F419 Anxiety disorder, unspecified: Secondary | ICD-10-CM | POA: Diagnosis not present

## 2021-05-20 DIAGNOSIS — N390 Urinary tract infection, site not specified: Secondary | ICD-10-CM | POA: Diagnosis not present

## 2021-05-20 DIAGNOSIS — B9620 Unspecified Escherichia coli [E. coli] as the cause of diseases classified elsewhere: Secondary | ICD-10-CM | POA: Diagnosis not present

## 2021-05-20 DIAGNOSIS — J440 Chronic obstructive pulmonary disease with acute lower respiratory infection: Secondary | ICD-10-CM | POA: Diagnosis not present

## 2021-05-20 DIAGNOSIS — I110 Hypertensive heart disease with heart failure: Secondary | ICD-10-CM | POA: Diagnosis not present

## 2021-05-20 DIAGNOSIS — J189 Pneumonia, unspecified organism: Secondary | ICD-10-CM | POA: Diagnosis not present

## 2021-05-20 DIAGNOSIS — D539 Nutritional anemia, unspecified: Secondary | ICD-10-CM | POA: Diagnosis not present

## 2021-05-23 DIAGNOSIS — J189 Pneumonia, unspecified organism: Secondary | ICD-10-CM | POA: Diagnosis not present

## 2021-05-23 DIAGNOSIS — J440 Chronic obstructive pulmonary disease with acute lower respiratory infection: Secondary | ICD-10-CM | POA: Diagnosis not present

## 2021-05-23 DIAGNOSIS — B9620 Unspecified Escherichia coli [E. coli] as the cause of diseases classified elsewhere: Secondary | ICD-10-CM | POA: Diagnosis not present

## 2021-05-23 DIAGNOSIS — F32A Depression, unspecified: Secondary | ICD-10-CM | POA: Diagnosis not present

## 2021-05-23 DIAGNOSIS — N390 Urinary tract infection, site not specified: Secondary | ICD-10-CM | POA: Diagnosis not present

## 2021-05-23 DIAGNOSIS — I509 Heart failure, unspecified: Secondary | ICD-10-CM | POA: Diagnosis not present

## 2021-05-23 DIAGNOSIS — F419 Anxiety disorder, unspecified: Secondary | ICD-10-CM | POA: Diagnosis not present

## 2021-05-23 DIAGNOSIS — D539 Nutritional anemia, unspecified: Secondary | ICD-10-CM | POA: Diagnosis not present

## 2021-05-23 DIAGNOSIS — I110 Hypertensive heart disease with heart failure: Secondary | ICD-10-CM | POA: Diagnosis not present

## 2021-05-24 DIAGNOSIS — J189 Pneumonia, unspecified organism: Secondary | ICD-10-CM | POA: Diagnosis not present

## 2021-05-24 DIAGNOSIS — I509 Heart failure, unspecified: Secondary | ICD-10-CM | POA: Diagnosis not present

## 2021-05-24 DIAGNOSIS — F419 Anxiety disorder, unspecified: Secondary | ICD-10-CM | POA: Diagnosis not present

## 2021-05-24 DIAGNOSIS — B9620 Unspecified Escherichia coli [E. coli] as the cause of diseases classified elsewhere: Secondary | ICD-10-CM | POA: Diagnosis not present

## 2021-05-24 DIAGNOSIS — F32A Depression, unspecified: Secondary | ICD-10-CM | POA: Diagnosis not present

## 2021-05-24 DIAGNOSIS — D539 Nutritional anemia, unspecified: Secondary | ICD-10-CM | POA: Diagnosis not present

## 2021-05-24 DIAGNOSIS — N390 Urinary tract infection, site not specified: Secondary | ICD-10-CM | POA: Diagnosis not present

## 2021-05-24 DIAGNOSIS — I110 Hypertensive heart disease with heart failure: Secondary | ICD-10-CM | POA: Diagnosis not present

## 2021-05-24 DIAGNOSIS — J440 Chronic obstructive pulmonary disease with acute lower respiratory infection: Secondary | ICD-10-CM | POA: Diagnosis not present

## 2021-05-27 DIAGNOSIS — J440 Chronic obstructive pulmonary disease with acute lower respiratory infection: Secondary | ICD-10-CM | POA: Diagnosis not present

## 2021-05-27 DIAGNOSIS — I509 Heart failure, unspecified: Secondary | ICD-10-CM | POA: Diagnosis not present

## 2021-05-27 DIAGNOSIS — B9620 Unspecified Escherichia coli [E. coli] as the cause of diseases classified elsewhere: Secondary | ICD-10-CM | POA: Diagnosis not present

## 2021-05-27 DIAGNOSIS — J189 Pneumonia, unspecified organism: Secondary | ICD-10-CM | POA: Diagnosis not present

## 2021-05-27 DIAGNOSIS — F419 Anxiety disorder, unspecified: Secondary | ICD-10-CM | POA: Diagnosis not present

## 2021-05-27 DIAGNOSIS — F32A Depression, unspecified: Secondary | ICD-10-CM | POA: Diagnosis not present

## 2021-05-27 DIAGNOSIS — D539 Nutritional anemia, unspecified: Secondary | ICD-10-CM | POA: Diagnosis not present

## 2021-05-27 DIAGNOSIS — N390 Urinary tract infection, site not specified: Secondary | ICD-10-CM | POA: Diagnosis not present

## 2021-05-27 DIAGNOSIS — I110 Hypertensive heart disease with heart failure: Secondary | ICD-10-CM | POA: Diagnosis not present

## 2021-05-29 ENCOUNTER — Other Ambulatory Visit: Payer: Self-pay | Admitting: Family Medicine

## 2021-06-01 DIAGNOSIS — J440 Chronic obstructive pulmonary disease with acute lower respiratory infection: Secondary | ICD-10-CM | POA: Diagnosis not present

## 2021-06-01 DIAGNOSIS — I509 Heart failure, unspecified: Secondary | ICD-10-CM | POA: Diagnosis not present

## 2021-06-01 DIAGNOSIS — F419 Anxiety disorder, unspecified: Secondary | ICD-10-CM | POA: Diagnosis not present

## 2021-06-01 DIAGNOSIS — F32A Depression, unspecified: Secondary | ICD-10-CM | POA: Diagnosis not present

## 2021-06-01 DIAGNOSIS — D539 Nutritional anemia, unspecified: Secondary | ICD-10-CM | POA: Diagnosis not present

## 2021-06-01 DIAGNOSIS — B9620 Unspecified Escherichia coli [E. coli] as the cause of diseases classified elsewhere: Secondary | ICD-10-CM | POA: Diagnosis not present

## 2021-06-01 DIAGNOSIS — N390 Urinary tract infection, site not specified: Secondary | ICD-10-CM | POA: Diagnosis not present

## 2021-06-01 DIAGNOSIS — I110 Hypertensive heart disease with heart failure: Secondary | ICD-10-CM | POA: Diagnosis not present

## 2021-06-01 DIAGNOSIS — J189 Pneumonia, unspecified organism: Secondary | ICD-10-CM | POA: Diagnosis not present

## 2021-06-03 ENCOUNTER — Telehealth: Payer: Self-pay | Admitting: Family Medicine

## 2021-06-03 NOTE — Telephone Encounter (Signed)
She could use [employer]

## 2021-06-03 NOTE — Telephone Encounter (Signed)
Patient aware.

## 2021-06-03 NOTE — Telephone Encounter (Signed)
Pt called to cancel her mammogram appt for today because she says she cant use the bus stairs or ramp to get on the bus because she needs something with railing.  Wants to know if there is anywhere nearby that Dr Darlyn Read could send her to have mammogram done that doesn't require stairs/lifts.  Please advise and call patient.

## 2021-06-08 DIAGNOSIS — J189 Pneumonia, unspecified organism: Secondary | ICD-10-CM | POA: Diagnosis not present

## 2021-06-08 DIAGNOSIS — I509 Heart failure, unspecified: Secondary | ICD-10-CM | POA: Diagnosis not present

## 2021-06-08 DIAGNOSIS — B9620 Unspecified Escherichia coli [E. coli] as the cause of diseases classified elsewhere: Secondary | ICD-10-CM | POA: Diagnosis not present

## 2021-06-08 DIAGNOSIS — D539 Nutritional anemia, unspecified: Secondary | ICD-10-CM | POA: Diagnosis not present

## 2021-06-08 DIAGNOSIS — F32A Depression, unspecified: Secondary | ICD-10-CM | POA: Diagnosis not present

## 2021-06-08 DIAGNOSIS — J440 Chronic obstructive pulmonary disease with acute lower respiratory infection: Secondary | ICD-10-CM | POA: Diagnosis not present

## 2021-06-08 DIAGNOSIS — I110 Hypertensive heart disease with heart failure: Secondary | ICD-10-CM | POA: Diagnosis not present

## 2021-06-08 DIAGNOSIS — N390 Urinary tract infection, site not specified: Secondary | ICD-10-CM | POA: Diagnosis not present

## 2021-06-08 DIAGNOSIS — F419 Anxiety disorder, unspecified: Secondary | ICD-10-CM | POA: Diagnosis not present

## 2021-06-09 DIAGNOSIS — M25562 Pain in left knee: Secondary | ICD-10-CM | POA: Diagnosis not present

## 2021-06-09 DIAGNOSIS — Z79899 Other long term (current) drug therapy: Secondary | ICD-10-CM | POA: Diagnosis not present

## 2021-06-09 DIAGNOSIS — G609 Hereditary and idiopathic neuropathy, unspecified: Secondary | ICD-10-CM | POA: Diagnosis not present

## 2021-06-09 DIAGNOSIS — M25561 Pain in right knee: Secondary | ICD-10-CM | POA: Diagnosis not present

## 2021-06-10 DIAGNOSIS — D539 Nutritional anemia, unspecified: Secondary | ICD-10-CM | POA: Diagnosis not present

## 2021-06-10 DIAGNOSIS — F419 Anxiety disorder, unspecified: Secondary | ICD-10-CM | POA: Diagnosis not present

## 2021-06-10 DIAGNOSIS — J440 Chronic obstructive pulmonary disease with acute lower respiratory infection: Secondary | ICD-10-CM | POA: Diagnosis not present

## 2021-06-10 DIAGNOSIS — I509 Heart failure, unspecified: Secondary | ICD-10-CM | POA: Diagnosis not present

## 2021-06-10 DIAGNOSIS — I110 Hypertensive heart disease with heart failure: Secondary | ICD-10-CM | POA: Diagnosis not present

## 2021-06-10 DIAGNOSIS — F32A Depression, unspecified: Secondary | ICD-10-CM | POA: Diagnosis not present

## 2021-06-10 DIAGNOSIS — J189 Pneumonia, unspecified organism: Secondary | ICD-10-CM | POA: Diagnosis not present

## 2021-06-10 DIAGNOSIS — B9620 Unspecified Escherichia coli [E. coli] as the cause of diseases classified elsewhere: Secondary | ICD-10-CM | POA: Diagnosis not present

## 2021-06-10 DIAGNOSIS — N390 Urinary tract infection, site not specified: Secondary | ICD-10-CM | POA: Diagnosis not present

## 2021-06-11 ENCOUNTER — Telehealth: Payer: Self-pay | Admitting: Family Medicine

## 2021-06-11 NOTE — Telephone Encounter (Signed)
Patient aware.

## 2021-06-11 NOTE — Telephone Encounter (Signed)
LEt her know this did not meet criteria.

## 2021-06-11 NOTE — Telephone Encounter (Signed)
Blake Divine called from [employer] regarding [facility] Authorization. Says OV notes sent for this are not sufficient for [facility] to be approved. Need a peer to peer visit scheduled by 12:00 tomorrow and held by 5:00 the following day.  Call call [phone] option 1 for more info

## 2021-06-12 DIAGNOSIS — R531 Weakness: Secondary | ICD-10-CM | POA: Diagnosis not present

## 2021-06-12 DIAGNOSIS — R5381 Other malaise: Secondary | ICD-10-CM | POA: Diagnosis not present

## 2021-06-17 DIAGNOSIS — F32A Depression, unspecified: Secondary | ICD-10-CM | POA: Diagnosis not present

## 2021-06-17 DIAGNOSIS — I110 Hypertensive heart disease with heart failure: Secondary | ICD-10-CM | POA: Diagnosis not present

## 2021-06-17 DIAGNOSIS — N390 Urinary tract infection, site not specified: Secondary | ICD-10-CM | POA: Diagnosis not present

## 2021-06-17 DIAGNOSIS — J440 Chronic obstructive pulmonary disease with acute lower respiratory infection: Secondary | ICD-10-CM | POA: Diagnosis not present

## 2021-06-17 DIAGNOSIS — F419 Anxiety disorder, unspecified: Secondary | ICD-10-CM | POA: Diagnosis not present

## 2021-06-17 DIAGNOSIS — J189 Pneumonia, unspecified organism: Secondary | ICD-10-CM | POA: Diagnosis not present

## 2021-06-17 DIAGNOSIS — B9620 Unspecified Escherichia coli [E. coli] as the cause of diseases classified elsewhere: Secondary | ICD-10-CM | POA: Diagnosis not present

## 2021-06-17 DIAGNOSIS — D539 Nutritional anemia, unspecified: Secondary | ICD-10-CM | POA: Diagnosis not present

## 2021-06-17 DIAGNOSIS — I509 Heart failure, unspecified: Secondary | ICD-10-CM | POA: Diagnosis not present

## 2021-06-18 DIAGNOSIS — J189 Pneumonia, unspecified organism: Secondary | ICD-10-CM | POA: Diagnosis not present

## 2021-06-18 DIAGNOSIS — N390 Urinary tract infection, site not specified: Secondary | ICD-10-CM | POA: Diagnosis not present

## 2021-06-18 DIAGNOSIS — D539 Nutritional anemia, unspecified: Secondary | ICD-10-CM | POA: Diagnosis not present

## 2021-06-18 DIAGNOSIS — J440 Chronic obstructive pulmonary disease with acute lower respiratory infection: Secondary | ICD-10-CM | POA: Diagnosis not present

## 2021-06-18 DIAGNOSIS — I110 Hypertensive heart disease with heart failure: Secondary | ICD-10-CM | POA: Diagnosis not present

## 2021-06-18 DIAGNOSIS — I509 Heart failure, unspecified: Secondary | ICD-10-CM | POA: Diagnosis not present

## 2021-06-18 DIAGNOSIS — F419 Anxiety disorder, unspecified: Secondary | ICD-10-CM | POA: Diagnosis not present

## 2021-06-18 DIAGNOSIS — F32A Depression, unspecified: Secondary | ICD-10-CM | POA: Diagnosis not present

## 2021-06-18 DIAGNOSIS — B9620 Unspecified Escherichia coli [E. coli] as the cause of diseases classified elsewhere: Secondary | ICD-10-CM | POA: Diagnosis not present

## 2021-06-22 DIAGNOSIS — F310 Bipolar disorder, current episode hypomanic: Secondary | ICD-10-CM | POA: Diagnosis not present

## 2021-06-22 DIAGNOSIS — F411 Generalized anxiety disorder: Secondary | ICD-10-CM | POA: Diagnosis not present

## 2021-06-27 ENCOUNTER — Other Ambulatory Visit: Payer: Self-pay | Admitting: Family Medicine

## 2021-07-05 ENCOUNTER — Other Ambulatory Visit: Payer: Self-pay | Admitting: Family Medicine

## 2021-07-08 DIAGNOSIS — Z79899 Other long term (current) drug therapy: Secondary | ICD-10-CM | POA: Diagnosis not present

## 2021-07-08 DIAGNOSIS — M25561 Pain in right knee: Secondary | ICD-10-CM | POA: Diagnosis not present

## 2021-07-08 DIAGNOSIS — M25562 Pain in left knee: Secondary | ICD-10-CM | POA: Diagnosis not present

## 2021-07-08 DIAGNOSIS — G609 Hereditary and idiopathic neuropathy, unspecified: Secondary | ICD-10-CM | POA: Diagnosis not present

## 2021-07-10 DIAGNOSIS — R531 Weakness: Secondary | ICD-10-CM | POA: Diagnosis not present

## 2021-07-10 DIAGNOSIS — R5381 Other malaise: Secondary | ICD-10-CM | POA: Diagnosis not present

## 2021-07-10 DIAGNOSIS — W19XXXA Unspecified fall, initial encounter: Secondary | ICD-10-CM | POA: Diagnosis not present

## 2021-07-13 DIAGNOSIS — Z79899 Other long term (current) drug therapy: Secondary | ICD-10-CM | POA: Diagnosis not present

## 2021-07-14 ENCOUNTER — Other Ambulatory Visit: Payer: Self-pay | Admitting: Family Medicine

## 2021-07-15 ENCOUNTER — Encounter (HOSPITAL_COMMUNITY): Payer: Self-pay

## 2021-07-15 NOTE — Progress Notes (Signed)
Attempted to reach patient regarding [facility] referral. Patient answered the phone and then abruptly ended the call.

## 2021-07-25 DIAGNOSIS — T1490XA Injury, unspecified, initial encounter: Secondary | ICD-10-CM | POA: Diagnosis not present

## 2021-07-25 DIAGNOSIS — R5381 Other malaise: Secondary | ICD-10-CM | POA: Diagnosis not present

## 2021-07-26 ENCOUNTER — Other Ambulatory Visit: Payer: Self-pay | Admitting: Family Medicine

## 2021-07-26 DIAGNOSIS — J449 Chronic obstructive pulmonary disease, unspecified: Secondary | ICD-10-CM

## 2021-07-27 ENCOUNTER — Ambulatory Visit (INDEPENDENT_AMBULATORY_CARE_PROVIDER_SITE_OTHER): Payer: Medicare HMO | Admitting: Nurse Practitioner

## 2021-07-27 ENCOUNTER — Ambulatory Visit (INDEPENDENT_AMBULATORY_CARE_PROVIDER_SITE_OTHER): Payer: Medicare HMO

## 2021-07-27 ENCOUNTER — Encounter (HOSPITAL_COMMUNITY): Payer: Self-pay

## 2021-07-27 ENCOUNTER — Encounter: Payer: Self-pay | Admitting: Nurse Practitioner

## 2021-07-27 VITALS — BP 127/75 | HR 98 | Temp 96.7°F | Resp 20 | Ht 62.0 in | Wt 154.0 lb

## 2021-07-27 DIAGNOSIS — S2231XA Fracture of one rib, right side, initial encounter for closed fracture: Secondary | ICD-10-CM | POA: Diagnosis not present

## 2021-07-27 DIAGNOSIS — J189 Pneumonia, unspecified organism: Secondary | ICD-10-CM | POA: Diagnosis not present

## 2021-07-27 DIAGNOSIS — R0602 Shortness of breath: Secondary | ICD-10-CM | POA: Diagnosis not present

## 2021-07-27 MED ORDER — AZITHROMYCIN 250 MG PO TABS: ORAL_TABLET | ORAL | 0 refills | Status: DC

## 2021-07-27 MED ORDER — PREDNISONE 20 MG PO TABS: 40.0000 mg | ORAL_TABLET | Freq: Every day | ORAL | 0 refills | Status: AC

## 2021-07-27 MED ORDER — BENZONATATE 100 MG PO CAPS
100.0000 mg | ORAL_CAPSULE | Freq: Two times a day (BID) | ORAL | 0 refills | Status: DC | PRN
Start: 2021-07-27 — End: 2021-09-02

## 2021-07-27 NOTE — Progress Notes (Signed)
Subjective:    Patient ID: Brittany Conrad, female    DOB: 1950-10-28, 71 y.o.   MRN: [id number]   Chief Complaint: Shortness of Breath and Cough   HPI Patient comes in  today c/o SOB and cough. She has a history of COPD and is currently on symbicort and albuterol. She does not feel like her inhalers are working. Has gotten worse the last 3 weeks. Her cough is productive.     Review of Systems  Constitutional:  Positive for fatigue.  HENT:  Positive for congestion, rhinorrhea, sinus pressure, sinus pain and sore throat (raw from using inhalers so much).   Neurological:  Positive for headaches. Negative for dizziness.      Objective:   Physical Exam Constitutional:      Appearance: She is well-developed.  HENT:     Right Ear: Tympanic membrane normal.     Left Ear: Tympanic membrane normal.     Nose: Nose normal.     Mouth/Throat:     Mouth: Mucous membranes are moist.  Eyes:     Extraocular Movements: Extraocular movements intact.     Pupils: Pupils are equal, round, and reactive to light.  Cardiovascular:     Rate and Rhythm: Normal rate and regular rhythm.  Pulmonary:     Effort: Pulmonary effort is normal.     Breath sounds: Rales (left lower lobe) present. No wheezing.  Musculoskeletal:     Cervical back: Normal range of motion and neck supple.  Skin:    General: Skin is warm.     Coloration: Skin is pale.  Neurological:     General: No focal deficit present.     Mental Status: She is alert and oriented to person, place, and time.  Psychiatric:        Mood and Affect: Mood normal.        Behavior: Behavior normal.    BP 127/75    Pulse 98    Temp (!) 96.7 F (35.9 C) (Temporal)    Resp 20    Ht 5\' 2"  (1.575 m)    Wt 154 lb (69.9 kg)    SpO2 91%    BMI 28.17 kg/m   Chest xray- left lower lobe pneumonia-Preliminary reading by , FNP  [facility]      Assessment & Plan:   [facility] in today with chief complaint of Shortness of Breath and  Cough   1. SOB (shortness of breath) - DG Chest 2 View  2. Pneumonia of left lower lobe due to infectious organism 1. Take meds as prescribed 2. Use a cool mist humidifier especially during the winter months and when heat has been humid. 3. Use saline nose sprays frequently 4. Saline irrigations of the nose can be very helpful if done frequently.  * 4X daily for 1 week*  * Use of a nettie pot can be helpful with this. Follow directions with this* 5. Drink plenty of fluids 6. Keep thermostat turn down low 7.For any cough or congestion- tessalon perles 8. For fever or aces or pains- take tylenol or ibuprofen appropriate for age and weight.  * for fevers greater than 101 orally you may alternate ibuprofen and tylenol every  3 hours.    - azithromycin (ZITHROMAX Z-PAK) 250 MG tablet; As directed  Dispense: 6 tablet; Refill: 0 - predniSONE (DELTASONE) 20 MG tablet; Take 2 tablets (40 mg total) by mouth daily with breakfast for 5 days. 2 po daily for 5 days  Dispense:  10 tablet; Refill: 0    The above assessment and management plan was discussed with the patient. The patient verbalized understanding of and has agreed to the management plan. Patient is aware to call the clinic if symptoms persist or worsen. Patient is aware when to return to the clinic for a follow-up visit. Patient educated on when it is appropriate to go to the emergency department.   Mary-Margaret Daphine Deutscher, FNP

## 2021-07-27 NOTE — Patient Instructions (Signed)
Community-Acquired Pneumonia, Adult °Pneumonia is an infection of the lungs. It causes irritation and swelling in the airways of the lungs. Mucus and fluid may also build up inside the airways. This may cause coughing and trouble breathing. °One type of pneumonia can happen while you are in a hospital. A different type can happen when you are not in a hospital (community-acquired pneumonia). °What are the causes? °This condition is caused by germs (viruses, bacteria, or fungi). Some types of germs can spread from person to person. Pneumonia is not thought to spread from person to person. °What increases the risk? °You are more likely to develop this condition if: °You have a long-term (chronic) disease, such as: °Disease of the lungs. This may be chronic obstructive pulmonary disease (COPD) or asthma. °Heart failure. °Cystic fibrosis. °Diabetes. °Kidney disease. °Sickle cell disease. °HIV. °You have other health problems, such as: °Your body's defense system (immune system) is weak. °A condition that may cause you to breathe in fluids from your mouth and nose. °You had your spleen taken out. °You do not take good care of your teeth and mouth (poor dental hygiene). °You use or have used tobacco products. °You travel where the germs that cause this illness are common. °You are near certain animals or the places they live. °You are older than 70 years of age. °What are the signs or symptoms? °Symptoms of this condition include: °A cough. °A fever. °Sweating or chills. °Chest pain, often when you breathe deeply or cough. °Breathing problems, such as: °Fast breathing. °Trouble breathing. °Shortness of breath. °Feeling tired (fatigued). °Muscle aches. °How is this treated? °Treatment for this condition depends on many things, such as: °The cause of your illness. °Your medicines. °Your other health problems. °Most adults can be treated at home. Sometimes, treatment must happen in a hospital. °Treatment may include  medicines to kill germs. °Medicines may depend on which germ caused your illness. °Very bad pneumonia is rare. If you get it, you may: °Have a machine to help you breathe. °Have fluid taken away from around your lungs. °Follow these instructions at home: °Medicines °Take over-the-counter and prescription medicines only as told by your doctor. °Take cough medicine only if you are losing sleep. Cough medicine can keep your body from taking mucus away from your lungs. °If you were prescribed an antibiotic medicine, take it as told by your doctor. Do not stop taking the antibiotic even if you start to feel better. °Lifestyle °  °Do not drink alcohol. °Do not use any products that contain nicotine or tobacco, such as cigarettes, e-cigarettes, and chewing tobacco. If you need help quitting, ask your doctor. °Eat a healthy diet. This includes a lot of vegetables, fruits, whole grains, low-fat dairy products, and low-fat (lean) protein. °General instructions ° °Rest a lot. Sleep for at least 8 hours each night. °Sleep with your head and neck raised. Put a few pillows under your head or sleep in a reclining chair. °Return to your normal activities as told by your doctor. Ask your doctor what activities are safe for you. °Drink enough fluid to keep your pee (urine) pale yellow. °If your throat is sore, rinse your mouth often with salt water. To make salt water, dissolve ½-1 tsp (3-6 g) of salt in 1 cup (237 mL) of warm water. °Keep all follow-up visits as told by your doctor. This is important. °How is this prevented? °You can lower your risk of pneumonia by: °Getting the pneumonia shot (vaccine). These shots have different   types and schedules. Ask your doctor what works best for you. Think about getting this shot if: °You are older than 70 years of age. °You are 19-65 years of age and: °You are being treated for cancer. °You have long-term lung disease. °You have other problems that affect your body's defense system. Ask  your doctor if you have one of these. °Getting your flu shot every year. Ask your doctor which type of shot is best for you. °Going to the dentist as often as told. °Washing your hands often with soap and water for at least 20 seconds. If you cannot use soap and water, use hand sanitizer. °Contact a doctor if: °You have a fever. °You lose sleep because your cough medicine does not help. °Get help right away if: °You are short of breath and this gets worse. °You have more chest pain. °Your sickness gets worse. This is very serious if: °You are an older adult. °Your body's defense system is weak. °You cough up blood. °These symptoms may be an emergency. Do not wait to see if the symptoms will go away. Get medical help right away. Call your local emergency services (911 in the [place] Do not drive yourself to the hospital. °Summary °Pneumonia is an infection of the lungs. °Community-acquired pneumonia affects people who have not been in the hospital. Certain germs can cause this infection. °This condition may be treated with medicines that kill germs. °For very bad pneumonia, you may need a hospital stay and treatment to help with breathing. °This information is not intended to replace advice given to you by your health care provider. Make sure you discuss any questions you have with your health care provider. °Document Revised: 04/03/2019 Document Reviewed: 04/03/2019 °Elsevier Patient Education © 2022 [employer] Inc. ° °

## 2021-07-27 NOTE — Progress Notes (Signed)
Attempted to reach patient regarding LCS. Patient answered and then phone call ended abruptly. Referral closed due to multiple unsuccessful attempts to reach patient.

## 2021-07-31 ENCOUNTER — Other Ambulatory Visit: Payer: Self-pay | Admitting: Family Medicine

## 2021-07-31 MED ORDER — LEVOTHYROXINE SODIUM 100 MCG PO TABS: 100.0000 ug | ORAL_TABLET | Freq: Every day | ORAL | 0 refills | Status: DC

## 2021-07-31 NOTE — Telephone Encounter (Signed)
Scheduled apt for 08/18/2021 Stacks

## 2021-07-31 NOTE — Addendum Note (Signed)
Addended by: Julious Payer D on: 07/31/2021 10:36 AM   Modules accepted: Orders

## 2021-07-31 NOTE — Telephone Encounter (Signed)
Stacks. Needs to come have labs drawn. 30 days given 06/30/21

## 2021-08-06 DIAGNOSIS — Z79899 Other long term (current) drug therapy: Secondary | ICD-10-CM | POA: Diagnosis not present

## 2021-08-06 DIAGNOSIS — G609 Hereditary and idiopathic neuropathy, unspecified: Secondary | ICD-10-CM | POA: Diagnosis not present

## 2021-08-06 DIAGNOSIS — M25562 Pain in left knee: Secondary | ICD-10-CM | POA: Diagnosis not present

## 2021-08-06 DIAGNOSIS — M25561 Pain in right knee: Secondary | ICD-10-CM | POA: Diagnosis not present

## 2021-08-14 ENCOUNTER — Telehealth: Payer: Self-pay | Admitting: Family Medicine

## 2021-08-14 NOTE — Telephone Encounter (Signed)
°  Incoming Patient Call  08/14/2021  What symptoms do you have? Hacking cough it is all in her throat and back still hurts  How long have you been sick? About two weeks  Have you been seen for this problem? Yes on 07/27/21, she has finished taken antibiotics and cough medicine  If your provider decides to give you a prescription, which pharmacy would you like for it to be sent to? [facility]   Patient informed that this information will be sent to the clinical staff for review and that they should receive a follow up call.

## 2021-08-16 DIAGNOSIS — T148XXA Other injury of unspecified body region, initial encounter: Secondary | ICD-10-CM | POA: Diagnosis not present

## 2021-08-16 DIAGNOSIS — W19XXXA Unspecified fall, initial encounter: Secondary | ICD-10-CM | POA: Diagnosis not present

## 2021-08-16 DIAGNOSIS — R5381 Other malaise: Secondary | ICD-10-CM | POA: Diagnosis not present

## 2021-08-18 ENCOUNTER — Ambulatory Visit: Payer: Medicare HMO | Admitting: Family Medicine

## 2021-08-18 DIAGNOSIS — E870 Hyperosmolality and hypernatremia: Secondary | ICD-10-CM | POA: Diagnosis not present

## 2021-08-18 DIAGNOSIS — W19XXXA Unspecified fall, initial encounter: Secondary | ICD-10-CM | POA: Diagnosis not present

## 2021-08-18 DIAGNOSIS — E039 Hypothyroidism, unspecified: Secondary | ICD-10-CM | POA: Diagnosis not present

## 2021-08-18 DIAGNOSIS — S92912A Unspecified fracture of left toe(s), initial encounter for closed fracture: Secondary | ICD-10-CM | POA: Diagnosis not present

## 2021-08-18 DIAGNOSIS — M79672 Pain in left foot: Secondary | ICD-10-CM | POA: Diagnosis not present

## 2021-08-18 DIAGNOSIS — A419 Sepsis, unspecified organism: Secondary | ICD-10-CM | POA: Diagnosis not present

## 2021-08-18 DIAGNOSIS — R296 Repeated falls: Secondary | ICD-10-CM | POA: Diagnosis not present

## 2021-08-18 DIAGNOSIS — R6520 Severe sepsis without septic shock: Secondary | ICD-10-CM | POA: Diagnosis not present

## 2021-08-18 DIAGNOSIS — J9600 Acute respiratory failure, unspecified whether with hypoxia or hypercapnia: Secondary | ICD-10-CM | POA: Diagnosis not present

## 2021-08-18 DIAGNOSIS — R269 Unspecified abnormalities of gait and mobility: Secondary | ICD-10-CM | POA: Diagnosis not present

## 2021-08-18 DIAGNOSIS — G8929 Other chronic pain: Secondary | ICD-10-CM | POA: Diagnosis not present

## 2021-08-18 DIAGNOSIS — I10 Essential (primary) hypertension: Secondary | ICD-10-CM | POA: Diagnosis not present

## 2021-08-18 DIAGNOSIS — Z792 Long term (current) use of antibiotics: Secondary | ICD-10-CM | POA: Diagnosis not present

## 2021-08-18 DIAGNOSIS — S0990XA Unspecified injury of head, initial encounter: Secondary | ICD-10-CM | POA: Diagnosis not present

## 2021-08-18 DIAGNOSIS — F411 Generalized anxiety disorder: Secondary | ICD-10-CM | POA: Diagnosis not present

## 2021-08-18 DIAGNOSIS — F32A Depression, unspecified: Secondary | ICD-10-CM | POA: Diagnosis not present

## 2021-08-18 DIAGNOSIS — I517 Cardiomegaly: Secondary | ICD-10-CM | POA: Diagnosis not present

## 2021-08-18 DIAGNOSIS — R0902 Hypoxemia: Secondary | ICD-10-CM | POA: Diagnosis not present

## 2021-08-18 DIAGNOSIS — J441 Chronic obstructive pulmonary disease with (acute) exacerbation: Secondary | ICD-10-CM | POA: Diagnosis not present

## 2021-08-18 DIAGNOSIS — J449 Chronic obstructive pulmonary disease, unspecified: Secondary | ICD-10-CM | POA: Diagnosis not present

## 2021-08-18 DIAGNOSIS — D649 Anemia, unspecified: Secondary | ICD-10-CM | POA: Diagnosis not present

## 2021-08-18 DIAGNOSIS — J9601 Acute respiratory failure with hypoxia: Secondary | ICD-10-CM | POA: Diagnosis not present

## 2021-08-18 DIAGNOSIS — J189 Pneumonia, unspecified organism: Secondary | ICD-10-CM | POA: Diagnosis not present

## 2021-08-18 DIAGNOSIS — M549 Dorsalgia, unspecified: Secondary | ICD-10-CM | POA: Diagnosis not present

## 2021-08-18 DIAGNOSIS — R059 Cough, unspecified: Secondary | ICD-10-CM | POA: Diagnosis not present

## 2021-08-18 DIAGNOSIS — D539 Nutritional anemia, unspecified: Secondary | ICD-10-CM | POA: Diagnosis not present

## 2021-08-18 DIAGNOSIS — Z79899 Other long term (current) drug therapy: Secondary | ICD-10-CM | POA: Diagnosis not present

## 2021-08-18 DIAGNOSIS — R102 Pelvic and perineal pain: Secondary | ICD-10-CM | POA: Diagnosis not present

## 2021-08-18 DIAGNOSIS — S2231XA Fracture of one rib, right side, initial encounter for closed fracture: Secondary | ICD-10-CM | POA: Diagnosis not present

## 2021-08-18 DIAGNOSIS — J440 Chronic obstructive pulmonary disease with acute lower respiratory infection: Secondary | ICD-10-CM | POA: Diagnosis not present

## 2021-08-19 NOTE — Telephone Encounter (Signed)
lmtcb

## 2021-08-19 NOTE — Telephone Encounter (Signed)
Please call and have pt. Update [place] on her symptoms Give her my apology for not being able to respond sooner. See if she still needs anything.

## 2021-08-21 DIAGNOSIS — N289 Disorder of kidney and ureter, unspecified: Secondary | ICD-10-CM | POA: Insufficient documentation

## 2021-08-24 ENCOUNTER — Ambulatory Visit: Payer: Medicare HMO | Admitting: Family Medicine

## 2021-08-24 NOTE — Telephone Encounter (Signed)
Appt today

## 2021-08-25 ENCOUNTER — Encounter: Payer: Self-pay | Admitting: Family Medicine

## 2021-08-25 ENCOUNTER — Other Ambulatory Visit: Payer: Self-pay | Admitting: Family Medicine

## 2021-08-25 ENCOUNTER — Telehealth: Payer: Self-pay

## 2021-08-25 NOTE — Telephone Encounter (Signed)
Transition Care Management Follow-up Telephone Call Date of discharge and from where: [facility] 08-24-21- Dx: Acute respiratory failure How have you been since you were released from the hospital? Doing ok  Any questions or concerns? Yes  Items Reviewed: Did the pt receive and understand the discharge instructions provided? Yes  Medications obtained and verified? Yes  Other? No  Any new allergies since your discharge? No  Dietary orders reviewed? Yes Do you have support at home? Yes   Home Care and Equipment/Supplies: Were home health services ordered? yes If so, what is the name of the agency? [facility]  Has the agency set up a time to come to the patient's home? no Were any new equipment or medical supplies ordered?  Yes: oxygen What is the name of the medical supply agency? Unknown  Were you able to get the supplies/equipment? yes Do you have any questions related to the use of the equipment or supplies? Yes: pt states that after " an hour it stopped working- I don't feel like I need it and it is too much money"-  pt states that she told them to come pick it up   Functional Questionnaire: (I = Independent and D = Dependent) ADLs: I  Bathing/Dressing- I  Meal Prep- I  Eating- I  Maintaining continence- I  Transferring/Ambulation- I  Managing Meds- I  Follow up appointments reviewed:  PCP Hospital f/u appt confirmed? Yes  Scheduled to see Dr Thayer Ohm on 09-02-21 @ 1020am. [facility] f/u appt confirmed? No  . Are transportation arrangements needed? No  If their condition worsens, is the pt aware to call PCP or go to the Emergency Dept.? Yes Was the patient provided with contact information for the PCP's office or ED? Yes Was to pt encouraged to call back with questions or concerns? Yes

## 2021-08-27 ENCOUNTER — Ambulatory Visit: Payer: Medicare HMO | Admitting: Family Medicine

## 2021-08-31 ENCOUNTER — Telehealth: Payer: Self-pay | Admitting: Family Medicine

## 2021-08-31 NOTE — Telephone Encounter (Signed)
Olin Hauser from [facility] called wanting verbal order to evaluate pt. VO given.

## 2021-09-02 ENCOUNTER — Ambulatory Visit (INDEPENDENT_AMBULATORY_CARE_PROVIDER_SITE_OTHER): Payer: Medicare HMO | Admitting: Family Medicine

## 2021-09-02 ENCOUNTER — Encounter: Payer: Self-pay | Admitting: Family Medicine

## 2021-09-02 VITALS — BP 108/71 | HR 89 | Temp 98.2°F | Ht 62.0 in | Wt 150.5 lb

## 2021-09-02 DIAGNOSIS — D500 Iron deficiency anemia secondary to blood loss (chronic): Secondary | ICD-10-CM

## 2021-09-02 DIAGNOSIS — J441 Chronic obstructive pulmonary disease with (acute) exacerbation: Secondary | ICD-10-CM

## 2021-09-02 DIAGNOSIS — J189 Pneumonia, unspecified organism: Secondary | ICD-10-CM

## 2021-09-02 DIAGNOSIS — Z09 Encounter for follow-up examination after completed treatment for conditions other than malignant neoplasm: Secondary | ICD-10-CM | POA: Diagnosis not present

## 2021-09-02 DIAGNOSIS — E876 Hypokalemia: Secondary | ICD-10-CM

## 2021-09-02 DIAGNOSIS — R6889 Other general symptoms and signs: Secondary | ICD-10-CM | POA: Diagnosis not present

## 2021-09-02 DIAGNOSIS — J9601 Acute respiratory failure with hypoxia: Secondary | ICD-10-CM

## 2021-09-02 DIAGNOSIS — E039 Hypothyroidism, unspecified: Secondary | ICD-10-CM

## 2021-09-02 MED ORDER — BREZTRI AEROSPHERE 160-9-4.8 MCG/ACT IN AERO: 2.0000 | INHALATION_SPRAY | Freq: Two times a day (BID) | RESPIRATORY_TRACT | 11 refills | Status: DC

## 2021-09-02 MED ORDER — ALBUTEROL SULFATE HFA 108 (90 BASE) MCG/ACT IN AERS: INHALATION_SPRAY | RESPIRATORY_TRACT | 2 refills | Status: DC

## 2021-09-02 MED ORDER — LEVOTHYROXINE SODIUM 100 MCG PO TABS: 100.0000 ug | ORAL_TABLET | Freq: Every day | ORAL | 0 refills | Status: DC

## 2021-09-02 NOTE — Progress Notes (Signed)
Subjective:  Patient ID: Brittany Conrad, female    DOB: 27-Apr-1951, 71 y.o.   MRN: [id number]  Patient Care Team: [name], MD as PCP - General (Family Medicine) Almyra Free, MD as Referring Physician (Physical Medicine and Rehabilitation) Ilean China, RN as Case Manager   Chief Complaint:  Transitions Of Care   HPI: Brittany Conrad is a 71 y.o. female presenting on 09/02/2021 for Transitions Of Care  Pt presents today for transitions of care after recent hospital discharge. She was admitted to [facility] on 08/18/2021 for acute respiratory failure with hypoxia, RLL pneumonia, COPD exacerbation, IDA, hypokalemia, and renal insufficiency. Labs during admission: WBC 10.9, Hgb 8.7, Hct 27.8, platelet 109, potassium 3.1, sodium 147, calcium 8.1, TSH 0.316, chloride 113, creatinine 1.17. She received 2 units of blood, IV fluids, and IV Levaquin during admission. She was discharged home with oxygen but states she has not received this as it was to expensive. States she does not feel she needs it. Home health, OT and PT are involved and have evaluated pt in home. Pt states she is doing fine since discharge. States she is still tired but slowly regaining her strength. Lasix was stopped at time of discharge.    Relevant past medical, surgical, family, and social history reviewed and updated as indicated.  Allergies and medications reviewed and updated. Data reviewed: Chart in Epic.   Past Medical History:  Diagnosis Date   Asthma    Bipolar 1 disorder ([place])    COPD (chronic obstructive pulmonary disease) (HCC)    Emphysema lung (HCC)    Emphysema of lung ([place])    Neuropathy    Scoliosis     Past Surgical History:  Procedure Laterality Date   ABDOMINAL HYSTERECTOMY     BACK SURGERY     BREAST REDUCTION SURGERY     GALLBLADDER SURGERY     GASTRIC BYPASS     tummy tuck      Social History   Socioeconomic History   Marital status: Widowed    Spouse name: Not on  file   Number of children: 2   Years of education: 52   Highest education level: 11th grade  Occupational History   Occupation: Disabled  Tobacco Use   Smoking status: Former    Packs/day: 2.00    Years: 35.00    Pack years: 70.00    Types: Cigarettes    Quit date: 07/05/2016    Years since quitting: 5.1   Smokeless tobacco: Never  Vaping Use   Vaping Use: Never used  Substance and Sexual Activity   Alcohol use: Yes    Comment: occasional   Drug use: Never   Sexual activity: Not Currently  Other Topics Concern   Not on file  Social History Narrative   Lives alone; sister lives nearby; children live out of town; Her insurance pays for her public transportation - she doesn't drive   Social Determinants of Health   Financial Resource Strain: Low Risk    Difficulty of Paying Living Expenses: Not very hard  Food Insecurity: No Food Insecurity   Worried About Charity fundraiser in the Last Year: Never true   Ran Out of Food in the Last Year: Never true  Transportation Needs: No Transportation Needs   Lack of Transportation (Medical): No   Lack of Transportation (Non-Medical): No  Physical Activity: Insufficiently Active   Days of Exercise per Week: 3 days   Minutes of Exercise per Session: 30  min  Stress: Stress Concern Present   Feeling of Stress : To some extent  Social Connections: Socially Isolated   Frequency of Communication with Friends and Family: More than three times a week   Frequency of Social Gatherings with Friends and Family: More than three times a week   Attends Religious Services: Never   Marine scientist or Organizations: No   Attends Music therapist: Never   Marital Status: Never married  Human resources officer Violence: Not At Risk   Fear of Current or Ex-Partner: No   Emotionally Abused: No   Physically Abused: No   Sexually Abused: No    Outpatient Encounter Medications as of 09/02/2021  Medication Sig   albuterol (PROVENTIL) (2.5  MG/3ML) 0.083% nebulizer solution INHALE 3 ML BY NEBULIZATION EVERY 6 HOURS AS NEEDED FOR WHEEZING OR SHORTNESS OF BREATH   amitriptyline (ELAVIL) 100 MG tablet Take 2 tablets (200 mg total) by mouth at bedtime.   amLODipine (NORVASC) 10 MG tablet Take 1 tablet (10 mg total) by mouth daily.   Budeson-Glycopyrrol-Formoterol (BREZTRI AEROSPHERE) 160-9-4.8 MCG/ACT AERO Inhale 2 puffs into the lungs 2 (two) times daily.   busPIRone (BUSPAR) 10 MG tablet TAKE 1 TABLET BY MOUTH THREE TIMES A DAY   [facility] D3 25 MCG (1000 UT) capsule TAKE 1 CAPSULE (1,000 UNITS TOTAL) BY MOUTH DAILY.   diclofenac Sodium (VOLTAREN) 1 % GEL    furosemide (LASIX) 40 MG tablet TAKE 1 TABLET BY MOUTH TWICE A DAY   gabapentin (NEURONTIN) 400 MG capsule Take 400 mg by mouth 3 (three) times daily.   HYDROcodone-acetaminophen (NORCO) 7.5-325 MG tablet Take 1 tablet by mouth every 6 (six) hours as needed for moderate pain.   ibuprofen (ADVIL,MOTRIN) 800 MG tablet Take 800 mg by mouth 3 (three) times daily as needed.    KLOR-CON M20 20 MEQ tablet TAKE 2 TABLETS (40 MEQ TOTAL) BY MOUTH 3 (THREE) TIMES DAILY. FOR POTASSIUM REPLACEMENT/ SUPPLEMENT   meclizine (ANTIVERT) 25 MG tablet TAKE 1 TABLET BY MOUTH EVERY 8 HOURS AS NEEDED FOR VERTIGO   pantoprazole (PROTONIX) 40 MG tablet TAKE 1 TABLET BY MOUTH TWICE A DAY   sucralfate (CARAFATE) 1 g tablet    [DISCONTINUED] albuterol (VENTOLIN HFA) 108 (90 Base) MCG/ACT inhaler INHALE 1-2 PUFFS BY MOUTH EVERY 6 HOURS AS NEEDED FOR WHEEZE OR SHORTNESS OF BREATH   [DISCONTINUED] levothyroxine (SYNTHROID) 100 MCG tablet Take 1 tablet (100 mcg total) by mouth daily. (NEEDS TO BE SEEN BEFORE NEXT REFILL)   albuterol (VENTOLIN HFA) 108 (90 Base) MCG/ACT inhaler INHALE 1-2 PUFFS BY MOUTH EVERY 6 HOURS AS NEEDED FOR WHEEZE OR SHORTNESS OF BREATH   fluticasone (FLONASE) 50 MCG/ACT nasal spray 1 spray into each nostril in the morning. (Patient not taking: Reported on 09/02/2021)   ipratropium (ATROVENT) 0.02  % nebulizer solution Take 2.5 mLs (0.5 mg total) by nebulization 4 (four) times daily. (Patient not taking: Reported on 09/02/2021)   levothyroxine (SYNTHROID) 100 MCG tablet Take 1 tablet (100 mcg total) by mouth daily.   [DISCONTINUED] azithromycin (ZITHROMAX Z-PAK) 250 MG tablet As directed   [DISCONTINUED] benzonatate (TESSALON) 100 MG capsule Take 1 capsule (100 mg total) by mouth 2 (two) times daily as needed for cough.   [DISCONTINUED] SYMBICORT 160-4.5 MCG/ACT inhaler  (Patient not taking: Reported on 09/02/2021)   [DISCONTINUED] XARELTO 10 MG TABS tablet    No facility-administered encounter medications on file as of 09/02/2021.    Allergies  Allergen Reactions   Tramadol  Can't take d/t pain clinic    Review of Systems  Constitutional:  Positive for activity change and fatigue. Negative for appetite change, chills, diaphoresis, fever and unexpected weight change.  Respiratory:  Positive for cough and shortness of breath (greatly improved, only with significant exertion). Negative for apnea, choking, chest tightness, wheezing and stridor.   Cardiovascular:  Negative for chest pain, palpitations and leg swelling.  Gastrointestinal:  Negative for abdominal distention, abdominal pain, anal bleeding, blood in stool, constipation, diarrhea, nausea, rectal pain and vomiting.  Genitourinary:  Negative for decreased urine volume and difficulty urinating.  Musculoskeletal:  Positive for gait problem (slow, uses cane). Negative for arthralgias, back pain, joint swelling, myalgias, neck pain and neck stiffness.  Neurological:  Positive for weakness (generalized). Negative for dizziness, tremors, seizures, syncope, facial asymmetry, speech difficulty, light-headedness, numbness and headaches.  Hematological:  Negative for adenopathy. Bruises/bleeds easily.  Psychiatric/Behavioral:  Negative for confusion.   All other systems reviewed and are negative.      Objective:  BP 108/71    Pulse 89     Temp 98.2 F (36.8 C) (Temporal)    Ht _0  (1.575 m)    Wt 150 lb 8 oz (68.3 kg)    SpO2 95%    BMI 27.53 kg/m    Wt Readings from Last 3 Encounters:  09/02/21 150 lb 8 oz (68.3 kg)  07/27/21 154 lb (69.9 kg)  05/18/21 159 lb 3.2 oz (72.2 kg)    Physical Exam Vitals and nursing note reviewed.  Constitutional:      General: She is not in acute distress.    Appearance: She is well-developed, well-groomed and overweight. She is not ill-appearing, toxic-appearing or diaphoretic.     Comments: Frail elderly  HENT:     Head: Normocephalic and atraumatic.     Jaw: There is normal jaw occlusion.     Right Ear: Hearing normal.     Left Ear: Hearing normal.     Nose: Nose normal.     Mouth/Throat:     Lips: Pink.     Mouth: Mucous membranes are moist.     Pharynx: Oropharynx is clear. Uvula midline.  Eyes:     General: Lids are normal.     Extraocular Movements: Extraocular movements intact.     Conjunctiva/sclera: Conjunctivae normal.     Pupils: Pupils are equal, round, and reactive to light.  Neck:     Thyroid: No thyroid mass, thyromegaly or thyroid tenderness.     Vascular: No carotid bruit or JVD.     Trachea: Trachea and phonation normal.  Cardiovascular:     Rate and Rhythm: Normal rate and regular rhythm.     Chest Wall: PMI is not displaced.     Pulses: Normal pulses.     Heart sounds: Normal heart sounds. No murmur heard.   No friction rub. No gallop.  Pulmonary:     Effort: Pulmonary effort is normal. No respiratory distress.     Breath sounds: No stridor. Rhonchi (clears with coughing) present. No wheezing or rales.  Chest:     Chest wall: No tenderness.  Abdominal:     General: Bowel sounds are normal. There is no distension or abdominal bruit.     Palpations: Abdomen is soft. There is no hepatomegaly or splenomegaly.     Tenderness: There is no abdominal tenderness. There is no right CVA tenderness or left CVA tenderness.     Hernia: No hernia is present.   Musculoskeletal:  General: Normal range of motion.     Cervical back: Normal range of motion and neck supple.     Right lower leg: No edema.     Left lower leg: No edema.  Lymphadenopathy:     Cervical: No cervical adenopathy.  Skin:    General: Skin is warm and dry.     Capillary Refill: Capillary refill takes less than 2 seconds.     Coloration: Skin is not cyanotic, jaundiced or pale.     Findings: Bruising (scattered to bilateral forearms) present. No rash.  Neurological:     General: No focal deficit present.     Mental Status: She is alert and oriented to person, place, and time.     Sensory: Sensation is intact.     Motor: Motor function is intact.     Coordination: Coordination is intact.     Gait: Gait abnormal (using cane).     Deep Tendon Reflexes: Reflexes are normal and symmetric.  Psychiatric:        Attention and Perception: Attention and perception normal.        Mood and Affect: Mood and affect normal.        Speech: Speech normal.        Behavior: Behavior normal. Behavior is cooperative.        Thought Content: Thought content normal.        Cognition and Memory: Cognition and memory normal.        Judgment: Judgment normal.    Results for orders placed or performed in visit on 09/18/20  CBC with Differential/Platelet  Result Value Ref Range   WBC 12.7 (H) 3.4 - 10.8 x10E3/uL   RBC 2.73 (LL) 3.77 - 5.28 x10E6/uL   Hemoglobin 9.7 (L) 11.1 - 15.9 g/dL   Hematocrit 29.6 (L) 34.0 - 46.6 %   MCV 108 (H) 79 - 97 fL   MCH 35.5 (H) 26.6 - 33.0 pg   MCHC 32.8 31.5 - 35.7 g/dL   RDW 17.1 (H) 11.7 - 15.4 %   Platelets 553 (H) 150 - 450 x10E3/uL   Neutrophils 86 Not Estab. %   Lymphs 6 Not Estab. %   Monocytes 7 Not Estab. %   Eos 0 Not Estab. %   Basos 0 Not Estab. %   Neutrophils Absolute 10.9 (H) 1.4 - 7.0 x10E3/uL   Lymphocytes Absolute 0.8 0.7 - 3.1 x10E3/uL   Monocytes Absolute 0.8 0.1 - 0.9 x10E3/uL   EOS (ABSOLUTE) 0.0 0.0 - 0.4 x10E3/uL    Basophils Absolute 0.0 0.0 - 0.2 x10E3/uL   Immature Granulocytes 1 Not Estab. %   Immature Grans (Abs) 0.2 (H) 0.0 - 0.1 x10E3/uL   NRBC 1 (H) 0 - 0 %  BMP8+EGFR  Result Value Ref Range   Glucose 102 (H) 65 - 99 mg/dL   BUN 24 8 - 27 mg/dL   Creatinine, Ser 1.20 (H) 0.57 - 1.00 mg/dL   eGFR 49 (L) >59 mL/min/1.73   BUN/Creatinine Ratio 20 12 - 28   Sodium 141 134 - 144 mmol/L   Potassium 4.6 3.5 - 5.2 mmol/L   Chloride 97 96 - 106 mmol/L   CO2 24 20 - 29 mmol/L   Calcium 9.3 8.7 - 10.3 mg/dL       Pertinent labs & imaging results that were available during my care of the patient were reviewed by me and considered in my medical decision making.  Assessment & Plan:  Dajanee was seen today for transitions  of care.  Diagnoses and all orders for this visit:  Hospital discharge follow-up Today's visit was for Transitional Care Management. The patient was discharged from [facility] on 08/24/2021 with a primary diagnosis of acute respiratory failure with hypoxia, RLL pneumonia, COPD exacerbation, anemia, renal insufficiency, and hypokalemia.  Contact with the patient and/or caregiver, by a clinical staff member, was made on 08/25/2021 and was documented as a telephone encounter within the EMR. Through chart review and discussion with the patient I have determined that management of their condition is of high complexity.  -     Anemia Profile B -     BMP8+EGFR  Pneumonia of right lower lobe due to infectious organism Acute respiratory failure with hypoxia (HCC) Chronic obstructive pulmonary disease with acute exacerbation ([place]) Needs refills on inhalers today, will send to pharmacy. Has not picked up oxygen. States it was expensive and she feels she does not need this. Pt aware to get oxygen as prescribed.  -     albuterol (VENTOLIN HFA) 108 (90 Base) MCG/ACT inhaler; INHALE 1-2 PUFFS BY MOUTH EVERY 6 HOURS AS NEEDED FOR WHEEZE OR SHORTNESS OF BREATH -      Budeson-Glycopyrrol-Formoterol (BREZTRI AEROSPHERE) 160-9-4.8 MCG/ACT AERO; Inhale 2 puffs into the lungs 2 (two) times daily.  Iron deficiency anemia due to chronic blood loss Will recheck labs today.  -     Anemia Profile B  Hypokalemia Will recheck labs today.  -     BMP8+EGFR  Acquired hypothyroidism Last TSH elevated. Will check today along with T3 and T4.  -     levothyroxine (SYNTHROID) 100 MCG tablet; Take 1 tablet (100 mcg total) by mouth daily. -     Thyroid Panel With TSH     Continue all other maintenance medications.  Follow up plan: Return in about 4 weeks (around 09/30/2021), or if symptoms worsen or fail to improve, for PCP.   Continue healthy lifestyle choices, including diet (rich in fruits, vegetables, and lean proteins, and low in salt and simple carbohydrates) and exercise (at least 30 minutes of moderate physical activity daily).  Educational handout given for COPD  The above assessment and management plan was discussed with the patient. The patient verbalized understanding of and has agreed to the management plan. Patient is aware to call the clinic if they develop any new symptoms or if symptoms persist or worsen. Patient is aware when to return to the clinic for a follow-up visit. Patient educated on when it is appropriate to go to the emergency department.   Monia Pouch, FNP-C [facility] [phone]

## 2021-09-03 LAB — ANEMIA PROFILE B
Basophils Absolute: 0 10*3/uL (ref 0.0–0.2)
Basos: 0 %
EOS (ABSOLUTE): 0.1 10*3/uL (ref 0.0–0.4)
Eos: 1 %
Ferritin: 1691 ng/mL — ABNORMAL HIGH (ref 15–150)
Folate: 4.1 ng/mL (ref >3.0)
Hematocrit: 33 % — ABNORMAL LOW (ref 34.0–46.6)
Hemoglobin: 10.6 g/dL — ABNORMAL LOW (ref 11.1–15.9)
Immature Grans (Abs): 0 10*3/uL (ref 0.0–0.1)
Immature Granulocytes: 0 %
Iron Saturation: 25 % (ref 15–55)
Iron: 43 ug/dL (ref 27–139)
Lymphocytes Absolute: 1.1 10*3/uL (ref 0.7–3.1)
Lymphs: 16 %
MCH: 29.9 pg (ref 26.6–33.0)
MCHC: 32.1 g/dL (ref 31.5–35.7)
MCV: 93 fL (ref 79–97)
Monocytes Absolute: 0.6 10*3/uL (ref 0.1–0.9)
Monocytes: 8 %
Neutrophils Absolute: 5.1 10*3/uL (ref 1.4–7.0)
Neutrophils: 75 %
Platelets: 455 10*3/uL — ABNORMAL HIGH (ref 150–450)
RBC: 3.55 x10E6/uL — ABNORMAL LOW (ref 3.77–5.28)
RDW: 16.2 % — ABNORMAL HIGH (ref 11.7–15.4)
Retic Ct Pct: 5 % — ABNORMAL HIGH (ref 0.6–2.6)
Total Iron Binding Capacity: 169 ug/dL — ABNORMAL LOW (ref 250–450)
UIBC: 126 ug/dL (ref 118–369)
Vitamin B-12: 595 pg/mL (ref 232–1245)
WBC: 6.8 10*3/uL (ref 3.4–10.8)

## 2021-09-03 LAB — BMP8+EGFR
BUN/Creatinine Ratio: 12 (ref 12–28)
BUN: 12 mg/dL (ref 8–27)
CO2: 18 mmol/L — ABNORMAL LOW (ref 20–29)
Calcium: 8.7 mg/dL (ref 8.7–10.3)
Chloride: 105 mmol/L (ref 96–106)
Creatinine, Ser: 1.04 mg/dL — ABNORMAL HIGH (ref 0.57–1.00)
Glucose: 89 mg/dL (ref 70–99)
Potassium: 4.9 mmol/L (ref 3.5–5.2)
Sodium: 139 mmol/L (ref 134–144)
eGFR: 58 mL/min/{1.73_m2} — ABNORMAL LOW (ref >59)

## 2021-09-03 LAB — THYROID PANEL WITH TSH
Free Thyroxine Index: 2.6 (ref 1.2–4.9)
T3 Uptake Ratio: 28 % (ref 24–39)
T4, Total: 9.3 ug/dL (ref 4.5–12.0)
TSH: 0.727 u[IU]/mL (ref 0.450–4.500)

## 2021-09-03 NOTE — Addendum Note (Signed)
Addended by: Sonny Masters on: 09/03/2021 08:12 AM ? ? Modules accepted: Orders ? ?

## 2021-09-04 DIAGNOSIS — F319 Bipolar disorder, unspecified: Secondary | ICD-10-CM | POA: Diagnosis not present

## 2021-09-04 DIAGNOSIS — M25562 Pain in left knee: Secondary | ICD-10-CM | POA: Diagnosis not present

## 2021-09-04 DIAGNOSIS — G609 Hereditary and idiopathic neuropathy, unspecified: Secondary | ICD-10-CM | POA: Diagnosis not present

## 2021-09-04 DIAGNOSIS — M25561 Pain in right knee: Secondary | ICD-10-CM | POA: Diagnosis not present

## 2021-09-04 DIAGNOSIS — R6889 Other general symptoms and signs: Secondary | ICD-10-CM | POA: Diagnosis not present

## 2021-09-04 DIAGNOSIS — Z79899 Other long term (current) drug therapy: Secondary | ICD-10-CM | POA: Diagnosis not present

## 2021-09-07 DIAGNOSIS — R6889 Other general symptoms and signs: Secondary | ICD-10-CM | POA: Diagnosis not present

## 2021-09-09 ENCOUNTER — Other Ambulatory Visit: Payer: Self-pay

## 2021-09-09 ENCOUNTER — Ambulatory Visit (INDEPENDENT_AMBULATORY_CARE_PROVIDER_SITE_OTHER): Payer: Medicare HMO

## 2021-09-09 DIAGNOSIS — G629 Polyneuropathy, unspecified: Secondary | ICD-10-CM | POA: Diagnosis not present

## 2021-09-09 DIAGNOSIS — I10 Essential (primary) hypertension: Secondary | ICD-10-CM | POA: Diagnosis not present

## 2021-09-09 DIAGNOSIS — F319 Bipolar disorder, unspecified: Secondary | ICD-10-CM | POA: Diagnosis not present

## 2021-09-09 DIAGNOSIS — K219 Gastro-esophageal reflux disease without esophagitis: Secondary | ICD-10-CM | POA: Diagnosis not present

## 2021-09-09 DIAGNOSIS — J189 Pneumonia, unspecified organism: Secondary | ICD-10-CM | POA: Diagnosis not present

## 2021-09-09 DIAGNOSIS — J9601 Acute respiratory failure with hypoxia: Secondary | ICD-10-CM | POA: Diagnosis not present

## 2021-09-09 DIAGNOSIS — R569 Unspecified convulsions: Secondary | ICD-10-CM | POA: Diagnosis not present

## 2021-09-09 DIAGNOSIS — F411 Generalized anxiety disorder: Secondary | ICD-10-CM | POA: Diagnosis not present

## 2021-09-09 DIAGNOSIS — J440 Chronic obstructive pulmonary disease with acute lower respiratory infection: Secondary | ICD-10-CM | POA: Diagnosis not present

## 2021-09-10 DIAGNOSIS — R6889 Other general symptoms and signs: Secondary | ICD-10-CM | POA: Diagnosis not present

## 2021-09-16 ENCOUNTER — Telehealth: Payer: Self-pay | Admitting: *Deleted

## 2021-09-16 NOTE — Telephone Encounter (Signed)
TC from [facility] ?Nurse is at pt's home, she has had suture's in her open toe fx since 08/18/21 hospitalization and mentioned to nurse that she needs them removed. HH calling for VO for them to removed. ?VO from Dr. Darlyn Read ok for [facility] to remove sutures ?

## 2021-09-18 NOTE — Progress Notes (Deleted)
NO SHOW

## 2021-09-21 ENCOUNTER — Encounter (HOSPITAL_COMMUNITY): Payer: Medicare HMO | Admitting: Hematology

## 2021-09-21 DIAGNOSIS — J189 Pneumonia, unspecified organism: Secondary | ICD-10-CM | POA: Diagnosis not present

## 2021-09-21 DIAGNOSIS — J441 Chronic obstructive pulmonary disease with (acute) exacerbation: Secondary | ICD-10-CM | POA: Diagnosis not present

## 2021-09-21 DIAGNOSIS — R0902 Hypoxemia: Secondary | ICD-10-CM | POA: Diagnosis not present

## 2021-09-30 ENCOUNTER — Telehealth: Payer: Self-pay | Admitting: Family Medicine

## 2021-09-30 NOTE — Telephone Encounter (Signed)
[facility] called to let PCP know that they were unsuccessful in getting patient scheduled with them because they are unable to contact patient.  ?

## 2021-10-02 ENCOUNTER — Other Ambulatory Visit: Payer: Self-pay | Admitting: Family Medicine

## 2021-10-03 ENCOUNTER — Other Ambulatory Visit: Payer: Self-pay | Admitting: Family Medicine

## 2021-10-05 ENCOUNTER — Encounter: Payer: Self-pay | Admitting: Family Medicine

## 2021-10-05 DIAGNOSIS — Z79899 Other long term (current) drug therapy: Secondary | ICD-10-CM | POA: Diagnosis not present

## 2021-10-05 DIAGNOSIS — G609 Hereditary and idiopathic neuropathy, unspecified: Secondary | ICD-10-CM | POA: Diagnosis not present

## 2021-10-05 DIAGNOSIS — F319 Bipolar disorder, unspecified: Secondary | ICD-10-CM | POA: Diagnosis not present

## 2021-10-05 DIAGNOSIS — M25561 Pain in right knee: Secondary | ICD-10-CM | POA: Diagnosis not present

## 2021-10-05 DIAGNOSIS — R6889 Other general symptoms and signs: Secondary | ICD-10-CM | POA: Diagnosis not present

## 2021-10-05 DIAGNOSIS — M25562 Pain in left knee: Secondary | ICD-10-CM | POA: Diagnosis not present

## 2021-10-05 NOTE — Telephone Encounter (Signed)
Refill sent to pharmacy. Patient must be seen for any further refills.  ?

## 2021-10-05 NOTE — Telephone Encounter (Signed)
CALLED PT TO SCHEDULE APPT, LMTCB ?LETTER MAILED ?

## 2021-10-08 DIAGNOSIS — R6889 Other general symptoms and signs: Secondary | ICD-10-CM | POA: Diagnosis not present

## 2021-10-16 ENCOUNTER — Telehealth: Payer: Self-pay | Admitting: Family Medicine

## 2021-10-16 NOTE — Telephone Encounter (Signed)
SCHEDULED TELEPHONE OFFICE VISIT FOR NEXT WEEK TO DISCUSS BIPOLAR MEDICAITON.  PATIENT REPORTS SHE DOESN'T WANT TO LEAVE HER HOME ?

## 2021-10-16 NOTE — Telephone Encounter (Signed)
Patient is bipolar and states that her medication is not working. Malori from [facility] calling to report. Does patient need to come in or can something else be called in to treat without her coming in?  ?

## 2021-10-16 NOTE — Telephone Encounter (Signed)
Pt. Needs to be seen for this. Thanks, WS 

## 2021-10-20 ENCOUNTER — Ambulatory Visit (INDEPENDENT_AMBULATORY_CARE_PROVIDER_SITE_OTHER): Payer: Medicare HMO | Admitting: Family Medicine

## 2021-10-20 ENCOUNTER — Encounter: Payer: Self-pay | Admitting: Family Medicine

## 2021-10-20 DIAGNOSIS — E039 Hypothyroidism, unspecified: Secondary | ICD-10-CM | POA: Diagnosis not present

## 2021-10-20 DIAGNOSIS — I10 Essential (primary) hypertension: Secondary | ICD-10-CM | POA: Diagnosis not present

## 2021-10-20 DIAGNOSIS — F330 Major depressive disorder, recurrent, mild: Secondary | ICD-10-CM | POA: Diagnosis not present

## 2021-10-20 MED ORDER — OLANZAPINE 5 MG PO TABS: 5.0000 mg | ORAL_TABLET | Freq: Every day | ORAL | 2 refills | Status: DC

## 2021-10-20 NOTE — Progress Notes (Signed)
? ? ?Subjective:  ? ? Patient ID: Brittany Conrad, female    DOB: [birth year], 71 y.o.   MRN: [id number] ? ? ?HPI: ?Brittany Conrad is a 71 y.o. female presenting for bipolar meds not working. All I think about is falling, every step I take. Has become an obsession. Won't leave the house. Uses a walker. KNees buckle a lot. Can't get up when she falls. LAst was a couple of months ago. PT didn't help. Ortho said it was bursitis.  ? ?Moody, miserable. Doesn't like to answer the phone or do anything. Spends her time shopping on line and watch TV. Reads. Feeling depressed. Sad a lot. Cries easily. Wants to stay in bed all the time. Says olanzapine helped most in the past ? ? ? ?  07/27/2021  ? 11:49 AM 05/18/2021  ?  1:42 PM 03/17/2021  ?  3:02 PM 03/17/2021  ?  2:58 PM 11/20/2020  ? 12:50 PM  ?Depression screen PHQ 2/9  ?Decreased Interest 3 0 1 0 1  ?Down, Depressed, Hopeless 2 0 2 0 1  ?PHQ - 2 Score 5 0 3 0 2  ?Altered sleeping 3  1  2   ?Tired, decreased energy 2  1  2   ?Change in appetite 2  1  1   ?Feeling bad or failure about yourself  2  0  1  ?Trouble concentrating 2  1  1   ?Moving slowly or fidgety/restless 1  1  0  ?Suicidal thoughts 0  0  0  ?PHQ-9 Score 17  8  9   ?Difficult doing work/chores Not difficult at all  Not difficult at all  Somewhat difficult  ? ? ? ?Relevant past medical, surgical, family and social history reviewed and updated as indicated.  ?Interim medical history since our last visit reviewed. ?Allergies and medications reviewed and updated. ? ?ROS:  ?Review of Systems  ?Constitutional:  Positive for activity change.  ?HENT: Negative.    ?Eyes:  Negative for visual disturbance.  ?Respiratory:  Negative for shortness of breath.   ?Cardiovascular:  Negative for chest pain.  ?Gastrointestinal:  Negative for abdominal pain.  ?Musculoskeletal:  Positive for arthralgias.  ?Neurological:  Positive for weakness (both legs give way).  ?Psychiatric/Behavioral:  Positive for decreased concentration, dysphoric mood  and sleep disturbance (excessive).    ? ?Social History  ? ?Tobacco Use  ?Smoking Status Former  ? Packs/day: 2.00  ? Years: 35.00  ? Pack years: 70.00  ? Types: Cigarettes  ? Quit date: 07/05/2016  ? Years since quitting: 5.2  ?Smokeless Tobacco Never  ? ? ?   ?Objective:  ?  ? ?Wt Readings from Last 3 Encounters:  ?09/02/21 150 lb 8 oz (68.3 kg)  ?07/27/21 154 lb (69.9 kg)  ?05/18/21 159 lb 3.2 oz (72.2 kg)  ?  ? ?Exam deferred. Pt. Harboring due to COVID 19. Phone visit performed.  ? ?Assessment & Plan:  ? ?1. Essential hypertension   ?2. Hypothyroidism, unspecified type   ?3. MDD (major depressive disorder), recurrent episode, mild (HCC)   ? ? ?Meds ordered this encounter  ?Medications  ? OLANZapine (ZYPREXA) 5 MG tablet  ?  Sig: Take 1 tablet (5 mg total) by mouth at bedtime.  ?  Dispense:  30 tablet  ?  Refill:  2  ? ? ?Orders Placed This Encounter  ?Procedures  ? AMB Referral to [facility] Coordinaton  ?  Referral Priority:   Routine  ?  Referral Type:   Consultation  ?  Referral Reason:   Care Coordination  ?  Number of Visits Requested:   1  ? ?  ? ?Diagnoses and all orders for this visit: ? ?Essential hypertension ? ?Hypothyroidism, unspecified type ? ?MDD (major depressive disorder), recurrent episode, mild (HCC) ?-     AMB Referral to [facility] Coordinaton ? ?Other orders ?-     OLANZapine (ZYPREXA) 5 MG tablet; Take 1 tablet (5 mg total) by mouth at bedtime. ? ? ? ?Virtual Visit via telephone Note ? ?I discussed the limitations, risks, security and privacy concerns of performing an evaluation and management service by telephone and the availability of in person appointments. The patient was identified with two identifiers. Pt.expressed understanding and agreed to proceed. Pt. Is at home. Dr. Darlyn Read is in his office. ? ?Follow Up Instructions: ?  ?I discussed the assessment and treatment plan with the patient. The patient was provided an opportunity to ask questions and all were answered. The  patient agreed with the plan and demonstrated an understanding of the instructions. ?  ?The patient was advised to call back or seek an in-person evaluation if the symptoms worsen or if the condition fails to improve as anticipated. ? ? ?Total minutes including chart review and phone contact time: 26 ? ? ?Follow up plan: ?Return in about 1 month (around 11/19/2021). ? ?Mechele Claude, MD ?[facility] ? ?

## 2021-10-22 DIAGNOSIS — J441 Chronic obstructive pulmonary disease with (acute) exacerbation: Secondary | ICD-10-CM | POA: Diagnosis not present

## 2021-10-22 DIAGNOSIS — R0902 Hypoxemia: Secondary | ICD-10-CM | POA: Diagnosis not present

## 2021-10-22 DIAGNOSIS — J189 Pneumonia, unspecified organism: Secondary | ICD-10-CM | POA: Diagnosis not present

## 2021-10-23 ENCOUNTER — Encounter (HOSPITAL_COMMUNITY): Payer: Medicare HMO | Admitting: Hematology

## 2021-10-23 DIAGNOSIS — R6889 Other general symptoms and signs: Secondary | ICD-10-CM | POA: Diagnosis not present

## 2021-10-26 ENCOUNTER — Encounter: Payer: Self-pay | Admitting: *Deleted

## 2021-10-26 ENCOUNTER — Telehealth: Payer: Medicare HMO | Admitting: *Deleted

## 2021-10-28 ENCOUNTER — Other Ambulatory Visit: Payer: Self-pay | Admitting: Family Medicine

## 2021-10-28 MED ORDER — MECLIZINE HCL 25 MG PO TABS: ORAL_TABLET | ORAL | 5 refills | Status: DC

## 2021-10-28 MED ORDER — BUSPIRONE HCL 10 MG PO TABS: 10.0000 mg | ORAL_TABLET | Freq: Three times a day (TID) | ORAL | 0 refills | Status: DC

## 2021-10-28 NOTE — Addendum Note (Signed)
Addended by: Julious Payer D on: 10/28/2021 03:45 PM ? ? Modules accepted: Orders ? ?

## 2021-10-28 NOTE — Telephone Encounter (Signed)
?  Prescription Request ? ?10/28/2021 ? ?Is this a "Controlled Substance" medicine? no ? ?Have you seen your PCP in the last 2 weeks? Yes, telephone visit ? ?If YES, route message to pool  -  If NO, patient needs to be scheduled for appointment. ? ?What is the name of the medication or equipment? busPIRone (BUSPAR) 10 MG tablet and meclizine (ANTIVERT) 25 MG tablet ? ?Have you contacted your pharmacy to request a refill? No, wants sent to different pharmacy  ? ?Which pharmacy would you like this sent to? [facility]  ? ? ?Patient notified that their request is being sent to the clinical staff for review and that they should receive a response within 2 business days.  ?  ?

## 2021-10-28 NOTE — Telephone Encounter (Signed)
Pt aware refills sent to [facility] °

## 2021-10-30 ENCOUNTER — Telehealth: Payer: Self-pay

## 2021-10-30 NOTE — Chronic Care Management (AMB) (Signed)
?  Chronic Care Management  ? ?Note ? ?10/30/2021 ?Name: Brittany Conrad MRN: [id number] DOB: 03-06-1951 ? ?Brittany Conrad is a 71 y.o. year old female who is a primary care patient of Stacks, Broadus John, MD. [name] is currently enrolled in care management services. An additional referral for LCSW was placed.  ? ?Follow up plan: ?Telephone appointment with care management team member scheduled for:11/02/2021 ? ?Penne Lash, RMA ?Care Guide, Embedded Care Coordination ?[facility]  ?[place], [place] [address] ?Direct Dial: [phone] ?[email or web address] ?Website: [email or web address]  ? ?

## 2021-11-02 ENCOUNTER — Other Ambulatory Visit: Payer: Self-pay | Admitting: *Deleted

## 2021-11-02 ENCOUNTER — Ambulatory Visit (INDEPENDENT_AMBULATORY_CARE_PROVIDER_SITE_OTHER): Payer: Medicare HMO | Admitting: Licensed Clinical Social Worker

## 2021-11-02 ENCOUNTER — Telehealth: Payer: Self-pay | Admitting: Family Medicine

## 2021-11-02 ENCOUNTER — Other Ambulatory Visit: Payer: Self-pay | Admitting: Family Medicine

## 2021-11-02 DIAGNOSIS — I509 Heart failure, unspecified: Secondary | ICD-10-CM

## 2021-11-02 DIAGNOSIS — J441 Chronic obstructive pulmonary disease with (acute) exacerbation: Secondary | ICD-10-CM

## 2021-11-02 DIAGNOSIS — I10 Essential (primary) hypertension: Secondary | ICD-10-CM

## 2021-11-02 DIAGNOSIS — F411 Generalized anxiety disorder: Secondary | ICD-10-CM

## 2021-11-02 DIAGNOSIS — D500 Iron deficiency anemia secondary to blood loss (chronic): Secondary | ICD-10-CM

## 2021-11-02 DIAGNOSIS — F330 Major depressive disorder, recurrent, mild: Secondary | ICD-10-CM

## 2021-11-02 DIAGNOSIS — E039 Hypothyroidism, unspecified: Secondary | ICD-10-CM

## 2021-11-02 MED ORDER — ARIPIPRAZOLE 30 MG PO TABS: 30.0000 mg | ORAL_TABLET | Freq: Every day | ORAL | 2 refills | Status: DC

## 2021-11-02 MED ORDER — DULOXETINE HCL 60 MG PO CPEP: 60.0000 mg | ORAL_CAPSULE | Freq: Every day | ORAL | 5 refills | Status: DC

## 2021-11-02 NOTE — Telephone Encounter (Signed)
I sent in the requested prescriptions.  She will need to stop taking the olanzapine as a result of these changes. ?

## 2021-11-02 NOTE — Patient Instructions (Addendum)
Visit Information ? ?Patient goals:  Anxiety Symptoms Monitored and Managed ? ?Time Frame:  Short Term Goal ?Priority:  High ?Progress:  Not On Track ? ?Start Date:  11/02/21 ? ?End Date:  02/02/22 ? ?Follow Up Date: 12/16/21 at 1:00 PM ? ?Anxiety Symptoms Monitored and Managed ? ?Patient Coping Strengths:  ?Attends scheduled medical appointments ?Takes medications as prescribed ? ?Patient Self Care Deficits:  ?Anxiety issues. Stress issues ?Transport challenges ?No family support ? ?Patient Goals:  ? ?- practice relaxation or meditation daily ?- keep a calendar with appointment dates ?Attend scheduled medical appointments in next 30 days ? ?Follow Up Plan: LCSW to call client on 12/16/21 at 1:00 PM  ? ?Brittany Conrad.Wynelle Dreier MSW, LCSW ?Licensed Clinical Social Worker ?[facility] Care Management ?[phone] ?

## 2021-11-02 NOTE — Chronic Care Management (AMB) (Signed)
?Chronic Care Management  ? ? Clinical Social Work Note ? ?11/02/2021 ?Name: Brittany Conrad MRN: [id number] DOB: [birth year] ? ?[name] is a 71 y.o. year old female who is a primary care patient of Stacks, Cletus Gash, MD. The CCM team was consulted to assist the patient with chronic disease management and/or care coordination needs related to: [employer] .  ? ?Engaged with patient by telephone for initial visit in response to provider referral for social work chronic care management and care coordination services.  ? ?Consent to Services:  ?The patient was given the following information about Chronic Care Management services today, agreed to services, and gave verbal consent: 1. CCM service includes personalized support from designated clinical staff supervised by the primary care provider, including individualized plan of care and coordination with other care providers 2. 24/7 contact phone numbers for assistance for urgent and routine care needs. 3. Service will only be billed when office clinical staff spend 20 minutes or more in a month to coordinate care. 4. Only one practitioner may furnish and bill the service in a calendar month. 5.The patient may stop CCM services at any time (effective at the end of the month) by phone call to the office staff. 6. The patient will be responsible for cost sharing (co-pay) of up to 20% of the service fee (after annual deductible is met). Patient agreed to services and consent obtained. ? ?Patient agreed to services and consent obtained.  ? ?Assessment: Review of patient past medical history, allergies, medications, and health status, including review of relevant consultants reports was performed today as part of a comprehensive evaluation and provision of chronic care management and care coordination services.    ? ?SDOH (Social Determinants of Health) assessments and interventions performed:  ?SDOH Interventions   ? ?Flowsheet Row Most Recent Value  ?SDOH  Interventions   ?Physical Activity Interventions Other (Comments)  [walking challenges. client uses a walker to help her walk]  ?Stress Interventions Provide Counseling  [client has stress related to obtaining medication to help treat BiPolar symtpoms of client]  ?Depression Interventions/Treatment  Counseling  ? ?  ?  ? ?Advanced Directives Status: See Vynca application for related entries. ? ?CCM Care Plan ? ?Allergies  ?Allergen Reactions  ? Tramadol   ?  Can't take d/t pain clinic  ? ? ?Outpatient Encounter Medications as of 11/02/2021  ?Medication Sig Note  ? albuterol (PROVENTIL) (2.5 MG/3ML) 0.083% nebulizer solution INHALE 3 ML BY NEBULIZATION EVERY 6 HOURS AS NEEDED FOR WHEEZING OR SHORTNESS OF BREATH   ? albuterol (VENTOLIN HFA) 108 (90 Base) MCG/ACT inhaler INHALE 1-2 PUFFS BY MOUTH EVERY 6 HOURS AS NEEDED FOR WHEEZE OR SHORTNESS OF BREATH   ? amitriptyline (ELAVIL) 100 MG tablet TAKE 2 TABLETS BY MOUTH AT BEDTIME.   ? amLODipine (NORVASC) 10 MG tablet Take 1 tablet (10 mg total) by mouth daily.   ? Budeson-Glycopyrrol-Formoterol (BREZTRI AEROSPHERE) 160-9-4.8 MCG/ACT AERO Inhale 2 puffs into the lungs 2 (two) times daily.   ? busPIRone (BUSPAR) 10 MG tablet Take 1 tablet (10 mg total) by mouth 3 (three) times daily.   ? [employer] D3 25 MCG (1000 UT) capsule TAKE 1 CAPSULE (1,000 UNITS TOTAL) BY MOUTH DAILY.   ? diclofenac Sodium (VOLTAREN) 1 % GEL    ? fluticasone (FLONASE) 50 MCG/ACT nasal spray 1 spray into each nostril in the morning. (Patient not taking: Reported on 09/02/2021)   ? furosemide (LASIX) 40 MG tablet Take 1 tablet (40 mg total) by mouth 2 (  two) times daily. Patient must be seen for any further refills.   ? gabapentin (NEURONTIN) 400 MG capsule Take 400 mg by mouth 3 (three) times daily.   ? HYDROcodone-acetaminophen (NORCO) 7.5-325 MG tablet Take 1 tablet by mouth every 6 (six) hours as needed for moderate pain. 11/20/2020: Taking QID  ? ibuprofen (ADVIL,MOTRIN) 800 MG tablet Take 800 mg by mouth 3  (three) times daily as needed.    ? ipratropium (ATROVENT) 0.02 % nebulizer solution Take 2.5 mLs (0.5 mg total) by nebulization 4 (four) times daily. (Patient not taking: Reported on 09/02/2021)   ? KLOR-CON M20 20 MEQ tablet TAKE 2 TABLETS (40 MEQ TOTAL) BY MOUTH 3 (THREE) TIMES DAILY. FOR POTASSIUM REPLACEMENT/ SUPPLEMENT   ? levothyroxine (SYNTHROID) 100 MCG tablet Take 1 tablet (100 mcg total) by mouth daily.   ? meclizine (ANTIVERT) 25 MG tablet TAKE 1 TABLET BY MOUTH EVERY 8 HOURS AS NEEDED FOR VERTIGO   ? OLANZapine (ZYPREXA) 5 MG tablet Take 1 tablet (5 mg total) by mouth at bedtime.   ? pantoprazole (PROTONIX) 40 MG tablet TAKE 1 TABLET BY MOUTH TWICE A DAY   ? sucralfate (CARAFATE) 1 g tablet    ? ?No facility-administered encounter medications on file as of 11/02/2021.  ? ? ?Patient Active Problem List  ? Diagnosis Date Noted  ? Iron deficiency anemia due to chronic blood loss 09/02/2021  ? Hypokalemia 09/02/2021  ? Renal insufficiency 08/21/2021  ? Hypothyroidism 05/18/2021  ? Arthralgia of both knees 02/09/2021  ? Frequent falls 02/09/2021  ? Urinary tract infection without hematuria 02/09/2021  ? Cough 12/25/2020  ? Leukocytosis 11/29/2020  ? Pneumonia 11/29/2020  ? Sepsis ([place]) 11/29/2020  ? Chronic congestive heart failure, unspecified heart failure type ([place]) 10/23/2020  ? Essential hypertension 10/23/2020  ? Benzodiazepine dependence, continuous ([place]) 10/23/2020  ? Chronic pain syndrome 10/23/2020  ? Acute respiratory failure ([place]) 09/07/2020  ? MDD (major depressive disorder), recurrent episode, mild ([place]) 09/07/2019  ? GAD (generalized anxiety disorder) 03/01/2019  ? Normocytic anemia 04/13/2018  ? COPD (chronic obstructive pulmonary disease) ([place]) 04/13/2018  ? ? ?Conditions to be addressed/monitored: monitor client management of anxiety issues ? ?Care Plan : LCSW Care Plan  ?Updates made by Katha Cabal, LCSW since 11/02/2021 12:00 AM  ?  ? ?Problem: Emotional Distress   ?  ? ?Goal: Emotional  Health Supported. Manage Anxiety issues; Manage Depression issues   ?Start Date: 11/02/2021  ?Expected End Date: 02/02/2022  ?This Visit's Progress: Not on track  ?Priority: High  ?Note:   ?Current Barriers:  ?Chronic Mental Health needs related to anxiety and depression ?Transportation ?Decreased family support ?Suicidal Ideation/Homicidal Ideation: No ? ?Clinical Social Work Goal(s):  ?patient will work with SW monthly by telephone or in person to reduce or manage symptoms related to anxiety and depression ?Client will attend scheduled medical appointments in next 30 days ?Client will communicate as needed in next 30 days with RNCM ? ?Interventions: ?Patient interviewed and appropriate assessments performed: GAD-7; PHQ: 2/9 ?1:1 collaboration with [name], MD regarding development and update of comprehensive plan of care as evidenced by provider attestation and co-signature ?LCSW discussed client needs with [name] ?Client spoke quickly with pressured speech. She said she wanted to get a medication to help treat her Bipolar symptoms (client words).  LCSW informed client that LCSW was given referral by [facility] to talk with Mame about her SW needs.  ?Reviewed family support. Client said she had no family support. She did not  speak of any friendships with neighbors or relatives.   ?Reviewed medication procurement. Reviewed sleeping challenges of client ?Encouraged client to call RNCM as needed for nursing support ?Encouraged client to call [facility] Triage Nurse to discuss acute medical needs or acute nursing needs ?Reviewed her history of seeing psychiatrist in [place], [place]. Client said she had seen psychiatrist in [place], [place] and psychiatrist had recommended client involve in counseling support sessions.  Client said she had not gone back to that psychiatrist. ?Reviewed ambulation of client. She uses a walker to help her walk. ?Reviewed meal procurement. She cooks meals for herself as needed. ?Reviewed  client participation in [facility]. She said she goes to scheduled appointments at [facility] ?Reviewed transport needs. She said she uses [facility] occasionally to help transport her to and from medical appointments

## 2021-11-02 NOTE — Telephone Encounter (Signed)
PATIENT AWARE

## 2021-11-05 DIAGNOSIS — M25562 Pain in left knee: Secondary | ICD-10-CM | POA: Diagnosis not present

## 2021-11-05 DIAGNOSIS — Z79899 Other long term (current) drug therapy: Secondary | ICD-10-CM | POA: Diagnosis not present

## 2021-11-05 DIAGNOSIS — M25561 Pain in right knee: Secondary | ICD-10-CM | POA: Diagnosis not present

## 2021-11-05 DIAGNOSIS — F319 Bipolar disorder, unspecified: Secondary | ICD-10-CM | POA: Diagnosis not present

## 2021-11-05 DIAGNOSIS — G609 Hereditary and idiopathic neuropathy, unspecified: Secondary | ICD-10-CM | POA: Diagnosis not present

## 2021-11-05 DIAGNOSIS — R6889 Other general symptoms and signs: Secondary | ICD-10-CM | POA: Diagnosis not present

## 2021-11-11 DIAGNOSIS — R6889 Other general symptoms and signs: Secondary | ICD-10-CM | POA: Diagnosis not present

## 2021-11-19 ENCOUNTER — Ambulatory Visit: Payer: Medicare HMO | Admitting: Family Medicine

## 2021-11-20 DIAGNOSIS — W19XXXA Unspecified fall, initial encounter: Secondary | ICD-10-CM | POA: Diagnosis not present

## 2021-11-20 DIAGNOSIS — R000 Tachycardia, unspecified: Secondary | ICD-10-CM | POA: Diagnosis not present

## 2021-11-20 DIAGNOSIS — R0902 Hypoxemia: Secondary | ICD-10-CM | POA: Diagnosis not present

## 2021-11-21 DIAGNOSIS — R0902 Hypoxemia: Secondary | ICD-10-CM | POA: Diagnosis not present

## 2021-11-21 DIAGNOSIS — J441 Chronic obstructive pulmonary disease with (acute) exacerbation: Secondary | ICD-10-CM | POA: Diagnosis not present

## 2021-11-21 DIAGNOSIS — J189 Pneumonia, unspecified organism: Secondary | ICD-10-CM | POA: Diagnosis not present

## 2021-11-23 ENCOUNTER — Ambulatory Visit (INDEPENDENT_AMBULATORY_CARE_PROVIDER_SITE_OTHER): Payer: Medicare HMO

## 2021-11-23 VITALS — Wt 150.0 lb

## 2021-11-23 DIAGNOSIS — Z780 Asymptomatic menopausal state: Secondary | ICD-10-CM

## 2021-11-23 DIAGNOSIS — Z0000 Encounter for general adult medical examination without abnormal findings: Secondary | ICD-10-CM | POA: Diagnosis not present

## 2021-11-23 DIAGNOSIS — Z1211 Encounter for screening for malignant neoplasm of colon: Secondary | ICD-10-CM | POA: Diagnosis not present

## 2021-11-23 NOTE — Progress Notes (Signed)
Subjective:   Brittany Conrad is a 71 y.o. female who presents for Medicare Annual (Subsequent) preventive examination.  Virtual Visit via Telephone Note  I connected with  Brittany Conrad on 11/23/21 at 12:00 PM EDT by telephone and verified that I am speaking with the correct person using two identifiers.  Location: Patient: Home Provider: [facility] Persons participating in the virtual visit: patient/Nurse Health Advisor   I discussed the limitations, risks, security and privacy concerns of performing an evaluation and management service by telephone and the availability of in person appointments. The patient expressed understanding and agreed to proceed.  Interactive audio and video telecommunications were attempted between this nurse and patient, however failed, due to patient having technical difficulties OR patient did not have access to video capability.  We continued and completed visit with audio only.  Some vital signs may be absent or patient reported.   Sabino Denning E Jeremaih Klima, LPN   Review of Systems     Cardiac Risk Factors include: advanced age (>39men, >37 women);sedentary lifestyle;dyslipidemia;hypertension;Other (see comment), Risk factor comments: CHF, COPD, renal insufficiency, anemia     Objective:    Today's Vitals   11/23/21 1201  Weight: 150 lb (68 kg)  PainSc: 7    Body mass index is 27.44 kg/m.     11/23/2021   12:13 PM 11/20/2020   12:52 PM 09/04/2019    8:42 AM  Advanced Directives  Does Patient Have a Medical Advance Directive? No No No  Would patient like information on creating a medical advance directive? No - Patient declined Yes (MAU/Ambulatory/Procedural Areas - Information given) No - Patient declined    Current Medications (verified) Outpatient Encounter Medications as of 11/23/2021  Medication Sig   albuterol (PROVENTIL) (2.5 MG/3ML) 0.083% nebulizer solution INHALE 3 ML BY NEBULIZATION EVERY 6 HOURS AS NEEDED FOR WHEEZING OR SHORTNESS OF BREATH    albuterol (VENTOLIN HFA) 108 (90 Base) MCG/ACT inhaler INHALE 1-2 PUFFS BY MOUTH EVERY 6 HOURS AS NEEDED FOR WHEEZE OR SHORTNESS OF BREATH   amitriptyline (ELAVIL) 100 MG tablet TAKE 2 TABLETS BY MOUTH AT BEDTIME.   amLODipine (NORVASC) 10 MG tablet Take 1 tablet (10 mg total) by mouth daily.   ARIPiprazole (ABILIFY) 30 MG tablet Take 1 tablet (30 mg total) by mouth daily.   Budeson-Glycopyrrol-Formoterol (BREZTRI AEROSPHERE) 160-9-4.8 MCG/ACT AERO Inhale 2 puffs into the lungs 2 (two) times daily.   busPIRone (BUSPAR) 10 MG tablet Take 1 tablet (10 mg total) by mouth 3 (three) times daily.   clonazePAM (KLONOPIN) 0.5 MG tablet Take 0.5 mg by mouth daily as needed.   [employer] D3 25 MCG (1000 UT) capsule TAKE 1 CAPSULE (1,000 UNITS TOTAL) BY MOUTH DAILY.   DULoxetine (CYMBALTA) 60 MG capsule Take 1 capsule (60 mg total) by mouth daily.   ferrous sulfate 325 (65 FE) MG tablet Take 325 mg by mouth every morning.   furosemide (LASIX) 40 MG tablet Take 1 tablet (40 mg total) by mouth 2 (two) times daily. Patient must be seen for any further refills.   gabapentin (NEURONTIN) 400 MG capsule Take 400 mg by mouth 3 (three) times daily.   HYDROcodone-acetaminophen (NORCO) 7.5-325 MG tablet Take 1 tablet by mouth every 6 (six) hours as needed for moderate pain.   ipratropium (ATROVENT) 0.02 % nebulizer solution Take 2.5 mLs (0.5 mg total) by nebulization 4 (four) times daily.   KLOR-CON M20 20 MEQ tablet TAKE 2 TABLETS (40 MEQ TOTAL) BY MOUTH 3 (THREE) TIMES DAILY. FOR POTASSIUM REPLACEMENT/ SUPPLEMENT  levothyroxine (SYNTHROID) 100 MCG tablet Take 1 tablet (100 mcg total) by mouth daily.   meclizine (ANTIVERT) 25 MG tablet TAKE 1 TABLET BY MOUTH EVERY 8 HOURS AS NEEDED FOR VERTIGO   OLANZapine (ZYPREXA) 5 MG tablet Take 5 mg by mouth at bedtime.   pantoprazole (PROTONIX) 40 MG tablet TAKE 1 TABLET BY MOUTH TWICE A DAY   sucralfate (CARAFATE) 1 g tablet    ibuprofen (ADVIL,MOTRIN) 800 MG tablet Take 800 mg by  mouth 3 (three) times daily as needed.  (Patient not taking: Reported on 11/23/2021)   [DISCONTINUED] diclofenac Sodium (VOLTAREN) 1 % GEL  (Patient not taking: Reported on 11/23/2021)   [DISCONTINUED] fluticasone (FLONASE) 50 MCG/ACT nasal spray 1 spray into each nostril in the morning. (Patient not taking: Reported on 11/23/2021)   No facility-administered encounter medications on file as of 11/23/2021.    Allergies (verified) Tramadol   History: Past Medical History:  Diagnosis Date   Asthma    Bipolar 1 disorder (HCC)    COPD (chronic obstructive pulmonary disease) (HCC)    Emphysema lung (HCC)    Emphysema of lung (HCC)    Neuropathy    Scoliosis    Past Surgical History:  Procedure Laterality Date   ABDOMINAL HYSTERECTOMY     BACK SURGERY     BREAST REDUCTION SURGERY     GALLBLADDER SURGERY     GASTRIC BYPASS     tummy tuck     Family History  Problem Relation Age of Onset   Cancer Mother        bone   Cancer Father        liver, lungs   Bipolar disorder Daughter    Alzheimer's disease Maternal Grandmother    Tuberculosis Maternal Grandfather    Cancer Paternal Grandmother    Stroke Paternal Grandfather    Social History   Socioeconomic History   Marital status: Widowed    Spouse name: Not on file   Number of children: 2   Years of education: 87   Highest education level: 11th grade  Occupational History   Occupation: Disabled  Tobacco Use   Smoking status: Former    Packs/day: 2.00    Years: 35.00    Pack years: 70.00    Types: Cigarettes    Quit date: 07/05/2016    Years since quitting: 5.3   Smokeless tobacco: Never  Vaping Use   Vaping Use: Never used  Substance and Sexual Activity   Alcohol use: Yes    Comment: occasional   Drug use: Never   Sexual activity: Not Currently  Other Topics Concern   Not on file  Social History Narrative   Lives alone; sister lives nearby; children live out of town;    Her insurance pays for her public  transportation - she doesn't drive   Social Determinants of Health   Financial Resource Strain: Low Risk    Difficulty of Paying Living Expenses: Not very hard  Food Insecurity: No Food Insecurity   Worried About Charity fundraiser in the Last Year: Never true   Ran Out of Food in the Last Year: Never true  Transportation Needs: No Transportation Needs   Lack of Transportation (Medical): No   Lack of Transportation (Non-Medical): No  Physical Activity: Insufficiently Active   Days of Exercise per Week: 7 days   Minutes of Exercise per Session: 10 min  Stress: Stress Concern Present   Feeling of Stress : To some extent  Social Connections: Socially  Isolated   Frequency of Communication with Friends and Family: More than three times a week   Frequency of Social Gatherings with Friends and Family: More than three times a week   Attends Religious Services: Never   Marine scientist or Organizations: No   Attends Music therapist: Never   Marital Status: Never married    Tobacco Counseling Counseling given: Not Answered   Clinical Intake:  Pre-visit preparation completed: Yes  Pain : 0-10 Pain Score: 7  Pain Type: Chronic pain Pain Location: Back Pain Descriptors / Indicators: Aching, Discomfort Pain Onset: More than a month ago Pain Frequency: Intermittent     BMI - recorded: 27.44 Nutritional Status: BMI 25 -29 Overweight Nutritional Risks: None Diabetes: No  How often do you need to have someone help you when you read instructions, pamphlets, or other written materials from your doctor or pharmacy?: 1 - Never  Diabetic? no  Interpreter Needed?: No  Information entered by :: Avrom Robarts, LPN   Activities of Daily Living    11/23/2021   12:14 PM  In your present state of health, do you have any difficulty performing the following activities:  Hearing? 0  Vision? 0  Difficulty concentrating or making decisions? 0  Walking or climbing  stairs? 1  Dressing or bathing? 0  Doing errands, shopping? 1  Comment doesn't drive - poor vision - uses Development worker, community and eating ? N  Using the Toilet? N  In the past six months, have you accidently leaked urine? N  Do you have problems with loss of bowel control? N  Managing your Medications? N  Managing your Finances? N  Housekeeping or managing your Housekeeping? N    Patient Care Team: [name], MD as PCP - General (Family Medicine) Almyra Free, MD as Referring Physician (Physical Medicine and Rehabilitation) Ilean China, RN as Case Manager Shea Evans Norva Riffle, LCSW as [place] (Licensed Clinical Social Worker)  Indicate any recent [place] you may have received from other than [facility] providers in the past year (date may be approximate).     Assessment:   This is a routine wellness examination for Brittany Conrad.  Hearing/Vision screen Hearing Screening - Comments:: Denies hearing difficulties   Vision Screening - Comments:: Wears rx glasses; blind in right eye; behind on eye exams - no optometrist at this time.  Dietary issues and exercise activities discussed: Current Exercise Habits: Home exercise routine, Type of exercise: walking, Time (Minutes): 15, Frequency (Times/Week): 7, Weekly Exercise (Minutes/Week): 105, Intensity: Mild, Exercise limited by: respiratory conditions(s);orthopedic condition(s)   Goals Addressed             This Visit's Progress    Patient Stated   Not on track    Lose a little weight and get more active     patient stated   Not on track    11/23/2021  AWV Goal: Fall Prevention  Over the next year, patient will decrease their risk for falls by: Using assistive devices, such as a cane or walker, as needed Identifying fall risks within their home and correcting them by: Removing throw rugs Adding handrails to stairs or ramps Removing clutter and keeping a clear pathway  throughout the home Increasing light, especially at night Adding shower handles/bars Raising toilet seat Identifying potential personal risk factors for falls: Medication side effects Incontinence/urgency Vestibular dysfunction Hearing loss Musculoskeletal disorders Neurological disorders Orthostatic hypotension  Depression Screen    11/23/2021   12:12 PM 11/02/2021   10:40 AM 07/27/2021   11:49 AM 05/18/2021    1:42 PM 03/17/2021    3:02 PM 03/17/2021    2:58 PM 11/20/2020   12:50 PM  PHQ 2/9 Scores  PHQ - 2 Score 2 5 5  0 3 0 2  PHQ- 9 Score 6 16 17  8  9     Fall Risk    11/23/2021   12:04 PM 05/18/2021    1:42 PM 03/17/2021    3:02 PM 03/17/2021    2:58 PM 11/20/2020   12:52 PM  Fall Risk   Falls in the past year? 1 0 1 0 1  Number falls in past yr: 1  1  1   Injury with Fall? 1  1  0  Risk for fall due to : Orthopedic patient;History of fall(s);Impaired balance/gait  History of fall(s);Impaired balance/gait  Impaired balance/gait;Orthopedic patient;History of fall(s);Impaired vision;Medication side effect  Follow up Education provided;Falls prevention discussed  Falls evaluation completed  Education provided;Falls prevention discussed    FALL RISK PREVENTION PERTAINING TO THE HOME:  Any stairs in or around the home? Yes  If so, are there any without handrails? No  Home free of loose throw rugs in walkways, pet beds, electrical cords, etc? Yes  Adequate lighting in your home to reduce risk of falls? Yes   ASSISTIVE DEVICES UTILIZED TO PREVENT FALLS:  Life alert? No  Use of a cane, walker or w/c? Yes  Grab bars in the bathroom? No  Shower chair or bench in shower? No  Elevated toilet seat or a handicapped toilet? Yes   TIMED UP AND GO:  Was the test performed? No . Telephonic visit  Cognitive Function:        11/23/2021   12:14 PM 11/20/2020   12:32 PM 09/04/2019    8:45 AM  6CIT Screen  What Year? 0 points 0 points 0 points  What month? 0 points 0  points 0 points  What time? 0 points 0 points 0 points  Count back from 20 0 points 0 points 2 points  Months in reverse 0 points 0 points 0 points  Repeat phrase 6 points 4 points 2 points  Total Score 6 points 4 points 4 points    Immunizations Immunization History  Administered Date(s) Administered   Fluad Quad(high Dose 65+) 04/08/2020, 05/18/2021   Influenza, High Dose Seasonal PF 03/31/2018   Janssen (J&J) SARS-COV-2 Vaccination 12/22/2019   PNEUMOCOCCAL CONJUGATE-20 09/10/2021   Pneumococcal Conjugate-13 10/23/2020   Tdap 01/11/2019    TDAP status: Up to date  Flu Vaccine status: Up to date  Pneumococcal vaccine status: Up to date  Covid-19 vaccine status: Completed vaccines  Qualifies for Shingles Vaccine? Yes   Zostavax completed No   Shingrix Completed?: No.    Education has been provided regarding the importance of this vaccine. Patient has been advised to call insurance company to determine out of pocket expense if they have not yet received this vaccine. Advised may also receive vaccine at local pharmacy or Health Dept. Verbalized acceptance and understanding.  Screening Tests Health Maintenance  Topic Date Due   Zoster Vaccines- Shingrix (1 of 2) Never done   COLONOSCOPY (Pts 45-3yrs Insurance coverage will need to be confirmed)  Never done   MAMMOGRAM  Never done   DEXA SCAN  Never done   COVID-19 Vaccine (2 - Janssen risk series) 01/19/2020   Hepatitis C Screening  05/18/2022 (Originally  01/14/1969)   INFLUENZA VACCINE  02/02/2022   TETANUS/TDAP  01/10/2029   Pneumonia Vaccine 36+ Years old  Completed   HPV VACCINES  Aged Out    Health Maintenance  Health Maintenance Due  Topic Date Due   Zoster Vaccines- Shingrix (1 of 2) Never done   COLONOSCOPY (Pts 45-44yrs Insurance coverage will need to be confirmed)  Never done   MAMMOGRAM  Never done   DEXA SCAN  Never done   COVID-19 Vaccine (2 - Janssen risk series) 01/19/2020    Ordered COLOGUARD  today  Mammogram status: Ordered 11/23/2021. Pt provided with contact info and advised to call to schedule appt.  Made appt  Bone Density status: Ordered 11/23/2021. Pt provided with contact info and advised to call to schedule appt.  Lung Cancer Screening: (Low Dose CT Chest recommended if Age 69-80 years, 30 pack-year currently smoking OR have quit w/in 15years.) does qualify.   Lung Cancer Screening Referral: had this last year 11/2020 - due again - will discuss with PCP  Additional Screening:  Hepatitis C Screening: does qualify; DUE  Vision Screening: Recommended annual ophthalmology exams for early detection of glaucoma and other disorders of the eye. Is the patient up to date with their annual eye exam?  No  Who is the provider or what is the name of the office in which the patient attends annual eye exams? [facility] last 2021 - says she has no eye doctor now If pt is not established with a provider, would they like to be referred to a provider to establish care? No .   Dental Screening: Recommended annual dental exams for proper oral hygiene  Community Resource Referral / Chronic Care Management: CRR required this visit?  No   CCM required this visit?  No      Plan:     I have personally reviewed and noted the following in the patient's chart:   Medical and social history Use of alcohol, tobacco or illicit drugs  Current medications and supplements including opioid prescriptions.  Functional ability and status Nutritional status Physical activity Advanced directives List of other physicians Hospitalizations, surgeries, and ER visits in previous 12 months Vitals Screenings to include cognitive, depression, and falls Referrals and appointments  In addition, I have reviewed and discussed with patient certain preventive protocols, quality metrics, and best practice recommendations. A written personalized care plan for preventive services as well as general  preventive health recommendations were provided to patient.     Sandrea Hammond, LPN   [id number]   Nurse Notes: She is due for Low dose lung cancer screening - had this last year - I didn't re-order today as she said she will discuss with PCP because she may need pulmonology referral - is trying to get oxygen.  I ordered DEXA, Cologuard, scheduled Mammogram in mobile unit. She still needs Shingrix and Hep C screening to be UTD.

## 2021-11-23 NOTE — Patient Instructions (Signed)
Brittany Conrad , Thank you for taking time to come for your Medicare Wellness Visit. I appreciate your ongoing commitment to your health goals. Please review the following plan we discussed and let me know if I can assist you in the future.   Screening recommendations/referrals: Colonoscopy: Due - I ordered an at home test called Cologuard - this will come in the mail soon Mammogram: I made you an appointment for 5/30 @ 1:20pm at [facility] in the mobile mammogram bus Bone Density: Due - consider getting this at your next appointment Recommended yearly ophthalmology/optometry visit for glaucoma screening and checkup Recommended yearly dental visit for hygiene and checkup  Vaccinations: Influenza vaccine: Done 05/18/2021 - Repeat annually Pneumococcal vaccine: Done 10/23/2020 & 09/10/2021 Tdap vaccine: Done 01/11/2019 - Repeat in 10 years Shingles vaccine: Due - Shingrix is 2 doses 2-6 months apart and over 90% effective  *consider getting at next visit   Covid-19: Done 12/22/2019 - declines boosters for now  Advanced directives: Advance directive discussed with you today. Even though you declined this today, please call our office should you change your mind, and we can give you the proper paperwork for you to fill out.   Conditions/risks identified: Aim for 30 minutes of exercise or brisk walking, 6-8 glasses of water, and 5 servings of fruits and vegetables each day.   Next appointment: Follow up in one year for your annual wellness visit    Preventive Care 65 Years and Older, Female Preventive care refers to lifestyle choices and visits with your health care provider that can promote health and wellness. What does preventive care include? A yearly physical exam. This is also called an annual well check. Dental exams once or twice a year. Routine eye exams. Ask your health care provider how often you should have your eyes checked. Personal lifestyle choices, including: Daily care of your teeth and  gums. Regular physical activity. Eating a healthy diet. Avoiding tobacco and drug use. Limiting alcohol use. Practicing safe sex. Taking low-dose aspirin every day. Taking vitamin and mineral supplements as recommended by your health care provider. What happens during an annual well check? The services and screenings done by your health care provider during your annual well check will depend on your age, overall health, lifestyle risk factors, and family history of disease. Counseling  Your health care provider may ask you questions about your: Alcohol use. Tobacco use. Drug use. Emotional well-being. Home and relationship well-being. Sexual activity. Eating habits. History of falls. Memory and ability to understand (cognition). Work and work Astronomer. Reproductive health. Screening  You may have the following tests or measurements: Height, weight, and BMI. Blood pressure. Lipid and cholesterol levels. These may be checked every 5 years, or more frequently if you are over 55 years old. Skin check. Lung cancer screening. You may have this screening every year starting at age 1 if you have a 30-pack-year history of smoking and currently smoke or have quit within the past 15 years. Fecal occult blood test (FOBT) of the stool. You may have this test every year starting at age 72. Flexible sigmoidoscopy or colonoscopy. You may have a sigmoidoscopy every 5 years or a colonoscopy every 10 years starting at age 33. Hepatitis C blood test. Hepatitis B blood test. Sexually transmitted disease (STD) testing. Diabetes screening. This is done by checking your blood sugar (glucose) after you have not eaten for a while (fasting). You may have this done every 1-3 years. Bone density scan. This is done to  screen for osteoporosis. You may have this done starting at age 70. Mammogram. This may be done every 1-2 years. Talk to your health care provider about how often you should have regular  mammograms. Talk with your health care provider about your test results, treatment options, and if necessary, the need for more tests. Vaccines  Your health care provider may recommend certain vaccines, such as: Influenza vaccine. This is recommended every year. Tetanus, diphtheria, and acellular pertussis (Tdap, Td) vaccine. You may need a Td booster every 10 years. Zoster vaccine. You may need this after age 70. Pneumococcal 13-valent conjugate (PCV13) vaccine. One dose is recommended after age 70. Pneumococcal polysaccharide (PPSV23) vaccine. One dose is recommended after age 70. Talk to your health care provider about which screenings and vaccines you need and how often you need them. This information is not intended to replace advice given to you by your health care provider. Make sure you discuss any questions you have with your health care provider. Document Released: 07/18/2015 Document Revised: 03/10/2016 Document Reviewed: 04/22/2015 Elsevier Interactive Patient Education  2017 [employer]  Fall Prevention in the Home Falls can cause injuries. They can happen to people of all ages. There are many things you can do to make your home safe and to help prevent falls. What can I do on the outside of my home? Regularly fix the edges of walkways and driveways and fix any cracks. Remove anything that might make you trip as you walk through a door, such as a raised step or threshold. Trim any bushes or trees on the path to your home. Use bright outdoor lighting. Clear any walking paths of anything that might make someone trip, such as rocks or tools. Regularly check to see if handrails are loose or broken. Make sure that both sides of any steps have handrails. Any raised decks and porches should have guardrails on the edges. Have any leaves, snow, or ice cleared regularly. Use sand or salt on walking paths during winter. Clean up any spills in your garage right away. This includes oil  or grease spills. What can I do in the bathroom? Use night lights. Install grab bars by the toilet and in the tub and shower. Do not use towel bars as grab bars. Use non-skid mats or decals in the tub or shower. If you need to sit down in the shower, use a plastic, non-slip stool. Keep the floor dry. Clean up any water that spills on the floor as soon as it happens. Remove soap buildup in the tub or shower regularly. Attach bath mats securely with double-sided non-slip rug tape. Do not have throw rugs and other things on the floor that can make you trip. What can I do in the bedroom? Use night lights. Make sure that you have a light by your bed that is easy to reach. Do not use any sheets or blankets that are too big for your bed. They should not hang down onto the floor. Have a firm chair that has side arms. You can use this for support while you get dressed. Do not have throw rugs and other things on the floor that can make you trip. What can I do in the kitchen? Clean up any spills right away. Avoid walking on wet floors. Keep items that you use a lot in easy-to-reach places. If you need to reach something above you, use a strong step stool that has a grab bar. Keep electrical cords out of the way.  Do not use floor polish or wax that makes floors slippery. If you must use wax, use non-skid floor wax. Do not have throw rugs and other things on the floor that can make you trip. What can I do with my stairs? Do not leave any items on the stairs. Make sure that there are handrails on both sides of the stairs and use them. Fix handrails that are broken or loose. Make sure that handrails are as long as the stairways. Check any carpeting to make sure that it is firmly attached to the stairs. Fix any carpet that is loose or worn. Avoid having throw rugs at the top or bottom of the stairs. If you do have throw rugs, attach them to the floor with carpet tape. Make sure that you have a light  switch at the top of the stairs and the bottom of the stairs. If you do not have them, ask someone to add them for you. What else can I do to help prevent falls? Wear shoes that: Do not have high heels. Have rubber bottoms. Are comfortable and fit you well. Are closed at the toe. Do not wear sandals. If you use a stepladder: Make sure that it is fully opened. Do not climb a closed stepladder. Make sure that both sides of the stepladder are locked into place. Ask someone to hold it for you, if possible. Clearly mark and make sure that you can see: Any grab bars or handrails. First and last steps. Where the edge of each step is. Use tools that help you move around (mobility aids) if they are needed. These include: Canes. Walkers. Scooters. Crutches. Turn on the lights when you go into a dark area. Replace any light bulbs as soon as they burn out. Set up your furniture so you have a clear path. Avoid moving your furniture around. If any of your floors are uneven, fix them. If there are any pets around you, be aware of where they are. Review your medicines with your doctor. Some medicines can make you feel dizzy. This can increase your chance of falling. Ask your doctor what other things that you can do to help prevent falls. This information is not intended to replace advice given to you by your health care provider. Make sure you discuss any questions you have with your health care provider. Document Released: 04/17/2009 Document Revised: 11/27/2015 Document Reviewed: 07/26/2014 Elsevier Interactive Patient Education  2017 [employer].

## 2021-11-24 ENCOUNTER — Ambulatory Visit: Payer: Medicare HMO | Admitting: Family Medicine

## 2021-11-25 ENCOUNTER — Other Ambulatory Visit: Payer: Self-pay | Admitting: Family Medicine

## 2021-11-25 DIAGNOSIS — Z1231 Encounter for screening mammogram for malignant neoplasm of breast: Secondary | ICD-10-CM

## 2021-11-27 ENCOUNTER — Ambulatory Visit (INDEPENDENT_AMBULATORY_CARE_PROVIDER_SITE_OTHER): Payer: Medicare HMO | Admitting: Family Medicine

## 2021-11-27 ENCOUNTER — Ambulatory Visit: Payer: Medicare HMO | Admitting: *Deleted

## 2021-11-27 ENCOUNTER — Encounter: Payer: Self-pay | Admitting: Family Medicine

## 2021-11-27 DIAGNOSIS — I509 Heart failure, unspecified: Secondary | ICD-10-CM

## 2021-11-27 DIAGNOSIS — Z748 Other problems related to care provider dependency: Secondary | ICD-10-CM

## 2021-11-27 DIAGNOSIS — J449 Chronic obstructive pulmonary disease, unspecified: Secondary | ICD-10-CM | POA: Diagnosis not present

## 2021-11-27 NOTE — Progress Notes (Signed)
   Virtual Visit  Note Due to COVID-19 pandemic this visit was conducted virtually. This visit type was conducted due to national recommendations for restrictions regarding the COVID-19 Pandemic (e.g. social distancing, sheltering in place) in an effort to limit this patient's exposure and mitigate transmission in our community. All issues noted in this document were discussed and addressed.  A physical exam was not performed with this format.  I connected with Brittany Conrad on 11/27/21 at 1202 by telephone and verified that I am speaking with the correct person using two identifiers. Brittany Conrad is currently located at home and no one is currently with her during the visit. The provider, Gwenlyn Perking, FNP is located in their office at time of visit.  I discussed the limitations, risks, security and privacy concerns of performing an evaluation and management service by telephone and the availability of in person appointments. I also discussed with the patient that there may be a patient responsible charge related to this service. The patient expressed understanding and agreed to proceed.  CC: COPD  History and Present Illness:  HPI Brittany Conrad reports dyspnea with activity. She has a pulse ox monitor at home and reports that her oxygen saturation will drop in the 80s when she is up walking around. She has been prescribed oxygen from home use in the past but sent it back due to the cost. She has been using breztri as prescribed and albuterol prn. She denies fever, chills, worsening cough or sputum, congestion, edema, or chest pain. She has not been able to be seen in the office due to transportation issues. She has been referred to pulmonology but has not been able to make it to those appointment due to lack of transportation. She is established with CCM but was told that she has used up her allotted transportation assistance. Patient has an appt with PCP in 2 weeks.     ROS As per HPI.    Observations/Objective: Alert and oriented x 3. Able to speak in full sentences without difficulty.   Assessment and Plan: Brittany Conrad was seen today for copd.  Diagnoses and all orders for this visit:  Chronic obstructive pulmonary disease, unspecified COPD type ([place]) Appears stable for patient, however she does need to be evaluated in person. She has an appt with PCP in 2 weeks. Will sent note to CCM to see if they can assist patient with transportation to visit for evaluation for home oxygen. Continue breztri and albuterol prn.     Follow Up Instructions: Keep scheduled appt with PCP.     I discussed the assessment and treatment plan with the patient. The patient was provided an opportunity to ask questions and all were answered. The patient agreed with the plan and demonstrated an understanding of the instructions.   The patient was advised to call back or seek an in-person evaluation if the symptoms worsen or if the condition fails to improve as anticipated.  The above assessment and management plan was discussed with the patient. The patient verbalized understanding of and has agreed to the management plan. Patient is aware to call the clinic if symptoms persist or worsen. Patient is aware when to return to the clinic for a follow-up visit. Patient educated on when it is appropriate to go to the emergency department.   Time call ended:  1216  I provided 14 minutes of  non face-to-face time during this encounter.    Gwenlyn Perking, FNP

## 2021-11-27 NOTE — Chronic Care Management (AMB) (Signed)
Chronic Care Management   CCM RN Visit Note  11/27/2021 Name: Brittany Conrad MRN: [id number] DOB: 28-Mar-1951  Subjective: Brittany Conrad is a 71 y.o. year old female who is a primary care patient of Stacks, Broadus John, MD. The care management team was consulted for assistance with disease management and care coordination needs.    Consulted by Harlow Mares, FNP  for  care coordination  in response to provider referral for case management and/or care coordination services.   Consent to Services:  The patient was given information about Chronic Care Management services, agreed to services, and gave verbal consent prior to initiation of services.  Please see initial visit note for detailed documentation.   Patient agreed to services and verbal consent obtained.   Assessment: Review of patient past medical history, allergies, medications, health status, including review of consultants reports, laboratory and other test data, was performed as part of comprehensive evaluation and provision of chronic care management services.   SDOH (Social Determinants of Health) assessments and interventions performed:    CCM Care Plan  Allergies  Allergen Reactions   Tramadol     Can't take d/t pain clinic    Outpatient Encounter Medications as of 11/27/2021  Medication Sig Note   albuterol (PROVENTIL) (2.5 MG/3ML) 0.083% nebulizer solution INHALE 3 ML BY NEBULIZATION EVERY 6 HOURS AS NEEDED FOR WHEEZING OR SHORTNESS OF BREATH    albuterol (VENTOLIN HFA) 108 (90 Base) MCG/ACT inhaler INHALE 1-2 PUFFS BY MOUTH EVERY 6 HOURS AS NEEDED FOR WHEEZE OR SHORTNESS OF BREATH    amitriptyline (ELAVIL) 100 MG tablet TAKE 2 TABLETS BY MOUTH AT BEDTIME.    amLODipine (NORVASC) 10 MG tablet Take 1 tablet (10 mg total) by mouth daily.    ARIPiprazole (ABILIFY) 30 MG tablet Take 1 tablet (30 mg total) by mouth daily.    Budeson-Glycopyrrol-Formoterol (BREZTRI AEROSPHERE) 160-9-4.8 MCG/ACT AERO Inhale 2 puffs into the  lungs 2 (two) times daily.    busPIRone (BUSPAR) 10 MG tablet Take 1 tablet (10 mg total) by mouth 3 (three) times daily.    clonazePAM (KLONOPIN) 0.5 MG tablet Take 0.5 mg by mouth daily as needed.    [facility] D3 25 MCG (1000 UT) capsule TAKE 1 CAPSULE (1,000 UNITS TOTAL) BY MOUTH DAILY.    DULoxetine (CYMBALTA) 60 MG capsule Take 1 capsule (60 mg total) by mouth daily.    ferrous sulfate 325 (65 FE) MG tablet Take 325 mg by mouth every morning.    furosemide (LASIX) 40 MG tablet Take 1 tablet (40 mg total) by mouth 2 (two) times daily. Patient must be seen for any further refills.    gabapentin (NEURONTIN) 400 MG capsule Take 400 mg by mouth 3 (three) times daily.    HYDROcodone-acetaminophen (NORCO) 7.5-325 MG tablet Take 1 tablet by mouth every 6 (six) hours as needed for moderate pain. 11/20/2020: Taking QID   ibuprofen (ADVIL,MOTRIN) 800 MG tablet Take 800 mg by mouth 3 (three) times daily as needed.  (Patient not taking: Reported on 11/23/2021)    ipratropium (ATROVENT) 0.02 % nebulizer solution Take 2.5 mLs (0.5 mg total) by nebulization 4 (four) times daily.    KLOR-CON M20 20 MEQ tablet TAKE 2 TABLETS (40 MEQ TOTAL) BY MOUTH 3 (THREE) TIMES DAILY. FOR POTASSIUM REPLACEMENT/ SUPPLEMENT    levothyroxine (SYNTHROID) 100 MCG tablet Take 1 tablet (100 mcg total) by mouth daily.    meclizine (ANTIVERT) 25 MG tablet TAKE 1 TABLET BY MOUTH EVERY 8 HOURS AS NEEDED FOR VERTIGO  OLANZapine (ZYPREXA) 5 MG tablet Take 5 mg by mouth at bedtime.    pantoprazole (PROTONIX) 40 MG tablet TAKE 1 TABLET BY MOUTH TWICE A DAY    sucralfate (CARAFATE) 1 g tablet     No facility-administered encounter medications on file as of 11/27/2021.    Patient Active Problem List   Diagnosis Date Noted   Iron deficiency anemia due to chronic blood loss 09/02/2021   Hypokalemia 09/02/2021   Renal insufficiency 08/21/2021   Hypothyroidism 05/18/2021   Arthralgia of both knees 02/09/2021   Frequent falls 02/09/2021    Urinary tract infection without hematuria 02/09/2021   Cough 12/25/2020   Leukocytosis 11/29/2020   Pneumonia 11/29/2020   Sepsis (HCC) 11/29/2020   Chronic congestive heart failure, unspecified heart failure type (HCC) 10/23/2020   Essential hypertension 10/23/2020   Benzodiazepine dependence, continuous (HCC) 10/23/2020   Chronic pain syndrome 10/23/2020   Acute respiratory failure (HCC) 09/07/2020   MDD (major depressive disorder), recurrent episode, mild (HCC) 09/07/2019   GAD (generalized anxiety disorder) 03/01/2019   Normocytic anemia 04/13/2018   COPD (chronic obstructive pulmonary disease) (HCC) 04/13/2018    Conditions to be addressed/monitored:HTN, COPD, and transportation needs  [facility] : [facility]  Updates made by Gwenith DailyHudy, Dominik Lauricella N, RN since 11/27/2021 12:00 AM     Problem: Chronic Disease Management Needs   Priority: Medium     Long-Range Goal: [facility] Regarding Care Management and Care Coordination Associated with Chronic Disease States   Start Date: 02/20/2021  This Visit's Progress: On track  Recent Progress: On track  Priority: Medium  Note:   Current Barriers:  Chronic Disease Management support and education needs related to CHF, HTN, and COPD Transportation barriers  RNCM Clinical Goal(s):  Patient will continue to work with RN Care Manager to address care management and care coordination needs related to CHF, HTN, and COPD  through collaboration with RN Care manager, provider, and care team.   Interventions: 1:1 collaboration with primary care provider regarding development and update of comprehensive plan of care as evidenced by provider attestation and co-signature Inter-disciplinary care team collaboration (see longitudinal plan of care) Evaluation of current treatment plan related to  self management and patient's adherence to plan as established by provider  SDOH Barriers (Status: New goal.) Short Term Goal  Consulted by Harlow Maresiffany  Morgan, FNP regarding need for transportation assistance       Patient interviewed and appropriate assessments performed Referred patient to community resources care guide team for assistance with transportation to appointment with PCP on 12/10/21 Advised to call CCM team as needed   Heart Failure Interventions:  (Status: Condition stable. Not addressed this visit.) Provided education on low sodium diet; Advised patient to weigh each morning after emptying bladder; Discussed importance of daily weight and advised patient to weigh and record daily; Reviewed role of diuretics in prevention of fluid overload and management of heart failure; Discussed the importance of keeping all appointments with provider; Provided patient with education about the role of exercise in the management of heart failure; Assessed social determinant of health barriers;    COPD: (Status: Condition stable. Not addressed this visit.) Advised patient to track and manage COPD triggers;  Provided instruction about proper use of medications used for management of COPD including inhalers; Advised patient to self assesses COPD action plan zone and make appointment with provider if in the yellow zone for 48 hours without improvement; Advised patient to engage in light exercise as tolerated 3-5 days  a week to aid in the the management of COPD; Provided education about and advised patient to utilize infection prevention strategies to reduce risk of respiratory infection; Discussed the importance of adequate rest and management of fatigue with COPD; Discussed recent hospitalization for pneumonia and discharge on 02/16/21 Provided with RNCM contact number and encouraged to reach out as needed   Falls:  (Status: Condition stable. Not addressed this visit.) Reviewed medications and discussed potential side effects of medications such as dizziness and frequent urination Advised patient of importance of notifying provider of  falls Assessed for falls since last encounter Assessed patients knowledge of fall risk prevention secondary to previously provided education Provided patient information for fall alert systems Discussed use of assistive devices. Currently using cane  Discussed vertigo and the role it plays in falls Collaborated with [facility] clinical staff regarding refill of meclizine. Patient was prescribed this at [facility] and it helped.  Reviewed and reconciled medication list. [facility]clizine [facility]  Discussed home health nursing and PT services. Verbal order to continue services was given yesterday by PCP office.   Hypertension: (Status: Condition stable. Not addressed this visit.) Last practice recorded BP readings:  BP Readings from Last 3 Encounters:  03/17/21 116/71  10/23/20 127/86  09/16/20 113/78  Evaluation of current treatment plan related to hypertension self management and patient's adherence to plan as established by provider;   Reviewed medications with patient and discussed importance of compliance;  Counseled on the importance of exercise goals with target of 150 minutes per week Discussed plans with patient for ongoing care management follow up and provided patient with direct contact information for care management team; Advised patient, providing education and rationale, to monitor blood pressure daily and record, calling PCP for findings outside established parameters;  Discussed complications of poorly controlled blood pressure such as heart disease, stroke, circulatory complications, vision complications, kidney impairment, sexual dysfunction;    Hypothyroidism:  (Status: Condition stable. Not addressed this visit.) Discussed diagnosis of hypothyroidism during SNF stay and addition of Levothyroxine daily Reviewed upcoming appt with PCP in Nov Collaborated with clinical staff to request refill of levothyroxine Advised patient that a two week supply  [facility] discharge on 03/15/21 and that she should be able to pick that up Discussed importance of medication compliance Asked patient to call RNCM with any questions or concerns   Patient Goals/Self-Care Activities: Patient will self administer medications as prescribed Patient will attend all scheduled provider appointments Patient will call pharmacy for medication refills Patient will continue to perform ADL's independently Patient will continue to perform IADL's independently Patient will call provider office for new concerns or questions Work with [facility] Guides to arrange transportation to appointment with Dr Darlyn Read on 12/10/21 Call RN Care Manager as needed [phone]  Plan:Telephone follow up appointment with care management team member scheduled for:  12/16/21 with LCSW The patient has been provided with contact information for the care management team and has been advised to call with any health related questions or concerns.   Demetrios Loll, BSN, RN-BC Embedded Chronic Care Manager [facility] Care Management Direct Dial: [phone]

## 2021-11-27 NOTE — Patient Instructions (Signed)
Visit Information  Thank you for taking time to visit with me today. Please don't hesitate to contact me if I can be of assistance to you before our next scheduled telephone appointment.  Following are the goals we discussed today:  Patient will self administer medications as prescribed Patient will attend all scheduled provider appointments Patient will call pharmacy for medication refills Patient will continue to perform ADL's independently Patient will continue to perform IADL's independently Patient will call provider office for new concerns or questions Work with [facility] Guides to arrange transportation to appointment with Dr Livia Snellen on 12/10/21 Call [place] as needed [phone]  You will receive a call from our [place] to help arrange transportation to your appointment with Dr Livia Snellen on 12/10/21.   Your next appointment is by telephone on 12/16/21 at 1:00pm with Theadore Nan, LCSW  Please call the care guide team at [phone] if you need to cancel or reschedule your appointment.   If you are experiencing a Mental Health or [place] or need someone to talk to, please call the [facility]: [phone] call [phone]   The patient verbalized understanding of instructions, educational materials, and care plan provided today and agreed to receive a mailed copy of patient instructions, educational materials, and care plan.   Chong Sicilian, BSN, RN-BC Embedded Chronic Care Manager [facility] / [place] [facility] Direct Dial: [phone]

## 2021-12-01 ENCOUNTER — Telehealth: Payer: Self-pay

## 2021-12-01 NOTE — Telephone Encounter (Signed)
   Telephone encounter was:  Successful.  12/01/2021 Name: [name] MRN: [id number] DOB: [birth year]  Brittany Conrad is a 71 y.o. year old female who is a primary care patient of Stacks, Cletus Gash, MD . The community resource team was consulted for assistance with Transportation Needs   Care guide performed the following interventions: Patient provided with information about care guide support team and interviewed to confirm resource needs.Patient has transportation with [facility] and has transportation set up for tomorrows appointment   Follow Up Plan:  No further follow up planned at this time. The patient has been provided with needed resources.    [place], Care Management  [phone] [address], [place], [place] [address] Phone: [phone] Email: [email or web address]

## 2021-12-01 NOTE — Telephone Encounter (Signed)
   Telephone encounter was:  Successful.  12/01/2021 Name: Brittany Conrad MRN: [id number] DOB: 10-May-1951  Brittany Conrad is a 71 y.o. year old female who is a primary care patient of Stacks, Broadus John, MD . The community resource team was consulted for assistance with Transportation Needs   Care guide performed the following interventions: Patient provided with information about care guide support team and interviewed to confirm resource needs.Patient stated  she is out of rides with her insurance and needs other options. Patient asked if I could call her back after 11:00 on 5/30  Follow Up Plan:  Care guide will follow up with patient by phone over the next day    [facility] Guide, Embedded Care Coordination [facility], Care Management  [phone] [address] [place], [place], [place] [address] Phone: [phone] Email: [email or web address]

## 2021-12-02 DIAGNOSIS — Z87891 Personal history of nicotine dependence: Secondary | ICD-10-CM | POA: Diagnosis not present

## 2021-12-02 DIAGNOSIS — I509 Heart failure, unspecified: Secondary | ICD-10-CM

## 2021-12-02 DIAGNOSIS — E039 Hypothyroidism, unspecified: Secondary | ICD-10-CM | POA: Diagnosis not present

## 2021-12-02 DIAGNOSIS — J449 Chronic obstructive pulmonary disease, unspecified: Secondary | ICD-10-CM

## 2021-12-02 DIAGNOSIS — I110 Hypertensive heart disease with heart failure: Secondary | ICD-10-CM | POA: Diagnosis not present

## 2021-12-03 DIAGNOSIS — M25561 Pain in right knee: Secondary | ICD-10-CM | POA: Diagnosis not present

## 2021-12-03 DIAGNOSIS — M25562 Pain in left knee: Secondary | ICD-10-CM | POA: Diagnosis not present

## 2021-12-03 DIAGNOSIS — F319 Bipolar disorder, unspecified: Secondary | ICD-10-CM | POA: Diagnosis not present

## 2021-12-03 DIAGNOSIS — Z79899 Other long term (current) drug therapy: Secondary | ICD-10-CM | POA: Diagnosis not present

## 2021-12-03 DIAGNOSIS — G609 Hereditary and idiopathic neuropathy, unspecified: Secondary | ICD-10-CM | POA: Diagnosis not present

## 2021-12-09 ENCOUNTER — Inpatient Hospital Stay: Admission: RE | Admit: 2021-12-09 | Payer: Medicare HMO | Source: Ambulatory Visit

## 2021-12-10 ENCOUNTER — Ambulatory Visit (INDEPENDENT_AMBULATORY_CARE_PROVIDER_SITE_OTHER): Payer: Medicare HMO | Admitting: Family Medicine

## 2021-12-10 ENCOUNTER — Encounter: Payer: Self-pay | Admitting: Family Medicine

## 2021-12-10 VITALS — BP 128/72 | HR 103 | Temp 97.0°F | Ht 62.0 in | Wt 155.0 lb

## 2021-12-10 DIAGNOSIS — E039 Hypothyroidism, unspecified: Secondary | ICD-10-CM | POA: Diagnosis not present

## 2021-12-10 DIAGNOSIS — J441 Chronic obstructive pulmonary disease with (acute) exacerbation: Secondary | ICD-10-CM

## 2021-12-10 DIAGNOSIS — F330 Major depressive disorder, recurrent, mild: Secondary | ICD-10-CM

## 2021-12-10 DIAGNOSIS — Z23 Encounter for immunization: Secondary | ICD-10-CM

## 2021-12-10 MED ORDER — LEVOTHYROXINE SODIUM 100 MCG PO TABS: 100.0000 ug | ORAL_TABLET | Freq: Every day | ORAL | 0 refills | Status: DC

## 2021-12-10 NOTE — Progress Notes (Signed)
Subjective:  Patient ID: Brittany Conrad, female    DOB: 07-Dec-1950  Age: 71 y.o. MRN: [id number]  CC: Medical Management of Chronic Issues (2 week)   HPI Brittany Conrad presents for follow up of depression. No longer miserable. More active. Happier. Still doesn't know anyone. Doesn't want to leave home. Feels the current med combination is good.  Waking up at night dyspneic. Concerned for worsening of COPD using inhalers as directed. No wheezing.      12/10/2021    1:15 PM 11/23/2021   12:12 PM 11/02/2021   10:40 AM 07/27/2021   11:49 AM 05/18/2021    1:42 PM  Depression screen PHQ 2/9  Decreased Interest 1 1 3 3  0  Down, Depressed, Hopeless  1 2 2  0  PHQ - 2 Score 1 2 5 5  0  Altered sleeping 0 1 3 3    Tired, decreased energy 1 1 2 2    Change in appetite 0 0 1 2   Feeling bad or failure about yourself  2 1 2 2    Trouble concentrating 2 1 2 2    Moving slowly or fidgety/restless 0 0 1 1   Suicidal thoughts 0 0 0 0   PHQ-9 Score 6 6 16 17    Difficult doing work/chores Not difficult at all Somewhat difficult Very difficult Not difficult at all         12/10/2021    1:15 PM 11/23/2021   12:12 PM 11/02/2021   10:40 AM  Depression screen PHQ 2/9  Decreased Interest 1 1 3   Down, Depressed, Hopeless  1 2  PHQ - 2 Score 1 2 5   Altered sleeping 0 1 3  Tired, decreased energy 1 1 2   Change in appetite 0 0 1  Feeling bad or failure about yourself  2 1 2   Trouble concentrating 2 1 2   Moving slowly or fidgety/restless 0 0 1  Suicidal thoughts 0 0 0  PHQ-9 Score 6 6 16   Difficult doing work/chores Not difficult at all Somewhat difficult Very difficult    History Brittany Conrad has a past medical history of Asthma, Bipolar 1 disorder (HCC), COPD (chronic obstructive pulmonary disease) (HCC), Emphysema lung (HCC), Emphysema of lung (HCC), Neuropathy, and Scoliosis.   She has a past surgical history that includes Abdominal hysterectomy; Gallbladder surgery; Breast reduction surgery; tummy tuck;  Back surgery; and Gastric bypass.   Her family history includes Alzheimer's disease in her maternal grandmother; Bipolar disorder in her daughter; Cancer in her father, mother, and paternal grandmother; Stroke in her paternal grandfather; Tuberculosis in her maternal grandfather.She reports that she quit smoking about 5 years ago. Her smoking use included cigarettes. She has a 70.00 pack-year smoking history. She has never used smokeless tobacco. She reports current alcohol use. She reports that she does not use drugs.    ROS Review of Systems  Constitutional: Negative.   HENT: Negative.    Eyes:  Negative for visual disturbance.  Respiratory:  Negative for shortness of breath.   Cardiovascular:  Negative for chest pain.  Gastrointestinal:  Negative for abdominal pain.  Musculoskeletal:  Negative for arthralgias.    Objective:  BP 128/72   Pulse (!) 103   Temp (!) 97 F (36.1 C)   Ht 5\' 2"  (1.575 m)   Wt 155 lb (70.3 kg)   SpO2 93%   BMI 28.35 kg/m   BP Readings from Last 3 Encounters:  12/10/21 128/72  09/02/21 108/71  07/27/21 127/75    Wt Readings from  Last 3 Encounters:  12/10/21 155 lb (70.3 kg)  11/23/21 150 lb (68 kg)  09/02/21 150 lb 8 oz (68.3 kg)     Physical Exam Constitutional:      General: She is not in acute distress.    Appearance: She is well-developed.  Cardiovascular:     Rate and Rhythm: Normal rate and regular rhythm.  Pulmonary:     Breath sounds: Normal breath sounds.  Musculoskeletal:        General: Normal range of motion.  Skin:    General: Skin is warm and dry.  Neurological:     Mental Status: She is alert and oriented to person, place, and time.       Assessment & Plan:   Lataisha was seen today for medical management of chronic issues.  Diagnoses and all orders for this visit:  Chronic obstructive pulmonary disease with acute exacerbation (HCC) -     Pulse oximetry, overnight; Future  Acquired hypothyroidism -      levothyroxine (SYNTHROID) 100 MCG tablet; Take 1 tablet (100 mcg total) by mouth daily.  MDD (major depressive disorder), recurrent episode, mild (HCC)  Other orders -     Varicella-zoster vaccine IM (Shingrix)       I am having Brittany Juniper maintain her ibuprofen, CVS D3, HYDROcodone-acetaminophen, gabapentin, sucralfate, ipratropium, amLODipine, albuterol, pantoprazole, Klor-Con M20, albuterol, Breztri Aerosphere, amitriptyline, furosemide, busPIRone, meclizine, DULoxetine, ARIPiprazole, OLANZapine, ferrous sulfate, clonazePAM, and levothyroxine.  Allergies as of 12/10/2021       Reactions   Tramadol    Can't take d/t pain clinic        Medication List        Accurate as of December 10, 2021  2:34 PM. If you have any questions, ask your nurse or doctor.          albuterol (2.5 MG/3ML) 0.083% nebulizer solution Commonly known as: PROVENTIL INHALE 3 ML BY NEBULIZATION EVERY 6 HOURS AS NEEDED FOR WHEEZING OR SHORTNESS OF BREATH   albuterol 108 (90 Base) MCG/ACT inhaler Commonly known as: VENTOLIN HFA INHALE 1-2 PUFFS BY MOUTH EVERY 6 HOURS AS NEEDED FOR WHEEZE OR SHORTNESS OF BREATH   amitriptyline 100 MG tablet Commonly known as: ELAVIL TAKE 2 TABLETS BY MOUTH AT BEDTIME.   amLODipine 10 MG tablet Commonly known as: NORVASC Take 1 tablet (10 mg total) by mouth daily.   ARIPiprazole 30 MG tablet Commonly known as: ABILIFY Take 1 tablet (30 mg total) by mouth daily.   Breztri Aerosphere 160-9-4.8 MCG/ACT Aero Generic drug: Budeson-Glycopyrrol-Formoterol Inhale 2 puffs into the lungs 2 (two) times daily.   busPIRone 10 MG tablet Commonly known as: BUSPAR Take 1 tablet (10 mg total) by mouth 3 (three) times daily.   clonazePAM 0.5 MG tablet Commonly known as: KLONOPIN Take 0.5 mg by mouth daily as needed.   CVS D3 25 MCG (1000 UT) capsule Generic drug: Cholecalciferol TAKE 1 CAPSULE (1,000 UNITS TOTAL) BY MOUTH DAILY.   DULoxetine 60 MG capsule Commonly  known as: Cymbalta Take 1 capsule (60 mg total) by mouth daily.   ferrous sulfate 325 (65 FE) MG tablet Take 325 mg by mouth every morning.   furosemide 40 MG tablet Commonly known as: LASIX Take 1 tablet (40 mg total) by mouth 2 (two) times daily. Patient must be seen for any further refills.   gabapentin 400 MG capsule Commonly known as: NEURONTIN Take 400 mg by mouth 3 (three) times daily.   HYDROcodone-acetaminophen 7.5-325 MG tablet Commonly known as:  NORCO Take 1 tablet by mouth every 6 (six) hours as needed for moderate pain.   ibuprofen 800 MG tablet Commonly known as: ADVIL Take 800 mg by mouth 3 (three) times daily as needed.   ipratropium 0.02 % nebulizer solution Commonly known as: ATROVENT Take 2.5 mLs (0.5 mg total) by nebulization 4 (four) times daily.   Klor-Con M20 20 MEQ tablet Generic drug: potassium chloride SA TAKE 2 TABLETS (40 MEQ TOTAL) BY MOUTH 3 (THREE) TIMES DAILY. FOR POTASSIUM REPLACEMENT/ SUPPLEMENT   levothyroxine 100 MCG tablet Commonly known as: SYNTHROID Take 1 tablet (100 mcg total) by mouth daily.   meclizine 25 MG tablet Commonly known as: ANTIVERT TAKE 1 TABLET BY MOUTH EVERY 8 HOURS AS NEEDED FOR VERTIGO   OLANZapine 5 MG tablet Commonly known as: ZYPREXA Take 5 mg by mouth at bedtime.   pantoprazole 40 MG tablet Commonly known as: PROTONIX TAKE 1 TABLET BY MOUTH TWICE A DAY   sucralfate 1 g tablet Commonly known as: CARAFATE         Follow-up: Return in about 3 months (around 03/12/2022) for Depression, Anxiety, COPD.  Mechele Claude, M.D.

## 2021-12-16 ENCOUNTER — Telehealth: Payer: Medicare HMO

## 2021-12-22 DIAGNOSIS — R0902 Hypoxemia: Secondary | ICD-10-CM | POA: Diagnosis not present

## 2021-12-22 DIAGNOSIS — J189 Pneumonia, unspecified organism: Secondary | ICD-10-CM | POA: Diagnosis not present

## 2021-12-22 DIAGNOSIS — J441 Chronic obstructive pulmonary disease with (acute) exacerbation: Secondary | ICD-10-CM | POA: Diagnosis not present

## 2021-12-28 ENCOUNTER — Other Ambulatory Visit: Payer: Medicare HMO

## 2021-12-28 DIAGNOSIS — F319 Bipolar disorder, unspecified: Secondary | ICD-10-CM | POA: Diagnosis not present

## 2021-12-28 DIAGNOSIS — M25561 Pain in right knee: Secondary | ICD-10-CM | POA: Diagnosis not present

## 2021-12-28 DIAGNOSIS — G609 Hereditary and idiopathic neuropathy, unspecified: Secondary | ICD-10-CM | POA: Diagnosis not present

## 2021-12-28 DIAGNOSIS — Z79899 Other long term (current) drug therapy: Secondary | ICD-10-CM | POA: Diagnosis not present

## 2021-12-28 DIAGNOSIS — M25562 Pain in left knee: Secondary | ICD-10-CM | POA: Diagnosis not present

## 2021-12-30 ENCOUNTER — Other Ambulatory Visit: Payer: Self-pay | Admitting: Emergency Medicine

## 2021-12-30 MED ORDER — AMITRIPTYLINE HCL 100 MG PO TABS: 200.0000 mg | ORAL_TABLET | Freq: Every day | ORAL | 0 refills | Status: DC

## 2021-12-30 NOTE — Telephone Encounter (Signed)
LOV 12/10/2021

## 2021-12-31 ENCOUNTER — Other Ambulatory Visit: Payer: Self-pay | Admitting: Family Medicine

## 2021-12-31 DIAGNOSIS — E039 Hypothyroidism, unspecified: Secondary | ICD-10-CM

## 2022-01-13 ENCOUNTER — Telehealth: Payer: Self-pay

## 2022-01-13 DIAGNOSIS — H5213 Myopia, bilateral: Secondary | ICD-10-CM | POA: Diagnosis not present

## 2022-01-13 DIAGNOSIS — H524 Presbyopia: Secondary | ICD-10-CM | POA: Diagnosis not present

## 2022-01-13 NOTE — Telephone Encounter (Signed)
Screening mammogram ordered for mobile unit patient has canceled mult appointments. Sent letter for patient to call fto reschedule appointment.

## 2022-01-20 ENCOUNTER — Other Ambulatory Visit: Payer: Self-pay | Admitting: Family Medicine

## 2022-01-26 ENCOUNTER — Ambulatory Visit: Payer: Self-pay | Admitting: *Deleted

## 2022-01-26 DIAGNOSIS — J441 Chronic obstructive pulmonary disease with (acute) exacerbation: Secondary | ICD-10-CM

## 2022-01-26 NOTE — Chronic Care Management (AMB) (Signed)
  Chronic Care Management   Note  01/26/2022 Name: Doneen Ollinger MRN: [id number] DOB: March 04, 1951   Patient has either met RN Care Management goals, is stable from RN Care Management perspective, or has not recently engaged with the RN Care Manager. I am removing RN Care Manager from Care Team and closing [facility]. If patient is currently engaged with another CCM team member I will forward this encounter to inform them of my case closure. Their PCP can place a new referral if the patient needs Care Management or Care Coordination services in the future.  Chong Sicilian, BSN, RN-BC Embedded Chronic Care Manager [facility] / [facility] [address] Management Direct Dial: [phone]

## 2022-01-26 NOTE — Patient Instructions (Signed)
[name]  I have previously worked with you through the [facility] Management Program at [facility]. Due to program changes I am removing myself from your care team because you've either met our goals, your conditions are stable and no longer require care management, or we haven't engaged within the past 6 months. If you are currently active with another CCM Team Member, you will remain active with them unless they reach out to you with additional information. If you feel that you need RN Care Management services in the future,  please talk with your primary care provider and request a new referral for Care Management or Care Coordination. This does not affect your status as a patient at [facility].   Thank you for allowing me to participate in your your healthcare journey.  Chong Sicilian, BSN, RN-BC Embedded Chronic Care Manager [facility] / [place] Management Direct Dial: [phone]

## 2022-02-01 ENCOUNTER — Other Ambulatory Visit: Payer: Self-pay | Admitting: Family Medicine

## 2022-02-01 DIAGNOSIS — G609 Hereditary and idiopathic neuropathy, unspecified: Secondary | ICD-10-CM | POA: Diagnosis not present

## 2022-02-01 DIAGNOSIS — M25561 Pain in right knee: Secondary | ICD-10-CM | POA: Diagnosis not present

## 2022-02-01 DIAGNOSIS — F319 Bipolar disorder, unspecified: Secondary | ICD-10-CM | POA: Diagnosis not present

## 2022-02-01 DIAGNOSIS — M25562 Pain in left knee: Secondary | ICD-10-CM | POA: Diagnosis not present

## 2022-02-01 DIAGNOSIS — Z79899 Other long term (current) drug therapy: Secondary | ICD-10-CM | POA: Diagnosis not present

## 2022-02-01 DIAGNOSIS — J449 Chronic obstructive pulmonary disease, unspecified: Secondary | ICD-10-CM | POA: Diagnosis not present

## 2022-02-03 ENCOUNTER — Other Ambulatory Visit: Payer: Self-pay | Admitting: Family Medicine

## 2022-02-04 ENCOUNTER — Other Ambulatory Visit: Payer: Self-pay | Admitting: Family Medicine

## 2022-02-16 ENCOUNTER — Ambulatory Visit: Payer: Medicare HMO

## 2022-02-16 ENCOUNTER — Encounter: Payer: Self-pay | Admitting: Family Medicine

## 2022-02-16 ENCOUNTER — Ambulatory Visit (INDEPENDENT_AMBULATORY_CARE_PROVIDER_SITE_OTHER): Payer: Medicare HMO | Admitting: Family Medicine

## 2022-02-16 VITALS — BP 133/78 | HR 90 | Temp 98.3°F

## 2022-02-16 DIAGNOSIS — Z23 Encounter for immunization: Secondary | ICD-10-CM | POA: Diagnosis not present

## 2022-02-16 DIAGNOSIS — R051 Acute cough: Secondary | ICD-10-CM

## 2022-02-16 DIAGNOSIS — R0602 Shortness of breath: Secondary | ICD-10-CM

## 2022-02-16 MED ORDER — LEVOFLOXACIN 500 MG PO TABS: 500.0000 mg | ORAL_TABLET | Freq: Every day | ORAL | 0 refills | Status: DC

## 2022-02-16 NOTE — Progress Notes (Signed)
Subjective:  Patient ID: Brittany Conrad, female    DOB: 1950/12/03  Age: 71 y.o. MRN: [id number]  CC: COPD   HPI [name] presents for dyspnea. Needs O2 for home use. PRescribed a time of hospital DC, but payment was $300+ per month. She has found a system her insurance covers for much less. She is checking at home and found sats to be around 82% at rest. Her pain doctor has told her he would decrease her pain meds if she didn't get assessed for oxygen dependence for her COPD.   Pt. Found [employer], stated they could provide her with O2 for $37 per month. They would provide a portable &/or stationery concentrator. Phone # for [employer] : [phone]  Resp test showed: seated, room air: 89%          Walking, room air, 85%        Walking, 2L 02, 91%        History [name] has a past medical history of Asthma, Bipolar 1 disorder (HCC), COPD (chronic obstructive pulmonary disease) (HCC), Emphysema lung (HCC), Emphysema of lung (HCC), Neuropathy, and Scoliosis.   She has a past surgical history that includes Abdominal hysterectomy; Gallbladder surgery; Breast reduction surgery; tummy tuck; Back surgery; and Gastric bypass.   Her family history includes Alzheimer's disease in her maternal grandmother; Bipolar disorder in her daughter; Cancer in her father, mother, and paternal grandmother; Stroke in her paternal grandfather; Tuberculosis in her maternal grandfather.She reports that she quit smoking about 5 years ago. Her smoking use included cigarettes. She has a 70.00 pack-year smoking history. She has never used smokeless tobacco. She reports current alcohol use. She reports that she does not use drugs.    ROS Review of Systems  Constitutional:  Positive for fatigue.  HENT: Negative.    Eyes:  Negative for visual disturbance.  Respiratory:  Positive for cough and shortness of breath.   Cardiovascular:  Negative for chest pain.  Gastrointestinal:  Negative for abdominal pain.   Musculoskeletal:  Negative for arthralgias.    Objective:  BP 133/78   Pulse 90   Temp 98.3 F (36.8 C)   SpO2 (!) 89%   BP Readings from Last 3 Encounters:  02/16/22 133/78  12/10/21 128/72  09/02/21 108/71    Wt Readings from Last 3 Encounters:  12/10/21 155 lb (70.3 kg)  11/23/21 150 lb (68 kg)  09/02/21 150 lb 8 oz (68.3 kg)     Physical Exam Constitutional:      General: She is not in acute distress.    Appearance: She is well-developed.  Cardiovascular:     Rate and Rhythm: Normal rate and regular rhythm.  Pulmonary:     Breath sounds: Wheezing present.     Comments: Diminished throughout.  Decreased expiration to inspiration ratio Abdominal:     General: There is no distension.     Palpations: Abdomen is soft. There is no mass.     Tenderness: There is no abdominal tenderness.  Musculoskeletal:        General: Normal range of motion.  Skin:    General: Skin is warm and dry.  Neurological:     Mental Status: She is alert and oriented to person, place, and time.       Assessment & Plan:   Burnell was seen today for copd.  Diagnoses and all orders for this visit:  Shortness of breath -     DG Chest 2 View; Future -  For home use only DME oxygen  Acute cough -     DG Chest 2 View; Future  Need for shingles vaccine -     Zoster Recombinant (Shingrix )  Other orders -     levofloxacin (LEVAQUIN) 500 MG tablet; Take 1 tablet (500 mg total) by mouth daily. For 10 days       I am having Brittany Conrad start on levofloxacin. I am also having her maintain her ibuprofen, CVS D3, HYDROcodone-acetaminophen, gabapentin, sucralfate, ipratropium, albuterol, Klor-Con M20, albuterol, Breztri Aerosphere, meclizine, DULoxetine, ferrous sulfate, clonazePAM, levothyroxine, amitriptyline, amLODipine, furosemide, OLANZapine, ARIPiprazole, busPIRone, and pantoprazole.  Allergies as of 02/16/2022       Reactions   Tramadol    Can't take d/t pain clinic         Medication List        Accurate as of February 16, 2022  5:41 PM. If you have any questions, ask your nurse or doctor.          albuterol (2.5 MG/3ML) 0.083% nebulizer solution Commonly known as: PROVENTIL INHALE 3 ML BY NEBULIZATION EVERY 6 HOURS AS NEEDED FOR WHEEZING OR SHORTNESS OF BREATH   albuterol 108 (90 Base) MCG/ACT inhaler Commonly known as: VENTOLIN HFA INHALE 1-2 PUFFS BY MOUTH EVERY 6 HOURS AS NEEDED FOR WHEEZE OR SHORTNESS OF BREATH   amitriptyline 100 MG tablet Commonly known as: ELAVIL Take 2 tablets (200 mg total) by mouth at bedtime.   amLODipine 10 MG tablet Commonly known as: NORVASC TAKE 1 TABLET BY MOUTH EVERY DAY   ARIPiprazole 30 MG tablet Commonly known as: ABILIFY TAKE ONE TABLET ONCE DAILY   Breztri Aerosphere 160-9-4.8 MCG/ACT Aero Generic drug: Budeson-Glycopyrrol-Formoterol Inhale 2 puffs into the lungs 2 (two) times daily.   busPIRone 10 MG tablet Commonly known as: BUSPAR TAKE ONE TABLET THREE TIMES DAILY   clonazePAM 0.5 MG tablet Commonly known as: KLONOPIN Take 0.5 mg by mouth daily as needed.   CVS D3 25 MCG (1000 UT) capsule Generic drug: Cholecalciferol TAKE 1 CAPSULE (1,000 UNITS TOTAL) BY MOUTH DAILY.   DULoxetine 60 MG capsule Commonly known as: Cymbalta Take 1 capsule (60 mg total) by mouth daily.   ferrous sulfate 325 (65 FE) MG tablet Take 325 mg by mouth every morning.   furosemide 40 MG tablet Commonly known as: LASIX Take 1 tablet (40 mg total) by mouth 2 (two) times daily.   gabapentin 400 MG capsule Commonly known as: NEURONTIN Take 400 mg by mouth 3 (three) times daily.   HYDROcodone-acetaminophen 7.5-325 MG tablet Commonly known as: NORCO Take 1 tablet by mouth every 6 (six) hours as needed for moderate pain.   ibuprofen 800 MG tablet Commonly known as: ADVIL Take 800 mg by mouth 3 (three) times daily as needed.   ipratropium 0.02 % nebulizer solution Commonly known as: ATROVENT Take 2.5  mLs (0.5 mg total) by nebulization 4 (four) times daily.   Klor-Con M20 20 MEQ tablet Generic drug: potassium chloride SA TAKE 2 TABLETS (40 MEQ TOTAL) BY MOUTH 3 (THREE) TIMES DAILY. FOR POTASSIUM REPLACEMENT/ SUPPLEMENT   levofloxacin 500 MG tablet Commonly known as: LEVAQUIN Take 1 tablet (500 mg total) by mouth daily. For 10 days Started by: Mechele Claude, MD   levothyroxine 100 MCG tablet Commonly known as: SYNTHROID Take 1 tablet (100 mcg total) by mouth daily.   meclizine 25 MG tablet Commonly known as: ANTIVERT TAKE 1 TABLET BY MOUTH EVERY 8 HOURS AS NEEDED FOR VERTIGO   OLANZapine  5 MG tablet Commonly known as: ZYPREXA TAKE ONE TABLET AT BEDTIME   pantoprazole 40 MG tablet Commonly known as: PROTONIX TAKE ONE TABLET ONCE DAILY   sucralfate 1 g tablet Commonly known as: CARAFATE               Durable Medical Equipment  (From admission, onward)           Start     Ordered   02/16/22 0000  For home use only DME oxygen       Question Answer Comment  Length of Need Lifetime   Mode or (Route) Nasal cannula   Liters per Minute 2   Oxygen delivery system Gas      02/16/22 1353             Follow-up: Return in about 6 weeks (around 03/30/2022).  Mechele Claude, M.D.

## 2022-02-18 ENCOUNTER — Telehealth: Payer: Self-pay | Admitting: Family Medicine

## 2022-02-18 ENCOUNTER — Ambulatory Visit: Payer: Medicare HMO | Admitting: Family Medicine

## 2022-02-18 DIAGNOSIS — R069 Unspecified abnormalities of breathing: Secondary | ICD-10-CM | POA: Diagnosis not present

## 2022-02-18 DIAGNOSIS — J432 Centrilobular emphysema: Secondary | ICD-10-CM

## 2022-02-18 DIAGNOSIS — R0902 Hypoxemia: Secondary | ICD-10-CM | POA: Diagnosis not present

## 2022-02-18 DIAGNOSIS — R000 Tachycardia, unspecified: Secondary | ICD-10-CM | POA: Diagnosis not present

## 2022-02-18 DIAGNOSIS — I959 Hypotension, unspecified: Secondary | ICD-10-CM | POA: Diagnosis not present

## 2022-02-18 DIAGNOSIS — R0689 Other abnormalities of breathing: Secondary | ICD-10-CM | POA: Diagnosis not present

## 2022-02-18 NOTE — Telephone Encounter (Signed)
Have you been able to make any contact with the company she asked about?

## 2022-02-19 ENCOUNTER — Telehealth: Payer: Self-pay | Admitting: Family Medicine

## 2022-02-19 DIAGNOSIS — J449 Chronic obstructive pulmonary disease, unspecified: Secondary | ICD-10-CM

## 2022-02-19 MED ORDER — ALBUTEROL SULFATE (2.5 MG/3ML) 0.083% IN NEBU: INHALATION_SOLUTION | RESPIRATORY_TRACT | 5 refills | Status: DC

## 2022-02-19 NOTE — Telephone Encounter (Signed)
  Prescription Request  02/19/2022  Is this a "Controlled Substance" medicine? albuterol (PROVENTIL) (2.5 MG/3ML) 0.083% nebulizer solution  Have you seen your PCP in the last 2 weeks? 0815/2023  If YES, route message to pool  -  If NO, patient needs to be scheduled for appointment.  What is the name of the medication or equipment? albuterol (PROVENTIL) (2.5 MG/3ML) 0.083% nebulizer solution  Have you contacted your pharmacy to request a refill? no   Which pharmacy would you like this sent to? [facility]   Patient notified that their request is being sent to the clinical staff for review and that they should receive a response within 2 business days.

## 2022-02-19 NOTE — Telephone Encounter (Signed)
Aware refill sent to pharmacy ?

## 2022-02-19 NOTE — Telephone Encounter (Signed)
Informed pt that I faxed information to [employer], they sent [place] another form to fill out but they also needed a copy of her current ins card which we do not have on file. Sent her a link to activate her MyChart and she can upload it this way. She remember that [employer] had told her there would be a copay and her buying it. I suggested I send the order to [facility] her in [place] and for them to reach out to her. Community message sent to [employer]

## 2022-02-21 DIAGNOSIS — E039 Hypothyroidism, unspecified: Secondary | ICD-10-CM | POA: Diagnosis not present

## 2022-02-21 DIAGNOSIS — R7989 Other specified abnormal findings of blood chemistry: Secondary | ICD-10-CM | POA: Diagnosis not present

## 2022-02-21 DIAGNOSIS — I509 Heart failure, unspecified: Secondary | ICD-10-CM | POA: Diagnosis not present

## 2022-02-21 DIAGNOSIS — K449 Diaphragmatic hernia without obstruction or gangrene: Secondary | ICD-10-CM | POA: Diagnosis not present

## 2022-02-21 DIAGNOSIS — J849 Interstitial pulmonary disease, unspecified: Secondary | ICD-10-CM | POA: Diagnosis not present

## 2022-02-21 DIAGNOSIS — J441 Chronic obstructive pulmonary disease with (acute) exacerbation: Secondary | ICD-10-CM | POA: Diagnosis not present

## 2022-02-21 DIAGNOSIS — N289 Disorder of kidney and ureter, unspecified: Secondary | ICD-10-CM | POA: Diagnosis not present

## 2022-02-21 DIAGNOSIS — N179 Acute kidney failure, unspecified: Secondary | ICD-10-CM | POA: Diagnosis not present

## 2022-02-21 DIAGNOSIS — R0902 Hypoxemia: Secondary | ICD-10-CM | POA: Diagnosis not present

## 2022-02-21 DIAGNOSIS — D539 Nutritional anemia, unspecified: Secondary | ICD-10-CM | POA: Diagnosis not present

## 2022-02-21 DIAGNOSIS — R000 Tachycardia, unspecified: Secondary | ICD-10-CM | POA: Diagnosis not present

## 2022-02-21 DIAGNOSIS — M549 Dorsalgia, unspecified: Secondary | ICD-10-CM | POA: Diagnosis not present

## 2022-02-21 DIAGNOSIS — I5032 Chronic diastolic (congestive) heart failure: Secondary | ICD-10-CM | POA: Diagnosis not present

## 2022-02-21 DIAGNOSIS — I110 Hypertensive heart disease with heart failure: Secondary | ICD-10-CM | POA: Diagnosis not present

## 2022-02-21 DIAGNOSIS — I700 Atherosclerosis of aorta: Secondary | ICD-10-CM | POA: Diagnosis not present

## 2022-02-21 DIAGNOSIS — G8929 Other chronic pain: Secondary | ICD-10-CM | POA: Diagnosis not present

## 2022-02-21 DIAGNOSIS — R0602 Shortness of breath: Secondary | ICD-10-CM | POA: Diagnosis not present

## 2022-02-21 DIAGNOSIS — F411 Generalized anxiety disorder: Secondary | ICD-10-CM | POA: Diagnosis not present

## 2022-02-21 DIAGNOSIS — M7989 Other specified soft tissue disorders: Secondary | ICD-10-CM | POA: Diagnosis not present

## 2022-02-21 DIAGNOSIS — I959 Hypotension, unspecified: Secondary | ICD-10-CM | POA: Diagnosis not present

## 2022-02-21 DIAGNOSIS — Z20822 Contact with and (suspected) exposure to covid-19: Secondary | ICD-10-CM | POA: Diagnosis not present

## 2022-02-22 ENCOUNTER — Telehealth: Payer: Self-pay | Admitting: Family Medicine

## 2022-02-22 NOTE — Telephone Encounter (Signed)
Patient's sister would like to speak to nurse. Patient is in hospital due to not being able to breathe. Please call back

## 2022-02-22 NOTE — Addendum Note (Signed)
Addended by: Mechele Claude on: 02/22/2022 09:08 AM   Modules accepted: Orders

## 2022-02-22 NOTE — Addendum Note (Signed)
Addended by: Julious Payer D on: 02/22/2022 08:15 AM   Modules accepted: Orders

## 2022-02-22 NOTE — Telephone Encounter (Signed)
Returned call, no answer.

## 2022-02-22 NOTE — Telephone Encounter (Signed)
Order completed, signed.

## 2022-02-23 NOTE — Telephone Encounter (Signed)
[facility] unable to complete order for Oxygen d/t [facility], Order has been faxed to [facility] [phone]

## 2022-02-23 NOTE — Telephone Encounter (Signed)
LMOVM I needed order updated, I had messaged order to [facility], d/t patients insurance - [employer] oxygen order has to go to [facility]. Order has been faxed to them.

## 2022-02-23 NOTE — Telephone Encounter (Signed)
PLEASE CALL SISTER, SHE HAS SOME QUESTIONS REGARDING THE OXYGEN ORDER

## 2022-03-02 DIAGNOSIS — G609 Hereditary and idiopathic neuropathy, unspecified: Secondary | ICD-10-CM | POA: Diagnosis not present

## 2022-03-02 DIAGNOSIS — Z79899 Other long term (current) drug therapy: Secondary | ICD-10-CM | POA: Diagnosis not present

## 2022-03-02 DIAGNOSIS — J449 Chronic obstructive pulmonary disease, unspecified: Secondary | ICD-10-CM | POA: Diagnosis not present

## 2022-03-02 DIAGNOSIS — M25561 Pain in right knee: Secondary | ICD-10-CM | POA: Diagnosis not present

## 2022-03-02 DIAGNOSIS — F319 Bipolar disorder, unspecified: Secondary | ICD-10-CM | POA: Diagnosis not present

## 2022-03-02 DIAGNOSIS — M25562 Pain in left knee: Secondary | ICD-10-CM | POA: Diagnosis not present

## 2022-03-04 ENCOUNTER — Other Ambulatory Visit: Payer: Self-pay | Admitting: Family Medicine

## 2022-03-17 ENCOUNTER — Ambulatory Visit (INDEPENDENT_AMBULATORY_CARE_PROVIDER_SITE_OTHER): Payer: Medicare HMO

## 2022-03-17 DIAGNOSIS — F419 Anxiety disorder, unspecified: Secondary | ICD-10-CM | POA: Diagnosis not present

## 2022-03-17 DIAGNOSIS — J9602 Acute respiratory failure with hypercapnia: Secondary | ICD-10-CM

## 2022-03-17 DIAGNOSIS — Z9981 Dependence on supplemental oxygen: Secondary | ICD-10-CM

## 2022-03-17 DIAGNOSIS — J9601 Acute respiratory failure with hypoxia: Secondary | ICD-10-CM

## 2022-03-17 DIAGNOSIS — Z79891 Long term (current) use of opiate analgesic: Secondary | ICD-10-CM

## 2022-03-17 DIAGNOSIS — I5032 Chronic diastolic (congestive) heart failure: Secondary | ICD-10-CM | POA: Diagnosis not present

## 2022-03-17 DIAGNOSIS — J439 Emphysema, unspecified: Secondary | ICD-10-CM

## 2022-03-17 DIAGNOSIS — I110 Hypertensive heart disease with heart failure: Secondary | ICD-10-CM | POA: Diagnosis not present

## 2022-03-17 DIAGNOSIS — N179 Acute kidney failure, unspecified: Secondary | ICD-10-CM

## 2022-03-17 DIAGNOSIS — F32A Depression, unspecified: Secondary | ICD-10-CM

## 2022-03-17 DIAGNOSIS — G629 Polyneuropathy, unspecified: Secondary | ICD-10-CM | POA: Diagnosis not present

## 2022-03-17 DIAGNOSIS — K219 Gastro-esophageal reflux disease without esophagitis: Secondary | ICD-10-CM

## 2022-03-24 ENCOUNTER — Other Ambulatory Visit: Payer: Self-pay | Admitting: Family Medicine

## 2022-03-25 ENCOUNTER — Ambulatory Visit (INDEPENDENT_AMBULATORY_CARE_PROVIDER_SITE_OTHER): Payer: Medicare HMO | Admitting: Family Medicine

## 2022-03-25 ENCOUNTER — Encounter: Payer: Self-pay | Admitting: Family Medicine

## 2022-03-25 DIAGNOSIS — J441 Chronic obstructive pulmonary disease with (acute) exacerbation: Secondary | ICD-10-CM

## 2022-03-25 DIAGNOSIS — J189 Pneumonia, unspecified organism: Secondary | ICD-10-CM | POA: Diagnosis not present

## 2022-03-25 MED ORDER — PREDNISONE 10 MG (48) PO TBPK: ORAL_TABLET | ORAL | 0 refills | Status: DC

## 2022-03-25 MED ORDER — MOXIFLOXACIN HCL 400 MG PO TABS: 400.0000 mg | ORAL_TABLET | Freq: Every day | ORAL | 0 refills | Status: DC

## 2022-03-25 NOTE — Progress Notes (Signed)
Subjective:    Patient ID: Brittany Conrad, female    DOB: 1951/02/03, 71 y.o.   MRN: [id number]   HPI: Brittany Conrad is a 72 y.o. female presenting for cough. Had to use nebs and inhalers multiple times last night. Started 2 weeks ago. DCed from [facility] 3-4 weeks ago. DCed with home 02 at 3 liters. Had steroid and antibiotic. Cough with gray sputum. Getting dyspneic still. Want sto increase O2. Still using breztri as well.      02/16/2022    1:06 PM 12/10/2021    1:15 PM 11/23/2021   12:12 PM 11/02/2021   10:40 AM 07/27/2021   11:49 AM  Depression screen PHQ 2/9  Decreased Interest 1 1 1 3 3   Down, Depressed, Hopeless 1  1 2 2   PHQ - 2 Score 2 1 2 5 5   Altered sleeping 3 0 1 3 3   Tired, decreased energy 1 1 1 2 2   Change in appetite 2 0 0 1 2  Feeling bad or failure about yourself  1 2 1 2 2   Trouble concentrating 0 2 1 2 2   Moving slowly or fidgety/restless 0 0 0 1 1  Suicidal thoughts 0 0 0 0 0  PHQ-9 Score 9 6 6 16 17   Difficult doing work/chores Somewhat difficult Not difficult at all Somewhat difficult Very difficult Not difficult at all     Relevant past medical, surgical, family and social history reviewed and updated as indicated.  Interim medical history since our last visit reviewed. Allergies and medications reviewed and updated.  ROS:  Review of Systems  Constitutional:  Negative for activity change, appetite change, chills and fever.  HENT:  Positive for congestion. Negative for sneezing and trouble swallowing.   Respiratory:  Positive for cough and shortness of breath. Negative for chest tightness.   Cardiovascular:  Negative for chest pain and palpitations.  Skin:  Negative for rash.     Social History   Tobacco Use  Smoking Status Former   Packs/day: 2.00   Years: 35.00   Total pack years: 70.00   Types: Cigarettes   Quit date: 07/05/2016   Years since quitting: 5.7  Smokeless Tobacco Never       Objective:     Wt Readings from Last 3 Encounters:   12/10/21 155 lb (70.3 kg)  11/23/21 150 lb (68 kg)  09/02/21 150 lb 8 oz (68.3 kg)     Exam deferred. Pt. [place] due to COVID 19. Phone visit performed.   Assessment & Plan:   1. Pneumonia of both lower lobes due to infectious organism   2. Chronic obstructive pulmonary disease with acute exacerbation (HCC)     Meds ordered this encounter  Medications   predniSONE (STERAPRED UNI-PAK 48 TAB) 10 MG (48) TBPK tablet    Sig: Use as directed    Dispense:  48 tablet    Refill:  0   moxifloxacin (AVELOX) 400 MG tablet    Sig: Take 1 tablet (400 mg total) by mouth daily.    Dispense:  10 tablet    Refill:  0    No orders of the defined types were placed in this encounter.     Diagnoses and all orders for this visit:  Pneumonia of both lower lobes due to infectious organism  Chronic obstructive pulmonary disease with acute exacerbation ([place])  Other orders -     predniSONE (STERAPRED UNI-PAK 48 TAB) 10 MG (48) TBPK tablet; Use as directed -  moxifloxacin (AVELOX) 400 MG tablet; Take 1 tablet (400 mg total) by mouth daily.    Virtual Visit via telephone Note  I discussed the limitations, risks, security and privacy concerns of performing an evaluation and management service by telephone and the availability of in person appointments. The patient was identified with two identifiers. Pt.expressed understanding and agreed to proceed. Pt. Is at home. Dr. Darlyn Read is in his office.  Follow Up Instructions:   I discussed the assessment and treatment plan with the patient. The patient was provided an opportunity to ask questions and all were answered. The patient agreed with the plan and demonstrated an understanding of the instructions.   The patient was advised to call back or seek an in-person evaluation if the symptoms worsen or if the condition fails to improve as anticipated.   Total minutes including chart review and phone contact time: 16   Follow up plan: Return  in about 2 weeks (around 04/08/2022).  Mechele Claude, MD [place]

## 2022-03-29 ENCOUNTER — Telehealth: Payer: Self-pay | Admitting: Family Medicine

## 2022-03-29 NOTE — Telephone Encounter (Signed)
Patient aware.

## 2022-03-31 DIAGNOSIS — G8929 Other chronic pain: Secondary | ICD-10-CM | POA: Diagnosis not present

## 2022-03-31 DIAGNOSIS — F319 Bipolar disorder, unspecified: Secondary | ICD-10-CM | POA: Diagnosis not present

## 2022-03-31 DIAGNOSIS — J449 Chronic obstructive pulmonary disease, unspecified: Secondary | ICD-10-CM | POA: Diagnosis not present

## 2022-03-31 DIAGNOSIS — G894 Chronic pain syndrome: Secondary | ICD-10-CM | POA: Diagnosis not present

## 2022-03-31 DIAGNOSIS — M25561 Pain in right knee: Secondary | ICD-10-CM | POA: Diagnosis not present

## 2022-03-31 DIAGNOSIS — M25562 Pain in left knee: Secondary | ICD-10-CM | POA: Diagnosis not present

## 2022-03-31 DIAGNOSIS — G609 Hereditary and idiopathic neuropathy, unspecified: Secondary | ICD-10-CM | POA: Diagnosis not present

## 2022-03-31 DIAGNOSIS — M545 Low back pain, unspecified: Secondary | ICD-10-CM | POA: Diagnosis not present

## 2022-04-01 ENCOUNTER — Institutional Professional Consult (permissible substitution): Payer: Medicare HMO | Admitting: Internal Medicine

## 2022-04-07 ENCOUNTER — Other Ambulatory Visit: Payer: Self-pay | Admitting: Family Medicine

## 2022-04-08 ENCOUNTER — Ambulatory Visit (INDEPENDENT_AMBULATORY_CARE_PROVIDER_SITE_OTHER): Payer: Medicare HMO | Admitting: Family Medicine

## 2022-04-08 DIAGNOSIS — J189 Pneumonia, unspecified organism: Secondary | ICD-10-CM

## 2022-04-08 MED ORDER — HYDROXYZINE PAMOATE 25 MG PO CAPS: 25.0000 mg | ORAL_CAPSULE | Freq: Three times a day (TID) | ORAL | 5 refills | Status: DC | PRN

## 2022-04-08 NOTE — Progress Notes (Signed)
Subjective:    Patient ID: Brittany Conrad, female    DOB: 28-Jan-1951, 71 y.o.   MRN: [id number]   HPI: Brittany Conrad is a 71 y.o. female presenting for reheck of pneumonia. Insisted she couldn't come to the office. Coughing less. Dyspnea back to baseline      02/16/2022    1:06 PM 12/10/2021    1:15 PM 11/23/2021   12:12 PM 11/02/2021   10:40 AM 07/27/2021   11:49 AM  Depression screen PHQ 2/9  Decreased Interest 1 1 1 3 3   Down, Depressed, Hopeless 1  1 2 2   PHQ - 2 Score 2 1 2 5 5   Altered sleeping 3 0 1 3 3   Tired, decreased energy 1 1 1 2 2   Change in appetite 2 0 0 1 2  Feeling bad or failure about yourself  1 2 1 2 2   Trouble concentrating 0 2 1 2 2   Moving slowly or fidgety/restless 0 0 0 1 1  Suicidal thoughts 0 0 0 0 0  PHQ-9 Score 9 6 6 16 17   Difficult doing work/chores Somewhat difficult Not difficult at all Somewhat difficult Very difficult Not difficult at all     Relevant past medical, surgical, family and social history reviewed and updated as indicated.  Interim medical history since our last visit reviewed. Allergies and medications reviewed and updated.  ROS:  Review of Systems  Constitutional: Negative.   HENT: Negative.    Eyes:  Negative for visual disturbance.  Respiratory:  Negative for shortness of breath.   Cardiovascular:  Negative for chest pain.  Gastrointestinal:  Negative for abdominal pain.  Musculoskeletal:  Negative for arthralgias.     Social History   Tobacco Use  Smoking Status Former   Packs/day: 2.00   Years: 35.00   Total pack years: 70.00   Types: Cigarettes   Quit date: 07/05/2016   Years since quitting: 5.7  Smokeless Tobacco Never       Objective:     Wt Readings from Last 3 Encounters:  12/10/21 155 lb (70.3 kg)  11/23/21 150 lb (68 kg)  09/02/21 150 lb 8 oz (68.3 kg)     Exam deferred.  Phone visit performed.   Assessment & Plan:   1. Pneumonia of both lower lobes due to infectious organism     Meds  ordered this encounter  Medications   hydrOXYzine (VISTARIL) 25 MG capsule    Sig: Take 1 capsule (25 mg total) by mouth every 8 (eight) hours as needed (vertigo).    Dispense:  30 capsule    Refill:  5    No orders of the defined types were placed in this encounter.     Diagnoses and all orders for this visit:  Pneumonia of both lower lobes due to infectious organism  Other orders -     hydrOXYzine (VISTARIL) 25 MG capsule; Take 1 capsule (25 mg total) by mouth every 8 (eight) hours as needed (vertigo).    Virtual Visit via telephone Note  I discussed the limitations, risks, security and privacy concerns of performing an evaluation and management service by telephone and the availability of in person appointments. The patient was identified with two identifiers. Pt.expressed understanding and agreed to proceed. Pt. Is at home. Dr. Livia Snellen is in his office.  Follow Up Instructions:   I discussed the assessment and treatment plan with the patient. The patient was provided an opportunity to ask questions and all were answered. The patient  agreed with the plan and demonstrated an understanding of the instructions.   The patient was advised to call back or seek an in-person evaluation if the symptoms worsen or if the condition fails to improve as anticipated.   Total minutes including chart review and phone contact time: 12   Follow up plan: Return if symptoms worsen or fail to improve.  [name], MD [place]

## 2022-04-11 ENCOUNTER — Encounter: Payer: Self-pay | Admitting: Family Medicine

## 2022-04-14 DIAGNOSIS — J449 Chronic obstructive pulmonary disease, unspecified: Secondary | ICD-10-CM | POA: Diagnosis not present

## 2022-04-14 DIAGNOSIS — F319 Bipolar disorder, unspecified: Secondary | ICD-10-CM | POA: Diagnosis not present

## 2022-04-14 DIAGNOSIS — G8929 Other chronic pain: Secondary | ICD-10-CM | POA: Diagnosis not present

## 2022-04-14 DIAGNOSIS — G894 Chronic pain syndrome: Secondary | ICD-10-CM | POA: Diagnosis not present

## 2022-04-14 DIAGNOSIS — M25562 Pain in left knee: Secondary | ICD-10-CM | POA: Diagnosis not present

## 2022-04-14 DIAGNOSIS — G609 Hereditary and idiopathic neuropathy, unspecified: Secondary | ICD-10-CM | POA: Diagnosis not present

## 2022-04-14 DIAGNOSIS — M25561 Pain in right knee: Secondary | ICD-10-CM | POA: Diagnosis not present

## 2022-04-14 DIAGNOSIS — M545 Low back pain, unspecified: Secondary | ICD-10-CM | POA: Diagnosis not present

## 2022-04-16 ENCOUNTER — Other Ambulatory Visit: Payer: Self-pay | Admitting: Family Medicine

## 2022-04-26 ENCOUNTER — Ambulatory Visit (INDEPENDENT_AMBULATORY_CARE_PROVIDER_SITE_OTHER): Payer: Medicare HMO

## 2022-04-26 ENCOUNTER — Encounter: Payer: Self-pay | Admitting: Family Medicine

## 2022-04-26 ENCOUNTER — Ambulatory Visit (INDEPENDENT_AMBULATORY_CARE_PROVIDER_SITE_OTHER): Payer: Medicare HMO | Admitting: Family Medicine

## 2022-04-26 VITALS — BP 130/71 | HR 90 | Temp 97.7°F | Ht 62.0 in | Wt 154.1 lb

## 2022-04-26 DIAGNOSIS — R051 Acute cough: Secondary | ICD-10-CM

## 2022-04-26 DIAGNOSIS — J189 Pneumonia, unspecified organism: Secondary | ICD-10-CM | POA: Diagnosis not present

## 2022-04-26 DIAGNOSIS — J441 Chronic obstructive pulmonary disease with (acute) exacerbation: Secondary | ICD-10-CM

## 2022-04-26 DIAGNOSIS — R059 Cough, unspecified: Secondary | ICD-10-CM | POA: Diagnosis not present

## 2022-04-26 DIAGNOSIS — J449 Chronic obstructive pulmonary disease, unspecified: Secondary | ICD-10-CM | POA: Diagnosis not present

## 2022-04-26 MED ORDER — PREDNISONE 10 MG (21) PO TBPK: ORAL_TABLET | ORAL | 0 refills | Status: DC

## 2022-04-26 MED ORDER — DOXYCYCLINE HYCLATE 100 MG PO TABS: 100.0000 mg | ORAL_TABLET | Freq: Two times a day (BID) | ORAL | 0 refills | Status: AC

## 2022-04-26 MED ORDER — GUAIFENESIN ER 600 MG PO TB12: 1200.0000 mg | ORAL_TABLET | Freq: Two times a day (BID) | ORAL | 0 refills | Status: DC | PRN

## 2022-04-26 NOTE — Progress Notes (Signed)
Established Patient Office Visit  Subjective   Patient ID: Brittany Conrad, female    DOB: 01-09-1951  Age: 71 y.o. MRN: [id number]  Chief Complaint  Patient presents with   Cough    HPI She reports increased cough, wheezing, and shortness of breath for about 3 days. The cough is productive with yellow sputum. She also reports a sore throat and nasal congestion. She had a mild headache but this has been resolved. She is on 3L of 02 at home. She did not wear this with her today. Denies fever, chest pain, nausea, vomiting, or diarrhea. She has been using her albuterol 2-3x a day and has been taking breztri BID. She has also been doing breathing treatments 2-3x a day. She has been referred to pulmonology but her appt is not for another month. She was recently in the hospital for bilateral pneumonia. She has not had a repeat CXR since then. She was recently treated for a COPD exacerbation with Moxifloxacin.     ROS As per HPI.   Objective:     BP 130/71   Pulse 90   Temp 97.7 F (36.5 C) (Temporal)   Ht 5\' 2"  (1.575 m)   Wt 154 lb 2 oz (69.9 kg)   SpO2 95%   BMI 28.19 kg/m    Physical Exam Vitals and nursing note reviewed.  Constitutional:      General: She is not in acute distress.    Appearance: She is not ill-appearing, toxic-appearing or diaphoretic.  Cardiovascular:     Rate and Rhythm: Normal rate and regular rhythm.     Heart sounds: Normal heart sounds. No murmur heard. Pulmonary:     Effort: Pulmonary effort is normal. No tachypnea or respiratory distress.     Breath sounds: Examination of the right-lower field reveals wheezing and rhonchi. Examination of the left-lower field reveals wheezing and rhonchi. Wheezing and rhonchi present. No rales.  Musculoskeletal:     Right lower leg: No edema.     Left lower leg: No edema.  Skin:    General: Skin is warm and dry.  Neurological:     General: No focal deficit present.     Mental Status: She is alert and oriented  to person, place, and time.  Psychiatric:        Mood and Affect: Mood normal.        Behavior: Behavior normal.      No results found for any visits on 04/26/22.    The ASCVD Risk score (Arnett DK, et al., 2019) failed to calculate for the following reasons:   Cannot find a previous HDL lab   Cannot find a previous total cholesterol lab    Assessment & Plan:   Taylor was seen today for cough.  Diagnoses and all orders for this visit:  Acute cough Covid test pending, quarantine until results.  -     Novel Coronavirus, NAA ([facility]) -     DG Chest 2 View; Future  Chronic obstructive pulmonary disease with acute exacerbation ([place]) CXR today given recent pneumonia. Radiology report pending. Doxycyline and prednisone as below. Mucinex prn.  Continue maintenance medications.  -     DG Chest 2 View; Future -     doxycycline (VIBRA-TABS) 100 MG tablet; Take 1 tablet (100 mg total) by mouth 2 (two) times daily for 7 days. 1 po bid -     predniSONE (STERAPRED UNI-PAK 21 TAB) 10 MG (21) TBPK tablet; Use as directed on back of  pill pack -     guaiFENesin (MUCINEX) 600 MG 12 hr tablet; Take 2 tablets (1,200 mg total) by mouth 2 (two) times daily as needed for cough or to loosen phlegm.  Pneumonia of both lower lobes due to infectious organism Repeat CXR today.  -     DG Chest 2 View; Future   Return if symptoms worsen or fail to improve.   The patient indicates understanding of these issues and agrees with the plan.  Gabriel Earing, FNP

## 2022-04-27 LAB — NOVEL CORONAVIRUS, NAA: SARS-CoV-2, NAA: NOT DETECTED

## 2022-04-28 DIAGNOSIS — J449 Chronic obstructive pulmonary disease, unspecified: Secondary | ICD-10-CM | POA: Diagnosis not present

## 2022-04-28 DIAGNOSIS — G8929 Other chronic pain: Secondary | ICD-10-CM | POA: Diagnosis not present

## 2022-04-28 DIAGNOSIS — F319 Bipolar disorder, unspecified: Secondary | ICD-10-CM | POA: Diagnosis not present

## 2022-04-28 DIAGNOSIS — M25562 Pain in left knee: Secondary | ICD-10-CM | POA: Diagnosis not present

## 2022-04-28 DIAGNOSIS — M545 Low back pain, unspecified: Secondary | ICD-10-CM | POA: Diagnosis not present

## 2022-04-28 DIAGNOSIS — M25561 Pain in right knee: Secondary | ICD-10-CM | POA: Diagnosis not present

## 2022-04-28 DIAGNOSIS — G609 Hereditary and idiopathic neuropathy, unspecified: Secondary | ICD-10-CM | POA: Diagnosis not present

## 2022-04-28 DIAGNOSIS — G894 Chronic pain syndrome: Secondary | ICD-10-CM | POA: Diagnosis not present

## 2022-05-04 ENCOUNTER — Other Ambulatory Visit: Payer: Self-pay | Admitting: Family Medicine

## 2022-05-17 ENCOUNTER — Other Ambulatory Visit: Payer: Self-pay | Admitting: Family Medicine

## 2022-05-17 ENCOUNTER — Ambulatory Visit: Payer: Medicare HMO | Admitting: Family Medicine

## 2022-05-25 ENCOUNTER — Ambulatory Visit: Payer: Medicare HMO | Admitting: Internal Medicine

## 2022-05-25 ENCOUNTER — Encounter: Payer: Self-pay | Admitting: Internal Medicine

## 2022-05-25 VITALS — BP 130/76 | HR 94 | Temp 98.2°F | Ht 62.0 in | Wt 156.2 lb

## 2022-05-25 DIAGNOSIS — R0609 Other forms of dyspnea: Secondary | ICD-10-CM

## 2022-05-25 NOTE — Progress Notes (Signed)
Brittany Conrad, female    DOB: [birth year]    MRN: [id number]   Brief patient profile:  89  yowf from [place] quit smoking  2018 @ wt 150  referred to pulmonary clinic in [facility]  05/25/2022 by Cletus Gash  for ? ILD with no symptoms at all until covid x 2 with first 2020 Jan 2023.    History of Present Illness  05/25/2022  Pulmonary/ 1st office eval/ Nichol Ator / [place] Office  Chief Complaint  Patient presents with   Consult    Consult for COPD following admission at [facility]  Patient came home on O2 uses prn and at night  Would like high dose flu shot today   Dyspnea:  mainly limited by balance not breathing but says needs saba p goes to bathroom  Cough: mucoid, worse in am  Sleep: flat bed one and half pillows  SABA use: not sure which is which / has nebulizer  02: rec 3lpm 24/7   No obvious day to day or daytime pattern/variability or assoc excess/ purulent sputum or mucus plugs or hemoptysis or cp or chest tightness, subjective wheeze or overt sinus or hb symptoms.   Sleeping  without nocturnal  or early am exacerbation  of respiratory  c/o's or need for noct saba. Also denies any obvious fluctuation of symptoms with weather or environmental changes or other aggravating or alleviating factors except as outlined above   No unusual exposure hx or h/o childhood pna/ asthma or knowledge of premature birth.  Current Allergies, Complete Past Medical History, Past Surgical History, Family History, and Social History were reviewed in [employer] record.  ROS  The following are not active complaints unless bolded Hoarseness, sore throat, dysphagia, dental problems, itching, sneezing,  nasal congestion or discharge of excess mucus or purulent secretions, ear ache,   fever, chills, sweats, unintended wt loss or wt gain, classically pleuritic or exertional cp,  orthopnea pnd or arm/hand swelling  or leg swelling, presyncope, palpitations, abdominal pain,  anorexia, nausea, vomiting, diarrhea  or change in bowel habits or change in bladder habits, change in stools or change in urine, dysuria, hematuria,  rash, arthralgias, visual complaints, headache, numbness, weakness or ataxia or problems with walking/balance or coordination,  change in mood or  memory.             Past Medical History:  Diagnosis Date   Asthma    Bipolar 1 disorder ([place])    COPD (chronic obstructive pulmonary disease) (HCC)    Emphysema lung (HCC)    Emphysema of lung (HCC)    Neuropathy    Scoliosis     Outpatient Medications Prior to Visit  Medication Sig Dispense Refill   albuterol (PROVENTIL) (2.5 MG/3ML) 0.083% nebulizer solution INHALE 3 ML BY NEBULIZATION EVERY 6 HOURS AS NEEDED FOR WHEEZING OR SHORTNESS OF BREATH 150 mL 5   albuterol (VENTOLIN HFA) 108 (90 Base) MCG/ACT inhaler INHALE 1-2 PUFFS BY MOUTH EVERY 6 HOURS AS NEEDED FOR WHEEZE OR SHORTNESS OF BREATH 18 each 2   amitriptyline (ELAVIL) 100 MG tablet TAKE TWO TABLETS BY MOUTH AT BEDTIME 180 tablet 0   amLODipine (NORVASC) 10 MG tablet TAKE 1 TABLET BY MOUTH EVERY DAY 90 tablet 0   ARIPiprazole (ABILIFY) 30 MG tablet TAKE ONE TABLET ONCE DAILY 30 tablet 0   Budeson-Glycopyrrol-Formoterol (BREZTRI AEROSPHERE) 160-9-4.8 MCG/ACT AERO Inhale 2 puffs into the lungs 2 (two) times daily. 10.7 g 11   busPIRone (BUSPAR) 10  MG tablet TAKE ONE TABLET THREE TIMES DAILY 270 tablet 0   clonazePAM (KLONOPIN) 0.5 MG tablet Take 0.5 mg by mouth daily as needed.     [employer] D3 25 MCG (1000 UT) capsule TAKE 1 CAPSULE (1,000 UNITS TOTAL) BY MOUTH DAILY. 120 capsule 11   DULoxetine (CYMBALTA) 60 MG capsule TAKE ONE CAPSULE ONCE DAILY 30 capsule 5   ferrous sulfate 325 (65 FE) MG tablet Take 325 mg by mouth every morning.     furosemide (LASIX) 40 MG tablet TAKE ONE TABLET TWICE DAILY 180 tablet 0   gabapentin (NEURONTIN) 400 MG capsule Take 400 mg by mouth 3 (three) times daily.     guaiFENesin (MUCINEX) 600 MG 12 hr tablet  Take 2 tablets (1,200 mg total) by mouth 2 (two) times daily as needed for cough or to loosen phlegm. 30 tablet 0   HYDROcodone-acetaminophen (NORCO) 7.5-325 MG tablet Take 1 tablet by mouth every 6 (six) hours as needed for moderate pain.     hydrOXYzine (VISTARIL) 25 MG capsule Take 1 capsule (25 mg total) by mouth every 8 (eight) hours as needed (vertigo). 30 capsule 5   ibuprofen (ADVIL,MOTRIN) 800 MG tablet Take 800 mg by mouth 3 (three) times daily as needed.  0   ipratropium (ATROVENT) 0.02 % nebulizer solution Take 2.5 mLs (0.5 mg total) by nebulization 4 (four) times daily. 75 mL 12   KLOR-CON M20 20 MEQ tablet TAKE 2 TABLETS (40 MEQ TOTAL) BY MOUTH 3 (THREE) TIMES DAILY. FOR POTASSIUM REPLACEMENT/ SUPPLEMENT 540 tablet 0   levothyroxine (SYNTHROID) 100 MCG tablet Take 1 tablet (100 mcg total) by mouth daily. 90 tablet 0   OLANZapine (ZYPREXA) 5 MG tablet TAKE ONE TABLET AT BEDTIME 30 tablet 2   pantoprazole (PROTONIX) 40 MG tablet TAKE ONE TABLET ONCE DAILY 30 tablet 4   predniSONE (STERAPRED UNI-PAK 21 TAB) 10 MG (21) TBPK tablet Use as directed on back of pill pack 21 tablet 0   sucralfate (CARAFATE) 1 g tablet      No facility-administered medications prior to visit.     Objective:     BP 130/76   Pulse 94   Temp 98.2 F (36.8 C)   Ht 5\' 2"  (1.575 m)   Wt 156 lb 3.2 oz (70.9 kg)   SpO2 97% Comment: ra  BMI 28.57 kg/m   SpO2: 97 % (ra)  Amb wf nad    HEENT : Oropharynx  clear   Nasal turbinates nl    NECK :  without  apparent JVD/ palpable Nodes/TM    LUNGS: no acc muscle use,  Min barrel  contour chest wall with bilateral  slightly decreased bs s audible wheeze and  without cough on insp or exp maneuvers and min  Hyperresonant  to  percussion bilaterally    CV:  RRR  no s3 or murmur or increase in P2, and no edema   ABD:  soft and nontender with pos end  insp Hoover's  in the supine position.  No bruits or organomegaly appreciated   MS:  Nl gait/ ext warm  without deformities Or obvious joint restrictions  calf tenderness, cyanosis or clubbing     SKIN: warm and dry without lesions    NEURO:  alert, approp, nl sensorium with  no motor or cerebellar deficits apparent.          I personally reviewed images and agree with radiology impression as follows:  CXR:   pa and lateral 04/26/22 Chronic interstitial  lung markings with pleural and parenchymal scarring. No sign of acute consolidation or collapse. Certainly, low level inflammatory change could be hidden within the chronic markings.    CTa 02/23/22 Patchy areas of ground-glass and septal thickening throughout the chest with peripheral distribution. Asymmetric edema is considered though the patchy nature of the above findings raise the question of COVID-19 or similar viral process. A background of interstitial changes and chronic interstitial lung disease is also considered versus post inflammatory fibrosis.        Assessment   DOE (dyspnea on exertion) Quit smoking 2018 s limiting doe Onset p covid x 2 last episode Jan 2023  - CTa 02/23/22 c/w post covid can't r/o UIP  - 05/25/2022  After extensive coaching inhaler device,  effectiveness =    50% effective so ok to use prior to ex to see if helps  - 05/25/2022   Walked on RA  x  3  lap(s) =  approx 450  ft  @ moderate pace, stopped due to end of study with lowest 02 sats 95% and no sob - HRCT chest 05/25/2022 >>>  - f/u with PFTs in [place] in mid Jan 2024 >>>  Symptoms are disproportionate to objective findings and not clear to what extent this is actually a pulmonary  problem but pt does appear to have difficult to sort out respiratory symptoms of unknown origin for which  DDX  = almost all start with A and  include Adherence, Ace Inhibitors, Acid Reflux, Active Sinus Disease, Alpha 1 Antitripsin deficiency, Anxiety/depression / deconditioning  masquerading as Airways dz,  ABPA,  Allergy(esp in young), Aspiration (esp in elderly),  Adverse effects of meds,  Active smoking or Vaping, A bunch of PE's/clot burden (a few small clots can't cause this syndrome unless there is already severe underlying pulm or vascular dz with poor reserve),  Anemia or thyroid disorder, plus two Bs  = Bronchiectasis and Beta blocker use..and one C= CHF    No clear etiology for present symptoms so will start with PFTs and HRCT   Re SABA :  I spent extra time with pt today reviewing appropriate use of albuterol for prn use on exertion with the following points: 1) saba is for relief of sob that does not improve by walking a slower pace or resting but rather if the pt does not improve after trying this first. 2) If the pt is convinced, as many are, that saba helps recover from activity faster then it's easy to tell if this is the case by re-challenging : ie stop, take the inhaler, then p 5 minutes try the exact same activity (intensity of workload) that just caused the symptoms and see if they are substantially diminished or not after saba 3) if there is an activity that reproducibly causes the symptoms, try the saba 15 min before the activity on alternate days   If in fact the saba really does help, then fine to continue to use it prn but advised may need to look closer at the maintenance regimen (right now nothing) being used to achieve better control of airways disease with exertion.    Each maintenance medication was reviewed in detail including emphasizing most importantly the difference between maintenance and prns and under what circumstances the prns are to be triggered using an action plan format where appropriate.  Total time for H and P, chart review, counseling, reviewing hfa device(s) , directly observing portions of ambulatory 02 saturation study/ and generating customized AVS  unique to this office visit / same day charting   60 min new pt evaluation                     Christinia Gully, MD 05/25/2022

## 2022-05-25 NOTE — Patient Instructions (Addendum)
Only use your albuterol as a rescue medication to be used if you can't catch your breath by resting or doing a relaxed purse lip breathing pattern.  - The less you use it, the better it will work when you need it. - Ok to use up to 2 puffs  every 4 hours if you must but call for immediate appointment if use goes up over your usual need - Don't leave home without it !!  (think of it like the spare tire for your car)   Also  Ok to try albuterol 15 min before an activity (on alternating days)  that you know would usually make you short of breath and see if it makes any difference and if makes none then don't take albuterol after activity unless you can't catch your breath as this means it's the resting that helps, not the albuterol.      Make sure you check your oxygen saturation  AT  your highest level of activity (not after you stop)   to be sure it stays over 90% and adjust  02 flow upward to maintain this level if needed but remember to turn it back to previous settings when you stop (to conserve your supply).   My office will be contacting you by phone for referral to high resolution CT chest in meantime at [facility] -  if you don't hear back from my office within one week please call us back or notify us thru MyChart and we'll address it right away. high resolution CT chest in meantime at [facility]  Please schedule a follow up office visit in 8  weeks, call sooner if needed to [place] with  PFTs on the day you come to [facility]

## 2022-05-26 NOTE — Assessment & Plan Note (Addendum)
Quit smoking 2018 s limiting doe Onset p covid x 2 last episode Jan 2023  - CTa 02/23/22 c/w post covid can't r/o UIP  - 05/25/2022  After extensive coaching inhaler device,  effectiveness =    50% effective so ok to use prior to ex to see if helps  - 05/25/2022   Walked on RA  x  3  lap(s) =  approx 450  ft  @ moderate pace, stopped due to end of study with lowest 02 sats 95% and no sob - HRCT chest 05/25/2022 >>>  - f/u with PFTs in [place] in mid Jan 2024 >>>  Symptoms are disproportionate to objective findings and not clear to what extent this is actually a pulmonary  problem but pt does appear to have difficult to sort out respiratory symptoms of unknown origin for which  DDX  = almost all start with A and  include Adherence, Ace Inhibitors, Acid Reflux, Active Sinus Disease, Alpha 1 Antitripsin deficiency, Anxiety/depression / deconditioning  masquerading as Airways dz,  ABPA,  Allergy(esp in young), Aspiration (esp in elderly), Adverse effects of meds,  Active smoking or Vaping, A bunch of PE's/clot burden (a few small clots can't cause this syndrome unless there is already severe underlying pulm or vascular dz with poor reserve),  Anemia or thyroid disorder, plus two Bs  = Bronchiectasis and Beta blocker use..and one C= CHF    No clear etiology for present symptoms so will start with PFTs and HRCT   Re SABA :  I spent extra time with pt today reviewing appropriate use of albuterol for prn use on exertion with the following points: 1) saba is for relief of sob that does not improve by walking a slower pace or resting but rather if the pt does not improve after trying this first. 2) If the pt is convinced, as many are, that saba helps recover from activity faster then it's easy to tell if this is the case by re-challenging : ie stop, take the inhaler, then p 5 minutes try the exact same activity (intensity of workload) that just caused the symptoms and see if they are substantially diminished or not  after saba 3) if there is an activity that reproducibly causes the symptoms, try the saba 15 min before the activity on alternate days   If in fact the saba really does help, then fine to continue to use it prn but advised may need to look closer at the maintenance regimen (right now nothing) being used to achieve better control of airways disease with exertion.    Each maintenance medication was reviewed in detail including emphasizing most importantly the difference between maintenance and prns and under what circumstances the prns are to be triggered using an action plan format where appropriate.  Total time for H and P, chart review, counseling, reviewing hfa device(s) , directly observing portions of ambulatory 02 saturation study/ and generating customized AVS unique to this office visit / same day charting   60 min new pt evaluation

## 2022-05-30 ENCOUNTER — Encounter: Payer: Self-pay | Admitting: Internal Medicine

## 2022-05-31 ENCOUNTER — Other Ambulatory Visit: Payer: Self-pay | Admitting: Family Medicine

## 2022-05-31 DIAGNOSIS — F319 Bipolar disorder, unspecified: Secondary | ICD-10-CM | POA: Diagnosis not present

## 2022-05-31 DIAGNOSIS — G8929 Other chronic pain: Secondary | ICD-10-CM | POA: Diagnosis not present

## 2022-05-31 DIAGNOSIS — G609 Hereditary and idiopathic neuropathy, unspecified: Secondary | ICD-10-CM | POA: Diagnosis not present

## 2022-05-31 DIAGNOSIS — G894 Chronic pain syndrome: Secondary | ICD-10-CM | POA: Diagnosis not present

## 2022-05-31 DIAGNOSIS — M25562 Pain in left knee: Secondary | ICD-10-CM | POA: Diagnosis not present

## 2022-05-31 DIAGNOSIS — M25561 Pain in right knee: Secondary | ICD-10-CM | POA: Diagnosis not present

## 2022-05-31 DIAGNOSIS — M545 Low back pain, unspecified: Secondary | ICD-10-CM | POA: Diagnosis not present

## 2022-05-31 DIAGNOSIS — J449 Chronic obstructive pulmonary disease, unspecified: Secondary | ICD-10-CM | POA: Diagnosis not present

## 2022-06-07 NOTE — Patient Instructions (Signed)
Our records indicate that you are due for your annual mammogram/breast imaging. While there is no way to prevent breast cancer, early detection provides the best opportunity for curing it. For women over the age of 40, the American Cancer Society recommends a yearly clinical breast exam and a yearly mammogram. These practices have saved thousands of lives. We need your help to ensure that you are receiving optimal medical care. Please call the imaging location that has done you previous mammograms. Please remember to list us as your primary care. This helps make sure we receive a report and can update your chart.  Below is the contact information for several local breast imaging centers. You may call the location that works best for you, and they will be happy to assistance in making you an appointment. You do not need an order for a regular screening mammogram. However, if you are having any problems or concerns with you breast area, please let your primary care provider know, and appropriate orders will be placed. Please let our office know if you have any questions or concerns. Or if you need information for another imaging center not on this list or outside of the area. We are commented to working with you on your health care journey.   The mobile unit/bus ([facility]) - they come twice a month to our location.  These appointments can be made through our office or by call The Breast Center  [facility]  [address] [place], [place] [address] Phone [phone]  [facility]  [address]  [place], [place] [address] [phone]  [facility]  [address] [place], [place] [address] [phone]  [facility] [place]  [address] [place] [address] [phone]  [facility]  [address], [address] [place] [address] [phone]  [facility] [place]  [address] [place], [place] [address] [phone]  [facility] [address] [place], [place] [address] [phone]  [facility] [address]  [address] [place], [place] [address] [phone]  [facility] [address] [place] [address] [phone]     

## 2022-06-08 ENCOUNTER — Ambulatory Visit (INDEPENDENT_AMBULATORY_CARE_PROVIDER_SITE_OTHER): Payer: Medicare HMO | Admitting: Family Medicine

## 2022-06-08 DIAGNOSIS — E039 Hypothyroidism, unspecified: Secondary | ICD-10-CM | POA: Diagnosis not present

## 2022-06-08 DIAGNOSIS — I10 Essential (primary) hypertension: Secondary | ICD-10-CM | POA: Diagnosis not present

## 2022-06-08 DIAGNOSIS — F411 Generalized anxiety disorder: Secondary | ICD-10-CM | POA: Diagnosis not present

## 2022-06-08 DIAGNOSIS — F330 Major depressive disorder, recurrent, mild: Secondary | ICD-10-CM

## 2022-06-08 MED ORDER — CLONAZEPAM 0.5 MG PO TABS: 0.5000 mg | ORAL_TABLET | Freq: Every evening | ORAL | 0 refills | Status: DC | PRN

## 2022-06-08 MED ORDER — DULOXETINE HCL 60 MG PO CPEP: 60.0000 mg | ORAL_CAPSULE | Freq: Two times a day (BID) | ORAL | 2 refills | Status: DC

## 2022-06-08 NOTE — Progress Notes (Signed)
Subjective:    Patient ID: Brittany Conrad, female    DOB: 08/21/1950, 71 y.o.   MRN: [id number]   HPI: Brittany Conrad is a 71 y.o. female presenting for can't stand to be around people. Wants to slap them. Not feeling like herself. Weather change has affected her. Doesn't feel good. Feeling depressed.  Review of previous PHQ shows symptoms persist. Numbers unchanged today from most recent previous PHQ   follow-up on  thyroid. The patient has a history of hypothyroidism for many years. It has been stable recently. Pt. denies any change in  voice, loss of hair, heat or cold intolerance. Energy level has been adequate to good. Patient denies constipation and diarrhea. No myxedema. Medication is as noted below. Verified that pt is taking it daily on an empty stomach. Well tolerated.       04/26/2022    1:43 PM 02/16/2022    1:06 PM 12/10/2021    1:15 PM 11/23/2021   12:12 PM 11/02/2021   10:40 AM  Depression screen PHQ 2/9  Decreased Interest _0 Down, Depressed, Hopeless 0 _1 PHQ - 2 Score _2 Altered sleeping 2 3 0 1 3  Tired, decreased energy _3 Change in appetite 3 2 0 0 1  Feeling bad or failure about yourself  _4 Trouble concentrating 2 0 _5 Moving slowly or fidgety/restless 1 0 0 0 1  Suicidal thoughts 0 0 0 0 0  PHQ-9 Score _6 Difficult doing work/chores Somewhat difficult Somewhat difficult Not difficult at all Somewhat difficult Very difficult     Relevant past medical, surgical, family and social history reviewed and updated as indicated.  Interim medical history since our last visit reviewed. Allergies and medications reviewed and updated.  ROS:  Review of Systems  Constitutional: Negative.   HENT: Negative.    Eyes:  Negative for visual disturbance.  Respiratory:  Negative for shortness of breath.   Cardiovascular:  Negative for chest pain.  Gastrointestinal:  Negative for abdominal pain.  Musculoskeletal:  Negative  for arthralgias.  Psychiatric/Behavioral:  Positive for dysphoric mood.      Social History   Tobacco Use  Smoking Status Former   Packs/day: 2.00   Years: 35.00   Total pack years: 70.00   Types: Cigarettes   Quit date: 07/05/2016   Years since quitting: 5.9  Smokeless Tobacco Never       Objective:     Wt Readings from Last 3 Encounters:  05/25/22 156 lb 3.2 oz (70.9 kg)  04/26/22 154 lb 2 oz (69.9 kg)  12/10/21 155 lb (70.3 kg)     Exam deferred. Pt. Harboring due to COVID 19. Phone visit performed.   Assessment & Plan:   1. Hypothyroidism, unspecified type   2. Essential hypertension   3. GAD (generalized anxiety disorder)   4. MDD (major depressive disorder), recurrent episode, mild ([place])     Meds ordered this encounter  Medications   DULoxetine (CYMBALTA) 60 MG capsule    Sig: Take 1 capsule (60 mg total) by mouth 2 (two) times daily.    Dispense:  60 capsule    Refill:  2   clonazePAM (KLONOPIN) 0.5 MG tablet    Sig: Take 1 tablet (0.5 mg total) by mouth at bedtime as needed for anxiety.    Dispense:  30  tablet    Refill:  0    Orders Placed This Encounter  Procedures   TSH + free T4   CBC with Differential/Platelet   CMP14+EGFR    Order Specific Question:   Has the patient fasted?    Answer:   Yes    Order Specific Question:   Release to patient    Answer:   Immediate      Diagnoses and all orders for this visit:  Hypothyroidism, unspecified type  Essential hypertension  GAD (generalized anxiety disorder)  MDD (major depressive disorder), recurrent episode, mild (HCC) -     TSH + free T4 -     CBC with Differential/Platelet -     CMP14+EGFR  Other orders -     DULoxetine (CYMBALTA) 60 MG capsule; Take 1 capsule (60 mg total) by mouth 2 (two) times daily. -     clonazePAM (KLONOPIN) 0.5 MG tablet; Take 1 tablet (0.5 mg total) by mouth at bedtime as needed for anxiety.    Virtual Visit via telephone Note  I discussed the  limitations, risks, security and privacy concerns of performing an evaluation and management service by telephone and the availability of in person appointments. The patient was identified with two identifiers. Pt.expressed understanding and agreed to proceed. Pt. Is at home. Dr. Livia Snellen is in his office.  Follow Up Instructions:   I discussed the assessment and treatment plan with the patient. The patient was provided an opportunity to ask questions and all were answered. The patient agreed with the plan and demonstrated an understanding of the instructions.   The patient was advised to call back or seek an in-person evaluation if the symptoms worsen or if the condition fails to improve as anticipated.   Total minutes including chart review and phone contact time: 22   Follow up plan: Return in about 3 months (around 09/07/2022).  Brittany Fraise, MD [place]

## 2022-06-09 ENCOUNTER — Encounter: Payer: Self-pay | Admitting: Family Medicine

## 2022-06-09 ENCOUNTER — Other Ambulatory Visit (HOSPITAL_COMMUNITY): Payer: Self-pay

## 2022-06-09 ENCOUNTER — Telehealth: Payer: Self-pay | Admitting: Family Medicine

## 2022-06-09 NOTE — Telephone Encounter (Signed)
Patient calling to let us know that clonazePAM (KLONOPIN) 0.5 MG tablet needs PA

## 2022-06-10 ENCOUNTER — Telehealth: Payer: Self-pay | Admitting: Family Medicine

## 2022-06-10 ENCOUNTER — Other Ambulatory Visit: Payer: Self-pay | Admitting: Family Medicine

## 2022-06-10 ENCOUNTER — Other Ambulatory Visit (HOSPITAL_COMMUNITY): Payer: Self-pay

## 2022-06-10 ENCOUNTER — Telehealth: Payer: Self-pay

## 2022-06-10 NOTE — Telephone Encounter (Signed)
Patient Advocate Encounter  Prior Authorization for clonazePAM 0.5MG  tablets has been approved.    PA# [id number] Key: [id number]  Effective dates: 07/05/22 through 07/05/2023  Paid claim at pharmacy  Approval letter attached to charts

## 2022-06-10 NOTE — Telephone Encounter (Signed)
Pharmacy Patient Advocate Encounter   Received notification from [facility] that prior authorization for Clonazepam 0.5mg  is required/requested.  PA submitted on 06/10/22 to [facility] via CoverMyMeds  Key [id number]  Status is pending

## 2022-06-10 NOTE — Telephone Encounter (Signed)
Per cover my meds, there is a drug interaction between Clonazepam and Hydrodocone, Olanzapine, and Duloxetine. Please advise

## 2022-06-10 NOTE — Telephone Encounter (Signed)
Patient is needing prior authorization on clonazepam

## 2022-06-18 IMAGING — DX DG CHEST 2V
2 series · 2 of 2 positions shown · non-contrast
Comparison: Chest radiograph dated 02/28/2021.

CLINICAL DATA: Shortness of breath.

EXAM:
CHEST - 2 VIEW

[chest pa]
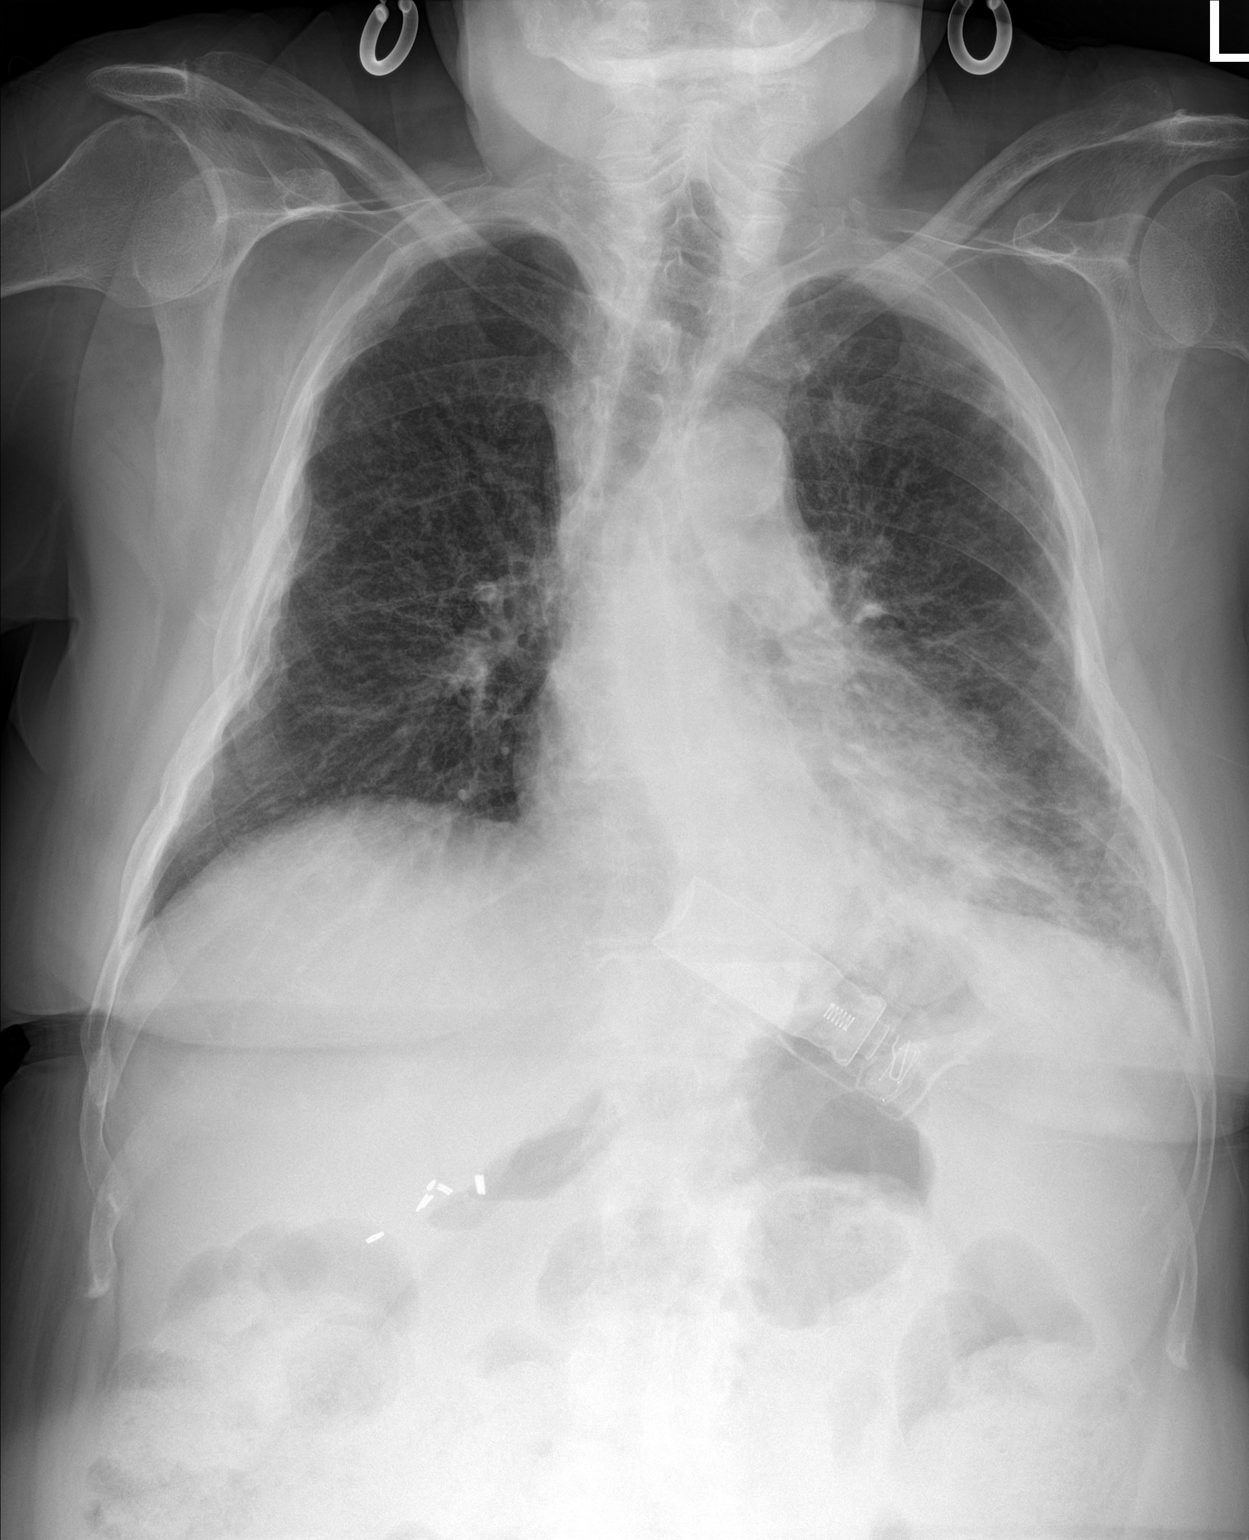

[chest lat]
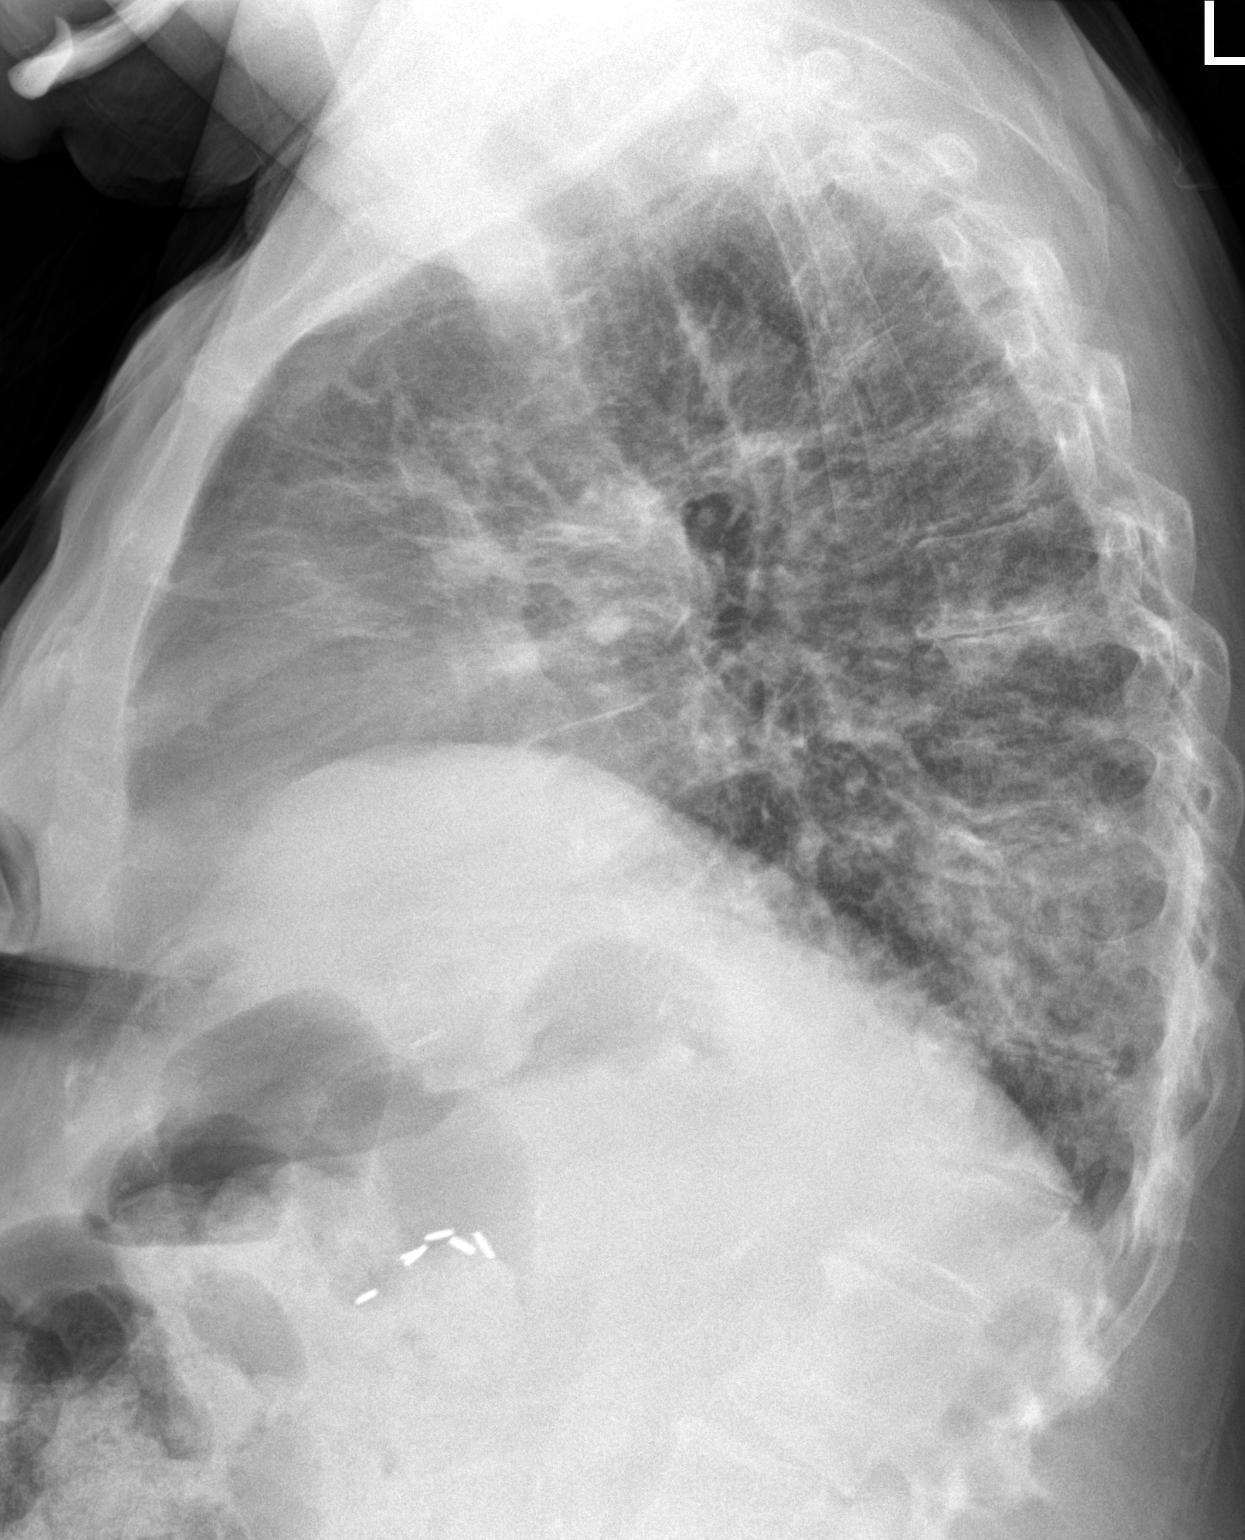

[2 of 2 positions shown; findings below may reference images not displayed]

FINDINGS: Left lower lobe nodular and streaky densities, concerning for
developing pneumonia. Clinical correlation and follow-up to shin
recommended. There is chronic diffuse interstitial coarsening. No
pleural effusion pneumothorax. Stable cardiomediastinal silhouette.
No acute osseous pathology. Old healed right rib fracture deformity.
Right upper quadrant cholecystectomy clips.
IMPRESSION: Findings concerning for left lower lobe infiltrate. Follow-up
recommended.

## 2022-06-21 NOTE — Telephone Encounter (Signed)
Joline Salt T, CPhT  You; Rx Prior Auth Team11 days ago    Trying to submit PA for Clonazepam. Please advise   Joline Salt T, CPhT11 days ago    Per cover my meds, there is a drug interaction between Clonazepam and Hydrodocone, Olanzapine, and Duloxetine. Please advise

## 2022-06-21 NOTE — Telephone Encounter (Signed)
Pt. Has been counselled repeatedly about the interaction between benzo & opiate. She has tolerated the combination up until now.

## 2022-06-22 ENCOUNTER — Other Ambulatory Visit: Payer: Self-pay | Admitting: Family Medicine

## 2022-06-22 DIAGNOSIS — E039 Hypothyroidism, unspecified: Secondary | ICD-10-CM

## 2022-06-29 ENCOUNTER — Ambulatory Visit (HOSPITAL_COMMUNITY): Payer: Medicare HMO

## 2022-06-30 ENCOUNTER — Ambulatory Visit (INDEPENDENT_AMBULATORY_CARE_PROVIDER_SITE_OTHER): Payer: Medicare HMO | Admitting: Nurse Practitioner

## 2022-06-30 ENCOUNTER — Encounter: Payer: Self-pay | Admitting: Nurse Practitioner

## 2022-06-30 VITALS — BP 146/84 | HR 93 | Temp 98.0°F | Resp 20 | Ht 62.0 in | Wt 152.0 lb

## 2022-06-30 DIAGNOSIS — F330 Major depressive disorder, recurrent, mild: Secondary | ICD-10-CM | POA: Diagnosis not present

## 2022-06-30 DIAGNOSIS — J440 Chronic obstructive pulmonary disease with acute lower respiratory infection: Secondary | ICD-10-CM

## 2022-06-30 DIAGNOSIS — J209 Acute bronchitis, unspecified: Secondary | ICD-10-CM

## 2022-06-30 MED ORDER — PREDNISONE 20 MG PO TABS: 40.0000 mg | ORAL_TABLET | Freq: Every day | ORAL | 0 refills | Status: AC

## 2022-06-30 MED ORDER — BENZONATATE 100 MG PO CAPS: 100.0000 mg | ORAL_CAPSULE | Freq: Two times a day (BID) | ORAL | 0 refills | Status: DC | PRN

## 2022-06-30 MED ORDER — DOXYCYCLINE HYCLATE 100 MG PO TABS: 100.0000 mg | ORAL_TABLET | Freq: Two times a day (BID) | ORAL | 0 refills | Status: DC

## 2022-06-30 NOTE — Patient Instructions (Signed)
1. Take meds as prescribed 2. Use a cool mist humidifier especially during the winter months and when heat has been humid. 3. Use saline nose sprays frequently 4. Saline irrigations of the nose can be very helpful if done frequently.  * 4X daily for 1 week*  * Use of a nettie pot can be helpful with this. Follow directions with this* 5. Drink plenty of fluids 6. Keep thermostat turn down low 7.For any cough or congestion- tessalon pereles as prescribed 8. For fever or aces or pains- take tylenol or ibuprofen appropriate for age and weight.  * for fevers greater than 101 orally you may alternate ibuprofen and tylenol every  3 hours.

## 2022-06-30 NOTE — Progress Notes (Signed)
Subjective:    Patient ID: Brittany Conrad, female    DOB: 1951/01/22, 71 y.o.   MRN: [id number]   Chief Complaint: Cough (2 weeks/)   Cough This is a new problem. The current episode started 1 to 4 weeks ago. The problem has been waxing and waning. The problem occurs every few minutes. Associated symptoms include nasal congestion, rhinorrhea and a sore throat. Pertinent negatives include no chills, fever or wheezing. Treatments tried: tessalon perlees. The treatment provided mild relief. Her past medical history is significant for COPD.   Patient has had a cough for 2 weeks. Causing her back to hurt.     Review of Systems  Constitutional:  Negative for chills and fever.  HENT:  Positive for rhinorrhea and sore throat.   Respiratory:  Positive for cough. Negative for wheezing.        Objective:   Physical Exam Constitutional:      Appearance: Normal appearance.  HENT:     Right Ear: Tympanic membrane normal.     Left Ear: Tympanic membrane normal.     Nose: Congestion and rhinorrhea present.     Mouth/Throat:     Pharynx: No oropharyngeal exudate or posterior oropharyngeal erythema.  Cardiovascular:     Rate and Rhythm: Normal rate and regular rhythm.     Heart sounds: Normal heart sounds.  Pulmonary:     Effort: Pulmonary effort is normal.     Breath sounds: Wheezing (exp wheezes throughout) present.     Comments: Deep tight cough Skin:    General: Skin is warm.  Neurological:     General: No focal deficit present.     Mental Status: She is alert and oriented to person, place, and time.  Psychiatric:        Mood and Affect: Mood normal.        Behavior: Behavior normal.     BP (!) 146/84   Pulse 93   Temp 98 F (36.7 C) (Temporal)   Resp 20   Ht 5\' 2"  (1.575 m)   Wt 152 lb (68.9 kg)   SpO2 90%   BMI 27.80 kg/m        Assessment & Plan:  in today with chief complaint of Cough (2 weeks/)   1. Acute bronchitis with COPD (HCC) 1. Take  meds as prescribed 2. Use a cool mist humidifier especially during the winter months and when heat has been humid. 3. Use saline nose sprays frequently 4. Saline irrigations of the nose can be very helpful if done frequently.  * 4X daily for 1 week*  * Use of a nettie pot can be helpful with this. Follow directions with this* 5. Drink plenty of fluids 6. Keep thermostat turn down low 7.For any cough or congestion- tessalon perles 8. For fever or aces or pains- take tylenol or ibuprofen appropriate for age and weight.  * for fevers greater than 101 orally you may alternate ibuprofen and tylenol every  3 hours.   Meds ordered this encounter  Medications   doxycycline (VIBRA-TABS) 100 MG tablet    Sig: Take 1 tablet (100 mg total) by mouth 2 (two) times daily. 1 po bid    Dispense:  20 tablet    Refill:  0    Order Specific Question:   Supervising Provider    Answer:   09-20-1985 A [[id number]]   predniSONE (DELTASONE) 20 MG tablet    Sig: Take 2 tablets (40 mg total) by mouth  daily with breakfast for 5 days. 2 po daily for 5 days    Dispense:  10 tablet    Refill:  0    Order Specific Question:   Supervising Provider    Answer:   Arville Care A [[id number]]   benzonatate (TESSALON) 100 MG capsule    Sig: Take 1 capsule (100 mg total) by mouth 2 (two) times daily as needed for cough.    Dispense:  20 capsule    Refill:  0    Order Specific Question:   Supervising Provider    Answer:   Arville Care A [[id number]]       The above assessment and management plan was discussed with the patient. The patient verbalized understanding of and has agreed to the management plan. Patient is aware to call the clinic if symptoms persist or worsen. Patient is aware when to return to the clinic for a follow-up visit. Patient educated on when it is appropriate to go to the emergency department.   Mary-Margaret Daphine Deutscher, FNP

## 2022-07-01 ENCOUNTER — Other Ambulatory Visit: Payer: Self-pay | Admitting: Family Medicine

## 2022-07-01 DIAGNOSIS — G894 Chronic pain syndrome: Secondary | ICD-10-CM | POA: Diagnosis not present

## 2022-07-01 DIAGNOSIS — M545 Low back pain, unspecified: Secondary | ICD-10-CM | POA: Diagnosis not present

## 2022-07-01 DIAGNOSIS — M25562 Pain in left knee: Secondary | ICD-10-CM | POA: Diagnosis not present

## 2022-07-01 DIAGNOSIS — M25561 Pain in right knee: Secondary | ICD-10-CM | POA: Diagnosis not present

## 2022-07-01 DIAGNOSIS — G609 Hereditary and idiopathic neuropathy, unspecified: Secondary | ICD-10-CM | POA: Diagnosis not present

## 2022-07-01 DIAGNOSIS — G8929 Other chronic pain: Secondary | ICD-10-CM | POA: Diagnosis not present

## 2022-07-01 DIAGNOSIS — F319 Bipolar disorder, unspecified: Secondary | ICD-10-CM | POA: Diagnosis not present

## 2022-07-01 DIAGNOSIS — J449 Chronic obstructive pulmonary disease, unspecified: Secondary | ICD-10-CM | POA: Diagnosis not present

## 2022-07-01 LAB — CBC WITH DIFFERENTIAL/PLATELET
Basophils Absolute: 0 10*3/uL (ref 0.0–0.2)
Basos: 0 %
EOS (ABSOLUTE): 0.2 10*3/uL (ref 0.0–0.4)
Eos: 2 %
Hematocrit: 29.1 % — ABNORMAL LOW (ref 34.0–46.6)
Hemoglobin: 9.1 g/dL — ABNORMAL LOW (ref 11.1–15.9)
Immature Grans (Abs): 0 10*3/uL (ref 0.0–0.1)
Immature Granulocytes: 0 %
Lymphocytes Absolute: 1.1 10*3/uL (ref 0.7–3.1)
Lymphs: 17 %
MCH: 30.1 pg (ref 26.6–33.0)
MCHC: 31.3 g/dL — ABNORMAL LOW (ref 31.5–35.7)
MCV: 96 fL (ref 79–97)
Monocytes Absolute: 0.7 10*3/uL (ref 0.1–0.9)
Monocytes: 10 %
Neutrophils Absolute: 4.7 10*3/uL (ref 1.4–7.0)
Neutrophils: 71 %
Platelets: 407 10*3/uL (ref 150–450)
RBC: 3.02 x10E6/uL — ABNORMAL LOW (ref 3.77–5.28)
RDW: 14.3 % (ref 11.7–15.4)
WBC: 6.7 10*3/uL (ref 3.4–10.8)

## 2022-07-01 LAB — CMP14+EGFR
ALT: 15 IU/L (ref 0–32)
AST: 14 IU/L (ref 0–40)
Albumin/Globulin Ratio: 1.2 (ref 1.2–2.2)
Albumin: 3.7 g/dL — ABNORMAL LOW (ref 3.9–4.9)
Alkaline Phosphatase: 155 IU/L — ABNORMAL HIGH (ref 44–121)
BUN/Creatinine Ratio: 15 (ref 12–28)
BUN: 18 mg/dL (ref 8–27)
Bilirubin Total: 0.5 mg/dL (ref 0.0–1.2)
CO2: 25 mmol/L (ref 20–29)
Calcium: 9 mg/dL (ref 8.7–10.3)
Chloride: 98 mmol/L (ref 96–106)
Creatinine, Ser: 1.22 mg/dL — ABNORMAL HIGH (ref 0.57–1.00)
Globulin, Total: 3 g/dL (ref 1.5–4.5)
Glucose: 115 mg/dL — ABNORMAL HIGH (ref 70–99)
Potassium: 3.9 mmol/L (ref 3.5–5.2)
Sodium: 140 mmol/L (ref 134–144)
Total Protein: 6.7 g/dL (ref 6.0–8.5)
eGFR: 48 mL/min/{1.73_m2} — ABNORMAL LOW (ref >59)

## 2022-07-01 LAB — TSH+FREE T4
Free T4: 1.63 ng/dL (ref 0.82–1.77)
TSH: 3.44 u[IU]/mL (ref 0.450–4.500)

## 2022-07-05 ENCOUNTER — Other Ambulatory Visit: Payer: Self-pay | Admitting: Family Medicine

## 2022-07-29 DIAGNOSIS — G8929 Other chronic pain: Secondary | ICD-10-CM | POA: Diagnosis not present

## 2022-07-29 DIAGNOSIS — F319 Bipolar disorder, unspecified: Secondary | ICD-10-CM | POA: Diagnosis not present

## 2022-07-29 DIAGNOSIS — M545 Low back pain, unspecified: Secondary | ICD-10-CM | POA: Diagnosis not present

## 2022-07-29 DIAGNOSIS — M25562 Pain in left knee: Secondary | ICD-10-CM | POA: Diagnosis not present

## 2022-07-29 DIAGNOSIS — G894 Chronic pain syndrome: Secondary | ICD-10-CM | POA: Diagnosis not present

## 2022-07-29 DIAGNOSIS — M129 Arthropathy, unspecified: Secondary | ICD-10-CM | POA: Diagnosis not present

## 2022-07-29 DIAGNOSIS — J449 Chronic obstructive pulmonary disease, unspecified: Secondary | ICD-10-CM | POA: Diagnosis not present

## 2022-07-29 DIAGNOSIS — G609 Hereditary and idiopathic neuropathy, unspecified: Secondary | ICD-10-CM | POA: Diagnosis not present

## 2022-07-29 DIAGNOSIS — M25561 Pain in right knee: Secondary | ICD-10-CM | POA: Diagnosis not present

## 2022-08-05 ENCOUNTER — Encounter: Payer: Self-pay | Admitting: Family Medicine

## 2022-08-07 ENCOUNTER — Other Ambulatory Visit: Payer: Self-pay | Admitting: Family Medicine

## 2022-08-09 ENCOUNTER — Other Ambulatory Visit: Payer: Self-pay | Admitting: Family Medicine

## 2022-08-18 ENCOUNTER — Other Ambulatory Visit: Payer: Self-pay | Admitting: Family Medicine

## 2022-08-19 ENCOUNTER — Other Ambulatory Visit: Payer: Self-pay | Admitting: Family Medicine

## 2022-08-30 DIAGNOSIS — M25561 Pain in right knee: Secondary | ICD-10-CM | POA: Diagnosis not present

## 2022-08-30 DIAGNOSIS — M545 Low back pain, unspecified: Secondary | ICD-10-CM | POA: Diagnosis not present

## 2022-08-30 DIAGNOSIS — G894 Chronic pain syndrome: Secondary | ICD-10-CM | POA: Diagnosis not present

## 2022-08-30 DIAGNOSIS — G609 Hereditary and idiopathic neuropathy, unspecified: Secondary | ICD-10-CM | POA: Diagnosis not present

## 2022-08-30 DIAGNOSIS — F319 Bipolar disorder, unspecified: Secondary | ICD-10-CM | POA: Diagnosis not present

## 2022-08-30 DIAGNOSIS — J449 Chronic obstructive pulmonary disease, unspecified: Secondary | ICD-10-CM | POA: Diagnosis not present

## 2022-08-30 DIAGNOSIS — G8929 Other chronic pain: Secondary | ICD-10-CM | POA: Diagnosis not present

## 2022-08-30 DIAGNOSIS — M25562 Pain in left knee: Secondary | ICD-10-CM | POA: Diagnosis not present

## 2022-08-30 DIAGNOSIS — F1120 Opioid dependence, uncomplicated: Secondary | ICD-10-CM | POA: Diagnosis not present

## 2022-09-01 ENCOUNTER — Ambulatory Visit (INDEPENDENT_AMBULATORY_CARE_PROVIDER_SITE_OTHER): Payer: Medicare HMO | Admitting: Family Medicine

## 2022-09-01 ENCOUNTER — Encounter: Payer: Self-pay | Admitting: Family Medicine

## 2022-09-01 VITALS — BP 124/76 | HR 91 | Temp 97.9°F | Ht 62.0 in | Wt 145.2 lb

## 2022-09-01 DIAGNOSIS — B370 Candidal stomatitis: Secondary | ICD-10-CM

## 2022-09-01 MED ORDER — MECLIZINE HCL 25 MG PO TABS: 25.0000 mg | ORAL_TABLET | Freq: Three times a day (TID) | ORAL | 5 refills | Status: DC | PRN

## 2022-09-01 MED ORDER — FLUCONAZOLE 100 MG PO TABS: ORAL_TABLET | ORAL | 0 refills | Status: DC

## 2022-09-01 NOTE — Progress Notes (Signed)
Mouth pain and  Subjective:  Patient ID: Brittany Conrad, female    DOB: December 16, 1950  Age: 72 y.o. MRN: [id number]  CC: Oral Pain and Sore Throat   HPI Brittany Conrad presents for a sore throat associated with mouth pain that is preventing her from chewing. Pain is severe. Did not respond to the treatment given recently with steroid and an antibiotic. No fever. No respiratory sx. Hurts to swallow.      09/01/2022   12:35 PM 09/01/2022   12:31 PM 06/30/2022   12:09 PM  Depression screen PHQ 2/9  Decreased Interest 2 0 2  Down, Depressed, Hopeless 0  2  PHQ - 2 Score 2 0 4  Altered sleeping 2  2  Tired, decreased energy 0  0  Change in appetite 0  0  Feeling bad or failure about yourself  1  0  Trouble concentrating 0  1  Moving slowly or fidgety/restless 1  2  Suicidal thoughts 0  0  PHQ-9 Score 6  9  Difficult doing work/chores Not difficult at all  Somewhat difficult    History Brittany Conrad has a past medical history of Asthma, Bipolar 1 disorder ([place]), COPD (chronic obstructive pulmonary disease) ([place]), Emphysema lung ([place]), Emphysema of lung ([place]), Neuropathy, and Scoliosis.   She has a past surgical history that includes Abdominal hysterectomy; Gallbladder surgery; Breast reduction surgery; tummy tuck; Back surgery; and Gastric bypass.   Her family history includes Alzheimer's disease in her maternal grandmother; Bipolar disorder in her daughter; Cancer in her father, mother, and paternal grandmother; Stroke in her paternal grandfather; Tuberculosis in her maternal grandfather.She reports that she quit smoking about 6 years ago. Her smoking use included cigarettes. She has a 70.00 pack-year smoking history. She has never used smokeless tobacco. She reports current alcohol use. She reports that she does not use drugs.    ROS Review of Systems  Constitutional:  Negative for appetite change, chills, diaphoresis, fatigue and fever.  HENT:  Positive for sore throat. Negative for  congestion, ear pain, hearing loss, mouth sores (however there is pain), postnasal drip, rhinorrhea and trouble swallowing.   Respiratory:  Negative for cough, chest tightness and shortness of breath.   Cardiovascular:  Negative for chest pain and palpitations.  Gastrointestinal:  Negative for abdominal pain.  Musculoskeletal:  Negative for arthralgias.  Skin:  Negative for rash.    Objective:  BP 124/76   Pulse 91   Temp 97.9 F (36.6 C)   Ht '5\' 2"'$  (1.575 m)   Wt 145 lb 3.2 oz (65.9 kg)   SpO2 95%   BMI 26.56 kg/m   BP Readings from Last 3 Encounters:  09/01/22 124/76  06/30/22 (!) 146/84  05/25/22 130/76    Wt Readings from Last 3 Encounters:  09/01/22 145 lb 3.2 oz (65.9 kg)  06/30/22 152 lb (68.9 kg)  05/25/22 156 lb 3.2 oz (70.9 kg)     Physical Exam Constitutional:      General: She is not in acute distress.    Appearance: She is well-developed.  HENT:     Mouth/Throat:     Mouth: Mucous membranes are moist.     Pharynx: Oropharyngeal exudate (greyish exudate on tongue. Tongue has a smooth appearance without visible taste buds) present.  Cardiovascular:     Rate and Rhythm: Normal rate and regular rhythm.  Pulmonary:     Breath sounds: Normal breath sounds.  Musculoskeletal:        General: Normal range of motion.  Skin:    General: Skin is warm and dry.  Neurological:     Mental Status: She is alert and oriented to person, place, and time.       Assessment & Plan:   Brittany Conrad was seen today for oral pain and sore throat.  Diagnoses and all orders for this visit:  Candidal stomatitis  Other orders -     fluconazole (DIFLUCAN) 100 MG tablet; Take two with first dose. Then starting the next day take one daily until all are taken. -     meclizine (ANTIVERT) 25 MG tablet; Take 1 tablet (25 mg total) by mouth 3 (three) times daily as needed for dizziness.       I am having Brittany Conrad start on fluconazole and meclizine. I am also having her  maintain her ibuprofen, CVS D3, HYDROcodone-acetaminophen, gabapentin, sucralfate, ipratropium, albuterol, Breztri Aerosphere, ferrous sulfate, amLODipine, albuterol, guaiFENesin, DULoxetine, pantoprazole, levothyroxine, doxycycline, benzonatate, amitriptyline, OLANZapine, clonazePAM, potassium chloride SA, busPIRone, hydrOXYzine, ARIPiprazole, and furosemide.  Allergies as of 09/01/2022       Reactions   Tramadol    Can't take d/t pain clinic        Medication List        Accurate as of September 01, 2022  9:35 PM. If you have any questions, ask your nurse or doctor.          albuterol 108 (90 Base) MCG/ACT inhaler Commonly known as: VENTOLIN HFA INHALE 1-2 PUFFS BY MOUTH EVERY 6 HOURS AS NEEDED FOR WHEEZE OR SHORTNESS OF BREATH   albuterol (2.5 MG/3ML) 0.083% nebulizer solution Commonly known as: PROVENTIL INHALE 3 ML BY NEBULIZATION EVERY 6 HOURS AS NEEDED FOR WHEEZING OR SHORTNESS OF BREATH   amitriptyline 100 MG tablet Commonly known as: ELAVIL TAKE TWO TABLETS BY MOUTH AT BEDTIME   amLODipine 10 MG tablet Commonly known as: NORVASC TAKE 1 TABLET BY MOUTH EVERY DAY   ARIPiprazole 30 MG tablet Commonly known as: ABILIFY TAKE ONE TABLET ONCE DAILY   benzonatate 100 MG capsule Commonly known as: TESSALON Take 1 capsule (100 mg total) by mouth 2 (two) times daily as needed for cough.   Breztri Aerosphere 160-9-4.8 MCG/ACT Aero Generic drug: Budeson-Glycopyrrol-Formoterol Inhale 2 puffs into the lungs 2 (two) times daily.   busPIRone 10 MG tablet Commonly known as: BUSPAR TAKE ONE TABLET THREE TIMES DAILY   clonazePAM 0.5 MG tablet Commonly known as: KLONOPIN TAKE ONE TABLET BY MOUTH AT BEDTIME AS NEEDED FOR ANXIETY   CVS D3 25 MCG (1000 UT) capsule Generic drug: Cholecalciferol TAKE 1 CAPSULE (1,000 UNITS TOTAL) BY MOUTH DAILY.   doxycycline 100 MG tablet Commonly known as: VIBRA-TABS Take 1 tablet (100 mg total) by mouth 2 (two) times daily. 1 po bid    DULoxetine 60 MG capsule Commonly known as: CYMBALTA Take 1 capsule (60 mg total) by mouth 2 (two) times daily.   ferrous sulfate 325 (65 FE) MG tablet Take 325 mg by mouth every morning.   fluconazole 100 MG tablet Commonly known as: Diflucan Take two with first dose. Then starting the next day take one daily until all are taken. Started by: [name], MD   furosemide 40 MG tablet Commonly known as: LASIX TAKE ONE TABLET TWICE DAILY   gabapentin 400 MG capsule Commonly known as: NEURONTIN Take 400 mg by mouth 3 (three) times daily.   guaiFENesin 600 MG 12 hr tablet Commonly known as: Mucinex Take 2 tablets (1,200 mg total) by mouth 2 (two) times daily  as needed for cough or to loosen phlegm.   HYDROcodone-acetaminophen 7.5-325 MG tablet Commonly known as: NORCO Take 1 tablet by mouth every 6 (six) hours as needed for moderate pain.   hydrOXYzine 25 MG capsule Commonly known as: VISTARIL TAKE ONE CAPSULE EVERY 8 HOURS AS NEEDED   ibuprofen 800 MG tablet Commonly known as: ADVIL Take 800 mg by mouth 3 (three) times daily as needed.   ipratropium 0.02 % nebulizer solution Commonly known as: ATROVENT Take 2.5 mLs (0.5 mg total) by nebulization 4 (four) times daily.   levothyroxine 100 MCG tablet Commonly known as: SYNTHROID TAKE ONE TABLET ONCE DAILY   meclizine 25 MG tablet Commonly known as: ANTIVERT Take 1 tablet (25 mg total) by mouth 3 (three) times daily as needed for dizziness. Started by: [name], MD   OLANZapine 5 MG tablet Commonly known as: ZYPREXA TAKE ONE TABLET AT BEDTIME   pantoprazole 40 MG tablet Commonly known as: PROTONIX Take 1 tablet (40 mg total) by mouth 2 (two) times daily.   potassium chloride SA 20 MEQ tablet Commonly known as: KLOR-CON M TAKE TWO TABLETS THREE TIMES DAILY FOR POTASSIUM SUPPLEMENT   sucralfate 1 g tablet Commonly known as: CARAFATE         Follow-up: No follow-ups on file.  [name], M.D.

## 2022-09-03 ENCOUNTER — Other Ambulatory Visit: Payer: Self-pay | Admitting: Family Medicine

## 2022-09-14 ENCOUNTER — Other Ambulatory Visit: Payer: Self-pay | Admitting: Family Medicine

## 2022-09-14 DIAGNOSIS — E039 Hypothyroidism, unspecified: Secondary | ICD-10-CM

## 2022-09-26 DIAGNOSIS — J449 Chronic obstructive pulmonary disease, unspecified: Secondary | ICD-10-CM | POA: Diagnosis not present

## 2022-09-28 DIAGNOSIS — G894 Chronic pain syndrome: Secondary | ICD-10-CM | POA: Diagnosis not present

## 2022-09-28 DIAGNOSIS — Z79899 Other long term (current) drug therapy: Secondary | ICD-10-CM | POA: Diagnosis not present

## 2022-09-28 DIAGNOSIS — G8929 Other chronic pain: Secondary | ICD-10-CM | POA: Diagnosis not present

## 2022-09-28 DIAGNOSIS — M25562 Pain in left knee: Secondary | ICD-10-CM | POA: Diagnosis not present

## 2022-09-28 DIAGNOSIS — M545 Low back pain, unspecified: Secondary | ICD-10-CM | POA: Diagnosis not present

## 2022-09-28 DIAGNOSIS — M25561 Pain in right knee: Secondary | ICD-10-CM | POA: Diagnosis not present

## 2022-09-28 DIAGNOSIS — J449 Chronic obstructive pulmonary disease, unspecified: Secondary | ICD-10-CM | POA: Diagnosis not present

## 2022-09-28 DIAGNOSIS — F319 Bipolar disorder, unspecified: Secondary | ICD-10-CM | POA: Diagnosis not present

## 2022-09-28 DIAGNOSIS — G609 Hereditary and idiopathic neuropathy, unspecified: Secondary | ICD-10-CM | POA: Diagnosis not present

## 2022-09-30 DIAGNOSIS — Z79899 Other long term (current) drug therapy: Secondary | ICD-10-CM | POA: Diagnosis not present

## 2022-10-05 ENCOUNTER — Other Ambulatory Visit: Payer: Self-pay | Admitting: Family Medicine

## 2022-10-11 ENCOUNTER — Other Ambulatory Visit: Payer: Self-pay | Admitting: Family Medicine

## 2022-10-18 ENCOUNTER — Encounter: Payer: Self-pay | Admitting: Family Medicine

## 2022-10-18 ENCOUNTER — Ambulatory Visit (INDEPENDENT_AMBULATORY_CARE_PROVIDER_SITE_OTHER): Payer: Medicare HMO | Admitting: Family Medicine

## 2022-10-18 VITALS — BP 149/81 | HR 87 | Temp 98.1°F | Ht 62.0 in | Wt 138.8 lb

## 2022-10-18 DIAGNOSIS — E039 Hypothyroidism, unspecified: Secondary | ICD-10-CM

## 2022-10-18 DIAGNOSIS — Z13220 Encounter for screening for lipoid disorders: Secondary | ICD-10-CM

## 2022-10-18 DIAGNOSIS — I10 Essential (primary) hypertension: Secondary | ICD-10-CM

## 2022-10-18 DIAGNOSIS — Z79899 Other long term (current) drug therapy: Secondary | ICD-10-CM | POA: Diagnosis not present

## 2022-10-18 MED ORDER — OLANZAPINE 5 MG PO TABS: 5.0000 mg | ORAL_TABLET | Freq: Every day | ORAL | 3 refills | Status: DC

## 2022-10-18 MED ORDER — AMITRIPTYLINE HCL 100 MG PO TABS: 200.0000 mg | ORAL_TABLET | Freq: Every day | ORAL | 3 refills | Status: DC

## 2022-10-18 MED ORDER — AMLODIPINE BESYLATE 10 MG PO TABS: 10.0000 mg | ORAL_TABLET | Freq: Every day | ORAL | 3 refills | Status: DC

## 2022-10-18 MED ORDER — BUSPIRONE HCL 10 MG PO TABS: 10.0000 mg | ORAL_TABLET | Freq: Three times a day (TID) | ORAL | 0 refills | Status: DC

## 2022-10-18 MED ORDER — CLONAZEPAM 0.5 MG PO TABS: ORAL_TABLET | ORAL | 1 refills | Status: DC

## 2022-10-18 NOTE — Progress Notes (Signed)
Subjective:  Patient ID: Brittany Conrad, female    DOB: [birth year]  Age: 72 y.o. MRN: [id number]  CC: Medical Management of Chronic Issues   HPI Brittany Conrad presents for  follow-up of hypertension. Patient has no history of headache chest pain or shortness of breath or recent cough. Patient also denies symptoms of TIA such as focal numbness or weakness. Patient denies side effects from medication. States taking it regularly. Runs good at home. Had a bad morning. Conflict occurred with her sister.  Patient in for follow-up of GERD. Currently asymptomatic taking  PPI daily. There is no chest pain or heartburn. No hematemesis and no melena. No dysphagia or choking. Onset is remote. Progression is stable. Complicating factors, none.   follow-up on  thyroid. The patient has a history of hypothyroidism for many years. It has been stable recently. Pt. denies any change in  voice, loss of hair, heat or cold intolerance. Energy level has been adequate to good. Patient denies constipation and diarrhea. No myxedema. Medication is as noted below. Verified that pt is taking it daily on an empty stomach. Well tolerated.      10/18/2022    2:07 PM 09/01/2022   12:35 PM 09/01/2022   12:31 PM 06/30/2022   12:09 PM 04/26/2022    1:43 PM  Depression screen PHQ 2/9  Decreased Interest 0 2 0 2 1  Down, Depressed, Hopeless 0 0  2 0  PHQ - 2 Score 0 2 0 4 1  Altered sleeping  2  2 2   Tired, decreased energy  0  0 3  Change in appetite  0  0 3  Feeling bad or failure about yourself   1  0 2  Trouble concentrating  0  1 2  Moving slowly or fidgety/restless  1  2 1   Suicidal thoughts  0  0 0  PHQ-9 Score  6  9 14   Difficult doing work/chores  Not difficult at all  Somewhat difficult Somewhat difficult      09/01/2022   12:36 PM 06/30/2022   12:09 PM 04/26/2022    1:44 PM 02/16/2022    1:06 PM  GAD 7 : Generalized Anxiety Score  Nervous, Anxious, on Edge 1 3 2 3   Control/stop worrying 1 2 2 2   Worry  too much - different things 1 1 2 2   Trouble relaxing 1 2 2 2   Restless 0 2 1 1   Easily annoyed or irritable 2 2 0 1  Afraid - awful might happen 1 2 2 1   Total GAD 7 Score 7 14 11 12   Anxiety Difficulty Not difficult at all Somewhat difficult Not difficult at all Somewhat difficult       History Brittany Conrad has a past medical history of Asthma, Bipolar 1 disorder, COPD (chronic obstructive pulmonary disease), Emphysema lung, Emphysema of lung, Neuropathy, and Scoliosis.   She has a past surgical history that includes Abdominal hysterectomy; Gallbladder surgery; Breast reduction surgery; tummy tuck; Back surgery; and Gastric bypass.   Her family history includes Alzheimer's disease in her maternal grandmother; Bipolar disorder in her daughter; Cancer in her father, mother, and paternal grandmother; Stroke in her paternal grandfather; Tuberculosis in her maternal grandfather.She reports that she quit smoking about 6 years ago. Her smoking use included cigarettes. She has a 70.00 pack-year smoking history. She has never used smokeless tobacco. She reports current alcohol use. She reports that she does not use drugs.  Current Outpatient Medications on File Prior to Visit  Medication Sig Dispense Refill   albuterol (PROVENTIL) (2.5 MG/3ML) 0.083% nebulizer solution INHALE 3 ML BY NEBULIZATION EVERY 6 HOURS AS NEEDED FOR WHEEZING OR SHORTNESS OF BREATH 150 mL 5   albuterol (VENTOLIN HFA) 108 (90 Base) MCG/ACT inhaler INHALE 1-2 PUFFS BY MOUTH EVERY 6 HOURS AS NEEDED FOR WHEEZE OR SHORTNESS OF BREATH 18 each 2   ARIPiprazole (ABILIFY) 30 MG tablet Take 1 tablet (30 mg total) by mouth daily. (NEEDS TO BE SEEN BEFORE NEXT REFILL) 30 tablet 0   Budeson-Glycopyrrol-Formoterol (BREZTRI AEROSPHERE) 160-9-4.8 MCG/ACT AERO Inhale 2 puffs into the lungs 2 (two) times daily. 10.7 g 11   CVS D3 25 MCG (1000 UT) capsule TAKE 1 CAPSULE (1,000 UNITS TOTAL) BY MOUTH DAILY. 120 capsule 11   DULoxetine (CYMBALTA) 60 MG  capsule TAKE ONE CAPSULE BY MOUTH TWICE DAILY 60 capsule 2   ferrous sulfate 325 (65 FE) MG tablet Take 325 mg by mouth every morning.     fluconazole (DIFLUCAN) 100 MG tablet Take two with first dose. Then starting the next day take one daily until all are taken. 15 tablet 0   furosemide (LASIX) 40 MG tablet TAKE ONE TABLET TWICE DAILY 180 tablet 0   gabapentin (NEURONTIN) 400 MG capsule Take 400 mg by mouth 3 (three) times daily.     HYDROcodone-acetaminophen (NORCO) 7.5-325 MG tablet Take 1 tablet by mouth every 6 (six) hours as needed for moderate pain.     hydrOXYzine (VISTARIL) 25 MG capsule TAKE ONE CAPSULE EVERY 8 HOURS AS NEEDED 30 capsule 0   ibuprofen (ADVIL,MOTRIN) 800 MG tablet Take 800 mg by mouth 3 (three) times daily as needed.  0   ipratropium (ATROVENT) 0.02 % nebulizer solution Take 2.5 mLs (0.5 mg total) by nebulization 4 (four) times daily. 75 mL 12   levothyroxine (SYNTHROID) 100 MCG tablet TAKE ONE TABLET ONCE DAILY 90 tablet 2   meclizine (ANTIVERT) 25 MG tablet Take 1 tablet (25 mg total) by mouth 3 (three) times daily as needed for dizziness. 30 tablet 5   pantoprazole (PROTONIX) 40 MG tablet Take 1 tablet (40 mg total) by mouth 2 (two) times daily. 180 tablet 1   potassium chloride SA (KLOR-CON M) 20 MEQ tablet TAKE TWO TABLETS THREE TIMES DAILY FOR POTASSIUM SUPPLEMENT 540 tablet 0   sucralfate (CARAFATE) 1 g tablet      No current facility-administered medications on file prior to visit.    ROS Review of Systems  Constitutional: Negative.   HENT: Negative.    Eyes:  Negative for visual disturbance.  Respiratory:  Negative for shortness of breath.   Cardiovascular:  Negative for chest pain.  Gastrointestinal:  Negative for abdominal pain.  Musculoskeletal:  Negative for arthralgias.    Objective:  BP (!) 149/81   Pulse 87   Temp 98.1 F (36.7 C)   Ht 5\' 2"  (1.575 m)   Wt 138 lb 12.8 oz (63 kg)   SpO2 94%   BMI 25.39 kg/m   BP Readings from Last 3  Encounters:  10/18/22 (!) 149/81  09/01/22 124/76  06/30/22 (!) 146/84    Wt Readings from Last 3 Encounters:  10/18/22 138 lb 12.8 oz (63 kg)  09/01/22 145 lb 3.2 oz (65.9 kg)  06/30/22 152 lb (68.9 kg)     Physical Exam Constitutional:      General: She is not in acute distress.    Appearance: She is well-developed.  Cardiovascular:     Rate and Rhythm: Normal rate and  regular rhythm.  Pulmonary:     Breath sounds: Normal breath sounds.  Musculoskeletal:        General: Normal range of motion.  Skin:    General: Skin is warm and dry.  Neurological:     Mental Status: She is alert and oriented to person, place, and time.       Assessment & Plan:   [place] was seen today for medical management of chronic issues.  Diagnoses and all orders for this visit:  Hypothyroidism, unspecified type -     TSH + free T4  Essential hypertension -     CBC with Differential/Platelet -     CMP14+EGFR  Lipid screening -     Lipid panel  Controlled substance agreement signed -     ToxASSURE Select 13 (MW), Urine  Other orders -     amitriptyline (ELAVIL) 100 MG tablet; Take 2 tablets (200 mg total) by mouth at bedtime. -     clonazePAM (KLONOPIN) 0.5 MG tablet; TAKE ONE TABLET BY MOUTH AT BEDTIME AS NEEDED FOR ANXIETY -     OLANZapine (ZYPREXA) 5 MG tablet; Take 1 tablet (5 mg total) by mouth at bedtime. -     amLODipine (NORVASC) 10 MG tablet; Take 1 tablet (10 mg total) by mouth daily. -     busPIRone (BUSPAR) 10 MG tablet; Take 1 tablet (10 mg total) by mouth 3 (three) times daily.   Allergies as of 10/18/2022       Reactions   Tramadol    Can't take d/t pain clinic        Medication List        Accurate as of October 18, 2022  2:46 PM. If you have any questions, ask your nurse or doctor.          STOP taking these medications    benzonatate 100 MG capsule Commonly known as: TESSALON Stopped by: Mechele Claude, MD   doxycycline 100 MG tablet Commonly  known as: VIBRA-TABS Stopped by: Mechele Claude, MD   guaiFENesin 600 MG 12 hr tablet Commonly known as: Mucinex Stopped by: Mechele Claude, MD       TAKE these medications    albuterol 108 (90 Base) MCG/ACT inhaler Commonly known as: VENTOLIN HFA INHALE 1-2 PUFFS BY MOUTH EVERY 6 HOURS AS NEEDED FOR WHEEZE OR SHORTNESS OF BREATH   albuterol (2.5 MG/3ML) 0.083% nebulizer solution Commonly known as: PROVENTIL INHALE 3 ML BY NEBULIZATION EVERY 6 HOURS AS NEEDED FOR WHEEZING OR SHORTNESS OF BREATH   amitriptyline 100 MG tablet Commonly known as: ELAVIL Take 2 tablets (200 mg total) by mouth at bedtime. What changed: additional instructions Changed by: Mechele Claude, MD   amLODipine 10 MG tablet Commonly known as: NORVASC Take 1 tablet (10 mg total) by mouth daily.   ARIPiprazole 30 MG tablet Commonly known as: ABILIFY Take 1 tablet (30 mg total) by mouth daily. (NEEDS TO BE SEEN BEFORE NEXT REFILL)   Breztri Aerosphere 160-9-4.8 MCG/ACT Aero Generic drug: Budeson-Glycopyrrol-Formoterol Inhale 2 puffs into the lungs 2 (two) times daily.   busPIRone 10 MG tablet Commonly known as: BUSPAR Take 1 tablet (10 mg total) by mouth 3 (three) times daily.   clonazePAM 0.5 MG tablet Commonly known as: KLONOPIN TAKE ONE TABLET BY MOUTH AT BEDTIME AS NEEDED FOR ANXIETY   CVS D3 25 MCG (1000 UT) capsule Generic drug: Cholecalciferol TAKE 1 CAPSULE (1,000 UNITS TOTAL) BY MOUTH DAILY.   DULoxetine 60 MG capsule Commonly known as:  CYMBALTA TAKE ONE CAPSULE BY MOUTH TWICE DAILY   ferrous sulfate 325 (65 FE) MG tablet Take 325 mg by mouth every morning.   fluconazole 100 MG tablet Commonly known as: Diflucan Take two with first dose. Then starting the next day take one daily until all are taken.   furosemide 40 MG tablet Commonly known as: LASIX TAKE ONE TABLET TWICE DAILY   gabapentin 400 MG capsule Commonly known as: NEURONTIN Take 400 mg by mouth 3 (three) times daily.    HYDROcodone-acetaminophen 7.5-325 MG tablet Commonly known as: NORCO Take 1 tablet by mouth every 6 (six) hours as needed for moderate pain.   hydrOXYzine 25 MG capsule Commonly known as: VISTARIL TAKE ONE CAPSULE EVERY 8 HOURS AS NEEDED   ibuprofen 800 MG tablet Commonly known as: ADVIL Take 800 mg by mouth 3 (three) times daily as needed.   ipratropium 0.02 % nebulizer solution Commonly known as: ATROVENT Take 2.5 mLs (0.5 mg total) by nebulization 4 (four) times daily.   levothyroxine 100 MCG tablet Commonly known as: SYNTHROID TAKE ONE TABLET ONCE DAILY   meclizine 25 MG tablet Commonly known as: ANTIVERT Take 1 tablet (25 mg total) by mouth 3 (three) times daily as needed for dizziness.   OLANZapine 5 MG tablet Commonly known as: ZYPREXA Take 1 tablet (5 mg total) by mouth at bedtime.   pantoprazole 40 MG tablet Commonly known as: PROTONIX Take 1 tablet (40 mg total) by mouth 2 (two) times daily.   potassium chloride SA 20 MEQ tablet Commonly known as: KLOR-CON M TAKE TWO TABLETS THREE TIMES DAILY FOR POTASSIUM SUPPLEMENT   sucralfate 1 g tablet Commonly known as: CARAFATE        Meds ordered this encounter  Medications   amitriptyline (ELAVIL) 100 MG tablet    Sig: Take 2 tablets (200 mg total) by mouth at bedtime.    Dispense:  180 tablet    Refill:  3   clonazePAM (KLONOPIN) 0.5 MG tablet    Sig: TAKE ONE TABLET BY MOUTH AT BEDTIME AS NEEDED FOR ANXIETY    Dispense:  90 tablet    Refill:  1   OLANZapine (ZYPREXA) 5 MG tablet    Sig: Take 1 tablet (5 mg total) by mouth at bedtime.    Dispense:  90 tablet    Refill:  3   amLODipine (NORVASC) 10 MG tablet    Sig: Take 1 tablet (10 mg total) by mouth daily.    Dispense:  90 tablet    Refill:  3   busPIRone (BUSPAR) 10 MG tablet    Sig: Take 1 tablet (10 mg total) by mouth 3 (three) times daily.    Dispense:  270 tablet    Refill:  0      Follow-up: No follow-ups on file.  Mechele Claude,  M.D.

## 2022-10-28 DIAGNOSIS — F1120 Opioid dependence, uncomplicated: Secondary | ICD-10-CM | POA: Diagnosis not present

## 2022-10-28 DIAGNOSIS — M25562 Pain in left knee: Secondary | ICD-10-CM | POA: Diagnosis not present

## 2022-10-28 DIAGNOSIS — J449 Chronic obstructive pulmonary disease, unspecified: Secondary | ICD-10-CM | POA: Diagnosis not present

## 2022-10-28 DIAGNOSIS — F319 Bipolar disorder, unspecified: Secondary | ICD-10-CM | POA: Diagnosis not present

## 2022-10-28 DIAGNOSIS — M25561 Pain in right knee: Secondary | ICD-10-CM | POA: Diagnosis not present

## 2022-10-28 DIAGNOSIS — G8929 Other chronic pain: Secondary | ICD-10-CM | POA: Diagnosis not present

## 2022-10-28 DIAGNOSIS — M545 Low back pain, unspecified: Secondary | ICD-10-CM | POA: Diagnosis not present

## 2022-10-28 DIAGNOSIS — G609 Hereditary and idiopathic neuropathy, unspecified: Secondary | ICD-10-CM | POA: Diagnosis not present

## 2022-11-04 ENCOUNTER — Other Ambulatory Visit: Payer: Self-pay | Admitting: Family Medicine

## 2022-11-25 DIAGNOSIS — J449 Chronic obstructive pulmonary disease, unspecified: Secondary | ICD-10-CM | POA: Diagnosis not present

## 2022-11-25 DIAGNOSIS — G8929 Other chronic pain: Secondary | ICD-10-CM | POA: Diagnosis not present

## 2022-11-25 DIAGNOSIS — G894 Chronic pain syndrome: Secondary | ICD-10-CM | POA: Diagnosis not present

## 2022-11-25 DIAGNOSIS — M25561 Pain in right knee: Secondary | ICD-10-CM | POA: Diagnosis not present

## 2022-11-25 DIAGNOSIS — M25562 Pain in left knee: Secondary | ICD-10-CM | POA: Diagnosis not present

## 2022-11-25 DIAGNOSIS — F319 Bipolar disorder, unspecified: Secondary | ICD-10-CM | POA: Diagnosis not present

## 2022-11-25 DIAGNOSIS — G609 Hereditary and idiopathic neuropathy, unspecified: Secondary | ICD-10-CM | POA: Diagnosis not present

## 2022-11-25 DIAGNOSIS — M545 Low back pain, unspecified: Secondary | ICD-10-CM | POA: Diagnosis not present

## 2022-11-25 DIAGNOSIS — F1120 Opioid dependence, uncomplicated: Secondary | ICD-10-CM | POA: Diagnosis not present

## 2022-12-01 ENCOUNTER — Other Ambulatory Visit: Payer: Self-pay

## 2022-12-01 ENCOUNTER — Encounter: Payer: Self-pay | Admitting: Family Medicine

## 2022-12-01 ENCOUNTER — Other Ambulatory Visit: Payer: Self-pay | Admitting: *Deleted

## 2022-12-01 ENCOUNTER — Ambulatory Visit (INDEPENDENT_AMBULATORY_CARE_PROVIDER_SITE_OTHER): Payer: Medicare HMO | Admitting: Family Medicine

## 2022-12-01 VITALS — BP 132/76 | HR 79 | Temp 97.2°F | Ht 62.0 in | Wt 141.0 lb

## 2022-12-01 DIAGNOSIS — I10 Essential (primary) hypertension: Secondary | ICD-10-CM | POA: Diagnosis not present

## 2022-12-01 DIAGNOSIS — Z79899 Other long term (current) drug therapy: Secondary | ICD-10-CM | POA: Diagnosis not present

## 2022-12-01 DIAGNOSIS — W19XXXA Unspecified fall, initial encounter: Secondary | ICD-10-CM | POA: Diagnosis not present

## 2022-12-01 DIAGNOSIS — E039 Hypothyroidism, unspecified: Secondary | ICD-10-CM | POA: Diagnosis not present

## 2022-12-01 DIAGNOSIS — S76311A Strain of muscle, fascia and tendon of the posterior muscle group at thigh level, right thigh, initial encounter: Secondary | ICD-10-CM | POA: Diagnosis not present

## 2022-12-01 DIAGNOSIS — Z13220 Encounter for screening for lipoid disorders: Secondary | ICD-10-CM | POA: Diagnosis not present

## 2022-12-01 LAB — CBC WITH DIFFERENTIAL/PLATELET
Basos: 0 %
Hematocrit: 29.6 % — ABNORMAL LOW (ref 34.0–46.6)
Hemoglobin: 9.5 g/dL — ABNORMAL LOW (ref 11.1–15.9)
Immature Grans (Abs): 0 10*3/uL (ref 0.0–0.1)
MCH: 31.5 pg (ref 26.6–33.0)
Platelets: 324 10*3/uL (ref 150–450)
RBC: 3.02 x10E6/uL — ABNORMAL LOW (ref 3.77–5.28)

## 2022-12-01 LAB — CMP14+EGFR

## 2022-12-01 LAB — LIPID PANEL

## 2022-12-01 MED ORDER — PREDNISONE 20 MG PO TABS
40.0000 mg | ORAL_TABLET | Freq: Every day | ORAL | 0 refills | Status: AC
Start: 2022-12-01 — End: 2022-12-06

## 2022-12-01 NOTE — Progress Notes (Signed)
Acute Office Visit  Subjective:  Patient ID: Brittany Conrad, female    DOB: [birth year], 72 y.o.   MRN: [id number]  Chief Complaint  Patient presents with   Fall    Right leg pain   Fall   Patient is in today for possible pulled hamstring. States that she fell into the end table. [place] Seat backwards and broke table. Pain in back of thigh. Rates it as 8/10. States that she is on pain medicine and it is not helping. Has not tried anything else for the pain. Denies numbness, tingling. Endorses some weakness in her leg. Denies any popping or tearing sensation.   ROS As per HPI   Objective:  BP 132/76   Pulse 79   Temp (!) 97.2 F (36.2 C) (Temporal)   Ht 5\' 2"  (1.575 m)   Wt 141 lb (64 kg)   SpO2 93%   BMI 25.79 kg/m   Physical Exam Constitutional:      General: She is awake. She is not in acute distress.    Appearance: Normal appearance. She is well-developed and well-groomed. She is not ill-appearing, toxic-appearing or diaphoretic.  Cardiovascular:     Rate and Rhythm: Normal rate.     Pulses: Normal pulses.          Radial pulses are 2+ on the right side and 2+ on the left side.       Posterior tibial pulses are 2+ on the right side and 2+ on the left side.     Heart sounds: Normal heart sounds. No murmur heard.    No gallop.  Pulmonary:     Effort: Pulmonary effort is normal. No respiratory distress.     Breath sounds: Normal breath sounds. No stridor. No wheezing, rhonchi or rales.  Musculoskeletal:     Cervical back: Full passive range of motion without pain and neck supple.     Right lower leg: No deformity, lacerations, tenderness or bony tenderness. No edema.     Left lower leg: No edema.     Comments: Walks with cane   Skin:    General: Skin is warm.     Capillary Refill: Capillary refill takes less than 2 seconds.  Neurological:     General: No focal deficit present.     Mental Status: She is alert, oriented to person, place, and time and easily aroused. Mental  status is at baseline.     GCS: GCS eye subscore is 4. GCS verbal subscore is 5. GCS motor subscore is 6.     Motor: No weakness.  Psychiatric:        Attention and Perception: Attention and perception normal.        Mood and Affect: Mood and affect normal.        Speech: Speech normal.        Behavior: Behavior normal. Behavior is cooperative.        Thought Content: Thought content normal. Thought content does not include homicidal or suicidal ideation. Thought content does not include homicidal or suicidal plan.        Cognition and Memory: Cognition and memory normal.        Judgment: Judgment normal.     Assessment & Plan:  1. Strain of right hamstring muscle, initial encounter 2. Fall, initial encounter Medication as below. Given GFR and multiple medications that cause drowsiness and QT prolongation, chose prednisone as below. Discussed with patient using ice and heat to help with pain. Provided written and verbal  instructions.  - predniSONE (DELTASONE) 20 MG tablet; Take 2 tablets (40 mg total) by mouth daily with breakfast for 5 days.  Dispense: 10 tablet; Refill: 0  The above assessment and management plan was discussed with the patient. The patient verbalized understanding of and has agreed to the management plan using shared-decision making. Patient is aware to call the clinic if they develop any new symptoms or if symptoms fail to improve or worsen. Patient is aware when to return to the clinic for a follow-up visit. Patient educated on when it is appropriate to go to the emergency department.   Return for w/ PCP .  Neale Burly, DNP-FNP [facility] [address] [place], [place] [address] [phone]

## 2022-12-02 ENCOUNTER — Other Ambulatory Visit: Payer: Self-pay | Admitting: Family Medicine

## 2022-12-02 LAB — CBC WITH DIFFERENTIAL/PLATELET
Basophils Absolute: 0 10*3/uL (ref 0.0–0.2)
EOS (ABSOLUTE): 0.1 10*3/uL (ref 0.0–0.4)
Eos: 1 %
Immature Granulocytes: 0 %
Lymphocytes Absolute: 1.2 10*3/uL (ref 0.7–3.1)
Lymphs: 20 %
MCHC: 32.1 g/dL (ref 31.5–35.7)
MCV: 98 fL — ABNORMAL HIGH (ref 79–97)
Monocytes Absolute: 0.4 10*3/uL (ref 0.1–0.9)
Monocytes: 6 %
Neutrophils Absolute: 4.4 10*3/uL (ref 1.4–7.0)
Neutrophils: 73 %
RDW: 14.3 % (ref 11.7–15.4)
WBC: 6 10*3/uL (ref 3.4–10.8)

## 2022-12-02 LAB — LIPID PANEL
Cholesterol, Total: 186 mg/dL (ref 100–199)
LDL Chol Calc (NIH): 96 mg/dL (ref 0–99)
VLDL Cholesterol Cal: 34 mg/dL (ref 5–40)

## 2022-12-02 LAB — TSH+FREE T4
Free T4: 1.07 ng/dL (ref 0.82–1.77)
TSH: 2.11 u[IU]/mL (ref 0.450–4.500)

## 2022-12-02 LAB — CMP14+EGFR
Albumin: 4 g/dL (ref 3.9–4.9)
Alkaline Phosphatase: 122 IU/L — ABNORMAL HIGH (ref 44–121)
BUN/Creatinine Ratio: 16 (ref 12–28)
Bilirubin Total: 0.4 mg/dL (ref 0.0–1.2)
Calcium: 8.6 mg/dL — ABNORMAL LOW (ref 8.7–10.3)
Creatinine, Ser: 0.99 mg/dL (ref 0.57–1.00)
Glucose: 93 mg/dL (ref 70–99)
Sodium: 142 mmol/L (ref 134–144)

## 2022-12-02 MED ORDER — TIZANIDINE HCL 4 MG PO TABS: 4.0000 mg | ORAL_TABLET | Freq: Four times a day (QID) | ORAL | 1 refills | Status: DC | PRN

## 2022-12-06 ENCOUNTER — Other Ambulatory Visit: Payer: Self-pay | Admitting: Family Medicine

## 2022-12-07 ENCOUNTER — Other Ambulatory Visit: Payer: Self-pay | Admitting: Family Medicine

## 2022-12-07 ENCOUNTER — Telehealth: Payer: Self-pay | Admitting: Family Medicine

## 2022-12-07 LAB — DRUG SCREEN 10 W/CONF, SERUM
Benzodiazepines, IA: NEGATIVE ng/mL
Phencyclidine, IA: NEGATIVE ng/mL

## 2022-12-07 NOTE — Telephone Encounter (Signed)
PATIENT AWARE

## 2022-12-07 NOTE — Telephone Encounter (Signed)
Pt called stating that the medication that Neale Burly recently prescribed to her is upsetting her stomach and wants to know if anything else can be sent in for her.

## 2022-12-09 LAB — OPIATES,MS,WB/SP RFX
6-Acetylmorphine: NEGATIVE
Codeine: NEGATIVE ng/mL

## 2022-12-09 LAB — DRUG SCREEN 10 W/CONF, SERUM: Amphetamines, IA: NEGATIVE ng/mL

## 2022-12-09 LAB — OXYCODONES,MS,WB/SP RFX
Oxycocone: NEGATIVE ng/mL
Oxycodones Confirmation: NEGATIVE

## 2022-12-10 ENCOUNTER — Encounter: Payer: Self-pay | Admitting: Family Medicine

## 2022-12-10 ENCOUNTER — Ambulatory Visit (INDEPENDENT_AMBULATORY_CARE_PROVIDER_SITE_OTHER): Payer: Medicare HMO | Admitting: Family Medicine

## 2022-12-10 VITALS — BP 129/69 | HR 87 | Temp 98.7°F | Ht 62.0 in | Wt 144.0 lb

## 2022-12-10 DIAGNOSIS — W19XXXD Unspecified fall, subsequent encounter: Secondary | ICD-10-CM

## 2022-12-10 DIAGNOSIS — F411 Generalized anxiety disorder: Secondary | ICD-10-CM | POA: Diagnosis not present

## 2022-12-10 DIAGNOSIS — F330 Major depressive disorder, recurrent, mild: Secondary | ICD-10-CM

## 2022-12-10 DIAGNOSIS — S76311D Strain of muscle, fascia and tendon of the posterior muscle group at thigh level, right thigh, subsequent encounter: Secondary | ICD-10-CM

## 2022-12-10 MED ORDER — DICLOFENAC SODIUM 75 MG PO TBEC
75.0000 mg | DELAYED_RELEASE_TABLET | Freq: Two times a day (BID) | ORAL | 0 refills | Status: DC
Start: 2022-12-10 — End: 2023-01-11

## 2022-12-10 NOTE — Progress Notes (Signed)
Acute Office Visit  Subjective:  Patient ID: Brittany Conrad, female    DOB: 01/14/1951, 72 y.o.   MRN: [id number]  Chief Complaint  Patient presents with   Acute Visit    R-leg pain still no better   HPI Patient is in today for continued pain in her right leg  States that he muscle relaxer helped in the past but states that she did not take it because she was scared of getting dizzy. She states the prednisone did not help her. Reports that she was taking a lot of Advil, motrin, and other medications that hurt her stomach. She is currently on pain medication, but the pain has not gotten any better. Has not tried lidocaine patches or any creams. Has not tried a TENS unit.   ROS As per HPI  Objective:  BP 129/69   Pulse 87   Temp 98.7 F (37.1 C) (Oral)   Ht 5\' 2"  (1.575 m)   Wt 144 lb (65.3 kg)   SpO2 92%   BMI 26.34 kg/m   Physical Exam Constitutional:      General: She is awake. She is not in acute distress.    Appearance: Normal appearance. She is well-developed and well-groomed. She is not ill-appearing, toxic-appearing or diaphoretic.  Cardiovascular:     Rate and Rhythm: Normal rate.     Pulses: Normal pulses.          Radial pulses are 2+ on the right side and 2+ on the left side.       Posterior tibial pulses are 2+ on the right side and 2+ on the left side.     Heart sounds: Normal heart sounds. No murmur heard.    No gallop.  Pulmonary:     Effort: Pulmonary effort is normal. No respiratory distress.     Breath sounds: Normal breath sounds. No stridor. No wheezing, rhonchi or rales.  Musculoskeletal:     Cervical back: Full passive range of motion without pain and neck supple.     Right upper leg: Tenderness present. No swelling, edema, deformity, lacerations or bony tenderness.     Right lower leg: No edema.     Left lower leg: No edema.     Comments: Walks with a cane   Skin:    General: Skin is warm.     Capillary Refill: Capillary refill takes less than 2  seconds.  Neurological:     General: No focal deficit present.     Mental Status: She is alert, oriented to person, place, and time and easily aroused. Mental status is at baseline.     GCS: GCS eye subscore is 4. GCS verbal subscore is 5. GCS motor subscore is 6.     Motor: No weakness.  Psychiatric:        Attention and Perception: Attention and perception normal.        Mood and Affect: Mood and affect normal.        Speech: Speech normal.        Behavior: Behavior normal. Behavior is cooperative.        Thought Content: Thought content normal. Thought content does not include homicidal or suicidal ideation. Thought content does not include homicidal or suicidal plan.        Cognition and Memory: Cognition and memory normal.        Judgment: Judgment normal.       12/10/2022    3:08 PM 12/01/2022    1:38 PM 10/18/2022  2:07 PM 09/01/2022   12:35 PM 09/01/2022   12:31 PM  Depression screen PHQ 2/9  Decreased Interest 1 0 0 2 0  Down, Depressed, Hopeless 0 0 0 0   PHQ - 2 Score 1 0 0 2 0  Altered sleeping 2 2  2    Tired, decreased energy 1 2  0   Change in appetite 0 1  0   Feeling bad or failure about yourself  0 0  1   Trouble concentrating 1 1  0   Moving slowly or fidgety/restless 1 0  1   Suicidal thoughts 0 0  0   PHQ-9 Score 6 6  6    Difficult doing work/chores Somewhat difficult Somewhat difficult  Not difficult at all       12/10/2022    3:08 PM 09/01/2022   12:36 PM 06/30/2022   12:09 PM 04/26/2022    1:44 PM  GAD 7 : Generalized Anxiety Score  Nervous, Anxious, on Edge 2 1 3 2   Control/stop worrying 2 1 2 2   Worry too much - different things 1 1 1 2   Trouble relaxing 2 1 2 2   Restless 2 0 2 1  Easily annoyed or irritable 2 2 2  0  Afraid - awful might happen 1 1 2 2   Total GAD 7 Score 12 7 14 11   Anxiety Difficulty Somewhat difficult Not difficult at all Somewhat difficult Not difficult at all    Assessment & Plan:  1. Strain of right hamstring muscle,  subsequent encounter Will prescribe medication as below for continued pain. Patient does not want to take anything that may cause her to be dizzy. Discussed with patient that any of the muscle relaxers may cause dizziness. Instructed patient to follow up with PCP. Reviewed GFR.  Refused xray at this time.  - diclofenac (VOLTAREN) 75 MG EC tablet; Take 1 tablet (75 mg total) by mouth 2 (two) times daily.  Dispense: 30 tablet; Refill: 0  2. Fall, subsequent encounter Discussed with patient that she is on multiple medications that can cause CNS depression. Reviewed med list with pharmacist, Vanice Sarah before selecting medication as above. Recommend patient follow up with PCP   3. GAD (generalized anxiety disorder) Pt screened positive for depression and anxiety today. Safety contract established today with patient in clinic. Denies intent to harm herself or others. Pt to follow up with PCP  4. MDD (major depressive disorder), recurrent episode, mild (HCC) Pt screened positive for depression and anxiety today. Safety contract established today with patient in clinic. Denies intent to harm herself or others. Pt to follow up with PCP  The above assessment and management plan was discussed with the patient. The patient verbalized understanding of and has agreed to the management plan using shared-decision making. Patient is aware to call the clinic if they develop any new symptoms or if symptoms fail to improve or worsen. Patient is aware when to return to the clinic for a follow-up visit. Patient educated on when it is appropriate to go to the emergency department.   Return if symptoms worsen or fail to improve. Instructed patient to follow up next week with PCP for medication reconciliation for multiple CNS depressants and for close follow up of leg pain.   Neale Burly, DNP-FNP [facility] [address] [place], [place] [address] [phone]

## 2022-12-11 LAB — OPIATES,MS,WB/SP RFX
Dihydrocodeine: 3.6 ng/mL
Hydrocodone: 47 ng/mL
Hydromorphone: NEGATIVE ng/mL
Morphine: NEGATIVE ng/mL
Opiate Confirmation: POSITIVE

## 2022-12-11 LAB — DRUG SCREEN 10 W/CONF, SERUM
Barbiturates, IA: NEGATIVE ug/mL
Cocaine & Metabolite, IA: NEGATIVE ng/mL
Methadone, IA: NEGATIVE ng/mL
Oxycodones, IA: NEGATIVE ng/mL
Propoxyphene, IA: NEGATIVE ng/mL

## 2022-12-11 LAB — THC,MS,WB/SP RFX

## 2022-12-11 LAB — OXYCODONES,MS,WB/SP RFX: Oxymorphone: NEGATIVE ng/mL

## 2022-12-21 ENCOUNTER — Ambulatory Visit (INDEPENDENT_AMBULATORY_CARE_PROVIDER_SITE_OTHER): Payer: Medicare HMO

## 2022-12-21 ENCOUNTER — Ambulatory Visit (INDEPENDENT_AMBULATORY_CARE_PROVIDER_SITE_OTHER): Payer: Medicare HMO | Admitting: Nurse Practitioner

## 2022-12-21 ENCOUNTER — Encounter: Payer: Self-pay | Admitting: Nurse Practitioner

## 2022-12-21 VITALS — BP 125/76 | HR 86 | Temp 98.1°F | Resp 20 | Ht 62.0 in | Wt 140.0 lb

## 2022-12-21 DIAGNOSIS — M25551 Pain in right hip: Secondary | ICD-10-CM

## 2022-12-21 NOTE — Progress Notes (Signed)
Subjective:    Patient ID: Brittany Conrad, female    DOB: [birth year], 72 y.o.   MRN: [id number]   Chief Complaint: Hip Pain (Right hip/), Right ankle swollen, and Cut on left foot   Pt states she would like to have xray done today; this was recommended last time but she states she did not have time that day.  Hip Pain  The incident occurred more than 1 week ago. The incident occurred at home. Injury mechanism: fell into night table. The pain is present in the right hip and right thigh. Quality: sharp. The pain is at a severity of 8/10. The pain has been Constant since onset. Pertinent negatives include no loss of sensation, muscle weakness, numbness or tingling. Associated symptoms comments: Ambulates with cane which she used prior. She reports no foreign bodies present. The symptoms are aggravated by weight bearing and movement. She has tried rest, NSAIDs and non-weight bearing (prescription muscle relaxers) for the symptoms. The treatment provided mild relief.    Patient Active Problem List   Diagnosis Date Noted   DOE (dyspnea on exertion) 05/25/2022   Iron deficiency anemia due to chronic blood loss 09/02/2021   Hypokalemia 09/02/2021   Renal insufficiency 08/21/2021   Hypothyroidism 05/18/2021   Arthralgia of both knees 02/09/2021   Frequent falls 02/09/2021   Urinary tract infection without hematuria 02/09/2021   Cough 12/25/2020   Leukocytosis 11/29/2020   Pneumonia 11/29/2020   Sepsis (HCC) 11/29/2020   Chronic congestive heart failure, unspecified heart failure type (HCC) 10/23/2020   Essential hypertension 10/23/2020   Benzodiazepine dependence, continuous (HCC) 10/23/2020   Chronic pain syndrome 10/23/2020   Acute respiratory failure (HCC) 09/07/2020   MDD (major depressive disorder), recurrent episode, mild (HCC) 09/07/2019   GAD (generalized anxiety disorder) 03/01/2019   Normocytic anemia 04/13/2018   COPD (chronic obstructive pulmonary disease) (HCC) 04/13/2018        Review of Systems  Constitutional:  Negative for fever.  Respiratory:  Negative for shortness of breath.   Cardiovascular:  Negative for chest pain.  Musculoskeletal:        Pain in R hip and thigh  Neurological:  Negative for dizziness, tingling, weakness and numbness.  All other systems reviewed and are negative.      Objective:   Physical Exam Vitals and nursing note reviewed.  Constitutional:      General: She is not in acute distress.    Appearance: Normal appearance. She is not ill-appearing.  Cardiovascular:     Rate and Rhythm: Normal rate and regular rhythm.     Pulses: Normal pulses.     Heart sounds: Normal heart sounds.  Pulmonary:     Effort: Pulmonary effort is normal. No respiratory distress.     Breath sounds: Normal breath sounds. No wheezing, rhonchi or rales.  Musculoskeletal:        General: Swelling present.     Cervical back: Normal range of motion and neck supple.     Right hip: Tenderness present. No deformity, lacerations, bony tenderness or crepitus. Normal range of motion.     Left hip: No deformity, lacerations, tenderness, bony tenderness or crepitus. Normal range of motion.     Right upper leg: Tenderness present. No swelling, edema, deformity, lacerations or bony tenderness.     Left upper leg: Normal.     Right ankle: No deformity. No tenderness. Normal range of motion.     Left ankle: No deformity. No tenderness. Normal range of motion.  Comments: Slight swelling to R ankle  Skin:    General: Skin is warm and dry.     Capillary Refill: Capillary refill takes less than 2 seconds.  Neurological:     General: No focal deficit present.     Mental Status: She is alert and oriented to person, place, and time.     /BP 125/76   Pulse 86   Temp 98.1 F (36.7 C) (Temporal)   Resp 20   Ht 5\' 2"  (1.575 m)   Wt 140 lb (63.5 kg)   SpO2 97%   BMI 25.61 kg/m        Assessment & Plan:   Brittany Conrad in today with chief  complaint of Hip Pain (Right hip/), Right ankle swollen, and Cut on left foot   1. Right hip pain XR negative for fracture Continue current meds Use ice as needed Rest Avoid activities that increase pain - DG HIP UNILAT W OR W/O PELVIS 2-3 VIEWS RIGHT    The above assessment and management plan was discussed with the patient. The patient verbalized understanding of and has agreed to the management plan. Patient is aware to call the clinic if symptoms persist or worsen. Patient is aware when to return to the clinic for a follow-up visit. Patient educated on when it is appropriate to go to the emergency department.   Consuello Bossier, FNP student  Bennie Pierini, FNP

## 2022-12-21 NOTE — Patient Instructions (Signed)
Hip Pain The hip is the joint between the upper legs and the lower pelvis. The bones, cartilage, tendons, and muscles of your hip joint support your body and allow you to move around. Hip pain can range from a minor ache to severe pain in one or both of your hips. The pain may be felt on the inside of the hip joint near the groin, or on the outside near the buttocks and upper thigh. You may also have swelling or stiffness in your hip area. Follow these instructions at home: Managing pain, stiffness, and swelling     If told, put ice on the painful area. Put ice in a plastic bag. Place a towel between your skin and the bag. Leave the ice on for 20 minutes, 2-3 times a day. If told, apply heat to the affected area as often as told by your health care provider. Use the heat source that your provider recommends, such as a moist heat pack or a heating pad. Place a towel between your skin and the heat source. Leave the heat on for 20-30 minutes. If your skin turns bright red, remove the ice or heat right away to prevent skin damage. The risk of damage is higher if you cannot feel pain, heat, or cold. Activity Do exercises as told by your provider. Avoid activities that cause pain. General instructions  Take over-the-counter and prescription medicines only as told by your provider. Keep a journal of your symptoms. Write down: How often you have hip pain. The location of your pain. What the pain feels like. What makes the pain worse. Sleep with a pillow between your legs on your most comfortable side. Keep all follow-up visits. Your provider will monitor your pain and activity. Contact a health care provider if: You cannot put weight on your leg. Your pain or swelling gets worse after a week. It gets harder to walk. You have a fever. Get help right away if: You fall. You have a sudden increase in pain and swelling in your hip. Your hip is red or swollen or very tender to touch. This  information is not intended to replace advice given to you by your health care provider. Make sure you discuss any questions you have with your health care provider. Document Revised: 02/23/2022 Document Reviewed: 02/23/2022 [employer] Patient Education  2024 [employer]  

## 2022-12-27 DIAGNOSIS — M25561 Pain in right knee: Secondary | ICD-10-CM | POA: Diagnosis not present

## 2022-12-27 DIAGNOSIS — M545 Low back pain, unspecified: Secondary | ICD-10-CM | POA: Diagnosis not present

## 2022-12-27 DIAGNOSIS — F1120 Opioid dependence, uncomplicated: Secondary | ICD-10-CM | POA: Diagnosis not present

## 2022-12-27 DIAGNOSIS — G8929 Other chronic pain: Secondary | ICD-10-CM | POA: Diagnosis not present

## 2022-12-27 DIAGNOSIS — F319 Bipolar disorder, unspecified: Secondary | ICD-10-CM | POA: Diagnosis not present

## 2022-12-27 DIAGNOSIS — M25562 Pain in left knee: Secondary | ICD-10-CM | POA: Diagnosis not present

## 2022-12-27 DIAGNOSIS — G609 Hereditary and idiopathic neuropathy, unspecified: Secondary | ICD-10-CM | POA: Diagnosis not present

## 2022-12-27 DIAGNOSIS — J449 Chronic obstructive pulmonary disease, unspecified: Secondary | ICD-10-CM | POA: Diagnosis not present

## 2022-12-31 ENCOUNTER — Other Ambulatory Visit: Payer: Self-pay | Admitting: Family Medicine

## 2022-12-31 DIAGNOSIS — J441 Chronic obstructive pulmonary disease with (acute) exacerbation: Secondary | ICD-10-CM

## 2023-01-31 ENCOUNTER — Ambulatory Visit: Payer: Medicare HMO | Admitting: Family Medicine

## 2023-02-03 DEATH — deceased
